# Patient Record
Sex: Male | Born: 1935 | Race: Black or African American | Hispanic: No | Marital: Married | Smoking: Former smoker
Health system: Southern US, Community
[De-identification: ages and names within clinical notes are randomized; demographics above are authoritative.]

## PROBLEM LIST (undated history)

## (undated) DIAGNOSIS — R972 Elevated prostate specific antigen [PSA]: Secondary | ICD-10-CM

## (undated) DIAGNOSIS — N400 Enlarged prostate without lower urinary tract symptoms: Secondary | ICD-10-CM

## (undated) DIAGNOSIS — N289 Disorder of kidney and ureter, unspecified: Secondary | ICD-10-CM

## (undated) DIAGNOSIS — I10 Essential (primary) hypertension: Secondary | ICD-10-CM

## (undated) DIAGNOSIS — H9190 Unspecified hearing loss, unspecified ear: Secondary | ICD-10-CM

## (undated) DIAGNOSIS — R9431 Abnormal electrocardiogram [ECG] [EKG]: Secondary | ICD-10-CM

## (undated) DIAGNOSIS — E785 Hyperlipidemia, unspecified: Secondary | ICD-10-CM

## (undated) DIAGNOSIS — I2510 Atherosclerotic heart disease of native coronary artery without angina pectoris: Secondary | ICD-10-CM

## (undated) DIAGNOSIS — Z0000 Encounter for general adult medical examination without abnormal findings: Secondary | ICD-10-CM

## (undated) HISTORY — PX: BLADDER SURGERY: SHX569

## (undated) HISTORY — PX: CATARACT EXTRACTION: SUR2

## (undated) HISTORY — DX: Unspecified hearing loss, unspecified ear: H91.90

## (undated) HISTORY — PX: TRANSURETHRAL RESECTION OF PROSTATE: SHX73

## (undated) HISTORY — PX: SHOULDER SURGERY: SHX246

## (undated) HISTORY — PX: COLONOSCOPY: SHX174

## (undated) HISTORY — DX: Elevated prostate specific antigen (PSA): R97.20

## (undated) HISTORY — DX: Abnormal electrocardiogram (ECG) (EKG): R94.31

## (undated) HISTORY — DX: Encounter for general adult medical examination without abnormal findings: Z00.00

## (undated) HISTORY — DX: Hyperlipidemia, unspecified: E78.5

## (undated) HISTORY — PX: TONSILECTOMY, ADENOIDECTOMY, BILATERAL MYRINGOTOMY AND TUBES: SHX2538

## (undated) HISTORY — DX: Benign prostatic hyperplasia without lower urinary tract symptoms: N40.0

## (undated) HISTORY — DX: Disorder of kidney and ureter, unspecified: N28.9

## (undated) HISTORY — PX: OTHER SURGICAL HISTORY: SHX169

## (undated) HISTORY — DX: Atherosclerotic heart disease of native coronary artery without angina pectoris: I25.10

## (undated) HISTORY — DX: Essential (primary) hypertension: I10

---

## 1998-11-14 ENCOUNTER — Other Ambulatory Visit: Admission: RE | Admit: 1998-11-14 | Discharge: 1998-11-14 | Payer: Self-pay | Admitting: Urology

## 1999-01-30 ENCOUNTER — Encounter: Payer: Self-pay | Admitting: Urology

## 1999-01-30 ENCOUNTER — Inpatient Hospital Stay (HOSPITAL_COMMUNITY): Admission: EM | Admit: 1999-01-30 | Discharge: 1999-02-02 | Payer: Self-pay | Admitting: Emergency Medicine

## 1999-02-06 ENCOUNTER — Ambulatory Visit (HOSPITAL_COMMUNITY): Admission: RE | Admit: 1999-02-06 | Discharge: 1999-02-06 | Payer: Self-pay | Admitting: Urology

## 2000-09-09 ENCOUNTER — Other Ambulatory Visit: Admission: RE | Admit: 2000-09-09 | Discharge: 2000-09-09 | Payer: Self-pay | Admitting: Urology

## 2000-09-09 ENCOUNTER — Encounter (INDEPENDENT_AMBULATORY_CARE_PROVIDER_SITE_OTHER): Payer: Self-pay | Admitting: Specialist

## 2000-12-28 ENCOUNTER — Encounter: Payer: Self-pay | Admitting: Cardiology

## 2000-12-28 ENCOUNTER — Ambulatory Visit (HOSPITAL_COMMUNITY): Admission: RE | Admit: 2000-12-28 | Discharge: 2000-12-28 | Payer: Self-pay | Admitting: Cardiology

## 2004-03-18 ENCOUNTER — Inpatient Hospital Stay (HOSPITAL_COMMUNITY): Admission: AD | Admit: 2004-03-18 | Discharge: 2004-03-20 | Payer: Self-pay | Admitting: Cardiology

## 2004-03-19 ENCOUNTER — Encounter: Payer: Self-pay | Admitting: Cardiology

## 2004-04-07 ENCOUNTER — Encounter: Admission: RE | Admit: 2004-04-07 | Discharge: 2004-04-07 | Payer: Self-pay | Admitting: Family Medicine

## 2004-11-06 ENCOUNTER — Ambulatory Visit: Payer: Self-pay | Admitting: Family Medicine

## 2004-11-24 ENCOUNTER — Ambulatory Visit: Payer: Self-pay | Admitting: Family Medicine

## 2004-12-08 ENCOUNTER — Ambulatory Visit: Payer: Self-pay | Admitting: Family Medicine

## 2004-12-22 ENCOUNTER — Ambulatory Visit: Payer: Self-pay | Admitting: Family Medicine

## 2005-01-05 ENCOUNTER — Ambulatory Visit: Payer: Self-pay | Admitting: Family Medicine

## 2005-01-12 ENCOUNTER — Ambulatory Visit: Payer: Self-pay | Admitting: Family Medicine

## 2005-01-25 ENCOUNTER — Ambulatory Visit: Payer: Self-pay | Admitting: Family Medicine

## 2005-02-11 ENCOUNTER — Ambulatory Visit: Payer: Self-pay | Admitting: Family Medicine

## 2005-06-15 ENCOUNTER — Ambulatory Visit: Payer: Self-pay | Admitting: Family Medicine

## 2005-06-29 ENCOUNTER — Ambulatory Visit: Payer: Self-pay | Admitting: Family Medicine

## 2005-08-24 ENCOUNTER — Ambulatory Visit: Payer: Self-pay | Admitting: Internal Medicine

## 2006-01-09 ENCOUNTER — Emergency Department (HOSPITAL_COMMUNITY): Admission: EM | Admit: 2006-01-09 | Discharge: 2006-01-10 | Payer: Self-pay | Admitting: Emergency Medicine

## 2006-01-09 ENCOUNTER — Ambulatory Visit: Payer: Self-pay | Admitting: Internal Medicine

## 2006-01-11 ENCOUNTER — Ambulatory Visit: Payer: Self-pay | Admitting: Family Medicine

## 2006-01-12 ENCOUNTER — Encounter: Payer: Self-pay | Admitting: Urology

## 2006-01-18 ENCOUNTER — Encounter: Admission: RE | Admit: 2006-01-18 | Discharge: 2006-01-18 | Payer: Self-pay | Admitting: Family Medicine

## 2006-01-25 ENCOUNTER — Ambulatory Visit: Payer: Self-pay | Admitting: Family Medicine

## 2006-02-02 ENCOUNTER — Ambulatory Visit: Payer: Self-pay | Admitting: Internal Medicine

## 2006-02-03 ENCOUNTER — Ambulatory Visit: Payer: Self-pay | Admitting: Cardiology

## 2006-02-14 ENCOUNTER — Ambulatory Visit: Payer: Self-pay

## 2006-04-27 ENCOUNTER — Ambulatory Visit: Payer: Self-pay | Admitting: Family Medicine

## 2006-05-18 ENCOUNTER — Ambulatory Visit: Payer: Self-pay | Admitting: Family Medicine

## 2006-06-01 ENCOUNTER — Ambulatory Visit: Payer: Self-pay | Admitting: Family Medicine

## 2006-08-30 ENCOUNTER — Ambulatory Visit: Payer: Self-pay | Admitting: Family Medicine

## 2006-09-20 ENCOUNTER — Ambulatory Visit: Payer: Self-pay | Admitting: Family Medicine

## 2006-10-11 ENCOUNTER — Ambulatory Visit: Payer: Self-pay | Admitting: Family Medicine

## 2006-10-11 LAB — CONVERTED CEMR LAB
ALT: 12 units/L (ref 0–40)
Albumin: 3.3 g/dL — ABNORMAL LOW (ref 3.5–5.2)
BUN: 14 mg/dL (ref 6–23)
Calcium: 8.8 mg/dL (ref 8.4–10.5)
Creatinine, Ser: 1.4 mg/dL (ref 0.4–1.5)
GFR calc non Af Amer: 53 mL/min
Sodium: 140 meq/L (ref 135–145)
Total Protein: 7.1 g/dL (ref 6.0–8.3)

## 2006-10-18 ENCOUNTER — Ambulatory Visit: Payer: Self-pay | Admitting: Family Medicine

## 2006-10-18 LAB — CONVERTED CEMR LAB
Calcium: 9.1 mg/dL (ref 8.4–10.5)
Chloride: 109 meq/L (ref 96–112)
Creatinine, Ser: 1.4 mg/dL (ref 0.4–1.5)
GFR calc Af Amer: 64 mL/min
GFR calc non Af Amer: 53 mL/min
Hgb A1c MFr Bld: 5.2 % (ref 4.6–6.0)
Sodium: 141 meq/L (ref 135–145)

## 2006-12-08 DIAGNOSIS — I10 Essential (primary) hypertension: Secondary | ICD-10-CM | POA: Insufficient documentation

## 2006-12-08 DIAGNOSIS — IMO0002 Reserved for concepts with insufficient information to code with codable children: Secondary | ICD-10-CM | POA: Insufficient documentation

## 2006-12-08 DIAGNOSIS — H9190 Unspecified hearing loss, unspecified ear: Secondary | ICD-10-CM | POA: Insufficient documentation

## 2006-12-08 DIAGNOSIS — Z9189 Other specified personal risk factors, not elsewhere classified: Secondary | ICD-10-CM

## 2006-12-08 DIAGNOSIS — E785 Hyperlipidemia, unspecified: Secondary | ICD-10-CM

## 2006-12-08 DIAGNOSIS — N400 Enlarged prostate without lower urinary tract symptoms: Secondary | ICD-10-CM

## 2006-12-08 HISTORY — DX: Benign prostatic hyperplasia without lower urinary tract symptoms: N40.0

## 2006-12-08 HISTORY — DX: Unspecified hearing loss, unspecified ear: H91.90

## 2006-12-08 HISTORY — DX: Hyperlipidemia, unspecified: E78.5

## 2006-12-08 HISTORY — DX: Essential (primary) hypertension: I10

## 2006-12-29 ENCOUNTER — Encounter: Payer: Self-pay | Admitting: Family Medicine

## 2006-12-29 ENCOUNTER — Ambulatory Visit: Payer: Self-pay | Admitting: Family Medicine

## 2006-12-29 DIAGNOSIS — H409 Unspecified glaucoma: Secondary | ICD-10-CM | POA: Insufficient documentation

## 2006-12-29 HISTORY — DX: Unspecified glaucoma: H40.9

## 2006-12-29 LAB — CONVERTED CEMR LAB
ALT: 19 units/L (ref 0–40)
Albumin: 3.6 g/dL (ref 3.5–5.2)
Alkaline Phosphatase: 71 units/L (ref 39–117)
Basophils Relative: 0.9 % (ref 0.0–1.0)
Chloride: 106 meq/L (ref 96–112)
Cholesterol: 169 mg/dL (ref 0–200)
Direct LDL: 64.2 mg/dL
Eosinophils Absolute: 0.1 10*3/uL (ref 0.0–0.6)
Eosinophils Relative: 1.6 % (ref 0.0–5.0)
GFR calc Af Amer: 77 mL/min
GFR calc non Af Amer: 63 mL/min
Glucose, Bld: 99 mg/dL (ref 70–99)
HCT: 38.4 % — ABNORMAL LOW (ref 39.0–52.0)
Lymphocytes Relative: 25.7 % (ref 12.0–46.0)
Monocytes Absolute: 0.7 10*3/uL (ref 0.2–0.7)
Neutro Abs: 5.4 10*3/uL (ref 1.4–7.7)
Platelets: 251 10*3/uL (ref 150–400)
RBC: 4.25 M/uL (ref 4.22–5.81)
RDW: 12.5 % (ref 11.5–14.6)
TSH: 1.57 microintl units/mL (ref 0.35–5.50)
Total Bilirubin: 1.1 mg/dL (ref 0.3–1.2)
Total Protein: 7.5 g/dL (ref 6.0–8.3)
VLDL: 77 mg/dL — ABNORMAL HIGH (ref 0–40)

## 2007-01-02 ENCOUNTER — Encounter: Payer: Self-pay | Admitting: Family Medicine

## 2007-01-05 ENCOUNTER — Ambulatory Visit: Payer: Self-pay | Admitting: Family Medicine

## 2007-01-13 ENCOUNTER — Ambulatory Visit: Payer: Self-pay | Admitting: Cardiology

## 2007-01-20 ENCOUNTER — Ambulatory Visit: Payer: Self-pay

## 2007-01-30 DIAGNOSIS — Z8719 Personal history of other diseases of the digestive system: Secondary | ICD-10-CM | POA: Insufficient documentation

## 2007-04-04 ENCOUNTER — Ambulatory Visit: Payer: Self-pay | Admitting: Family Medicine

## 2007-04-07 DIAGNOSIS — N259 Disorder resulting from impaired renal tubular function, unspecified: Secondary | ICD-10-CM

## 2007-04-07 HISTORY — DX: Disorder resulting from impaired renal tubular function, unspecified: N25.9

## 2007-04-07 LAB — CONVERTED CEMR LAB
ALT: 12 units/L (ref 0–53)
Albumin: 3.8 g/dL (ref 3.5–5.2)
CO2: 24 meq/L (ref 19–32)
Cholesterol: 157 mg/dL (ref 0–200)
GFR calc Af Amer: 40 mL/min
GFR calc non Af Amer: 33 mL/min
Glucose, Bld: 96 mg/dL (ref 70–99)
LDL Cholesterol: 103 mg/dL — ABNORMAL HIGH (ref 0–99)
Potassium: 4 meq/L (ref 3.5–5.1)
Total Bilirubin: 1 mg/dL (ref 0.3–1.2)
Total Protein: 7.4 g/dL (ref 6.0–8.3)
VLDL: 25 mg/dL (ref 0–40)

## 2007-04-14 ENCOUNTER — Telehealth (INDEPENDENT_AMBULATORY_CARE_PROVIDER_SITE_OTHER): Payer: Self-pay | Admitting: *Deleted

## 2007-04-21 ENCOUNTER — Ambulatory Visit: Payer: Self-pay | Admitting: Family Medicine

## 2007-04-25 LAB — CONVERTED CEMR LAB
Chloride: 107 meq/L (ref 96–112)
Creatinine, Ser: 2.33 mg/dL — ABNORMAL HIGH (ref 0.40–1.50)
Sodium: 141 meq/L (ref 135–145)

## 2007-05-05 ENCOUNTER — Ambulatory Visit: Payer: Self-pay | Admitting: Family Medicine

## 2007-05-17 ENCOUNTER — Encounter (INDEPENDENT_AMBULATORY_CARE_PROVIDER_SITE_OTHER): Payer: Self-pay | Admitting: *Deleted

## 2007-06-08 ENCOUNTER — Encounter: Payer: Self-pay | Admitting: Family Medicine

## 2007-06-30 ENCOUNTER — Ambulatory Visit: Payer: Self-pay | Admitting: Family Medicine

## 2007-07-30 ENCOUNTER — Emergency Department (HOSPITAL_COMMUNITY): Admission: EM | Admit: 2007-07-30 | Discharge: 2007-07-31 | Payer: Self-pay | Admitting: Emergency Medicine

## 2007-08-03 ENCOUNTER — Ambulatory Visit: Payer: Self-pay | Admitting: Family Medicine

## 2007-10-09 ENCOUNTER — Ambulatory Visit: Payer: Self-pay | Admitting: Family Medicine

## 2007-10-17 LAB — CONVERTED CEMR LAB
ALT: 11 units/L (ref 0–53)
Alkaline Phosphatase: 37 units/L — ABNORMAL LOW (ref 39–117)
Bilirubin, Direct: 0.1 mg/dL (ref 0.0–0.3)
Direct LDL: 170.2 mg/dL
Total Protein: 7.5 g/dL (ref 6.0–8.3)

## 2007-12-18 ENCOUNTER — Telehealth (INDEPENDENT_AMBULATORY_CARE_PROVIDER_SITE_OTHER): Payer: Self-pay | Admitting: *Deleted

## 2008-01-15 ENCOUNTER — Ambulatory Visit: Payer: Self-pay | Admitting: Family Medicine

## 2008-01-15 LAB — CONVERTED CEMR LAB
ALT: 13 units/L (ref 0–53)
AST: 19 units/L (ref 0–37)
Alkaline Phosphatase: 40 units/L (ref 39–117)
Bilirubin, Direct: 0.1 mg/dL (ref 0.0–0.3)
Cholesterol: 141 mg/dL (ref 0–200)
HDL: 32.6 mg/dL — ABNORMAL LOW (ref >39.0)
LDL Cholesterol: 93 mg/dL (ref 0–99)
Total CHOL/HDL Ratio: 4.3
Triglycerides: 79 mg/dL (ref 0–149)
VLDL: 16 mg/dL (ref 0–40)

## 2008-01-19 ENCOUNTER — Encounter: Payer: Self-pay | Admitting: Internal Medicine

## 2008-01-23 ENCOUNTER — Encounter (INDEPENDENT_AMBULATORY_CARE_PROVIDER_SITE_OTHER): Payer: Self-pay | Admitting: *Deleted

## 2008-01-31 ENCOUNTER — Ambulatory Visit: Payer: Self-pay | Admitting: Cardiology

## 2008-04-29 ENCOUNTER — Ambulatory Visit: Payer: Self-pay | Admitting: Family Medicine

## 2008-04-29 DIAGNOSIS — M25559 Pain in unspecified hip: Secondary | ICD-10-CM | POA: Insufficient documentation

## 2008-04-29 HISTORY — DX: Pain in unspecified hip: M25.559

## 2008-05-03 ENCOUNTER — Ambulatory Visit: Payer: Self-pay | Admitting: Vascular Surgery

## 2008-05-03 ENCOUNTER — Encounter (INDEPENDENT_AMBULATORY_CARE_PROVIDER_SITE_OTHER): Payer: Self-pay | Admitting: Emergency Medicine

## 2008-05-03 ENCOUNTER — Emergency Department (HOSPITAL_COMMUNITY): Admission: EM | Admit: 2008-05-03 | Discharge: 2008-05-03 | Payer: Self-pay | Admitting: Emergency Medicine

## 2008-05-11 LAB — CONVERTED CEMR LAB
Albumin: 4.2 g/dL (ref 3.5–5.2)
Alkaline Phosphatase: 38 units/L — ABNORMAL LOW (ref 39–117)
BUN: 27 mg/dL — ABNORMAL HIGH (ref 6–23)
Bilirubin, Direct: 0.1 mg/dL (ref 0.0–0.3)
CO2: 24 meq/L (ref 19–32)
Calcium: 9.3 mg/dL (ref 8.4–10.5)
Chloride: 110 meq/L (ref 96–112)
Creatinine, Ser: 1.6 mg/dL — ABNORMAL HIGH (ref 0.4–1.5)
GFR calc Af Amer: 55 mL/min
GFR calc non Af Amer: 45 mL/min
LDL Cholesterol: 87 mg/dL (ref 0–99)
Potassium: 3.8 meq/L (ref 3.5–5.1)
Total Bilirubin: 0.9 mg/dL (ref 0.3–1.2)

## 2008-05-13 ENCOUNTER — Encounter (INDEPENDENT_AMBULATORY_CARE_PROVIDER_SITE_OTHER): Payer: Self-pay | Admitting: *Deleted

## 2008-05-16 ENCOUNTER — Ambulatory Visit: Payer: Self-pay | Admitting: Family Medicine

## 2008-06-06 ENCOUNTER — Ambulatory Visit: Payer: Self-pay | Admitting: Family Medicine

## 2008-06-18 ENCOUNTER — Telehealth (INDEPENDENT_AMBULATORY_CARE_PROVIDER_SITE_OTHER): Payer: Self-pay | Admitting: *Deleted

## 2008-06-19 ENCOUNTER — Ambulatory Visit: Payer: Self-pay | Admitting: Family Medicine

## 2008-06-19 DIAGNOSIS — R634 Abnormal weight loss: Secondary | ICD-10-CM | POA: Insufficient documentation

## 2008-06-19 HISTORY — DX: Abnormal weight loss: R63.4

## 2008-06-19 LAB — CONVERTED CEMR LAB
Bilirubin Urine: NEGATIVE
Nitrite: NEGATIVE
Specific Gravity, Urine: 1.025
WBC Urine, dipstick: NEGATIVE
pH: 5

## 2008-06-27 LAB — CONVERTED CEMR LAB
Basophils Absolute: 0 10*3/uL (ref 0.0–0.1)
CO2: 26 meq/L (ref 19–32)
Chloride: 110 meq/L (ref 96–112)
Eosinophils Absolute: 0.2 10*3/uL (ref 0.0–0.7)
Eosinophils Relative: 2.2 % (ref 0.0–5.0)
Glucose, Bld: 117 mg/dL — ABNORMAL HIGH (ref 70–99)
Hemoglobin: 12.8 g/dL — ABNORMAL LOW (ref 13.0–17.0)
MCHC: 34.5 g/dL (ref 30.0–36.0)
Monocytes Absolute: 0.8 10*3/uL (ref 0.1–1.0)
Neutro Abs: 6.9 10*3/uL (ref 1.4–7.7)
Neutrophils Relative %: 68.3 % (ref 43.0–77.0)
Potassium: 4 meq/L (ref 3.5–5.1)
RBC: 3.99 M/uL — ABNORMAL LOW (ref 4.22–5.81)
RDW: 12.8 % (ref 11.5–14.6)
TSH: 1.99 microintl units/mL (ref 0.35–5.50)
Vitamin B-12: 395 pg/mL (ref 211–911)

## 2008-06-28 ENCOUNTER — Telehealth (INDEPENDENT_AMBULATORY_CARE_PROVIDER_SITE_OTHER): Payer: Self-pay | Admitting: *Deleted

## 2008-06-28 ENCOUNTER — Encounter (INDEPENDENT_AMBULATORY_CARE_PROVIDER_SITE_OTHER): Payer: Self-pay | Admitting: *Deleted

## 2008-08-02 ENCOUNTER — Ambulatory Visit: Payer: Self-pay | Admitting: Family Medicine

## 2008-09-09 ENCOUNTER — Telehealth (INDEPENDENT_AMBULATORY_CARE_PROVIDER_SITE_OTHER): Payer: Self-pay | Admitting: *Deleted

## 2008-09-10 ENCOUNTER — Telehealth (INDEPENDENT_AMBULATORY_CARE_PROVIDER_SITE_OTHER): Payer: Self-pay | Admitting: *Deleted

## 2008-09-18 ENCOUNTER — Telehealth (INDEPENDENT_AMBULATORY_CARE_PROVIDER_SITE_OTHER): Payer: Self-pay | Admitting: *Deleted

## 2008-11-01 ENCOUNTER — Ambulatory Visit: Payer: Self-pay | Admitting: Family Medicine

## 2008-11-08 ENCOUNTER — Ambulatory Visit: Payer: Self-pay | Admitting: Family Medicine

## 2008-11-14 ENCOUNTER — Ambulatory Visit: Payer: Self-pay | Admitting: Family Medicine

## 2008-12-03 LAB — CONVERTED CEMR LAB
ALT: 9 units/L (ref 0–53)
Albumin: 3.4 g/dL — ABNORMAL LOW (ref 3.5–5.2)
Calcium: 9.3 mg/dL (ref 8.4–10.5)
Chloride: 109 meq/L (ref 96–112)
Cholesterol: 140 mg/dL (ref 0–200)
Creatinine, Ser: 1.9 mg/dL — ABNORMAL HIGH (ref 0.4–1.5)
GFR calc Af Amer: 45 mL/min
GFR calc non Af Amer: 37 mL/min
HDL: 32.5 mg/dL — ABNORMAL LOW (ref >39.0)
Potassium: 3.8 meq/L (ref 3.5–5.1)
Triglycerides: 106 mg/dL (ref 0–149)
VLDL: 21 mg/dL (ref 0–40)

## 2008-12-04 ENCOUNTER — Encounter (INDEPENDENT_AMBULATORY_CARE_PROVIDER_SITE_OTHER): Payer: Self-pay | Admitting: *Deleted

## 2009-04-10 ENCOUNTER — Ambulatory Visit: Payer: Self-pay | Admitting: Cardiology

## 2009-04-10 DIAGNOSIS — R9431 Abnormal electrocardiogram [ECG] [EKG]: Secondary | ICD-10-CM

## 2009-04-10 HISTORY — DX: Abnormal electrocardiogram (ECG) (EKG): R94.31

## 2009-04-15 ENCOUNTER — Telehealth (INDEPENDENT_AMBULATORY_CARE_PROVIDER_SITE_OTHER): Payer: Self-pay | Admitting: *Deleted

## 2009-04-16 ENCOUNTER — Encounter: Payer: Self-pay | Admitting: Cardiology

## 2009-04-16 ENCOUNTER — Ambulatory Visit: Payer: Self-pay

## 2009-04-21 ENCOUNTER — Encounter (INDEPENDENT_AMBULATORY_CARE_PROVIDER_SITE_OTHER): Payer: Self-pay | Admitting: *Deleted

## 2009-05-02 ENCOUNTER — Ambulatory Visit: Payer: Self-pay | Admitting: Family Medicine

## 2009-05-07 ENCOUNTER — Ambulatory Visit: Payer: Self-pay | Admitting: Family Medicine

## 2009-05-12 LAB — CONVERTED CEMR LAB
ALT: 12 units/L (ref 0–53)
AST: 21 units/L (ref 0–37)
Alkaline Phosphatase: 36 units/L — ABNORMAL LOW (ref 39–117)
Bilirubin, Direct: 0 mg/dL (ref 0.0–0.3)
CO2: 27 meq/L (ref 19–32)
Chloride: 113 meq/L — ABNORMAL HIGH (ref 96–112)
LDL Cholesterol: 102 mg/dL — ABNORMAL HIGH (ref 0–99)
Total Bilirubin: 0.8 mg/dL (ref 0.3–1.2)
Total CHOL/HDL Ratio: 4
Total Protein: 7.7 g/dL (ref 6.0–8.3)
Triglycerides: 98 mg/dL (ref 0.0–149.0)

## 2009-08-27 ENCOUNTER — Telehealth (INDEPENDENT_AMBULATORY_CARE_PROVIDER_SITE_OTHER): Payer: Self-pay | Admitting: *Deleted

## 2009-09-01 ENCOUNTER — Telehealth (INDEPENDENT_AMBULATORY_CARE_PROVIDER_SITE_OTHER): Payer: Self-pay | Admitting: *Deleted

## 2009-09-09 ENCOUNTER — Telehealth (INDEPENDENT_AMBULATORY_CARE_PROVIDER_SITE_OTHER): Payer: Self-pay | Admitting: *Deleted

## 2009-09-22 ENCOUNTER — Telehealth (INDEPENDENT_AMBULATORY_CARE_PROVIDER_SITE_OTHER): Payer: Self-pay | Admitting: *Deleted

## 2009-10-06 ENCOUNTER — Telehealth (INDEPENDENT_AMBULATORY_CARE_PROVIDER_SITE_OTHER): Payer: Self-pay | Admitting: *Deleted

## 2009-10-24 ENCOUNTER — Telehealth (INDEPENDENT_AMBULATORY_CARE_PROVIDER_SITE_OTHER): Payer: Self-pay | Admitting: *Deleted

## 2009-11-06 ENCOUNTER — Telehealth (INDEPENDENT_AMBULATORY_CARE_PROVIDER_SITE_OTHER): Payer: Self-pay | Admitting: *Deleted

## 2009-11-24 ENCOUNTER — Ambulatory Visit: Payer: Self-pay | Admitting: Family Medicine

## 2009-11-24 DIAGNOSIS — D239 Other benign neoplasm of skin, unspecified: Secondary | ICD-10-CM | POA: Insufficient documentation

## 2009-11-26 ENCOUNTER — Ambulatory Visit: Payer: Self-pay | Admitting: Family Medicine

## 2009-12-06 DIAGNOSIS — R972 Elevated prostate specific antigen [PSA]: Secondary | ICD-10-CM | POA: Insufficient documentation

## 2009-12-08 ENCOUNTER — Telehealth (INDEPENDENT_AMBULATORY_CARE_PROVIDER_SITE_OTHER): Payer: Self-pay | Admitting: *Deleted

## 2009-12-08 LAB — CONVERTED CEMR LAB
ALT: 12 units/L (ref 0–53)
AST: 20 units/L (ref 0–37)
Basophils Relative: 0.7 % (ref 0.0–3.0)
Bilirubin, Direct: 0.1 mg/dL (ref 0.0–0.3)
CO2: 26 meq/L (ref 19–32)
Chloride: 114 meq/L — ABNORMAL HIGH (ref 96–112)
Cholesterol: 144 mg/dL (ref 0–200)
Creatinine, Ser: 1.5 mg/dL (ref 0.4–1.5)
GFR calc non Af Amer: 58.54 mL/min (ref >60)
Glucose, Bld: 101 mg/dL — ABNORMAL HIGH (ref 70–99)
HCT: 39.5 % (ref 39.0–52.0)
HDL: 45.3 mg/dL (ref >39.00)
LDL Cholesterol: 74 mg/dL (ref 0–99)
Monocytes Absolute: 0.8 10*3/uL (ref 0.1–1.0)
Monocytes Relative: 10.2 % (ref 3.0–12.0)
PSA: 10.02 ng/mL — ABNORMAL HIGH (ref 0.10–4.00)
Total Protein: 8.2 g/dL (ref 6.0–8.3)
Triglycerides: 124 mg/dL (ref 0.0–149.0)
VLDL: 24.8 mg/dL (ref 0.0–40.0)
WBC: 7.6 10*3/uL (ref 4.5–10.5)

## 2009-12-16 ENCOUNTER — Encounter: Payer: Self-pay | Admitting: Family Medicine

## 2010-01-13 ENCOUNTER — Emergency Department (HOSPITAL_COMMUNITY): Admission: EM | Admit: 2010-01-13 | Discharge: 2010-01-13 | Payer: Self-pay | Admitting: Emergency Medicine

## 2010-03-13 HISTORY — PX: CHOLECYSTECTOMY: SHX55

## 2010-04-07 DIAGNOSIS — I2510 Atherosclerotic heart disease of native coronary artery without angina pectoris: Secondary | ICD-10-CM | POA: Insufficient documentation

## 2010-04-07 HISTORY — DX: Atherosclerotic heart disease of native coronary artery without angina pectoris: I25.10

## 2010-04-09 ENCOUNTER — Ambulatory Visit: Payer: Self-pay | Admitting: Cardiology

## 2010-05-19 ENCOUNTER — Ambulatory Visit: Payer: Self-pay | Admitting: Family Medicine

## 2010-05-19 DIAGNOSIS — R109 Unspecified abdominal pain: Secondary | ICD-10-CM | POA: Insufficient documentation

## 2010-05-19 DIAGNOSIS — R143 Flatulence: Secondary | ICD-10-CM

## 2010-05-19 DIAGNOSIS — R141 Gas pain: Secondary | ICD-10-CM

## 2010-05-19 DIAGNOSIS — R142 Eructation: Secondary | ICD-10-CM

## 2010-05-19 HISTORY — DX: Unspecified abdominal pain: R10.9

## 2010-05-19 LAB — CONVERTED CEMR LAB
Glucose, Urine, Semiquant: NEGATIVE
Nitrite: NEGATIVE
Protein, U semiquant: 100
WBC Urine, dipstick: NEGATIVE
pH: 5

## 2010-05-20 ENCOUNTER — Encounter: Payer: Self-pay | Admitting: Family Medicine

## 2010-05-20 ENCOUNTER — Encounter (INDEPENDENT_AMBULATORY_CARE_PROVIDER_SITE_OTHER): Payer: Self-pay | Admitting: *Deleted

## 2010-05-20 LAB — CONVERTED CEMR LAB
AST: 17 units/L (ref 0–37)
Alkaline Phosphatase: 31 units/L — ABNORMAL LOW (ref 39–117)
Amylase: 63 units/L (ref 27–131)
Bilirubin, Direct: 0.2 mg/dL (ref 0.0–0.3)
Calcium: 8.8 mg/dL (ref 8.4–10.5)
Creatinine, Ser: 1.7 mg/dL — ABNORMAL HIGH (ref 0.4–1.5)
GFR calc non Af Amer: 49.92 mL/min (ref >60)
Glucose, Bld: 75 mg/dL (ref 70–99)
HCT: 34.4 % — ABNORMAL LOW (ref 39.0–52.0)
Hemoglobin: 11.9 g/dL — ABNORMAL LOW (ref 13.0–17.0)
MCHC: 34.5 g/dL (ref 30.0–36.0)
Monocytes Absolute: 0.7 10*3/uL (ref 0.1–1.0)
Monocytes Relative: 8.4 % (ref 3.0–12.0)
Neutrophils Relative %: 65.6 % (ref 43.0–77.0)
Platelets: 259 10*3/uL (ref 150.0–400.0)

## 2010-05-22 ENCOUNTER — Encounter: Admission: RE | Admit: 2010-05-22 | Discharge: 2010-05-22 | Payer: Self-pay | Admitting: Family Medicine

## 2010-05-22 DIAGNOSIS — K810 Acute cholecystitis: Secondary | ICD-10-CM

## 2010-05-22 HISTORY — DX: Acute cholecystitis: K81.0

## 2010-05-25 ENCOUNTER — Ambulatory Visit: Payer: Self-pay | Admitting: Family Medicine

## 2010-05-26 ENCOUNTER — Telehealth: Payer: Self-pay | Admitting: Cardiology

## 2010-05-26 ENCOUNTER — Encounter: Payer: Self-pay | Admitting: Family Medicine

## 2010-05-28 LAB — CONVERTED CEMR LAB
BUN: 21 mg/dL (ref 6–23)
Calcium: 9.5 mg/dL (ref 8.4–10.5)
Chloride: 108 meq/L (ref 96–112)
Creatinine, Ser: 1.7 mg/dL — ABNORMAL HIGH (ref 0.4–1.5)
GFR calc non Af Amer: 51.29 mL/min (ref >60)

## 2010-05-29 ENCOUNTER — Encounter: Payer: Self-pay | Admitting: Cardiology

## 2010-06-23 ENCOUNTER — Encounter: Payer: Self-pay | Admitting: Family Medicine

## 2010-06-26 ENCOUNTER — Telehealth (INDEPENDENT_AMBULATORY_CARE_PROVIDER_SITE_OTHER): Payer: Self-pay | Admitting: *Deleted

## 2010-06-30 ENCOUNTER — Encounter (INDEPENDENT_AMBULATORY_CARE_PROVIDER_SITE_OTHER): Payer: Self-pay | Admitting: General Surgery

## 2010-06-30 ENCOUNTER — Ambulatory Visit (HOSPITAL_COMMUNITY): Admission: RE | Admit: 2010-06-30 | Discharge: 2010-07-01 | Payer: Self-pay | Admitting: General Surgery

## 2010-07-21 ENCOUNTER — Encounter: Payer: Self-pay | Admitting: Family Medicine

## 2010-10-13 NOTE — Consult Note (Signed)
Summary: [facility]   Imported By: Lanelle Bal 12/23/2009 10:58:53  _____________________________________________________________________  External Attachment:    Type:   Image     Comment:   External Document

## 2010-10-13 NOTE — Progress Notes (Signed)
Summary: REFILL  Phone Note Refill Request Message from:  Fax from Pharmacy on [address] FAX [phone]  Refills Requested: Medication #1:  CARDIZEM LA 420 MG  TB24 1 by mouth once daily [BMN] Initial call taken by: Barb Merino,  September 22, 2009 12:16 PM    Prescriptions: CARDIZEM LA 420 MG  TB24 (DILTIAZEM HCL COATED BEADS) 1 by mouth once daily Brand medically necessary #30 Each x 0   Entered by:   Kandice Hams   Authorized by:   Loreen Freud DO   Signed by:   Kandice Hams on 09/22/2009   Method used:   Faxed to ...       [facility]* (retail)       [address] [place], [place]  [address]       Ph: [phone]       Fax: [phone]   RxID:   [id number]

## 2010-10-13 NOTE — Letter (Signed)
Summary: Clearance Letter  [facility], Main Office  [address]   [place], [place] [address]   Phone: [phone]  Fax: [phone]    May 29, 2010  Re:     [name] Address:   [address] [place], [place]  [address] DOB:     18-Jun-1932 MRN:     [id number]   Dear [name]  [name] is cleared from a cardiac standpoint for surgery.  He will need to stop his Coumadin 5 days prior to surgery. For any questions please feel free to contact us at [phone].         Sincerely,     Dr. Valera Castle Lisabeth Devoid RN

## 2010-10-13 NOTE — Progress Notes (Signed)
Summary: REFILL  Phone Note Refill Request Message from:  Fax from Pharmacy on November 06, 2009 12:55 PM  Refills Requested: Medication #1:  CLONIDINE HCL 0.1 MG TABS 1 by mouth two times a day [facility] FAX [phone]   Method Requested: Fax to Local Pharmacy Next Appointment Scheduled: NO APPT Initial call taken by: Barb Merino,  November 06, 2009 1:00 PM    New/Updated Medications: CLONIDINE HCL 0.1 MG TABS (CLONIDINE HCL) 1 by mouth two times a day. NEEDS OFFICE VISIT BEFORE ADDITONAL REFILLS. Prescriptions: CLONIDINE HCL 0.1 MG TABS (CLONIDINE HCL) 1 by mouth two times a day. NEEDS OFFICE VISIT BEFORE ADDITONAL REFILLS.  #60 x 0   Entered by:   Army Fossa CMA   Authorized by:   Loreen Freud DO   Signed by:   Army Fossa CMA on 11/06/2009   Method used:   Electronically to        [facility]* (retail)       [address] [place], [place]  [address]       Ph: [phone]       Fax: [phone]   RxID:   [id number]

## 2010-10-13 NOTE — Progress Notes (Signed)
Summary: REFILL  Phone Note Refill Request Message from:  Fax from Pharmacy on [address] G[address] [phone]  Refills Requested: Medication #1:  ANTARA 130 MG  CAPS 1 by mouth once daily Initial call taken by: Barb Merino,  October 06, 2009 9:16 AM    Prescriptions: Lestine Mount 130 MG  CAPS (FENOFIBRATE MICRONIZED) 1 by mouth once daily  #30 Each x 1   Entered by:   Army Fossa CMA   Authorized by:   Loreen Freud DO   Signed by:   Army Fossa CMA on 10/06/2009   Method used:   Electronically to        [facility] [address] (retail)       [phone]       [place], [place]  [address]       Ph: [phone]       Fax: [phone]   RxID:   [id number]

## 2010-10-13 NOTE — Assessment & Plan Note (Signed)
Summary: yearly/sl  Medications Added ASPIRIN 81 MG TBEC (ASPIRIN) as needed pt states he takes ASA q 2 months PREDNISOLONE ACETATE 1 % SUSP (PREDNISOLONE ACETATE) 1 gtt ou once daily        Visit Type:  1 yr f/u Primary Provider:  Laury Axon  CC:  pt states he recently had some bilateral arm pain this persisted on and off x3 weeks...no other complaints today.  History of Present Illness: [name] [name] comes in today for followup of his abnormal EKG, nonobstructive coronary disease, and chest discomfort.  We performed a stress Myoview in August of 2010. EF was 62% with no sign of scar or ischemia. He is profoundly abnormal EKG which shows ST segment changes with T wave inversion in almost every lead.  Assessment quit pain that lasts a few seconds in the center of his chest. It does not sound cardiac.  He remains active without any significant dyspnea on exertion or shortness of breath. Blood pressure is doing good. He is compliant with his medications.  Current Medications (verified): 1)  Clonidine Hcl 0.1 Mg Tabs (Clonidine Hcl) .Marland Kitchen.. 1 By Mouth Two Times A Day. Needs Office Visit Before Additonal Refills. 2)  Cardizem La 420 Mg  Tb24 (Diltiazem Hcl Coated Beads) .Marland Kitchen.. 1 By Mouth Once Daily. Needs Office Visit. 3)  Toprol Xl 50 Mg Tb24 (Metoprolol Succinate) .... Take 1 Tablet By Mouth Once A Day 4)  Antara 130 Mg  Caps (Fenofibrate Micronized) .Marland Kitchen.. 1 By Mouth Once Daily 5)  Crestor 20 Mg  Tabs (Rosuvastatin Calcium) .... Take One Tablet At Bedtime- 6)  Cialis 20 Mg Tabs (Tadalafil) .... As Directed 7)  Aspirin 81 Mg Tbec (Aspirin) .... As Needed Pt States He Takes Asa Q 2 Months 8)  Dorzolamide-Timolol 2-0.5 % Soln (Dorzolamide-Timolol) .Marland Kitchen.. 1 Drop in Each Eye Two Times A Day 9)  Flexeril 10 Mg Tabs (Cyclobenzaprine Hcl) .... Take 1 By Mouth Three Times A Day As Needed 10)  Prednisolone Acetate 1 % Susp (Prednisolone Acetate) .Marland Kitchen.. 1 Gtt Ou Once Daily  Allergies: 1)  * Propoxyphene  Group  Past History:  Past Medical History: Last updated: 04/07/2010 CAD, NATIVE VESSEL (ICD-414.01) PSA, INCREASED (ICD-790.93) MOLE (ICD-216.9) ABNORMAL EKG (ICD-794.31) RENAL INSUFFICIENCY (ICD-588.9) HYPERTENSION (ICD-401.9) HYPERLIPIDEMIA (ICD-272.4) WEIGHT LOSS (ICD-783.21) HIP PAIN, LEFT (ICD-719.45) COLONOSCOPY, HX OF (ICD-V12.79) PREVENTIVE HEALTH CARE (ICD-V70.0) FAMILY HISTORY DIABETES 1ST DEGREE RELATIVE (ICD-V18.0) FAMILY HISTORY OF CAD MALE 1ST DEGREE RELATIVE <60 (ICD-V16.49) FAMILY HISTORY BREAST CANCER 1ST DEGREE RELATIVE <50 (ICD-V16.3) HX, PERSONAL, HEALTH HAZARD NOS,  PROSTATE BIOPSY (ICD-V15.9) GLAUCOMA NOS (ICD-365.9) ABRASION, LOWER ARM W/INFECTION (ICD-913.1) HX, PERSONAL, HEALTH HAZARD NOS (ICD-V15.9) HX, PERSONAL, HEALTH HAZARD NOS (ICD-V15.9) HEARING IMPAIRMENT (ICD-389.9) BENIGN PROSTATIC HYPERTROPHY (ICD-600.00)  Past Surgical History: Last updated: 01/30/2007 PROSTATE BIOSPY  Family History: Last updated: 12/29/2006 Family History Breast cancer 1st degree relative <50 Family History of CAD Male 1st degree relative <60,  mother Family History Diabetes 1st degree relative Family History Hypertension  Social History: Last updated: 12/29/2006 Retired Married Former Smoker Alcohol use-no Drug use-no Regular exercise-yes  Risk Factors: Caffeine Use: 1 (11/24/2009) Exercise: yes (11/24/2009)  Risk Factors: Smoking Status: quit (11/24/2009) Passive Smoke Exposure: no (11/24/2009)  Review of Systems       negative other than history of present illness  Vital Signs:  Patient profile:   77 year old male Height:      69 inches Weight:      178 pounds BMI:     26.38 Pulse rate:  45 / minute Pulse rhythm:   irregular BP sitting:   118 / 70  (left arm) Cuff size:   large  Vitals Entered By: Danielle Rankin, CMA (April 09, 2010 10:15 AM)  Physical Exam  General:  elderly, no acute distress Head:  normocephalic and  atraumatic Eyes:  PERRLA/EOM intact; conjunctiva and lids normal. Mouth:  poor dentition.   Neck:  Neck supple, no JVD. No masses, thyromegaly or abnormal cervical nodes. Lungs:  Clear bilaterally to auscultation and percussion. Heart:  PMI nondisplaced, regular rate and rhythm, no gallop Msk:  decreased ROM.   Pulses:  pulses normal in all 4 extremities Extremities:  No clubbing or cyanosis. Neurologic:  Alert and oriented x 3. Skin:  Intact without lesions or rashes. Psych:  Normal affect.   EKG  Procedure date:  04/09/2010  Findings:      sinus bradycardia, ST segment changes with T-wave inversion, stable.  Impression & Recommendations:  Problem # 1:  CAD, NATIVE VESSEL (ICD-414.01) Assessment Unchanged Stable. Low risk Myoview with no typical symptoms. Symptoms reviewed with patient. He refuses to carry nitroglycerin. We'll see him back in a year. The following medications were removed from the medication list:    Nitroglycerin 0.4 Mg Subl (Nitroglycerin) ..... One tablet under tongue every 5 minutes as needed for chest pain---may repeat times three His updated medication list for this problem includes:    Cardizem La 420 Mg Tb24 (Diltiazem hcl coated beads) .Marland Kitchen... 1 by mouth once daily. needs office visit.    Toprol Xl 50 Mg Tb24 (Metoprolol succinate) .Marland Kitchen... Take 1 tablet by mouth once a day    Aspirin 81 Mg Tbec (Aspirin) .Marland Kitchen... As needed pt states he takes asa q 2 months  Orders: EKG w/ Interpretation (93000)  Problem # 2:  ABNORMAL EKG (ICD-794.31) Assessment: Unchanged  The following medications were removed from the medication list:    Nitroglycerin 0.4 Mg Subl (Nitroglycerin) ..... One tablet under tongue every 5 minutes as needed for chest pain---may repeat times three His updated medication list for this problem includes:    Cardizem La 420 Mg Tb24 (Diltiazem hcl coated beads) .Marland Kitchen... 1 by mouth once daily. needs office visit.    Toprol Xl 50 Mg Tb24 (Metoprolol  succinate) .Marland Kitchen... Take 1 tablet by mouth once a day    Aspirin 81 Mg Tbec (Aspirin) .Marland Kitchen... As needed pt states he takes asa q 2 months  Problem # 3:  HYPERTENSION (ICD-401.9)  His updated medication list for this problem includes:    Clonidine Hcl 0.1 Mg Tabs (Clonidine hcl) .Marland Kitchen... 1 by mouth two times a day. needs office visit before additonal refills.    Cardizem La 420 Mg Tb24 (Diltiazem hcl coated beads) .Marland Kitchen... 1 by mouth once daily. needs office visit.    Toprol Xl 50 Mg Tb24 (Metoprolol succinate) .Marland Kitchen... Take 1 tablet by mouth once a day    Aspirin 81 Mg Tbec (Aspirin) .Marland Kitchen... As needed pt states he takes asa q 2 months  Problem # 4:  HYPERLIPIDEMIA (ICD-272.4)  His updated medication list for this problem includes:    Antara 130 Mg Caps (Fenofibrate micronized) .Marland Kitchen... 1 by mouth once daily    Crestor 20 Mg Tabs (Rosuvastatin calcium) .Marland Kitchen... Take one tablet at bedtime-  Patient Instructions: 1)  Your physician recommends that you schedule a follow-up appointment in: YEAR WITH DR Jla Reynolds 2)  Your physician recommends that you continue on your current medications as directed. Please refer to the Current Medication list  given to you today.

## 2010-10-13 NOTE — Assessment & Plan Note (Signed)
Summary: STOMACH PAIN/KN   Vital Signs:  Patient profile:   77 year old male Height:      69 inches Weight:      178.6 pounds Temp:     98.6 degrees F oral BP sitting:   116 / 62  (left arm)  Vitals Entered By: Almeta Monas CMA Duncan Dull) (May 19, 2010 11:14 AM) CC: C/o Abdominal pain and left sided pain that moved to the right last night.   History of Present Illness: Pt here c/o abd pain again.  He states his BMs have been dark --he thought he might have been constipated but he took pepto 1x with no difference. pain is generalized.  No NVD.     Current Medications (verified): 1)  Clonidine Hcl 0.1 Mg Tabs (Clonidine Hcl) .Marland Kitchen.. 1 By Mouth Two Times A Day. Needs Office Visit Before Additonal Refills. 2)  Cardizem La 420 Mg  Tb24 (Diltiazem Hcl Coated Beads) .Marland Kitchen.. 1 By Mouth Once Daily. Needs Office Visit. 3)  Toprol Xl 50 Mg Tb24 (Metoprolol Succinate) .... Take 1 Tablet By Mouth Once A Day 4)  Antara 130 Mg  Caps (Fenofibrate Micronized) .Marland Kitchen.. 1 By Mouth Once Daily 5)  Crestor 20 Mg  Tabs (Rosuvastatin Calcium) .... Take One Tablet At Bedtime- 6)  Cialis 20 Mg Tabs (Tadalafil) .... As Directed 7)  Aspirin 81 Mg Tbec (Aspirin) .... As Needed Pt States He Takes Asa Q 2 Months 8)  Dorzolamide-Timolol 2-0.5 % Soln (Dorzolamide-Timolol) .Marland Kitchen.. 1 Drop in Each Eye Two Times A Day 9)  Flexeril 10 Mg Tabs (Cyclobenzaprine Hcl) .... Take 1 By Mouth Three Times A Day As Needed 10)  Prednisolone Acetate 1 % Susp (Prednisolone Acetate) .Marland Kitchen.. 1 Gtt Ou Once Daily 11)  Nexium 40 Mg Cpdr (Esomeprazole Magnesium) .Marland Kitchen.. 1 By Mouth Once Daily  Allergies (verified): 1)  * Propoxyphene Group  Past History:  Past medical, surgical, family and social histories (including risk factors) reviewed for relevance to current acute and chronic problems.  Past Medical History: Reviewed history from 04/07/2010 and no changes required. CAD, NATIVE VESSEL (ICD-414.01) PSA, INCREASED (ICD-790.93) MOLE  (ICD-216.9) ABNORMAL EKG (ICD-794.31) RENAL INSUFFICIENCY (ICD-588.9) HYPERTENSION (ICD-401.9) HYPERLIPIDEMIA (ICD-272.4) WEIGHT LOSS (ICD-783.21) HIP PAIN, LEFT (ICD-719.45) COLONOSCOPY, HX OF (ICD-V12.79) PREVENTIVE HEALTH CARE (ICD-V70.0) FAMILY HISTORY DIABETES 1ST DEGREE RELATIVE (ICD-V18.0) FAMILY HISTORY OF CAD MALE 1ST DEGREE RELATIVE <60 (ICD-V16.49) FAMILY HISTORY BREAST CANCER 1ST DEGREE RELATIVE <50 (ICD-V16.3) HX, PERSONAL, HEALTH HAZARD NOS,  PROSTATE BIOPSY (ICD-V15.9) GLAUCOMA NOS (ICD-365.9) ABRASION, LOWER ARM W/INFECTION (ICD-913.1) HX, PERSONAL, HEALTH HAZARD NOS (ICD-V15.9) HX, PERSONAL, HEALTH HAZARD NOS (ICD-V15.9) HEARING IMPAIRMENT (ICD-389.9) BENIGN PROSTATIC HYPERTROPHY (ICD-600.00)  Past Surgical History: Reviewed history from 01/30/2007 and no changes required. PROSTATE BIOSPY  Family History: Reviewed history from 12/29/2006 and no changes required. Family History Breast cancer 1st degree relative <50 Family History of CAD Male 1st degree relative <60,  mother Family History Diabetes 1st degree relative Family History Hypertension  Social History: Reviewed history from 12/29/2006 and no changes required. Retired Married Former Smoker Alcohol use-no Drug use-no Regular exercise-yes  Review of Systems      See HPI  Physical Exam  General:  Well-developed,well-nourished,in no acute distress; alert,appropriate and cooperative throughout examination Neck:  No deformities, masses, or tenderness noted. Lungs:  Normal respiratory effort, chest expands symmetrically. Lungs are clear to auscultation, no crackles or wheezes. Heart:  Normal rate and regular rhythm. S1 and S2 normal without gallop, murmur, click, rub or other extra sounds. Abdomen:  +  distended soft, non-tender, and normal bowel sounds.   Rectal:  No external abnormalities noted. Normal sphincter tone. No rectal masses or tenderness. Prostate:  Prostate gland firm and smooth,  no enlargement, nodularity, tenderness, mass, asymmetry or induration. Skin:  Intact without suspicious lesions or rashes Cervical Nodes:  No lymphadenopathy noted Psych:  Cognition and judgment appear intact. Alert and cooperative with normal attention span and concentration. No apparent delusions, illusions, hallucinations   Impression & Recommendations:  Problem # 1:  ABDOMINAL BLOATING (ICD-787.3)  Orders: Specimen Handling (11914) Gastroenterology Referral (GI)  Problem # 2:  ABDOMINAL PAIN OTHER SPECIFIED SITE (ICD-789.09)  His updated medication list for this problem includes:    Aspirin 81 Mg Tbec (Aspirin) .Marland Kitchen... As needed pt states he takes asa q 2 months    Flexeril 10 Mg Tabs (Cyclobenzaprine hcl) .Marland Kitchen... Take 1 by mouth three times a day as needed  Orders: T-Culture, Urine (78295-62130) Venipuncture (86578) TLB-BMP (Basic Metabolic Panel-BMET) (80048-METABOL) TLB-CBC Platelet - w/Differential (85025-CBCD) TLB-Hepatic/Liver Function Pnl (80076-HEPATIC) TLB-TSH (Thyroid Stimulating Hormone) (84443-TSH) TLB-Amylase (82150-AMYL) TLB-Lipase (83690-LIPASE) Radiology Referral (Radiology)  Discussed use of medications, application of heat or cold, and exercises.   Complete Medication List: 1)  Clonidine Hcl 0.1 Mg Tabs (Clonidine hcl) .Marland Kitchen.. 1 by mouth two times a day. needs office visit before additonal refills. 2)  Cardizem La 420 Mg Tb24 (Diltiazem hcl coated beads) .Marland Kitchen.. 1 by mouth once daily. needs office visit. 3)  Toprol Xl 50 Mg Tb24 (Metoprolol succinate) .... Take 1 tablet by mouth once a day 4)  Antara 130 Mg Caps (Fenofibrate micronized) .Marland Kitchen.. 1 by mouth once daily 5)  Crestor 20 Mg Tabs (Rosuvastatin calcium) .... Take one tablet at bedtime- 6)  Cialis 20 Mg Tabs (Tadalafil) .... As directed 7)  Aspirin 81 Mg Tbec (Aspirin) .... As needed pt states he takes asa q 2 months 8)  Dorzolamide-timolol 2-0.5 % Soln (Dorzolamide-timolol) .Marland Kitchen.. 1 drop in each eye two times a  day 9)  Flexeril 10 Mg Tabs (Cyclobenzaprine hcl) .... Take 1 by mouth three times a day as needed 10)  Prednisolone Acetate 1 % Susp (Prednisolone acetate) .Marland Kitchen.. 1 gtt ou once daily 11)  Nexium 40 Mg Cpdr (Esomeprazole magnesium) .Marland Kitchen.. 1 by mouth once daily  Laboratory Results   Urine Tests    Routine Urinalysis   Color: orange Appearance: Cloudy Glucose: negative   (Normal Range: Negative) Bilirubin: moderate   (Normal Range: Negative) Ketone: moderate (40)   (Normal Range: Negative) Spec. Gravity: 1.020   (Normal Range: 1.003-1.035) Blood: negative   (Normal Range: Negative) pH: 5.0   (Normal Range: 5.0-8.0) Protein: 100   (Normal Range: Negative) Urobilinogen: 0.2   (Normal Range: 0-1) Nitrite: negative   (Normal Range: Negative) Leukocyte Esterace: negative   (Normal Range: Negative)

## 2010-10-13 NOTE — Assessment & Plan Note (Signed)
Summary: 6 month roa//lch   Vital Signs:  Patient profile:   77 year old male Weight:      181 pounds Pulse rate:   62 / minute Pulse rhythm:   regular BP sitting:   124 / 80  (left arm) Cuff size:   large  Vitals Entered By: Army Fossa CMA (November 24, 2009 10:28 AM) CC: Pt here for 6 month roa- doing well he states.   History of Present Illness:  Hypertension follow-up      This is a 77 year old man who presents for Hypertension follow-up.  The patient denies lightheadedness, urinary frequency, headaches, edema, impotence, rash, and fatigue.  The patient denies the following associated symptoms: chest pain, chest pressure, exercise intolerance, dyspnea, palpitations, syncope, leg edema, and pedal edema.  Compliance with medications (by patient report) has been near 100%.  The patient reports that dietary compliance has been good.  The patient reports exercising 3-4X per week.  Adjunctive measures currently used by the patient include salt restriction.    Hyperlipidemia follow-up      The patient also presents for Hyperlipidemia follow-up.  The patient denies muscle aches, GI upset, abdominal pain, flushing, itching, constipation, diarrhea, and fatigue.  The patient denies the following symptoms: chest pain/pressure, exercise intolerance, dypsnea, palpitations, syncope, and pedal edema.  Compliance with medications (by patient report) has been near 100%.  Dietary compliance has been good.  The patient reports exercising 3-4X per week.  Adjunctive measures currently used by the patient include ASA.    Preventive Screening-Counseling & Management  Alcohol-Tobacco     Smoking Status: quit     Year Quit: 1998     Passive Smoke Exposure: no  Caffeine-Diet-Exercise     Caffeine use/day: 1     Does Patient Exercise: yes     Type of exercise: stepper and bike     Exercise (avg: min/session): 4:00     Times/week: 5  Current Medications (verified): 1)  Clonidine Hcl 0.1 Mg Tabs  (Clonidine Hcl) .Marland Kitchen.. 1 By Mouth Two Times A Day. Needs Office Visit Before Additonal Refills. 2)  Cardizem La 420 Mg  Tb24 (Diltiazem Hcl Coated Beads) .Marland Kitchen.. 1 By Mouth Once Daily. Needs Office Visit. 3)  Toprol Xl 50 Mg Tb24 (Metoprolol Succinate) .... Take 1 Tablet By Mouth Once A Day 4)  Antara 130 Mg  Caps (Fenofibrate Micronized) .Marland Kitchen.. 1 By Mouth Once Daily 5)  Crestor 20 Mg  Tabs (Rosuvastatin Calcium) .... Take One Tablet At Bedtime- 6)  Cialis 20 Mg Tabs (Tadalafil) .... As Directed 7)  Nitroglycerin 0.4 Mg Subl (Nitroglycerin) .... One Tablet Under Tongue Every 5 Minutes As Needed For Chest Pain---May Repeat Times Three 8)  Aspirin 81 Mg Tbec (Aspirin) .... Take One Tablet By Mouth Daily 9)  Dorzolamide-Timolol 2-0.5 % Soln (Dorzolamide-Timolol) .Marland Kitchen.. 1 Drop in Each Eye Two Times A Day 10)  Flexeril 10 Mg Tabs (Cyclobenzaprine Hcl) .... Take 1 By Mouth Three Times A Day As Needed  Allergies: 1)  * Propoxyphene Group  Past History:  Past medical, surgical, family and social histories (including risk factors) reviewed for relevance to current acute and chronic problems.  Past Medical History: Reviewed history from 03/29/2009 and no changes required. CAD, UNSPECIFIED SITE/NONOBSTRUCTIVE (ICD-414.00) RENAL INSUFFICIENCY (ICD-588.9) HYPERTENSION (ICD-401.9) HYPERLIPIDEMIA (ICD-272.4) WEIGHT LOSS (ICD-783.21) HIP PAIN, LEFT (ICD-719.45) COLONOSCOPY, HX OF (ICD-V12.79) PREVENTIVE HEALTH CARE (ICD-V70.0) FAMILY HISTORY DIABETES 1ST DEGREE RELATIVE (ICD-V18.0) FAMILY HISTORY OF CAD MALE 1ST DEGREE RELATIVE <60 (ICD-V16.49) FAMILY HISTORY  BREAST CANCER 1ST DEGREE RELATIVE <50 (ICD-V16.3) HX, PERSONAL, HEALTH HAZARD NOS,  PROSTATE BIOPSY (ICD-V15.9) GLAUCOMA NOS (ICD-365.9) ABRASION, LOWER ARM W/INFECTION (ICD-913.1) HX, PERSONAL, HEALTH HAZARD NOS (ICD-V15.9) HX, PERSONAL, HEALTH HAZARD NOS (ICD-V15.9) HEARING IMPAIRMENT (ICD-389.9) BENIGN PROSTATIC HYPERTROPHY (ICD-600.00)     Past Surgical History: Reviewed history from 01/30/2007 and no changes required. PROSTATE BIOSPY  Family History: Reviewed history from 12/29/2006 and no changes required. Family History Breast cancer 1st degree relative <50 Family History of CAD Male 1st degree relative <60,  mother Family History Diabetes 1st degree relative Family History Hypertension  Social History: Reviewed history from 12/29/2006 and no changes required. Retired Married Former Smoker Alcohol use-no Drug use-no Regular exercise-yes  Review of Systems      See HPI  Physical Exam  General:  Well-developed,well-nourished,in no acute distress; alert,appropriate and cooperative throughout examination Lungs:  Normal respiratory effort, chest expands symmetrically. Lungs are clear to auscultation, no crackles or wheezes. Heart:  normal rate and no murmur.   Skin:  irregular mole L side forehead Cervical Nodes:  No lymphadenopathy noted Psych:  Cognition and judgment appear intact. Alert and cooperative with normal attention span and concentration. No apparent delusions, illusions, hallucinations   Impression & Recommendations:  Problem # 1:  HYPERTENSION (ICD-401.9)  His updated medication list for this problem includes:    Clonidine Hcl 0.1 Mg Tabs (Clonidine hcl) .Marland Kitchen... 1 by mouth two times a day. needs office visit before additonal refills.    Cardizem La 420 Mg Tb24 (Diltiazem hcl coated beads) .Marland Kitchen... 1 by mouth once daily. needs office visit.    Toprol Xl 50 Mg Tb24 (Metoprolol succinate) .Marland Kitchen... Take 1 tablet by mouth once a day  BP today: 124/80 Prior BP: 130/78 (05/02/2009)  Prior 10 Yr Risk Heart Disease: 27 % (04/29/2008)  Labs Reviewed: K+: 3.7 (05/07/2009) Creat: : 1.7 (05/07/2009)   Chol: 159 (05/07/2009)   HDL: 37.20 (05/07/2009)   LDL: 102 (05/07/2009)   TG: 98.0 (05/07/2009)  Problem # 2:  HYPERLIPIDEMIA (ICD-272.4)  His updated medication list for this problem includes:     Antara 130 Mg Caps (Fenofibrate micronized) .Marland Kitchen... 1 by mouth once daily    Crestor 20 Mg Tabs (Rosuvastatin calcium) .Marland Kitchen... Take one tablet at bedtime-  Labs Reviewed: SGOT: 21 (05/07/2009)   SGPT: 12 (05/07/2009)  Prior 10 Yr Risk Heart Disease: 27 % (04/29/2008)   HDL:37.20 (05/07/2009), 32.5 (11/14/2008)  LDL:102 (05/07/2009), 86 (09/81/1914)  Chol:159 (05/07/2009), 140 (11/14/2008)  Trig:98.0 (05/07/2009), 106 (11/14/2008)  Problem # 3:  MOLE (ICD-216.9) pt will go to Derm  Complete Medication List: 1)  Clonidine Hcl 0.1 Mg Tabs (Clonidine hcl) .Marland Kitchen.. 1 by mouth two times a day. needs office visit before additonal refills. 2)  Cardizem La 420 Mg Tb24 (Diltiazem hcl coated beads) .Marland Kitchen.. 1 by mouth once daily. needs office visit. 3)  Toprol Xl 50 Mg Tb24 (Metoprolol succinate) .... Take 1 tablet by mouth once a day 4)  Antara 130 Mg Caps (Fenofibrate micronized) .Marland Kitchen.. 1 by mouth once daily 5)  Crestor 20 Mg Tabs (Rosuvastatin calcium) .... Take one tablet at bedtime- 6)  Cialis 20 Mg Tabs (Tadalafil) .... As directed 7)  Nitroglycerin 0.4 Mg Subl (Nitroglycerin) .... One tablet under tongue every 5 minutes as needed for chest pain---may repeat times three 8)  Aspirin 81 Mg Tbec (Aspirin) .... Take one tablet by mouth daily 9)  Dorzolamide-timolol 2-0.5 % Soln (Dorzolamide-timolol) .Marland Kitchen.. 1 drop in each eye two times a day 10)  Flexeril  10 Mg Tabs (Cyclobenzaprine hcl) .... Take 1 by mouth three times a day as needed  Patient Instructions: 1)  rto fasting labs---272.4  401.9   bmp, hep, lipid, psa, cbc 2)  cpe 6 months Prescriptions: CRESTOR 20 MG  TABS (ROSUVASTATIN CALCIUM) TAKE ONE TABLET AT BEDTIME-  #30 x 5   Entered and Authorized by:   Loreen Freud DO   Signed by:   Loreen Freud DO on 11/24/2009   Method used:   Electronically to        [facility]* (retail)       [address] [place], [place]  [address]       Ph: [phone]       Fax:  [phone]   RxID:   [id number] CLONIDINE HCL 0.1 MG TABS (CLONIDINE HCL) 1 by mouth two times a day. NEEDS OFFICE VISIT BEFORE ADDITONAL REFILLS.  #60 x 5   Entered and Authorized by:   Loreen Freud DO   Signed by:   Loreen Freud DO on 11/24/2009   Method used:   Electronically to        [facility]* (retail)       [address] [place], [place]  [address]       Ph: [phone]       Fax: [phone]   RxID:   [id number] Lestine Mount 130 MG  CAPS (FENOFIBRATE MICRONIZED) 1 by mouth once daily  #30 Each x 5   Entered and Authorized by:   Loreen Freud DO   Signed by:   Loreen Freud DO on 11/24/2009   Method used:   Electronically to        [facility]* (retail)       [address] [place], [place]  [address]       Ph: [phone]       Fax: [phone]   RxID:   [id number] TOPROL XL 50 MG TB24 (METOPROLOL SUCCINATE) Take 1 tablet by mouth once a day  #30 Each x 5   Entered and Authorized by:   Loreen Freud DO   Signed by:   Loreen Freud DO on 11/24/2009   Method used:   Electronically to        [facility]* (retail)       [address] [place], [place]  [address]       Ph: [phone]       Fax: [phone]   RxID:   [id number] CARDIZEM LA 420 MG  TB24 (DILTIAZEM HCL COATED BEADS) 1 by mouth once daily. NEEDS OFFICE VISIT. Brand medically necessary #30 Each x 5   Entered and Authorized by:   Loreen Freud DO   Signed by:   Loreen Freud DO on 11/24/2009   Method used:   Electronically to        [facility]* (retail)       [address] [place], [place]  [address]       Ph: [phone]       Fax: [phone]   RxID:   [id number]

## 2010-10-13 NOTE — Consult Note (Signed)
Summary: [facility]   Imported By: Lanelle Bal 06/15/2010 08:13:25  _____________________________________________________________________  External Attachment:    Type:   Image     Comment:   External Document

## 2010-10-13 NOTE — Progress Notes (Signed)
Summary: Lab Results   Phone Note Outgoing Call   Call placed by: Army Fossa CMA,  December 08, 2009 3:50 PM Reason for Call: Discuss lab or test results Summary of Call: Regarding lab results, LMTCB:  PSA elevated---refer to urology   Follow-up for Phone Call        Pt is aware. Army Fossa CMA  December 10, 2009 1:17 PM

## 2010-10-13 NOTE — Letter (Signed)
Summary: [facility]   Imported By: Lanelle Bal 08/03/2010 13:40:30  _____________________________________________________________________  External Attachment:    Type:   Image     Comment:   External Document

## 2010-10-13 NOTE — Letter (Signed)
Summary: New Patient letter  [facility]  [address] [place], [place] [address]   Phone: [phone]  Fax: [phone]       05/20/2010 MRN: [id number]  [name] [name] [address] [place], [place]  [address]  Dear [name] [name],  Welcome to the Gastroenterology Division at [facility].    You are scheduled to see Dr.  Melvia Heaps on June 30, 2010 at 3:00pm on the 3rd floor at [facility], [address]  We ask that you try to arrive at our office 15 minutes prior to your appointment time to allow for check-in.  We would like you to complete the enclosed self-administered evaluation form prior to your visit and bring it with you on the day of your appointment.  We will review it with you.  Also, please bring a complete list of all your medications or, if you prefer, bring the medication bottles and we will list them.  Please bring your insurance card so that we may make a copy of it.  If your insurance requires a referral to see a specialist, please bring your referral form from your primary care physician.  Co-payments are due at the time of your visit and may be paid by cash, check or credit card.     Your office visit will consist of a consult with your physician (includes a physical exam), any laboratory testing he/she may order, scheduling of any necessary diagnostic testing (e.g. x-ray, ultrasound, CT-scan), and scheduling of a procedure (e.g. Endoscopy, Colonoscopy) if required.  Please allow enough time on your schedule to allow for any/all of these possibilities.    If you cannot keep your appointment, please call [phone] to cancel or reschedule prior to your appointment date.  This allows [place] the opportunity to schedule an appointment for another patient in need of care.  If you do not cancel or reschedule by 5 p.m. the business day prior to your appointment date, you will be charged a $50.00 late cancellation/no-show fee.    Thank you  for choosing  Gastroenterology for your medical needs.  We appreciate the opportunity to care for you.  Please visit [place] at our website  to learn more about our practice.                     Sincerely,                                                             The Gastroenterology Division

## 2010-10-13 NOTE — Consult Note (Signed)
Summary: [facility]  [facility]   Imported By: Lanelle Bal 07/01/2010 16:21:54  _____________________________________________________________________  External Attachment:    Type:   Image     Comment:   External Document

## 2010-10-13 NOTE — Progress Notes (Signed)
Summary: surgical clearance   Phone Note From Other Clinic   Caller: Nurse Summary of Call: Per annie [facility] pt needs clearance for Galbladder surgery. pt needs to stop coumadin or ASA 5 days prior to surgery. ofc [id number] fax [phone] Initial call taken by: Edman Circle,  May 26, 2010 2:47 PM  Follow-up for Phone Call        OK with me. Follow-up by: Gaylord Shih, MD, [facility],  May 26, 2010 3:55 PM     Appended Document: surgical clearance Surgical clearance letter faxed 05/29/10 at 2:20 pm Mylo Red RN

## 2010-10-13 NOTE — Progress Notes (Signed)
Summary: Phone-  Phone Note From Pharmacy   Caller: [facility] [address] Summary of Call: pharamcist called and stated that she did not receive rx. Gave verbal order to the pharmacist Initial call taken by: Barb Merino,  October 24, 2009 2:59 PM

## 2010-10-13 NOTE — Progress Notes (Signed)
Summary: Records Request   Faxed Echo (received from Dena) to Debbie at [facility] ([phone]). Debby Freiberg  June 26, 2010 3:16 PM

## 2010-10-30 ENCOUNTER — Telehealth (INDEPENDENT_AMBULATORY_CARE_PROVIDER_SITE_OTHER): Payer: Self-pay | Admitting: *Deleted

## 2010-11-04 NOTE — Progress Notes (Signed)
Summary: refill  Phone Note Refill Request   Refills Requested: Medication #1:  CARDIZEM LA 420 MG  TB24 1 by mouth once daily. [BMN] [facility] - fax [phone]  Initial call taken by: Okey Regal [place],  October 30, 2010 11:21 AM    New/Updated Medications: CARDIZEM LA 420 MG  TB24 (DILTIAZEM HCL COATED BEADS) 1 by mouth once daily.**OFFICE VISIT IS DUE NOW** [BMN] Prescriptions: CARDIZEM LA 420 MG  TB24 (DILTIAZEM HCL COATED BEADS) 1 by mouth once daily.**OFFICE VISIT IS DUE NOW** Brand medically necessary #30 x 0   Entered by:   Almeta Monas CMA (AAMA)   Authorized by:   Loreen Freud DO   Signed by:   Almeta Monas CMA (AAMA) on 10/30/2010   Method used:   Faxed to ...       [facility]* (retail)       [address] [place], [place]  [address]       Ph: [phone]       Fax: [phone]   RxID:   [id number]

## 2010-11-25 LAB — SURGICAL PCR SCREEN: MRSA, PCR: NEGATIVE

## 2010-11-25 LAB — COMPREHENSIVE METABOLIC PANEL
Albumin: 3.9 g/dL (ref 3.5–5.2)
Alkaline Phosphatase: 42 U/L (ref 39–117)
BUN: 16 mg/dL (ref 6–23)
Potassium: 3.7 mEq/L (ref 3.5–5.1)
Total Protein: 7.3 g/dL (ref 6.0–8.3)

## 2010-11-25 LAB — CBC
MCV: 90.8 fL (ref 78.0–100.0)
Platelets: 284 10*3/uL (ref 150–400)
RDW: 13.7 % (ref 11.5–15.5)
WBC: 8.6 10*3/uL (ref 4.0–10.5)

## 2010-12-01 LAB — URINALYSIS, ROUTINE W REFLEX MICROSCOPIC
Bilirubin Urine: NEGATIVE
Glucose, UA: NEGATIVE mg/dL
Hgb urine dipstick: NEGATIVE
Ketones, ur: NEGATIVE mg/dL
Protein, ur: NEGATIVE mg/dL
Urobilinogen, UA: 0.2 mg/dL (ref 0.0–1.0)

## 2010-12-01 LAB — CBC
HCT: 37.4 % — ABNORMAL LOW (ref 39.0–52.0)
MCHC: 32.9 g/dL (ref 30.0–36.0)
MCV: 95.1 fL (ref 78.0–100.0)
Platelets: 250 10*3/uL (ref 150–400)
RBC: 3.93 MIL/uL — ABNORMAL LOW (ref 4.22–5.81)
WBC: 9.8 10*3/uL (ref 4.0–10.5)

## 2010-12-01 LAB — BASIC METABOLIC PANEL
BUN: 22 mg/dL (ref 6–23)
CO2: 25 mEq/L (ref 19–32)
Chloride: 109 mEq/L (ref 96–112)
Creatinine, Ser: 1.57 mg/dL — ABNORMAL HIGH (ref 0.4–1.5)
GFR calc Af Amer: 52 mL/min — ABNORMAL LOW (ref >60)
Sodium: 141 mEq/L (ref 135–145)

## 2010-12-01 LAB — DIFFERENTIAL
Basophils Relative: 0 % (ref 0–1)
Eosinophils Absolute: 0.2 10*3/uL (ref 0.0–0.7)
Eosinophils Relative: 2 % (ref 0–5)
Lymphs Abs: 1.3 10*3/uL (ref 0.7–4.0)
Monocytes Relative: 4 % (ref 3–12)
Neutrophils Relative %: 81 % — ABNORMAL HIGH (ref 43–77)

## 2010-12-14 ENCOUNTER — Other Ambulatory Visit: Payer: Self-pay | Admitting: Family Medicine

## 2011-01-01 ENCOUNTER — Other Ambulatory Visit: Payer: Self-pay | Admitting: Family Medicine

## 2011-01-22 ENCOUNTER — Other Ambulatory Visit: Payer: Self-pay

## 2011-01-22 MED ORDER — METOPROLOL SUCCINATE ER 50 MG PO TB24: 50.0000 mg | ORAL_TABLET | Freq: Every day | ORAL | Status: DC

## 2011-01-26 NOTE — Assessment & Plan Note (Signed)
[name]                            CARDIOLOGY OFFICE NOTE   [name] [name] INGVALD THEISEN                      MRN:          [id number]  DATE:01/31/2008                            DOB:          1932/05/13    [name] [name] returns today for follow-up of his nonobstructive coronary  disease.  He is having no angina.  He denies orthopnea, PND, peripheral  edema, palpitations, presyncope or syncope.   He exercises every day.   He had mild left ventricular systolic dysfunction by 2-D echo in July  2005.  He has had an abnormal EKG for years.   His last stress Myoview showed EF improvement of 63%, no sign of scar or  ischemia.   His blood pressures have been under good control.  It was high in the  office today.  He was placed on Crestor recently for an LDL 170, and  this is being followed by Dr. Laury [name].  He says it is remarkably better,  though I do not have the labs today.   CURRENT MEDICATIONS:  1. Metoprolol Extended Release 50 mg a day.  2. Clonidine 0.3 mg p.o. b.i.d.  3. Acetazolamide 500 mg p.o. b.i.d.  4. Crestor 20 mg q.h.s.  5. Antara 130 mg a day.  6. Cardizem LA 420 mg q.h.s.   PHYSICAL EXAMINATION:  VITAL SIGNS:  Blood pressure 178/92, pulse 59 and  regular.  EKG shows sinus brady with ST-T wave changes which are  significantly improved since his last EKG.  Weight is 176.  GENERAL:  He is in no acute distress.  Skin is warm and dry.  Alert and  oriented x3.  HEENT:  Sclerae are muddy; otherwise, normal.  NECK:  Supple.  Carotid upstrokes were equal bilaterally without bruits,  no JVD.  Thyroid is not enlarged.  Trachea is midline.  LUNGS:  Clear.  HEART:  Reveals a nondisplaced PMI.  He has an S4 at the apex.  ABDOMEN:  Soft, good bowel sounds.  No midline bruit, no hepatomegaly.  EXTREMITIES:  No cyanosis, clubbing or edema.  Pulses are intact.  NEUROVASCULAR:  Intact.   [name] [name] is doing well.  I am delighted that his ejection fraction  has  improved, that his lipids are being aggressively treated by Dr. Laury [name],  and his electrocardiogram looks good today.  I have advised him to  continue with his regular exercise program.  I will see him back in a  year.     [name] C. Daleen Squibb, MD, [name] [name]  Electronically Signed    TCW/MedQ  DD: 01/31/2008  DT: 01/31/2008  Job #: [id number]   cc:   [name] Perla, DO

## 2011-01-26 NOTE — Assessment & Plan Note (Signed)
[facility]                            CARDIOLOGY OFFICE NOTE   Connar, Keating ITHIEL [name]                      MRN:          [id number]  DATE:01/13/2007                            DOB:          1932-04-05    [name] returns today for followup of his nonobstructive coronary  disease.  This was delineated by cath in July of 2005 for some abnormal  chest pain and abnormal EKG.  He has profound ST segment changes and T  wave inversion, which again today is stable.   His blood pressure has been a major problem, but Dr. Laury Axon, he says, has  it under control now.   He has mild left ventricular systolic dysfunction by 2D echo in July of  2005.  He does his daily yard work and other chores.  He is  asymptomatic.   MEDICATIONS:  1. Clonidine 0.3 b.i.d.  2. Travatan eye drops.  3. Cardizem LA 360 daily.  4. Lovastatin 40 mg nightly.  5. Metoprolol Extended Release 50 mg a day.  6. Alphagan drops.  7. Micardis 80 mg a day.  8. Cosopt eye drops.   EXAM:  Blood pressure today is 142/80, pulse is 50 and regular.  EKG  shows the traumatic ST segment changes with LVH unchanged.  His weight  is 182 and stable.  HEENT:  Normocephalic, atraumatic.  He is wearing glasses.  PERRLA.  Extraocular muscles are intact.  Sclerae clear.  Facial symmetry is  normal.  NECK:  Supple.  Carotid upstrokes are equal bilaterally without bruits.  No JVD.  Thyroid is not enlarged.  LUNGS:  Clear.  HEART:  Nondisplaced PMI.  He has an S4, slow rate, no murmur.  ABDOMEN:  Soft with good bowel sounds.  No midline bruit.  No  hepatomegaly.  EXTREMITIES:  No cyanosis, clubbing, or edema.  Pulses are present.  NEURO:  Nonfocal.   Recent labs show severe mixed hyperlipidemia with a triglyceride of 387,  HDL 30.6.  His LDL was not calculated.  His VLDL was 77.  Clearly,  dietary indiscretion.   ASSESSMENT AND PLAN:  [name] [name] is stable from a cardiovascular  standpoint.  He is in need  of an objective assessment of his coronary  disease.  We have arranged for him to have an  adenosine Myoview.  If we see any ischemia, he will need a repeat cath.  Otherwise, we will see him back in a year.     Thomas C. Daleen Squibb, MD, [facility]  Electronically Signed    TCW/MedQ  DD: 01/13/2007  DT: 01/13/2007  Job #: [id number]   cc:   Lelon Perla, DO

## 2011-01-28 ENCOUNTER — Other Ambulatory Visit: Payer: Self-pay | Admitting: Family Medicine

## 2011-01-29 NOTE — H&P (Signed)
NAME:  [name] [name], CLAUSEN NO.:  [id number]   MEDICAL RECORD NO.:  [id number]                   PATIENT TYPE:  INP   LOCATION:                                       FACILITY:  [facility]   PHYSICIAN:  Jesse Sans. Wall, M.D.                DATE OF BIRTH:  Jan 19, 1932   DATE OF ADMISSION:  03/18/2004  DATE OF DISCHARGE:                                HISTORY & PHYSICAL   CHIEF COMPLAINT:  Chest pain.   HISTORY OF PRESENT ILLNESS:  This is a 77 year old male referred by Dr.  Laury Axon for evaluation of chest pain and an abnormal EKG.  The patient does  have a history of hypertension.  He states that last Friday his heart began  to flutter and he felt very weak.  He had symptoms throughout the weekend.  His heart fluttering and weakness improved when he would sit very still;  however, it would return as soon as he got up to move about.  This was  associated with a nonproductive cough.  He denied any syncope, presyncope,  or dizziness.  He states that he occasionally would wake up at night with  chest pain.   The patient saw Dr. Laury Axon in the office on Tuesday.  He states that his  symptoms had resolved by then and he has not had any symptoms since that  time; however, his EKG was abnormal and he was sent for further cardiac  evaluation.  The patient tells me that he also had a chest x-ray at the  [facility] office that was abnormal with a question of a mass.  We do not  have that report at this time.   PAST MEDICAL HISTORY:  Significant for hypertension and elevated  cholesterol.  He has had some prostate problems.  He has seasonal allergies.  He has a history of gout.  He denies history of diabetes.   ALLERGIES:  No known drug allergies.  No dye allergy.   MEDICATIONS:  1. Protonix 40 mg p.r.n.  2. Benicar 40 mg daily.  3. Allegra 180 mg daily.  4. Clonidine 0.2 mg b.i.d.  5. Xalatan eye drops, dosage not available.  6. Spironolactone 25 mg p.r.n.  7. Toprol XL  100 mg daily.  8. Uroxatral 10 mg daily.   SOCIAL HISTORY:  The patient is married.  He lives in [place].  His wife  is with him today.  He has four children.  He quit smoking in 1997 and quit  alcohol in 1998.  He states he smoked heavily in the past, especially when  he was drinking.  He did drink to excess.  He is retired from [employer].   FAMILY HISTORY:  His mother died at age 44 from congestive heart failure.  His father died at age 67 from pneumonia and an MI.  He has a sister who  died  at age 80 from congestive heart failure.   REVIEW OF SYSTEMS:  Totally negative except for as noted above and the  following:  He had chills during a recent prostate infection.  He had  shortness of breath with the above noted episode of heart fluttering.  He  has a history of gout.  He had a recent prostate infection.  The remainder  of the review of systems is negative.   PHYSICAL EXAMINATION:  GENERAL:  A pleasant 77 year old African-American  male in no acute distress.  VITAL SIGNS:  Blood pressure 152/76, pulse 53.  HEENT:  Unremarkable.  NECK:  No bruits, no jugular venous distention.  CARDIAC:  Regular rate and rhythm with occasional ectopics.  No murmur  noted.  CHEST:  Lungs are clear.  ABDOMEN:  Slightly obese, soft, nontender.  EXTREMITIES:  No significant edema.  Pulses are intact.  SKIN:  Warm and dry.   LABORATORY DATA:  An EKG shows sinus bradycardia, rate 50 beats per minute,  with a question of sinus arrhythmia.  There are T-wave inversions V3-V6 as  well as I and aVL.   IMPRESSION:  1. Chest pain with abnormal electrocardiogram.  2. Recent heart fluttering, question paroxysmal atrial fibrillation.  3. Severe weakness associated with the heart fluttering.  4. Remote alcohol and tobacco history.  5. History of hypertension.  6. History of elevated cholesterol.  7. History of glaucoma.  8. Bradycardia.  9. Abnormal chest x-ray per patient history.    PLAN:  The patient will be admitted to [facility] today for cardiac  catheterization tomorrow.  We will plan to start him on aspirin as well as  heparin.  We will order an echo to further evaluate his ejection fraction.      Delton See, P.A. [facility]                  Thomas C. Wall, M.D.    DR/MEDQ  D:  03/18/2004  T:  03/18/2004  Job:  [id number]   cc:   Titus Dubin. Alwyn Ren, M.D. [facility]

## 2011-01-29 NOTE — Discharge Summary (Signed)
NAME:  [name] [name], [name] [name] NO.:  [id number]   MEDICAL RECORD NO.:  [id number]                   PATIENT TYPE:  INP   LOCATION:  2007                                 FACILITY:  [facility]   PHYSICIAN:  Jesse Sans. Wall, M.D.                DATE OF BIRTH:  1931/10/14   DATE OF ADMISSION:  03/18/2004  DATE OF DISCHARGE:                                 DISCHARGE SUMMARY   BRIEF HISTORY:  This is a 77 year old male referred to the [facility] office  and Dr. Daleen Squibb by Dr. Laury Axon for evaluation of chest pain and an abnormal EKG.  The patient had a previous history of hypertension.  He noticed a fluttering  sensation in his heart on the Friday prior to this admission.  He felt very  weak with this fluttering.  He had symptoms throughout the weekend.  He  stated whenever he would stand up or move about, his heart would flutter,  however, when he sat down, he felt better.  This was also associated with a  nonproductive cough  He denies any syncope, presyncope, or dizziness.  He  states that he would occasionally wake up at night with chest pain.  The  patient saw Dr. Laury Axon in the office on Tuesday.  The patient states his  symptoms had improved by then, however, his EKG was abnormal and he was  referred to Dr. Daleen Squibb for further evaluation.  The patient had a chest x-ray  at [facility] office that the patient reports showed some sort of mass, we  did not have the report at the time of admission.   PAST MEDICAL HISTORY:  Significant for hypertension and elevated cholesterol  levels.  He has a history of some prostate problems.  He has no allergies.  He has a history of gout.  He denies history of diabetes.   ALLERGIES:  No known drug allergies.  No dye allergies.   SOCIAL HISTORY:  The patient is married, he lives in [place], his wife  was with him on the day of admission, he has four children, he quit smoking  in 1997, he quit alcohol in 1998, he smoked heavily in the past,  especially  when he was drinking.  He occasionally would drink to excess.  He is retired  from the [employer] office.   FAMILY HISTORY:  His mother died at age 73 from congestive heart failure.  His father died at age 79 from pneumonia.  He also had an MI.  He had a  sister who died at age 37 from congestive heart failure.   HOSPITAL COURSE:  As noted, this patient was admitted to [facility]  for further evaluation of chest pain and fluttering sensation associated  with weakness.  The plan was for cardiac catheterization.  He was found to  have borderline renal insufficiency.  He was placed on the bicarb protocol.  His diuretic  was held and he was hydrated.  A 2D echo was performed on March 19, 2004, and this showed an ejection fraction of 45-50% with mild mitral  annular calcification.  The right ventricle was mildly dilated.  The right  atrium was mildly dilated.  The patient underwent cardiac catheterization on  March 20, 2004.  He was found to have mild to moderate coronary artery disease  which was not obstructive.  He was found to have normal left ventricular  function.  Arrangements were made to discharge the patient later that day.  He was to have an outpatient event monitor set up.  He was to have further  labs in the office.   LABORATORY DATA:  A chemistry profile on July 7 was within normal limits  except for a glucose of 105.  A lipid profile revealed cholesterol 179,  triglycerides 126, HDL 34, LDL 120.  CBC revealed hemoglobin 11.1,  hematocrit 31.3, WBC 7.5, platelets 195,000.  A chest x-ray showed no acute  disease, no CHF.  There were mild chronic peribronchial changes in the lower  lung zones with diffuse tortuosity and ectasia of the thoracic aorta.  There  was mild cardiomegaly.  This was similar to a previous study.  Hemoglobin  A1C was 5.  Cardiac enzymes were negative.  Liver function tests were within  normal limits.  A BMP was 162.   DISCHARGE MEDICATIONS:   Protonix 40 mg daily, Benacar 40 mg daily, Allegra  180 mg as previously taken, Clonidine 0.2 mg b.i.d., eyedrops as before,  Spironolactone was to be held for now, Toprol XL was decreased from 100 mg  daily to 50 mg daily due to bradycardia, UroXatral 10 mg daily, coated  aspirin 81 mg daily, nitroglycerin p.r.n. for chest pain, he was given  instructions on how to use nitroglycerin, Lipitor 20 mg at bedtime, Tylenol  as needed for pain.   DISCHARGE INSTRUCTIONS:  The patient was told to call the office if he had  any significant muscle pain or weakness.  He was to avoid any extraneous  activity or driving for two days, he was not to lift more than 10 pounds for  one week.  He was to be on a low salt low fat diet.  He was told to call the  office if he had any increased pain, swelling, or bleeding from his groin.  He was to have an outpatient event monitor, the office would call him to  schedule this.  He was to have the base metabolic panel at his next office  visit.  He was to have a lipid profile and liver tests in 6-8 weeks.   The patient is to follow up with Dr. Daleen Squibb on July 26 at 12 noon, Dr. Alwyn Ren  as needed or as scheduled.   PROBLEM LIST:  1. Chest pain, myocardial infarction ruled out, cardiac catheterization     performed March 20, 2004, revealing mild to moderate coronary artery     disease which was not obstructive with normal LV function to be treated     medically.  2. 2D echo performed this admission, please see results above, mildly     decreased ejection fraction by echo, 45-50%.  3. Recent heart fluttering, outpatient event monitor to be scheduled.  4. Remote alcohol and tobacco use.  5. History of hypertension.  6. Bradycardia, Toprol decreased.  7. History of dyslipidemia, Lipitor started.  8. History of glaucoma.  9. Abnormal chest x-ray per patient history,  however, chest x-ray here in    the hospital was unremarkable except for as noted above.      Delton See, P.A. [facility]                  Thomas C. Wall, M.D.    DR/MEDQ  D:  03/20/2004  T:  03/20/2004  Job:  [id number]   cc:   Titus Dubin. Alwyn Ren, M.D. [facility]

## 2011-01-29 NOTE — Consult Note (Signed)
NAME:  JERSEY, RAVENSCROFT NO.:  [id number]   MEDICAL RECORD NO.:  [id number]          PATIENT TYPE:  EMS   LOCATION:  MAJO                         FACILITY:  [facility]   PHYSICIAN:  Arvilla Meres, M.D. LHCDATE OF BIRTH:  1932-04-05   DATE OF CONSULTATION:  01/09/2006  DATE OF DISCHARGE:  01/10/2006                                   CONSULTATION   PRIMARY CARE PHYSICIAN:  Dr. Loreen Freud   CARDIOLOGIST:  Dr. Juanito Doom   REQUESTING PHYSICIAN:  Dr. Devoria Albe   REASON FOR CONSULTATION:  Abnormal ECG.   HISTORY OF PRESENT ILLNESS:  Mr. Sunga is a delightful 77 year old male with  a history of hypertension, hyperlipidemia and nonobstructive coronary artery  disease, as well as mild LV dysfunction with an EF of 45-50%.  According to  notes, he underwent cardiac catheterization in July2005 for atypical chest  pain and abnormal EKG which showed anterolateral T-wave inversion.  Catheterization by Dr. Gerri Spore at that time showed moderate nonobstructive  disease with a 50% LAD lesion and a 50% OM-1 lesion.  The RCA was normal.  Since that time he has been doing quite well without any chest pain or  shortness of breath.  He is very active around the house and able to mow the  grass and do all kinds of activities without any limitation.  Today, after  cutting his hair, he developed severe pain in the back of his head and  became acutely presyncopal.  There were no other focal neurologic symptoms.  His wife came home and took his  blood pressure a little bit later and his blood pressure was 217/119.  She  rechecked it a little bit later and it was then 222/130 so she brought him  to the emergency room.  EKG in the emergency room showed sinus bradycardia  with diffuse T-wave inversions.  Head CT was also performed which showed no  acute process.  We were consulted due to the abnormal EKG.  He had two point-  of-care troponins that were negative.   REVIEW OF SYSTEMS:   Unremarkable and negative except for HPI and problem  list.   PROBLEM LIST:  1.  Hypertension, severe.  2.  Hyperlipidemia.  3.  Mild to moderate nonobstructive coronary artery disease.  Cardiac      catheterization July 2005 for atypical chest pain and abnormal EKG      showed an ejection fraction of 60% with a 50% left anterior descending      coronary artery, 50% obtuse marginal one, and a normal right coronary      artery.  4.  Question of mild left ventricular dysfunction potentially due to      hypertension.  Echocardiogram in July 2005 showed an ejection fraction      of 45-50%.  Ejection fraction by catheterization in July 2005 was 60%.  5.  Glaucoma.  6.  History of alcohol and tobacco use, now quit.   CURRENT MEDICATIONS:  1.  Lovastatin 20 a day.  2.  Clonidine 0.2 b.i.d.  3.  Micardis 80 mg a day.  4.  Toprol-XL 25 a day.  5.  Eyedrops.   He does not take aspirin therapy.   ALLERGIES:  No known drug allergies.   SOCIAL HISTORY:  The patient lives in [place].  He is married.  He has  four children.  He quit smoking in 1997 and quit alcohol in 1998.  He is  retired from the [employer].   FAMILY HISTORY:  His mother died at age 20 from congestive heart failure.  Father died age 89 from pneumonia.  Father also a history of coronary artery  disease status post MI.  Sister died at age 75 from congestive heart  failure.   PHYSICAL EXAMINATION:  GENERAL:  He is well-appearing in no acute distress.  Respirations unlabored.  VITAL SIGNS:  Blood pressure initially 192/102, now it is 150/95, heart rate  is 58, he is saturating 99% on room air.  HEENT:  Sclerae anicteric.  EOMI.  There is no xanthelasmas.  Mucous  membranes are moist.  NECK:  Supple.  JVP is about 6 cmH2O  Carotids are 2+ bilaterally without  bruits.  There is no lymphadenopathy or thyromegaly.  CARDIAC:  He is bradycardic with a regular rhythm.  No obvious murmurs, rubs  or gallops.  LUNGS:   Clear.  ABDOMEN:  Soft, nontender, nondistended.  No hepatosplenomegaly, no bruits,  no masses, good bowel sounds.  There are no obvious renal bruits.  EXTREMITIES:  Warm and dry.  There is no cyanosis, clubbing or edema.  The  distal pulses are 2+ bilaterally.  NEUROLOGIC:  He is alert and oriented x3.  Cranial nerves II-XII are intact.  Moves all four extremities without difficulty.  There are no obvious focal  weakness.  Affect is delightful.   White count 6.7, hemoglobin 12.4 platelets are 195.  Sodium 141, potassium  4.2, chloride 109, bicarb of 16, creatinine 1.5.  Point-of-care troponin is  less than 0.05 x2.  EKG shows sinus bradycardia with diffuse T-wave  inversions inferiorly as well as anterolaterally.  Compared to the EKG in  June 2005 this shows sinus bradycardia with anterolateral T-wave inversions  but no changes inferiorly.   ASSESSMENT:  1.  Abnormal EKG with anterolateral T-wave inversion which is old but also      inferior T-wave inversion which is new since June2005.  2.  Nonobstructive coronary artery disease by catheterization in July2005.  3.  Hypertension, severe.  4.  Question mild left ventricular dysfunction, ejection fraction of 45-50%      by echocardiogram but 60% by catheterization.  5.  Severe headache with presyncope.  Head CT tonight is negative for acute      process.  6.  Hyperlipidemia.   PLAN/DISCUSSION:  In reviewing his EKGs it appears that the anterolateral T-  wave inversions are old; however, the inferior T-wave changes do appear new  since June2005.  However, I doubt these represent ischemia as his history is  not consistent with acute coronary syndrome and his point-of-care markers  are negative.  I question whether these are due to repolarization  abnormalities or related to a possible acute neurologic process.  At this  point, I would not recommend any further cardiac workup.  I would consider having  neurology evaluate him.  I have  also suggested him starting a baby aspirin a  day and he will need aggressive blood pressure control.  We will be happy to  follow him as an outpatient.      Arvilla Meres, M.D.  [facility]  Electronically Signed     DB/MEDQ  D:  01/09/2006  T:  01/10/2006  Job:  [id number]

## 2011-01-29 NOTE — Cardiovascular Report (Signed)
NAME:  [name] [name], [name] [name] NO.:  [id number]   MEDICAL RECORD NO.:  [id number]                   PATIENT TYPE:  INP   LOCATION:  2007                                 FACILITY:  [facility]   PHYSICIAN:  Carole Binning, M.D. [facility]         DATE OF BIRTH:  08-25-1932   DATE OF PROCEDURE:  03/20/2004  DATE OF DISCHARGE:                              CARDIAC CATHETERIZATION   PROCEDURES PERFORMED:  1. Left heart catheterization with,  2. Coronary angiography; and,  3. Left ventriculography.   CARDIOLOGIST:  Carole Binning, M.D.   INDICATIONS:  [name] is a 77 year old male who presented to the office  with symptoms of palpitations and somewhat atypical chest pain.  An EKG  showed anterolateral T-wave inversions.  He was admitted to the hospital for  cardiac catheterization.  Because of mild renal insufficiency he was treated  with bicarbonate protocol.  We also utilized Visipaque contrast.   PROCEDURAL NOTE:  A 6 French sheath was placed in the right femoral artery.  Coronary angiography was performed using 6 [place] JL-5 and JR-4 catheters.  Left ventriculography was performed with an angled pigtail catheter.   CONTRAST MATERIAL:  Visipaque.   COMPLICATIONS:  There were no complications.   RESULTS:   HEMODYNAMIC DATA:  Left ventricular pressure 176/12.  Aortic pressure  176/82.  There is no aortic valve gradient.   VENTRICULOGRAPHIC DATA:  Left Ventriculogram:  Wall motion is normal.  Ejection fraction is estimated at greater than or equal to 60%.  There is 1+  mild mitral regurgitation.   ARTERIOGRAPHIC DATA:  Coronary Arteriography (Codominant)  Left Main:  The left main is normal.   Left Anterior Descending Artery:  The left anterior descending artery has a  50% stenosis in the midvessel.  The LAD is otherwise relatively normal with  the exception of minor luminal irregularities.  The LAD gives rise to a  small diagonal branch.   Left  Circumflex:  The left circumflex gives rise to a very large first  obtuse marginal branch, which bifurcates and supplies the majority of the  inferolateral wall.  The distal circumflex gives rise to a small second  obtuse marginal branch.  The first obtuse marginal branch itself has a 50%  stenosis proximal to its bifurcation.   Right Coronary Artery:  The right coronary artery is a relatively small  vessel giving rise to a small posterior descending artery and a small  posterolateral branch.  There are minor luminal irregularities in the right  coronary artery.   IMPRESSION:  1. Normal left ventricular systolic function.  2. Moderate, but nonobstructive coronary artery disease.   RECOMMENDATIONS:  1. Medical therapy for the patient's coronary artery disease.  2. Would recommend consideration of an event monitor to assess the etiology     of the patient's palpitations.  Carole Binning, M.D. [facility]    MWP/MEDQ  D:  03/20/2004  T:  03/21/2004  Job:  [id number]   cc:   Titus Dubin. Alwyn Ren, M.D. [facility]   Maisie Fus C. Wall, M.D.   Cardiac Catheterization Laboratory

## 2011-01-29 NOTE — Consult Note (Signed)
NAME:  [name] [name], [name] [name] NO.:  [id number]   MEDICAL RECORD NO.:  [id number]          PATIENT TYPE:  EMS   LOCATION:  MAJO                         FACILITY:  [facility]   PHYSICIAN:  Rosalyn Gess. Norins, M.D. [facility] OF BIRTH:  April 25, 1932   DATE OF CONSULTATION:  01/10/2006  DATE OF DISCHARGE:                                   CONSULTATION   EMERGENCY DEPARTMENT CONSULTATION   CHIEF COMPLAINT:  Severe headache.   HISTORY OF THE PRESENT ILLNESS:  [name] is a 77 year old African-American  gentleman who reports that at about midday, he had a sudden hard pain in the  occipital region of his skull.  He felt he had hit his head on something.  The pain was enough so that he felt woozy and slumped against the wall, but  did not fall and did not lose consciousness.  He denies having any focal  weakness.  He had no slurred speech.  He had no double vision.  He reports  that he felt woozy for a while, but at 40 minutes later, by the time his  wife, arrived home he was back to his baseline with clear speech and normal  function with no weakness, no limp and no slurred speech or other problems.  Because of his symptoms, he was brought to the [facility] Emergency  Department for evaluation.   The patient reports that at home, when his wife arrived, he did have a  significantly elevated blood pressure at possibly 230/120.  She rechecked  it, it remained high and by the time he got to the hospital, it was down to  192/102.  He then tracked down to 136/84 and then at 2056 hours, was 150/95.   ER evaluation included a CBC with a white count of 6700, hemoglobin 12.4 g  and platelet count 195,000.  Differential was 60% segs, 28% lymphs, 9% monos  and 2% eosinophils.  Chemistries with a sodium of 141, potassium 4.2,  chloride 109, BUN is 16, creatinine of 1.5, glucose was 87, INR was 1, CK-MB  was less than 1 and troponin-I was less than 0.05.  Second set of cardiac  enzymes were likewise  negative.  The patient had a PA and lateral chest x-  ray that was normal.  The patient had a CT scan of the brain that was  unremarkable with no acute changes.  Because of a recurrent frontal headache  that the patient rated at 4/10, Dr. Lynelle Doctor sent him to x-ray for a  fluoroscopically guided lumbar puncture.  Opening pressure was 19 and by  report the spinal fluid was clear.  Initial cell counts were pending.   PAST MEDICAL HISTORY:  SURGICAL:  1.  The patient had a cyst removed from his right arm.  2.  The patient underwent TURBT/TURP years ago.  MEDICAL:  The patient had the usual childhood diseases.  He did have a  tonsillectomy as a child.  The patient has a history of hypertension since  age 88.  He has a history of hyperlipidemia.  He has a history of gout.  The  patient otherwise reports he has been healthy.  THE PATIENT HAS NO KNOWN  DRUG ALLERGIES.   CURRENT MEDICATIONS:  1.  Lovastatin 20 mg daily.  2.  Clonidine 0.2 mg b.i.d.  3.  Micardis 80 mg daily.  4.  Toprol XL 25 mg daily.  5.  Cosopt and Xalatan eye drops for glaucoma.   FAMILY HISTORY:  Mother died at age of 76 from congestive heart failure.  Father died at age 71 of pneumonia and also had had any MI.  The patient's  sister died at age 37 from congestive heart failure.   REVIEW OF HOSPITAL AND OFFICE RECORDS:  The patient was seen and evaluated  for atypical chest pain including cardiac catheterization March 20, 2004.  This revealed a 50% stenosis in mid LAD, otherwise unremarkable.  Circumflex  was unremarkable.  RCA of the small vessel with minor luminal  irregularities.  Final impression was moderate nonobstructive coronary  artery disease with normal left ventricular systolic function and the  patient was a candidate for medical management.   The patient have a 12-lead electrocardiogram performed in the emergency  department, which showed sinus bradycardia.  He had ST-T wave inversions in  V2 and 3, V4, 5  and 6.  This was different from previous EKGs.  The patient  had a limited consultation by Dr. Arvilla Meres because of the abnormal  EKG.  He did not think this represented acute coronary syndrome nor did he  think he needed a further cardiac evaluation at this time.  He did recommend  starting aspirin and better blood pressure control, and recommended follow  up with Dr. Daleen Squibb as an outpatient.   REVIEW OF SYSTEMS:  Negative for any constitutional, cardiovascular,  respiratory and GI problems or complaints.   EXAMINATION:  VITAL SIGNS:  The patient is afebrile at 97.3, last blood  pressure reading was 150/95, heart rate was 50, respirations were 21 and O2  sat was 99%.  GENERAL APPEARANCE:  This is a well-nourished, well-developed gentleman  looking younger than stated chronologic age.  He is in no acute distress.  HEENT EXAM:  Unremarkable with no signs of head trauma or abnormality.  NECK:  Supple.  CHEST:  Clear with no rales, wheezes or rhonchi.  CARDIOVASCULAR:  2+ radial pulses.  No JVD or carotid bruits.  He had a  quiet precordium with a regular rate and rhythm without murmurs, rubs or  gallops.  ABDOMEN:  Nontender.  GENITALIA AND RECTAL EXAM:  Deferred.  EXTREMITIES:  Without clubbing, cyanosis, edema or deformity.  NEUROLOGIC EXAM:  The patient is awake, alert and oriented to person, place,  time and context.  His speech is clear.  His memory is excellent.  His  cognitive function is normal.  Cranial nerves II-XII were intact with normal  facial symmetry and muscle movement, and he had normal extraocular muscles  with no deviations.  He had no deviation of the tongue or uvula.  He had  normal shoulder shrug.  Motor strength was 5/5 and symmetrical throughout.  Cerebellar function:  The patient had a normal gait.  He could tandem gait  without difficulty.  He had no pronator drift.  IMPRESSION AND PLAN:  Headache.  The patient with a significant and severe  sudden  transient headache.  His evaluation in the emergency department,  which lasted 14 hours, was unremarkable.  He was under constant observation  during that time and had no recurrent symptoms or problems.  He had a  minimal minor headache in the frontal region and had an LP for that, which  by preliminary report, sounds normal with final cell counts pending.  At  this point, I feel the patient is very stable and able to return home.  He  will continue on his present medications.  He will add aspirin to his  regimen.   DISPOSITION:  I recommend discharging the patient home if the EDP agrees.  I  have advised the patient to call back if he has any acute changes in his  health status of any kind and to call either the office or answering  service.  I have instructed the patient to call Dr. Ernst Spell office to  schedule an appointment for Wednesday or Thursday for follow up particularly  in regards to his blood pressure.  I also would have him get an appointment  to follow up with Dr. Juanito Doom for a cardiology follow up.   At the time of this discharge dictation, the patient is very stable.  He and  his wife agreed to this plan.  They understand that there is no guarantee  that he could not have further events.           ______________________________  Rosalyn Gess. Norins, M.D. [facility]     MEN/MEDQ  D:  01/10/2006  T:  01/10/2006  Job:  [id number]   cc:   Loreen Freud, M.D.  [address] [place]  [place] [address]   Jesse Sans. Wall, M.D.  [address]  [address]  [place]  [place] [address]

## 2011-02-01 ENCOUNTER — Other Ambulatory Visit: Payer: Self-pay | Admitting: Family Medicine

## 2011-02-01 ENCOUNTER — Encounter: Payer: Self-pay | Admitting: *Deleted

## 2011-02-01 NOTE — Telephone Encounter (Signed)
Letter Mail to Pt advising OV due now

## 2011-02-20 ENCOUNTER — Encounter: Payer: Self-pay | Admitting: Family Medicine

## 2011-02-22 ENCOUNTER — Other Ambulatory Visit: Payer: Self-pay | Admitting: Family Medicine

## 2011-02-23 ENCOUNTER — Encounter: Payer: Self-pay | Admitting: Family Medicine

## 2011-02-23 ENCOUNTER — Ambulatory Visit (INDEPENDENT_AMBULATORY_CARE_PROVIDER_SITE_OTHER): Payer: Federal, State, Local not specified - PPO | Admitting: Family Medicine

## 2011-02-23 VITALS — BP 114/68 | HR 41 | Temp 97.7°F | Ht 69.0 in | Wt 178.2 lb

## 2011-02-23 DIAGNOSIS — M549 Dorsalgia, unspecified: Secondary | ICD-10-CM

## 2011-02-23 DIAGNOSIS — E785 Hyperlipidemia, unspecified: Secondary | ICD-10-CM

## 2011-02-23 DIAGNOSIS — I10 Essential (primary) hypertension: Secondary | ICD-10-CM

## 2011-02-23 DIAGNOSIS — N529 Male erectile dysfunction, unspecified: Secondary | ICD-10-CM

## 2011-02-23 DIAGNOSIS — Z0000 Encounter for general adult medical examination without abnormal findings: Secondary | ICD-10-CM

## 2011-02-23 LAB — CBC WITH DIFFERENTIAL/PLATELET
Basophils Absolute: 0 10*3/uL (ref 0.0–0.1)
Basophils Relative: 0.3 % (ref 0.0–3.0)
Eosinophils Absolute: 0.2 10*3/uL (ref 0.0–0.7)
Lymphocytes Relative: 20.8 % (ref 12.0–46.0)
MCHC: 34 g/dL (ref 30.0–36.0)
MCV: 93.7 fl (ref 78.0–100.0)
Monocytes Absolute: 0.6 10*3/uL (ref 0.1–1.0)
Neutrophils Relative %: 69.1 % (ref 43.0–77.0)
Platelets: 211 10*3/uL (ref 150.0–400.0)
RBC: 4.11 Mil/uL — ABNORMAL LOW (ref 4.22–5.81)
RDW: 13.2 % (ref 11.5–14.6)

## 2011-02-23 LAB — LIPID PANEL
HDL: 40.3 mg/dL (ref >39.00)
Total CHOL/HDL Ratio: 3
Triglycerides: 77 mg/dL (ref 0.0–149.0)
VLDL: 15.4 mg/dL (ref 0.0–40.0)

## 2011-02-23 LAB — BASIC METABOLIC PANEL
BUN: 19 mg/dL (ref 6–23)
CO2: 26 mEq/L (ref 19–32)
Calcium: 9.3 mg/dL (ref 8.4–10.5)
Chloride: 110 mEq/L (ref 96–112)
Creatinine, Ser: 1.5 mg/dL (ref 0.4–1.5)
Glucose, Bld: 88 mg/dL (ref 70–99)

## 2011-02-23 LAB — HEPATIC FUNCTION PANEL
Bilirubin, Direct: 0.2 mg/dL (ref 0.0–0.3)
Total Protein: 7.7 g/dL (ref 6.0–8.3)

## 2011-02-23 LAB — POCT URINALYSIS DIPSTICK
Bilirubin, UA: NEGATIVE
Blood, UA: NEGATIVE
Glucose, UA: NEGATIVE
Spec Grav, UA: 1.025
Urobilinogen, UA: 0.2
pH, UA: 5

## 2011-02-23 MED ORDER — CYCLOBENZAPRINE HCL 10 MG PO TABS: 10.0000 mg | ORAL_TABLET | Freq: Three times a day (TID) | ORAL | Status: DC | PRN

## 2011-02-23 MED ORDER — TADALAFIL 20 MG PO TABS: 20.0000 mg | ORAL_TABLET | Freq: Every day | ORAL | Status: DC | PRN

## 2011-02-23 NOTE — Progress Notes (Signed)
  Subjective:    Patient ID: [name] [name], male    DOB: 28-Jul-1932, 77 y.o.   MRN: [id number]  HPI Pt here for cpe and labs.  No complaints.     Review of Systems   Review of Systems  Constitutional: Negative for activity change, appetite change and fatigue.  HENT: Negative for hearing loss, congestion, tinnitus and ear discharge.  Dentist--no Eyes: Negative for visual disturbance (see optho q1y -- vision corrected to 20/20 with glasses). ---Dr Lottie Dawson Respiratory: Negative for cough, chest tightness and shortness of breath.   Cardiovascular: Negative for chest pain, palpitations and leg swelling.  Gastrointestinal: Negative for abdominal pain, diarrhea, constipation and abdominal distention.  Genitourinary: Negative for urgency, frequency, decreased urine volume and difficulty urinating.  Musculoskeletal: Negative for back pain, arthralgias and gait problem.  Skin: Negative for color change, pallor and rash.  Neurological: Negative for dizziness, light-headedness, numbness and headaches.  Hematological: Negative for adenopathy. Does not bruise/bleed easily.  Psychiatric/Behavioral: Negative for suicidal ideas, confusion, sleep disturbance, self-injury, dysphoric mood, decreased concentration and agitation.  Pt is able to read and write and can do all ADLs No risk for falling No abuse/ violence in home    cardio--Dr Wall optho--Dr Dione Booze,  Dr Lottie Dawson Urology--Dr Lindley Magnus  Objective:   Physical Exam    BP 114/68  Pulse 41  Temp(Src) 97.7 F (36.5 C) (Oral)  Ht 5\' 9"  (1.753 m)  Wt 178 lb 3.2 oz (80.831 kg)  BMI 26.32 kg/m2  SpO2 99%  General Appearance:    Alert, cooperative, no distress, appears stated age  Head:    Normocephalic, without obvious abnormality, atraumatic  Eyes:    PERRL, conjunctiva/corneas clear, EOM's intact, fundi    benign, both eyes       Ears:    Normal TM's and external ear canals, both ears  Nose:   Nares normal, septum midline, mucosa normal, no  drainage   or sinus tenderness  Throat:   Lips, mucosa, and tongue normal; teeth and gums normal  Neck:   Supple, symmetrical, trachea midline, no adenopathy;       thyroid:  No enlargement/tenderness/nodules; no carotid   bruit or JVD  Back:     Symmetric, no curvature, ROM normal, no CVA tenderness  Lungs:     Clear to auscultation bilaterally, respirations unlabored  Chest wall:    No tenderness or deformity  Heart:    Regular rate and rhythm, S1 and S2 normal, no murmur, rub   or gallop  Abdomen:     Soft, non-tender, bowel sounds active all four quadrants,    no masses, no organomegaly  Genitalia:    Normal male without lesion, discharge or tenderness  Rectal:    Normal tone, prostate slightly enlarged, no masses or tenderness;   guaiac negative stool  Extremities:   Extremities normal, atraumatic, no cyanosis or edema  Pulses:   2+ and symmetric all extremities  Skin:   Skin color, texture, turgor normal, no rashes or lesions  Lymph nodes:   Cervical, supraclavicular, and axillary nodes normal  Neurologic:   CNII-XII intact. Normal strength, sensation and reflexes      throughout      Assessment & Plan:  Preventative--- colon,  Check on zostavax,  ghm utd Check fasting labs  HTN--con't meds Hyperlipidemia---check labs , con't meds

## 2011-02-23 NOTE — Patient Instructions (Signed)
Preventative Care for Adults, Male   MAINTAIN REGULAR HEALTH EXAMS   A routine yearly physical is a good way to check in with your primary care provider about your health and preventive screening. It is also an opportunity to share updates about your health and any concerns you have and receive a thorough all-over exam.   If you smoke or chew tobacco, find out from your caregiver how to quit. It can literally save your life, no matter how long you have been a tobacco user. If you do not use tobacco, never begin.   Maintain a healthy diet and normal weight. Increased weight leads to problems with blood pressure and diabetes. Decrease saturated fat in the diet and increase regular exercise. Get information about proper diet from your caregiver if necessary. Eat a variety of foods, including fruit, vegetables, animal or vegetable protein, such as meat, fish, chicken, and eggs, or beans, lentils, tofu, and grains, such as rice.   High blood pressure causes heart and blood vessel problems. Fat leaves deposits in your arteries that can block them. This causes heart disease and vessel disease elsewhere in your body. Check your blood pressure regularly and keep it within normal limits. Men over age 50 or those who have a family history of high blood pressure should have it checked at least every year.   Aerobic exercise helps maintain good heart health. 30 minutes of moderate-intensity exercise is recommended. For example, a brisk walk that increases your heart rate and breathing. This walk should be done on most days of the week. Persistent high blood pressure should be treated with medicine if weight loss and exercise do not work.   For many men aged 20 and older, having a cholesterol test of the blood every 5 years is recommended. If your cholesterol is found to be borderline high, or if you have heart disease or certain other medical conditions, then you may need to have it monitored more frequently.   Avoid smoking,  drinking alcohol in excess (more than two drinks per day) or use of street drugs. Do not share needles with anyone. Ask for professional help if you need assistance or instructions on stopping the use of alcohol, cigarettes, and/or drugs.   Maintain normal blood lipids and cholesterol by minimizing your intake of saturated fat. Eat a well rounded diet otherwise, with plenty of fruit and vegetables. The National Institutes of Health encourage men to eat 5-9 servings of fruit and vegetables each day. Your caregiver can help you keep your risk of heart disease or stroke at a lower level.   Ask your caregiver if you are in need of earlier testing because of: a strong family history of heart disease, or you have signs of elevated testosterone (male sex hormone) levels. This can predispose you to earlier heart disease. Ask if you should have a stress test if your history suggests this. A stress test is a test done on a treadmill that looks for heart disease. This test can find disease prior to there being a problem.   Diabetes screening assesses your blood sugar level after a fasting once every 3 years after age 45 if previous tests were normal.   Most routine colon cancer screening begins at the age of 50. On a yearly basis, doctors may provide special easy to use take-home tests to check for hidden blood in the stool. Sigmoidoscopy or colonoscopy can detect the earliest forms of colon cancer and is life saving. These test use a   small camera at the end of a tube to directly examine the colon. Speak to your caregiver about this at age 50, when routine screening begins (and is repeated every 5 years unless early forms of pre-cancerous polyps or small growths are found).   At the age of 50 men usually start screening for prostate cancer every year. Screening may begin at a younger age for those with higher risk. Those at higher risk include African-Americans or having a family history of prostate cancer. There are two types  of tests for prostate cancer:   Prostate-specific antigen (PSA) testing. Recent studies raise questions about prostate cancer using PSA and you should discuss this with your caregiver.   Digital rectal exam (in which your doctor’s lubricated and gloved finger feels for enlargement of the prostate through the anus).   Practice safe sex. Use condoms. Condoms are used for birth control and to help reduce the spread of sexually transmitted infections (or STIs). Unsafe sex is having an unprotected physical relationship with someone who is bisexual, homosexual, uses intravenous street drugs, or going with someone who has sexual relations with high-risk groups. Practicing safe sex helps you avoid getting an STI. Some of the STIs are gonorrhea (the clap), chlamydia, syphilis, trichimonas, herpes, HPV (human papiloma virus) and HIV (human immunodeficiency virus) which causes AIDS. The herpes, HIV and HPV are viral illnesses that have no cure. These can result in disability, cancer and death.   It is not safe for someone who has AIDS or is HIV positive to have unprotected sex with someone else who is positive. The reason for this is the fact that there are many different strains of HIV. If you have a strain that is readily treated with medications and then suddenly introduce a strain from a partner that has no further treatment options, you may suddenly have a strain of HIV that is untreatable. Even if you are both positive for HIV, it is still necessary to practice safe sex.   Use sunscreen with a SPF (or skin protection factor) of 15 or greater. Apply sunscreen liberally and repeatedly throughout the day. Being outside in the sun when your shadow caused by the sun is shorter than you are, means you are being exposed to sun at greater intensity. Lighter skinned people are at a greater risk of skin cancer. Don’t forget to also wear sunglasses in order to protect your eyes from too much damaging sunlight. Damaging sunlight can  accelerate cataract formation.   Once a month do a whole body skin exam or review, using a mirror to look at your back. Notify your caregivers of changes in moles, especially if there are changes in shapes, colors, a size larger than a pencil eraser, an irregular border, or development of new moles.   Keep carbon monoxide and smoke detectors in your home functioning at all times. Change the batteries every 6 months or use a model that plugs into the wall.   Do a monthly exam of your testicles. Gently roll each testicle between your thumb and fingers, feeling for any abnormal lumps. The best time to do this is after a hot shower or bath when the tissues are looser. Notify your caregivers of any lumps, tenderness or changes in size or shape immediately.   Stay up to date with your tetanus shots and other required immunizations. You should have a booster for tetanus every 10 years. Be sure to get your flu shot every year, since 5%-20% of the [place] population   comes down with the flu. The flu vaccine changes each year, so being vaccinated once is not enough. Get your shot in the fall, before the flu season peaks. The table below lists important vaccines to get. Other vaccines to consider include:   Hepatitis A virus (to prevent a form of infection of the liver by a virus acquired from food), Varicella Zoster (a virus that causes shingles).   Meningococcal (against bacteria which cause a form of meningitis).   Brush your teeth twice a day with fluoride toothpaste, and floss once a day. Good oral hygiene prevents tooth decay and gum disease. The problems can be painful, unattractive, and can cause other health problems. Visit your dentist for a routine oral and dental check up and preventive care every 6-12 months.   The Body Mass Index or BMI is a way of measuring how much of your body is fat. Having a BMI above 27 increases the risk of heart disease, diabetes, hypertension, stroke and other problems related to obesity.  Your caregiver can help determine your BMI and based on it develop an exercise and dietary program to help you achieve or maintain this important measurement at a healthful level.   Wear seat belts whenever in a vehicle, whether a passenger or driver, and even for very short drives of a few minutes.   If you bicycle, wear a helmet at all times.   Below is a summary of the most important preventative healthcare services that adult males should seek on a regular basis throughout their lives:   Preventative Care for Adult Males    Preventative Service  Ages 19-39  Ages 40-64  Ages 65 and over    Schedule of medical visits  Every 5 years  Every 5 years     Schedule of dental visits  Every 6-12 months  Every 6-12 months     Health risk assessment and lifestyle counseling  Every 3-5 years  Every 3-5 years  Every 3-5 years    Blood pressure check**  Every 2 years  Every 2 years  Every 2 years    Total cholesterol check including HDL**  Every 5 years beginning at age 35  Every 5 years  Every 5 years through age 75, then optional.    Flexible sigmoidoscopy or colonoscopy**   Every 5 years beginning at age 50  Every 5 years through age 80, then optional.    Prostate screening   Every year beginning at age 50  Every Year    Testicular exam  Monthly  Monthly  Monthly    FOBT (fecal occult blood test)   Every year beginning at age 50  Every year until 80, then optional.    Skin self-exam  Monthly  Monthly  Monthly    Tetanus-diphtheria (Td) immunization  Every 10 years  Every 10 years  Every 10 years    Influenza immunization**  Every year  Every year  Every year    Pneumococcal immunization**  Optional  Optional  Every 5 years    Hepatitis B immunization**  Series of 3 immunizations   (if not done previously, usually given at 0, 1 to 2, and 4 to 6 months)  Check with your caregiver if vaccination not previously given.  Check with your caregiver if vaccination not previously given.    **Family history and personal history of  risk and conditions may change your physician's recommendations.    Document Released: 10/26/2001 Document Re-Released: 11/24/2009   [employer]® Patient Information ©  2011 [employer].

## 2011-03-01 ENCOUNTER — Other Ambulatory Visit: Payer: Self-pay | Admitting: Family Medicine

## 2011-03-23 ENCOUNTER — Ambulatory Visit (AMBULATORY_SURGERY_CENTER): Payer: Federal, State, Local not specified - PPO

## 2011-03-23 VITALS — Ht 69.0 in | Wt 178.5 lb

## 2011-03-23 DIAGNOSIS — Z1211 Encounter for screening for malignant neoplasm of colon: Secondary | ICD-10-CM

## 2011-03-23 MED ORDER — PEG-KCL-NACL-NASULF-NA ASC-C 100 G PO SOLR: 1.0000 | Freq: Once | ORAL | Status: AC

## 2011-04-06 ENCOUNTER — Ambulatory Visit (AMBULATORY_SURGERY_CENTER): Payer: Federal, State, Local not specified - PPO | Admitting: Gastroenterology

## 2011-04-06 ENCOUNTER — Encounter: Payer: Self-pay | Admitting: Gastroenterology

## 2011-04-06 VITALS — BP 153/96 | HR 54 | Temp 97.8°F | Resp 18 | Ht 69.0 in | Wt 178.0 lb

## 2011-04-06 DIAGNOSIS — D126 Benign neoplasm of colon, unspecified: Secondary | ICD-10-CM

## 2011-04-06 DIAGNOSIS — Z1211 Encounter for screening for malignant neoplasm of colon: Secondary | ICD-10-CM

## 2011-04-06 DIAGNOSIS — K5730 Diverticulosis of large intestine without perforation or abscess without bleeding: Secondary | ICD-10-CM

## 2011-04-06 MED ORDER — SODIUM CHLORIDE 0.9 % IV SOLN: 500.0000 mL | INTRAVENOUS | Status: DC

## 2011-04-06 NOTE — Patient Instructions (Signed)
Polyps, Colon  A polyp is extra tissue that grows inside your body. Colon polyps grow in the large intestine. The large intestine, also called the colon, is part of your digestive system. It is a long, hollow tube at the end of your digestive tract where your body makes and stores stool. Most polyps are not dangerous. They are benign. This means they are not cancerous. But over time, some types of polyps can turn into cancer. Polyps that are smaller than a pea are usually not harmful. But larger polyps could someday become or may already be cancerous. To be safe, doctors remove all polyps and test them.  WHO GETS POLYPS? Anyone can get polyps, but certain people are more likely than others. You may have a greater chance of getting polyps if:  You are over 50.   You have had polyps before.   Someone in your family has had polyps.   Someone in your family has had cancer of the large intestine.   Find out if someone in your family has had polyps. You may also be more likely to get polyps if you:   Eat a lot of fatty foods   Smoke   Drink alcohol   Do not exercise  Eat too much  SYMPTOMS Most small polyps do not cause symptoms. People often do not know they have one until their caregiver finds it during a regular checkup or while testing them for something else. Some people do have symptoms like these:  Bleeding from the anus. You might notice blood on your underwear or on toilet paper after you have had a bowel movement.   Constipation or diarrhea that lasts more than a week.   Blood in the stool. Blood can make stool look black or it can show up as red streaks in the stool.  If you have any of these symptoms, see your caregiver. HOW DOES THE DOCTOR TEST FOR POLYPS? The doctor can use four tests to check for polyps:  Digital rectal exam. The caregiver wears gloves and checks your rectum (the last part of the large intestine) to see if it feels normal. This test would find polyps only  in the rectum. Your caregiver may need to do one of the other tests listed below to find polyps higher up in the intestine.   Barium enema. The caregiver puts a liquid called barium into your rectum before taking x-rays of your large intestine. Barium makes your intestine look white in the pictures. Polyps are dark, so they are easy to see.   Sigmoidoscopy. With this test, the caregiver can see inside your large intestine. A thin flexible tube is placed into your rectum. The device is called a sigmoidoscope, which has a light and a tiny video camera in it. The caregiver uses the sigmoidoscope to look at the last third of your large intestine.   Colonoscopy. This test is like sigmoidoscopy, but the caregiver looks at all of the large intestine. It usually requires sedation. This is the most common method for finding and removing polyps.  TREATMENT  The caregiver will remove the polyp during sigmoidoscopy or colonoscopy. The polyp is then tested for cancer.   If you have had polyps, your caregiver may want you to get tested regularly in the future.  PREVENTION There is not one sure way to prevent polyps. You might be able to lower your risk of getting them if you:  Eat more fruits and vegetables and less fatty food.     Do not smoke.   Avoid alcohol.   Exercise every day.   Lose weight if you are overweight.   Eating more calcium and folate can also lower your risk of getting polyps. Some foods that are rich in calcium are milk, cheese, and broccoli. Some foods that are rich in folate are chickpeas, kidney beans, and spinach.   Aspirin might help prevent polyps. Studies are under way.  Document Released: 05/26/2004 Document Re-Released: 02/17/2010 [employer] Patient Information 2011 [employer]  .Diverticulosis Diverticulosis is a common condition that develops when small pouches (diverticula) form in the wall of the colon. The risk of diverticulosis increases with age. It happens more  often in people who eat a low-fiber diet. Most individuals with diverticulosis have no symptoms. Those individuals with symptoms usually experience belly (abdominal) pain, constipation, or loose stools (diarrhea). HOME CARE INSTRUCTIONS  Increase the amount of fiber in your diet as directed by your caregiver or dietician. This may reduce symptoms of diverticulosis.   Your caregiver may recommend taking a dietary fiber supplement.   Drink at least 6 to 8 glasses of water each day to prevent constipation.   Try not to strain when you have a bowel movement.   Your caregiver may recommend avoiding nuts and seeds to prevent complications, although this is still an uncertain benefit.   Only take over-the-counter or prescription medicines for pain, discomfort, or fever as directed by your caregiver.  FOODS HAVING HIGH FIBER CONTENT INCLUDE:  Fruits. Apple, peach, pear, tangerine, raisins, prunes.   Vegetables. Brussels sprouts, asparagus, broccoli, cabbage, carrot, cauliflower, romaine lettuce, spinach, summer squash, tomato, winter squash, zucchini.   Starchy Vegetables. Baked beans, kidney beans, lima beans, split peas, lentils, potatoes (with skin).   Grains. Whole wheat bread, brown rice, bran flake cereal, plain oatmeal, white rice, shredded wheat, bran muffins.  SEEK IMMEDIATE MEDICAL CARE IF:  You develop increasing pain or severe bloating.   You have an oral temperature above 100, not controlled by medicine.   You develop vomiting or bowel movements that are bloody or black.  Document Released: 05/27/2004 Document Re-Released: 02/17/2010 [employer] Patient Information 2011 [employer]. 

## 2011-04-07 ENCOUNTER — Telehealth: Payer: Self-pay | Admitting: *Deleted

## 2011-04-07 NOTE — Telephone Encounter (Signed)

## 2011-04-12 ENCOUNTER — Encounter: Payer: Self-pay | Admitting: Cardiology

## 2011-04-16 ENCOUNTER — Ambulatory Visit (INDEPENDENT_AMBULATORY_CARE_PROVIDER_SITE_OTHER): Payer: Federal, State, Local not specified - PPO | Admitting: Cardiology

## 2011-04-16 ENCOUNTER — Encounter: Payer: Self-pay | Admitting: Cardiology

## 2011-04-16 VITALS — BP 124/70 | HR 51 | Ht 69.0 in | Wt 180.0 lb

## 2011-04-16 DIAGNOSIS — I2510 Atherosclerotic heart disease of native coronary artery without angina pectoris: Secondary | ICD-10-CM

## 2011-04-16 MED ORDER — NITROGLYCERIN 0.4 MG SL SUBL: 0.4000 mg | SUBLINGUAL_TABLET | SUBLINGUAL | Status: DC | PRN

## 2011-04-16 NOTE — Progress Notes (Signed)
HPI [name] [name] comes in today for evaluation and management of his coronary disease. He has a history of hypertension as well as hyperlipidemia being managed by Dr.Lowne. He also has an abnormal EKG which today is unchanged from June.  He reports no angina or ischemic symptoms. He no longer carry sublingual nitroglycerin. We have advised him to do so.  EKG today shows sinus bradycardia with ST-T wave changes in almost every single lead with significant T wave inversion in the V. Leads. He also has increased R-wave in V2. Unchanged since previous EKG.   Past Medical History  Diagnosis Date  . CAD in native artery   . Hypertension   . Hyperlipidemia   . Prostatic hypertrophy, benign   . Glaucoma   . PSA elevation   . Mole (skin)   . Abnormal EKG   . Renal insufficiency   . Weight loss   . Hip pain, left   . Routine general medical examination at a health care facility   . Unspecified personal history presenting hazards to health     prostate bx  . Abrasion     lower arm; with infection   . Hearing impairment     Past Surgical History  Procedure Date  . Prostate biopsy   . Cholecystectomy 03/2010    central Herreid surgery  . Colonoscopy   . Tonsilectomy, adenoidectomy, bilateral myringotomy and tubes   . Removal of cyst     right arm  . Shoulder surgery     cyst removed left shoulder  . Bladder surgery     Family History  Problem Relation Age of Onset  . Coronary artery disease Mother   . Hypertension Mother   . Heart disease Mother   . Diabetes Sister   . Heart disease Sister 12    died during cabg  . Hypertension Daughter   . Hypertension Son     History   Social History  . Marital Status: Married    Spouse Name: N/A    Number of Children: N/A  . Years of Education: N/A   Occupational History  . retired Research officer, political party    Social History Main Topics  . Smoking status: Former Smoker -- 0.3 packs/day for 20 years    Types: Cigarettes    Quit date:  02/22/1993  . Smokeless tobacco: Never Used  . Alcohol Use: No  . Drug Use: No  . Sexually Active: Yes -- Male partner(s)   Other Topics Concern  . Not on file   Social History Narrative  . No narrative on file    No Known Allergies  Current Outpatient Prescriptions  Medication Sig Dispense Refill  . aspirin 81 MG tablet Take 81 mg by mouth. Take one tab twice a week       . cloNIDine (CATAPRES) 0.1 MG tablet Take 0.1 mg by mouth 2 (two) times daily.        . CRESTOR 20 MG tablet TAKE ONE TABLET BY MOUTH AT BEDTIME  30 each  5  . dorzolamide-timolol (COSOPT) 22.3-6.8 MG/ML ophthalmic solution 1 drop 2 (two) times daily.        Marland Kitchen MATZIM LA 420 MG 24 hr tablet TAKE ONE TABLET BY MOUTH EVERY DAY  30 tablet  5  . metoprolol (TOPROL-XL) 50 MG 24 hr tablet TAKE ONE TABLET BY MOUTH EVERY DAY  30 tablet  1  . prednisoLONE acetate (PRED FORTE) 1 % ophthalmic suspension Place 1 drop into both eyes daily.        Marland Kitchen  tadalafil (CIALIS) 20 MG tablet Take 1 tablet (20 mg total) by mouth daily as needed.  10 tablet  2  . nitroGLYCERIN (NITROSTAT) 0.4 MG SL tablet Place 1 tablet (0.4 mg total) under the tongue every 5 (five) minutes as needed for chest pain.  90 tablet  12   Current Facility-Administered Medications  Medication Dose Route Frequency Provider Last Rate Last Dose  . 0.9 %  sodium chloride infusion  500 mL Intravenous Continuous Louis Meckel, MD        ROS Negative other than HPI.   PE General Appearance: well developed, well nourished in no acute distress HEENT: symmetrical face, PERRLA, poor dentition Neck: no JVD, thyromegaly, or adenopathy, trachea midline Chest: symmetric without deformity Cardiac: PMI non-displaced, RRR, normal S1, S2, no gallop or murmur Lung: clear to ausculation and percussion Vascular: all pulses full without bruits  Abdominal: nondistended, nontender, good bowel sounds, no HSM, no bruits Extremities: no cyanosis, clubbing or edema, no sign of  DVT, no varicosities  Skin: normal color, no rashes Neuro: alert and oriented x 3, non-focal Pysch: normal affect Filed Vitals:   04/16/11 1356  BP: 124/70  Pulse: 51  Height: 5\' 9"  (1.753 m)  Weight: 180 lb (81.647 kg)    EKG  Labs and Studies Reviewed.   Lab Results  Component Value Date   WBC 7.8 02/23/2011   HGB 13.1 02/23/2011   HCT 38.5* 02/23/2011   MCV 93.7 02/23/2011   PLT 211.0 02/23/2011      Chemistry      Component Value Date/Time   NA 141 02/23/2011 1132   K 4.6 02/23/2011 1132   CL 110 02/23/2011 1132   CO2 26 02/23/2011 1132   BUN 19 02/23/2011 1132   CREATININE 1.5 02/23/2011 1132      Component Value Date/Time   CALCIUM 9.3 02/23/2011 1132   ALKPHOS 54 02/23/2011 1132   AST 22 02/23/2011 1132   ALT 21 02/23/2011 1132   BILITOT 1.0 02/23/2011 1132       Lab Results  Component Value Date   CHOL 123 02/23/2011   CHOL 144 11/26/2009   CHOL 159 05/07/2009   Lab Results  Component Value Date   HDL 40.30 02/23/2011   HDL 16.10 11/26/2009   HDL 96.04* 05/07/2009   Lab Results  Component Value Date   LDLCALC 67 02/23/2011   LDLCALC 74 11/26/2009   LDLCALC 102* 05/07/2009   Lab Results  Component Value Date   TRIG 77.0 02/23/2011   TRIG 124.0 11/26/2009   TRIG 98.0 05/07/2009   Lab Results  Component Value Date   CHOLHDL 3 02/23/2011   CHOLHDL 3 11/26/2009   CHOLHDL 4 05/07/2009   Lab Results  Component Value Date   HGBA1C 5.2 10/18/2006   Lab Results  Component Value Date   ALT 21 02/23/2011   AST 22 02/23/2011   ALKPHOS 54 02/23/2011   BILITOT 1.0 02/23/2011   Lab Results  Component Value Date   TSH 1.38 05/19/2010

## 2011-04-16 NOTE — Assessment & Plan Note (Signed)
Stable. No change in treatment. We have given him sublingual nitroglycerin and instructions on excuse.

## 2011-04-16 NOTE — Patient Instructions (Signed)
  Your physician recommends that you schedule a follow-up appointment in: 1 year with Dr. Daleen Squibb  Your physician has recommended you make the following change in your medication:  Take sublingual nitroglycerin for angina ( chest pain) as direceted. Please see handout

## 2011-04-22 ENCOUNTER — Other Ambulatory Visit: Payer: Self-pay | Admitting: Family Medicine

## 2011-05-06 ENCOUNTER — Other Ambulatory Visit: Payer: Self-pay | Admitting: Family Medicine

## 2011-06-22 LAB — DIFFERENTIAL
Basophils Absolute: 0.1
Eosinophils Relative: 3
Lymphocytes Relative: 28
Lymphs Abs: 2.4
Monocytes Relative: 11
Neutrophils Relative %: 57

## 2011-06-22 LAB — CBC
MCV: 90.8
RBC: 4.19 — ABNORMAL LOW
WBC: 8.4

## 2011-06-22 LAB — BASIC METABOLIC PANEL
Chloride: 104
Creatinine, Ser: 1.68 — ABNORMAL HIGH
GFR calc Af Amer: 49 — ABNORMAL LOW
GFR calc non Af Amer: 40 — ABNORMAL LOW
Potassium: 3.6

## 2011-06-22 LAB — URINALYSIS, ROUTINE W REFLEX MICROSCOPIC
Glucose, UA: NEGATIVE
Nitrite: NEGATIVE
Specific Gravity, Urine: 1.029
pH: 5

## 2011-08-03 ENCOUNTER — Other Ambulatory Visit: Payer: Self-pay | Admitting: Family Medicine

## 2011-08-24 ENCOUNTER — Ambulatory Visit (INDEPENDENT_AMBULATORY_CARE_PROVIDER_SITE_OTHER): Payer: Federal, State, Local not specified - PPO | Admitting: Family Medicine

## 2011-08-24 ENCOUNTER — Encounter: Payer: Self-pay | Admitting: Family Medicine

## 2011-08-24 DIAGNOSIS — I10 Essential (primary) hypertension: Secondary | ICD-10-CM

## 2011-08-24 DIAGNOSIS — E785 Hyperlipidemia, unspecified: Secondary | ICD-10-CM

## 2011-08-24 LAB — BASIC METABOLIC PANEL
BUN: 18 mg/dL (ref 6–23)
CO2: 26 mEq/L (ref 19–32)
Calcium: 8.8 mg/dL (ref 8.4–10.5)
Glucose, Bld: 106 mg/dL — ABNORMAL HIGH (ref 70–99)
Potassium: 3.3 mEq/L — ABNORMAL LOW (ref 3.5–5.1)
Sodium: 140 mEq/L (ref 135–145)

## 2011-08-24 LAB — POCT URINALYSIS DIPSTICK
Bilirubin, UA: NEGATIVE
Ketones, UA: NEGATIVE
Leukocytes, UA: NEGATIVE

## 2011-08-24 LAB — HEPATIC FUNCTION PANEL
AST: 30 U/L (ref 0–37)
Albumin: 3.9 g/dL (ref 3.5–5.2)
Alkaline Phosphatase: 59 U/L (ref 39–117)
Total Protein: 7.5 g/dL (ref 6.0–8.3)

## 2011-08-24 NOTE — Patient Instructions (Signed)
Cholesterol Cholesterol is a white, waxy, fat-like protein needed by your body in small amounts. The liver makes all the cholesterol you need. It is carried from the liver by the blood through the blood vessels. Deposits (plaque) may build up on blood vessel walls. This makes the arteries narrower and stiffer. Plaque increases the risk for heart attack and stroke. You cannot feel your cholesterol level even if it is very high. The only way to know is by a blood test to check your lipid (fats) levels. Once you know your cholesterol levels, you should keep a record of the test results. Work with your caregiver to to keep your levels in the desired range. WHAT THE RESULTS MEAN:  Total cholesterol is a rough measure of all the cholesterol in your blood.   LDL is the so-called bad cholesterol. This is the type that deposits cholesterol in the walls of the arteries. You want this level to be low.   HDL is the good cholesterol because it cleans the arteries and carries the LDL away. You want this level to be high.   Triglycerides are fat that the body can either burn for energy or store. High levels are closely linked to heart disease.  DESIRED LEVELS:  Total cholesterol below 200.   LDL below 100 for people at risk, below 70 for very high risk.   HDL above 50 is good, above 60 is best.   Triglycerides below 150.  HOW TO LOWER YOUR CHOLESTEROL:  Diet.   Choose fish or white meat chicken and [place], roasted or baked. Limit fatty cuts of red meat, fried foods, and processed meats, such as sausage and lunch meat.   Eat lots of fresh fruits and vegetables. Choose whole grains, beans, pasta, potatoes and cereals.   Use only small amounts of olive, corn or canola oils. Avoid butter, mayonnaise, shortening or palm kernel oils. Avoid foods with trans-fats.   Use skim/nonfat milk and low-fat/nonfat yogurt and cheeses. Avoid whole milk, cream, ice cream, egg yolks and cheeses. Healthy desserts include  angel food cake, ginger snaps, animal crackers, hard candy, popsicles, and low-fat/nonfat frozen yogurt. Avoid pastries, cakes, pies and cookies.   Exercise.   A regular program helps decrease LDL and raises HDL.   Helps with weight control.   Do things that increase your activity level like gardening, walking, or taking the stairs.   Medication.   May be prescribed by your caregiver to help lowering cholesterol and the risk for heart disease.   You may need medicine even if your levels are normal if you have several risk factors.  HOME CARE INSTRUCTIONS   Follow your diet and exercise programs as suggested by your caregiver.   Take medications as directed.   Have blood work done when your caregiver feels it is necessary.  MAKE SURE YOU:   Understand these instructions.   Will watch your condition.   Will get help right away if you are not doing well or get worse.  Document Released: 05/25/2001 Document Revised: 05/12/2011 Document Reviewed: 11/15/2007 [facility] Patient Information 2012 [place], [place].  Hypertension As your heart beats, it forces blood through your arteries. This force is your blood pressure. If the pressure is too high, it is called hypertension (HTN) or high blood pressure. HTN is dangerous because you may have it and not know it. High blood pressure may mean that your heart has to work harder to pump blood. Your arteries may be narrow or stiff. The extra work  puts you at risk for heart disease, stroke, and other problems.  Blood pressure consists of two numbers, a higher number over a lower, 110/72, for example. It is stated as "110 over 72." The ideal is below 120 for the top number (systolic) and under 80 for the bottom (diastolic). Write down your blood pressure today. You should pay close attention to your blood pressure if you have certain conditions such as:  Heart failure.   Prior heart attack.   Diabetes   Chronic kidney disease.   Prior  stroke.   Multiple risk factors for heart disease.  To see if you have HTN, your blood pressure should be measured while you are seated with your arm held at the level of the heart. It should be measured at least twice. A one-time elevated blood pressure reading (especially in the Emergency Department) does not mean that you need treatment. There may be conditions in which the blood pressure is different between your right and left arms. It is important to see your caregiver soon for a recheck. Most people have essential hypertension which means that there is not a specific cause. This type of high blood pressure may be lowered by changing lifestyle factors such as:  Stress.   Smoking.   Lack of exercise.   Excessive weight.   Drug/tobacco/alcohol use.   Eating less salt.  Most people do not have symptoms from high blood pressure until it has caused damage to the body. Effective treatment can often prevent, delay or reduce that damage. TREATMENT  When a cause has been identified, treatment for high blood pressure is directed at the cause. There are a large number of medications to treat HTN. These fall into several categories, and your caregiver will help you select the medicines that are best for you. Medications may have side effects. You should review side effects with your caregiver. If your blood pressure stays high after you have made lifestyle changes or started on medicines,   Your medication(s) may need to be changed.   Other problems may need to be addressed.   Be certain you understand your prescriptions, and know how and when to take your medicine.   Be sure to follow up with your caregiver within the time frame advised (usually within two weeks) to have your blood pressure rechecked and to review your medications.   If you are taking more than one medicine to lower your blood pressure, make sure you know how and at what times they should be taken. Taking two medicines at  the same time can result in blood pressure that is too low.  SEEK IMMEDIATE MEDICAL CARE IF:  You develop a severe headache, blurred or changing vision, or confusion.   You have unusual weakness or numbness, or a faint feeling.   You have severe chest or abdominal pain, vomiting, or breathing problems.  MAKE SURE YOU:   Understand these instructions.   Will watch your condition.   Will get help right away if you are not doing well or get worse.  Document Released: 08/30/2005 Document Revised: 05/12/2011 Document Reviewed: 04/19/2008 [facility] Patient Information 2012 [place], [place].

## 2011-08-24 NOTE — Progress Notes (Signed)
  Subjective:    Patient here for follow-up of elevated blood pressure.  He is exercising and is adherent to a low-salt diet.  Blood pressure is well controlled at home. Cardiac symptoms: none. Patient denies: chest pain, chest pressure/discomfort, claudication, dyspnea, exertional chest pressure/discomfort, fatigue, irregular heart beat, lower extremity edema, near-syncope, orthopnea, palpitations, paroxysmal nocturnal dyspnea, syncope and tachypnea. Cardiovascular risk factors: advanced age (older than 38 for men, 10 for women), dyslipidemia, hypertension and male gender. Use of agents associated with hypertension: none. History of target organ damage: none.  The following portions of the patient's history were reviewed and updated as appropriate: allergies, current medications, past family history, past medical history, past social history, past surgical history and problem list.  Review of Systems Pertinent items are noted in HPI.     Objective:    Heart: S1, S2 normal and +2/6 SEM    Assessment:    Hypertension, normal blood pressure . Evidence of target organ damage: none.    Plan:    Medication: no change. Dietary sodium restriction. Regular aerobic exercise. Check blood pressures 2-3 times weekly and record. Follow up: 6 months and as needed.

## 2011-09-02 ENCOUNTER — Other Ambulatory Visit: Payer: Self-pay | Admitting: Family Medicine

## 2011-09-20 ENCOUNTER — Other Ambulatory Visit: Payer: Self-pay | Admitting: Family Medicine

## 2011-09-28 ENCOUNTER — Other Ambulatory Visit: Payer: Self-pay | Admitting: Family Medicine

## 2011-09-28 NOTE — Telephone Encounter (Signed)
rx sent to pharmacy by e-script  

## 2011-11-02 ENCOUNTER — Other Ambulatory Visit: Payer: Self-pay | Admitting: Family Medicine

## 2011-12-03 ENCOUNTER — Other Ambulatory Visit: Payer: Self-pay | Admitting: Family Medicine

## 2012-02-24 ENCOUNTER — Other Ambulatory Visit: Payer: Self-pay | Admitting: Family Medicine

## 2012-02-25 ENCOUNTER — Ambulatory Visit: Payer: Federal, State, Local not specified - PPO | Admitting: Family Medicine

## 2012-03-01 ENCOUNTER — Other Ambulatory Visit: Payer: Self-pay | Admitting: Family Medicine

## 2012-03-01 ENCOUNTER — Ambulatory Visit (INDEPENDENT_AMBULATORY_CARE_PROVIDER_SITE_OTHER): Payer: Federal, State, Local not specified - PPO | Admitting: Family Medicine

## 2012-03-01 ENCOUNTER — Encounter: Payer: Self-pay | Admitting: Family Medicine

## 2012-03-01 VITALS — BP 140/82 | HR 76 | Temp 97.9°F | Wt 180.0 lb

## 2012-03-01 DIAGNOSIS — I10 Essential (primary) hypertension: Secondary | ICD-10-CM

## 2012-03-01 DIAGNOSIS — R1013 Epigastric pain: Secondary | ICD-10-CM

## 2012-03-01 DIAGNOSIS — E785 Hyperlipidemia, unspecified: Secondary | ICD-10-CM

## 2012-03-01 LAB — BASIC METABOLIC PANEL
BUN: 15 mg/dL (ref 6–23)
CO2: 26 mEq/L (ref 19–32)
Chloride: 110 mEq/L (ref 96–112)
Creatinine, Ser: 1.4 mg/dL (ref 0.4–1.5)
Glucose, Bld: 102 mg/dL — ABNORMAL HIGH (ref 70–99)

## 2012-03-01 LAB — LIPID PANEL
Cholesterol: 98 mg/dL (ref 0–200)
Triglycerides: 103 mg/dL (ref 0.0–149.0)

## 2012-03-01 LAB — HEPATIC FUNCTION PANEL
ALT: 18 U/L (ref 0–53)
AST: 22 U/L (ref 0–37)
Albumin: 3.7 g/dL (ref 3.5–5.2)
Total Bilirubin: 1.2 mg/dL (ref 0.3–1.2)
Total Protein: 7.2 g/dL (ref 6.0–8.3)

## 2012-03-01 MED ORDER — OMEPRAZOLE 40 MG PO CPDR: 40.0000 mg | DELAYED_RELEASE_CAPSULE | Freq: Every day | ORAL | Status: DC

## 2012-03-01 NOTE — Progress Notes (Signed)
  Subjective:    Patient here for follow-up of elevated blood pressure.  He is exercising and is adherent to a low-salt diet.  Blood pressure is well controlled at home. Cardiac symptoms: none. Patient denies: chest pain, chest pressure/discomfort, claudication, dyspnea, exertional chest pressure/discomfort, fatigue, irregular heart beat, lower extremity edema, near-syncope, orthopnea, palpitations, paroxysmal nocturnal dyspnea, syncope and tachypnea. Cardiovascular risk factors: advanced age (older than 28 for men, 76 for women), dyslipidemia, hypertension and male gender. Use of agents associated with hypertension: none. History of target organ damage: none.  The following portions of the patient's history were reviewed and updated as appropriate: allergies, current medications, past family history, past medical history, past social history, past surgical history and problem list.  Review of Systems Pertinent items are noted in HPI.     Objective:    BP 140/82  Pulse 76  Temp 97.9 F (36.6 C) (Oral)  Wt 180 lb (81.647 kg)  SpO2 97% General appearance: alert, cooperative, appears stated age and no distress Lungs: clear to auscultation bilaterally Heart: S1, S2 normal and + murmur Extremities: extremities normal, atraumatic, no cyanosis or edema    Assessment:    Hypertension, normal blood pressure . Evidence of target organ damage: none.   hyperlipidemia--check labs, con't meds Plan:    Medication: no change. Dietary sodium restriction. Regular aerobic exercise. Check blood pressures 2-3 times weekly and record. Follow up: 6 months and as needed.

## 2012-03-01 NOTE — Patient Instructions (Signed)

## 2012-03-17 ENCOUNTER — Other Ambulatory Visit: Payer: Self-pay | Admitting: Family Medicine

## 2012-03-17 NOTE — Telephone Encounter (Signed)
Refill done.  

## 2012-03-27 ENCOUNTER — Other Ambulatory Visit: Payer: Self-pay | Admitting: Family Medicine

## 2012-04-17 ENCOUNTER — Other Ambulatory Visit: Payer: Self-pay | Admitting: Family Medicine

## 2012-04-21 ENCOUNTER — Encounter: Payer: Self-pay | Admitting: Cardiology

## 2012-04-21 ENCOUNTER — Ambulatory Visit (INDEPENDENT_AMBULATORY_CARE_PROVIDER_SITE_OTHER): Payer: Federal, State, Local not specified - PPO | Admitting: Cardiology

## 2012-04-21 VITALS — BP 126/66 | HR 44 | Ht 68.5 in | Wt 194.0 lb

## 2012-04-21 DIAGNOSIS — I2510 Atherosclerotic heart disease of native coronary artery without angina pectoris: Secondary | ICD-10-CM

## 2012-04-21 DIAGNOSIS — R9431 Abnormal electrocardiogram [ECG] [EKG]: Secondary | ICD-10-CM

## 2012-04-21 DIAGNOSIS — I498 Other specified cardiac arrhythmias: Secondary | ICD-10-CM

## 2012-04-21 DIAGNOSIS — E785 Hyperlipidemia, unspecified: Secondary | ICD-10-CM

## 2012-04-21 DIAGNOSIS — I10 Essential (primary) hypertension: Secondary | ICD-10-CM

## 2012-04-21 DIAGNOSIS — R001 Bradycardia, unspecified: Secondary | ICD-10-CM

## 2012-04-21 HISTORY — DX: Bradycardia, unspecified: R00.1

## 2012-04-21 MED ORDER — METOPROLOL SUCCINATE ER 25 MG PO TB24: 25.0000 mg | ORAL_TABLET | Freq: Every day | ORAL | Status: DC

## 2012-04-21 MED ORDER — NITROGLYCERIN 0.4 MG SL SUBL: 0.4000 mg | SUBLINGUAL_TABLET | SUBLINGUAL | Status: DC | PRN

## 2012-04-21 NOTE — Assessment & Plan Note (Signed)
He is remarkably stable. I am concerned about his heart rate being as low. I cut his metoprolol in half.  We also renewed his sublingual nitroglycerin. I've talked to him about symptoms of angina or ischemia and how to appropriately use nitroglycerin.

## 2012-04-21 NOTE — Progress Notes (Signed)
HPI [name] returns today for evaluation and management his coronary artery disease and dramatically abnormal EKG. He also has significant bradycardia.  He denies any chest pain or angina. He occasionally take a nitroglycerin for well localized upper, mild discomfort which does not sound anginal.  He denies any syncope or presyncope. He's had no palpitations.  He is very compliant with his medications. Blood pressures been under good control.  Past Medical History  Diagnosis Date  . CAD in native artery   . Hypertension   . Hyperlipidemia   . Prostatic hypertrophy, benign   . Glaucoma   . PSA elevation   . Mole (skin)   . Abnormal EKG   . Renal insufficiency   . Weight loss   . Hip pain, left   . Routine general medical examination at a health care facility   . Unspecified personal history presenting hazards to health     prostate bx  . Abrasion     lower arm; with infection   . Hearing impairment     Current Outpatient Prescriptions  Medication Sig Dispense Refill  . aspirin 81 MG tablet Take 81 mg by mouth. Take one tab twice a week       . cloNIDine (CATAPRES) 0.1 MG tablet TAKE ONE TABLET BY MOUTH TWICE DAILY  60 tablet  5  . CRESTOR 20 MG tablet TAKE ONE TABLET BY MOUTH AT BEDTIME  30 each  5  . dorzolamide-timolol (COSOPT) 22.3-6.8 MG/ML ophthalmic solution 1 drop 2 (two) times daily.        Marland Kitchen MATZIM LA 420 MG 24 hr tablet TAKE ONE TABLET BY MOUTH EVERY DAY  30 tablet  5  . metoprolol succinate (TOPROL-XL) 50 MG 24 hr tablet TAKE ONE TABLET BY MOUTH EVERY DAY  30 tablet  5  . nitroGLYCERIN (NITROSTAT) 0.4 MG SL tablet Place 0.4 mg under the tongue every 5 (five) minutes as needed.      Marland Kitchen omeprazole (PRILOSEC) 40 MG capsule Take 1 capsule (40 mg total) by mouth daily.  30 capsule  11  . prednisoLONE acetate (PRED FORTE) 1 % ophthalmic suspension Place 1 drop into both eyes daily.        . tadalafil (CIALIS) 20 MG tablet Take 1 tablet (20 mg total) by mouth daily as  needed.  10 tablet  2  . DISCONTD: nitroGLYCERIN (NITROSTAT) 0.4 MG SL tablet Place 1 tablet (0.4 mg total) under the tongue every 5 (five) minutes as needed for chest pain.  90 tablet  12   Current Facility-Administered Medications  Medication Dose Route Frequency Provider Last Rate Last Dose  . 0.9 %  sodium chloride infusion  500 mL Intravenous Continuous Louis Meckel, MD        No Known Allergies  Family History  Problem Relation Age of Onset  . Coronary artery disease Mother   . Hypertension Mother   . Heart disease Mother   . Diabetes Sister   . Heart disease Sister 30    died during cabg  . Hypertension Daughter   . Hypertension Son     History   Social History  . Marital Status: Married    Spouse Name: N/A    Number of Children: N/A  . Years of Education: N/A   Occupational History  . retired Research officer, political party    Social History Main Topics  . Smoking status: Former Smoker -- 0.3 packs/day for 20 years    Types: Cigarettes    Quit  date: 02/22/1993  . Smokeless tobacco: Never Used  . Alcohol Use: No  . Drug Use: No  . Sexually Active: Yes -- Male partner(s)   Other Topics Concern  . Not on file   Social History Narrative  . No narrative on file    ROS ALL NEGATIVE EXCEPT THOSE NOTED IN HPI  PE  General Appearance: well developed, well nourished in no acute distress HEENT: symmetrical face, PERRLA, good dentition  Neck: no JVD, thyromegaly, or adenopathy, trachea midline Chest: symmetric without deformity Cardiac: PMI non-displaced, slow rate and rhyth normal S1, S2, no gallop or murmur Lung: clear to ausculation and percussion Vascular: all pulses full without bruits  Abdominal: nondistended, nontender, good bowel sounds, no HSM, no bruits Extremities: no cyanosis, clubbing or edema, no sign of DVT, no varicosities  Skin: normal color, no rashes Neuro: alert and oriented x 3, non-focal Pysch: normal affect  EKG marked sinus bradycardia, LVH,  ST segment changes as previously noted without any significant change. BMET    Component Value Date/Time   NA 141 03/01/2012 0944   K 3.5 03/01/2012 0944   CL 110 03/01/2012 0944   CO2 26 03/01/2012 0944   GLUCOSE 102* 03/01/2012 0944   BUN 15 03/01/2012 0944   CREATININE 1.4 03/01/2012 0944   CALCIUM 8.9 03/01/2012 0944   GFRNONAA 43* 06/26/2010 1504   GFRAA  Value: 53        The eGFR has been calculated using the MDRD equation. This calculation has not been validated in all clinical situations. eGFR's persistently <60 mL/min signify possible Chronic Kidney Disease.* 06/26/2010 1504    Lipid Panel     Component Value Date/Time   CHOL 98 03/01/2012 0944   TRIG 103.0 03/01/2012 0944   HDL 40.10 03/01/2012 0944   CHOLHDL 2 03/01/2012 0944   VLDL 20.6 03/01/2012 0944   LDLCALC 37 03/01/2012 0944    CBC    Component Value Date/Time   WBC 7.8 02/23/2011 1132   RBC 4.11* 02/23/2011 1132   HGB 13.1 02/23/2011 1132   HCT 38.5* 02/23/2011 1132   PLT 211.0 02/23/2011 1132   MCV 93.7 02/23/2011 1132   MCH 30.9 06/26/2010 1504   MCHC 34.0 02/23/2011 1132   RDW 13.2 02/23/2011 1132   LYMPHSABS 1.6 02/23/2011 1132   MONOABS 0.6 02/23/2011 1132   EOSABS 0.2 02/23/2011 1132   BASOSABS 0.0 02/23/2011 1132

## 2012-04-21 NOTE — Assessment & Plan Note (Signed)
As noted above, I have cut his metoprolol in half.

## 2012-04-21 NOTE — Patient Instructions (Addendum)
Your physician has recommended you make the following change in your medication:  decrease Toprol to 25mg  daily  Your physician wants you to follow-up in: 1 year with Dr. Daleen Squibb.  Nitroglycerin sublingual tablets  NITROGLYCERIN (nye troe GLI ser in) is a type of vasodilator. It relaxes blood vessels, increasing the blood and oxygen supply to your heart. This medicine is used to relieve chest pain caused by angina. It is also used to prevent chest pain before activities like climbing stairs, going outdoors in cold weather, or sexual activity.  How should I use this medicine?   At the first sign of an angina attack (chest pain or tightness) place one tablet under your tongue.  You can also take this medicine 5 to 10 minutes before an event likely to produce chest pain.  Follow the directions on the prescription label.  Let the tablet dissolve under the tongue.  Do not swallow whole. Replace the dose if you accidentally swallow it. It will help if your mouth is not dry. Saliva around the tablet will help it to dissolve more quickly. Do not eat or drink, smoke or chew tobacco while a tablet is dissolving. If you are not better within 5 minutes after taking ONE dose of nitroglycerin, call 9-1-1 immediately to seek emergency medical care. Do not take more than 3 nitroglycerin tablets over 15 minutes. If you take this medicine often to relieve symptoms of angina, your doctor or health care professional may provide you with different instructions to manage your symptoms. If symptoms do not go away after following these instructions, it is important to call 9-1-1 immediately. Do not take more than 3 nitroglycerin tablets over 15 minutes.  You may get drowsy or dizzy. Do not drive, use machinery, or do anything that needs mental alertness until you know how this drug affects you. Do not stand or sit up quickly, especially if you are an older patient. This reduces the risk of dizzy or fainting spells. Alcohol  can make you more drowsy and dizzy. Avoid alcoholic drinks.

## 2012-05-01 ENCOUNTER — Other Ambulatory Visit: Payer: Self-pay | Admitting: Family Medicine

## 2012-08-28 ENCOUNTER — Other Ambulatory Visit: Payer: Self-pay | Admitting: Family Medicine

## 2012-09-27 ENCOUNTER — Ambulatory Visit: Payer: Federal, State, Local not specified - PPO | Admitting: Family Medicine

## 2012-09-29 ENCOUNTER — Ambulatory Visit (INDEPENDENT_AMBULATORY_CARE_PROVIDER_SITE_OTHER): Payer: Federal, State, Local not specified - PPO | Admitting: Family Medicine

## 2012-09-29 ENCOUNTER — Encounter: Payer: Self-pay | Admitting: Family Medicine

## 2012-09-29 VITALS — BP 136/78 | HR 83 | Temp 98.3°F | Wt 180.4 lb

## 2012-09-29 DIAGNOSIS — E785 Hyperlipidemia, unspecified: Secondary | ICD-10-CM

## 2012-09-29 DIAGNOSIS — I10 Essential (primary) hypertension: Secondary | ICD-10-CM

## 2012-09-29 NOTE — Progress Notes (Signed)
  Subjective:    Patient here for follow-up of elevated blood pressure.  He is exercising and is adherent to a low-salt diet.  Blood pressure is well controlled at home. Cardiac symptoms: none. Patient denies: chest pain, chest pressure/discomfort, claudication, dyspnea, exertional chest pressure/discomfort, fatigue, irregular heart beat, lower extremity edema, near-syncope, orthopnea, palpitations, paroxysmal nocturnal dyspnea, syncope and tachypnea. Cardiovascular risk factors: advanced age (older than 67 for men, 42 for women), dyslipidemia, hypertension and male gender. Use of agents associated with hypertension: none. History of target organ damage: none.  The following portions of the patient's history were reviewed and updated as appropriate: allergies, current medications, past family history, past medical history, past social history, past surgical history and problem list.  Review of Systems Pertinent items are noted in HPI.     Objective:    BP 136/78  Pulse 83  Temp 98.3 F (36.8 C) (Oral)  Wt 180 lb 6.4 oz (81.829 kg)  SpO2 98% General appearance: alert, cooperative, appears stated age and no distress Lungs: clear to auscultation bilaterally Heart: S1, S2 normal Extremities: extremities normal, atraumatic, no cyanosis or edema    Assessment:    Hypertension, normal blood pressure  . Evidence of target organ damage: none.    Plan:    Medication: no change. Dietary sodium restriction. Regular aerobic exercise. Check blood pressures 2-3 times weekly and record. Follow up: 6 months and as needed.

## 2012-09-29 NOTE — Patient Instructions (Signed)

## 2012-10-03 ENCOUNTER — Other Ambulatory Visit (INDEPENDENT_AMBULATORY_CARE_PROVIDER_SITE_OTHER): Payer: Federal, State, Local not specified - PPO

## 2012-10-03 DIAGNOSIS — E785 Hyperlipidemia, unspecified: Secondary | ICD-10-CM

## 2012-10-03 DIAGNOSIS — I10 Essential (primary) hypertension: Secondary | ICD-10-CM

## 2012-10-03 LAB — LIPID PANEL
HDL: 34.1 mg/dL — ABNORMAL LOW (ref >39.00)
LDL Cholesterol: 56 mg/dL (ref 0–99)
Total CHOL/HDL Ratio: 4
Triglycerides: 149 mg/dL (ref 0.0–149.0)
VLDL: 29.8 mg/dL (ref 0.0–40.0)

## 2012-10-03 LAB — HEPATIC FUNCTION PANEL
Albumin: 3.8 g/dL (ref 3.5–5.2)
Total Protein: 7.5 g/dL (ref 6.0–8.3)

## 2012-10-03 LAB — BASIC METABOLIC PANEL
CO2: 25 mEq/L (ref 19–32)
Chloride: 106 mEq/L (ref 96–112)
Sodium: 138 mEq/L (ref 135–145)

## 2012-10-24 ENCOUNTER — Other Ambulatory Visit: Payer: Self-pay | Admitting: Family Medicine

## 2012-10-27 ENCOUNTER — Other Ambulatory Visit: Payer: Self-pay | Admitting: Family Medicine

## 2013-01-25 ENCOUNTER — Other Ambulatory Visit: Payer: Self-pay | Admitting: Family Medicine

## 2013-04-02 ENCOUNTER — Encounter: Payer: Self-pay | Admitting: Family Medicine

## 2013-04-02 ENCOUNTER — Ambulatory Visit (INDEPENDENT_AMBULATORY_CARE_PROVIDER_SITE_OTHER): Payer: Federal, State, Local not specified - PPO | Admitting: Family Medicine

## 2013-04-02 VITALS — BP 134/76 | HR 50 | Temp 98.4°F | Ht 68.5 in | Wt 187.2 lb

## 2013-04-02 DIAGNOSIS — E785 Hyperlipidemia, unspecified: Secondary | ICD-10-CM

## 2013-04-02 DIAGNOSIS — R809 Proteinuria, unspecified: Secondary | ICD-10-CM

## 2013-04-02 DIAGNOSIS — R739 Hyperglycemia, unspecified: Secondary | ICD-10-CM

## 2013-04-02 DIAGNOSIS — N529 Male erectile dysfunction, unspecified: Secondary | ICD-10-CM

## 2013-04-02 DIAGNOSIS — N400 Enlarged prostate without lower urinary tract symptoms: Secondary | ICD-10-CM

## 2013-04-02 DIAGNOSIS — Z0000 Encounter for general adult medical examination without abnormal findings: Secondary | ICD-10-CM

## 2013-04-02 DIAGNOSIS — I10 Essential (primary) hypertension: Secondary | ICD-10-CM

## 2013-04-02 DIAGNOSIS — I2510 Atherosclerotic heart disease of native coronary artery without angina pectoris: Secondary | ICD-10-CM

## 2013-04-02 DIAGNOSIS — R7309 Other abnormal glucose: Secondary | ICD-10-CM

## 2013-04-02 LAB — HEPATIC FUNCTION PANEL
ALT: 21 U/L (ref 0–53)
Albumin: 3.6 g/dL (ref 3.5–5.2)
Total Protein: 6.8 g/dL (ref 6.0–8.3)

## 2013-04-02 LAB — MICROALBUMIN / CREATININE URINE RATIO: Microalb Creat Ratio: 1.1 mg/g (ref 0.0–30.0)

## 2013-04-02 LAB — CBC WITH DIFFERENTIAL/PLATELET
Basophils Absolute: 0.1 10*3/uL (ref 0.0–0.1)
Eosinophils Absolute: 0.2 10*3/uL (ref 0.0–0.7)
HCT: 39.1 % (ref 39.0–52.0)
Hemoglobin: 13 g/dL (ref 13.0–17.0)
Lymphs Abs: 1.5 10*3/uL (ref 0.7–4.0)
MCHC: 33.4 g/dL (ref 30.0–36.0)
MCV: 94.5 fl (ref 78.0–100.0)
Monocytes Absolute: 0.6 10*3/uL (ref 0.1–1.0)
Neutro Abs: 5.2 10*3/uL (ref 1.4–7.7)
RDW: 14 % (ref 11.5–14.6)

## 2013-04-02 LAB — LIPID PANEL
HDL: 35.4 mg/dL — ABNORMAL LOW (ref >39.00)
Triglycerides: 134 mg/dL (ref 0.0–149.0)

## 2013-04-02 LAB — BASIC METABOLIC PANEL
CO2: 26 mEq/L (ref 19–32)
Calcium: 9.2 mg/dL (ref 8.4–10.5)
Chloride: 108 mEq/L (ref 96–112)
Glucose, Bld: 99 mg/dL (ref 70–99)
Sodium: 140 mEq/L (ref 135–145)

## 2013-04-02 LAB — PSA: PSA: 16.51 ng/mL — ABNORMAL HIGH (ref 0.10–4.00)

## 2013-04-02 MED ORDER — SILDENAFIL CITRATE 100 MG PO TABS: 50.0000 mg | ORAL_TABLET | Freq: Every day | ORAL | Status: DC | PRN

## 2013-04-02 NOTE — Assessment & Plan Note (Signed)
Stable con't meds 

## 2013-04-02 NOTE — Assessment & Plan Note (Signed)
Check labs 

## 2013-04-02 NOTE — Assessment & Plan Note (Signed)
Still elevated

## 2013-04-02 NOTE — Assessment & Plan Note (Signed)
Check labs con't meds 

## 2013-04-02 NOTE — Progress Notes (Signed)
Subjective:    [name] [name] is a 79 y.o. male who presents for Medicare Annual/Subsequent preventive examination.   Preventive Screening-Counseling & Management  Tobacco History  Smoking status  . Former Smoker -- 0.30 packs/day for 20 years  . Types: Cigarettes  . Quit date: 02/22/1993  Smokeless tobacco  . Never Used    Problems Prior to Visit 1.   Current Problems (verified) Patient Active Problem List   Diagnosis Date Noted  . Bradycardia 04/21/2012  . ACUTE CHOLECYSTITIS 05/22/2010  . Flatulence, eructation, and gas pain 05/19/2010  . ABDOMINAL PAIN OTHER SPECIFIED SITE 05/19/2010  . CAD, NATIVE VESSEL 04/07/2010  . PSA, INCREASED 12/06/2009  . MOLE 11/24/2009  . ABNORMAL EKG 04/10/2009  . WEIGHT LOSS 06/19/2008  . HIP PAIN, LEFT 04/29/2008  . RENAL INSUFFICIENCY 04/07/2007  . COLONOSCOPY, HX OF 01/30/2007  . GLAUCOMA NOS 12/29/2006  . HYPERLIPIDEMIA 12/08/2006  . HEARING IMPAIRMENT 12/08/2006  . HYPERTENSION 12/08/2006  . BENIGN PROSTATIC HYPERTROPHY 12/08/2006  . ABRASION, LOWER ARM W/INFECTION 12/08/2006  . HX, PERSONAL, HEALTH HAZARD NOS 12/08/2006    Medications Prior to Visit Current Outpatient Prescriptions on File Prior to Visit  Medication Sig Dispense Refill  . aspirin 81 MG tablet Take 81 mg by mouth. Take one tab twice a week       . cloNIDine (CATAPRES) 0.1 MG tablet TAKE ONE TABLET BY MOUTH TWICE DAILY  60 tablet  5  . CRESTOR 20 MG tablet TAKE ONE TABLET BY MOUTH AT BEDTIME  30 tablet  5  . dorzolamide-timolol (COSOPT) 22.3-6.8 MG/ML ophthalmic solution 1 drop 2 (two) times daily.        Marland Kitchen MATZIM LA 420 MG 24 hr tablet TAKE ONE TABLET BY MOUTH EVERY DAY  30 tablet  3  . metoprolol succinate (TOPROL-XL) 25 MG 24 hr tablet Take 1 tablet (25 mg total) by mouth daily. Take with or immediately following a meal.  30 tablet  12  . nitroGLYCERIN (NITROSTAT) 0.4 MG SL tablet Place 1 tablet (0.4 mg total) under the tongue every 5 (five) minutes as  needed.  25 tablet  12  . omeprazole (PRILOSEC) 40 MG capsule Take 1 capsule (40 mg total) by mouth daily.  30 capsule  11  . prednisoLONE acetate (PRED FORTE) 1 % ophthalmic suspension Place 1 drop into both eyes daily.        . tadalafil (CIALIS) 20 MG tablet Take 1 tablet (20 mg total) by mouth daily as needed.  10 tablet  2   No current facility-administered medications on file prior to visit.    Current Medications (verified) Current Outpatient Prescriptions  Medication Sig Dispense Refill  . aspirin 81 MG tablet Take 81 mg by mouth. Take one tab twice a week       . cloNIDine (CATAPRES) 0.1 MG tablet TAKE ONE TABLET BY MOUTH TWICE DAILY  60 tablet  5  . CRESTOR 20 MG tablet TAKE ONE TABLET BY MOUTH AT BEDTIME  30 tablet  5  . dorzolamide-timolol (COSOPT) 22.3-6.8 MG/ML ophthalmic solution 1 drop 2 (two) times daily.        Marland Kitchen MATZIM LA 420 MG 24 hr tablet TAKE ONE TABLET BY MOUTH EVERY DAY  30 tablet  3  . metoprolol succinate (TOPROL-XL) 25 MG 24 hr tablet Take 1 tablet (25 mg total) by mouth daily. Take with or immediately following a meal.  30 tablet  12  . nitroGLYCERIN (NITROSTAT) 0.4 MG SL tablet Place 1 tablet (0.4  mg total) under the tongue every 5 (five) minutes as needed.  25 tablet  12  . omeprazole (PRILOSEC) 40 MG capsule Take 1 capsule (40 mg total) by mouth daily.  30 capsule  11  . prednisoLONE acetate (PRED FORTE) 1 % ophthalmic suspension Place 1 drop into both eyes daily.        . tadalafil (CIALIS) 20 MG tablet Take 1 tablet (20 mg total) by mouth daily as needed.  10 tablet  2   No current facility-administered medications for this visit.     Allergies (verified) Review of patient's allergies indicates no known allergies.   PAST HISTORY  Family History Family History  Problem Relation Age of Onset  . Coronary artery disease Mother   . Hypertension Mother   . Heart disease Mother   . Diabetes Sister   . Heart disease Sister 72    died during cabg  .  Hypertension Daughter   . Hypertension Son     Social History History  Substance Use Topics  . Smoking status: Former Smoker -- 0.30 packs/day for 20 years    Types: Cigarettes    Quit date: 02/22/1993  . Smokeless tobacco: Never Used  . Alcohol Use: No    Are there smokers in your home (other than you)?  No  Risk Factors Current exercise habits: Home exercise routine includes bike, stair step, situps, pull ups .  Dietary issues discussed: na   Cardiac risk factors: advanced age (older than 48 for men, 35 for women), dyslipidemia, hypertension and male gender.  Depression Screen (Note: if answer to either of the following is "Yes", a more complete depression screening is indicated)   Q1: Over the past two weeks, have you felt down, depressed or hopeless? No  Q2: Over the past two weeks, have you felt little interest or pleasure in doing things? No  Have you lost interest or pleasure in daily life? No  Do you often feel hopeless? No  Do you cry easily over simple problems? No  Activities of Daily Living In your present state of health, do you have any difficulty performing the following activities?:  Driving? No Managing money?  No Feeding yourself? No Getting from bed to chair? No Climbing a flight of stairs? No Preparing food and eating?: No Bathing or showering? No Getting dressed: No Getting to the toilet? No Using the toilet:No Moving around from place to place: No In the past year have you fallen or had a near fall?:No   Are you sexually active?  Yes  Do you have more than one partner?  No  Hearing Difficulties: No Do you often ask people to speak up or repeat themselves? No Do you experience ringing or noises in your ears? No Do you have difficulty understanding soft or whispered voices? No   Do you feel that you have a problem with memory? No  Do you often misplace items? No  Do you feel safe at home?  No  Cognitive Testing  Alert? Yes  Normal  Appearance?Yes  Oriented to person? Yes  Place? Yes   Time? Yes  Recall of three objects?  Yes  Can perform simple calculations? Yes  Displays appropriate judgment?Yes  Can read the correct time from a watch face?Yes   Advanced Directives have been discussed with the patient? Yes   List the Names of Other Physician/Practitioners you currently use: 1.  opth--[facility], [facility] 2  Dentist-- Roselyn Reef any recent Medical Services you may  have received from other than [facility] providers in the past year (date may be approximate).  Immunization History  Administered Date(s) Administered  . Influenza Whole 08/03/2007    Screening Tests Health Maintenance  Topic Date Due  . Influenza Vaccine  05/14/2013  . Tetanus/tdap  02/23/2015  . Colonoscopy  04/05/2016  . Pneumococcal Polysaccharide Vaccine Age 39 And Over  Completed  . Zostavax  Addressed    All answers were reviewed with the patient and necessary referrals were made:  Loreen Freud, DO   04/02/2013   History reviewed:  He  has a past medical history of CAD in native artery; Hypertension; Hyperlipidemia; Prostatic hypertrophy, benign; Glaucoma; PSA elevation; Mole (skin); Abnormal EKG; Renal insufficiency; Weight loss; Hip pain, left; Routine general medical examination at a health care facility; Unspecified personal history presenting hazards to health; Abrasion; and Hearing impairment. He  does not have any pertinent problems on file. He  has past surgical history that includes Prostate biopsy; Cholecystectomy (03/2010); Colonoscopy; Tonsilectomy, adenoidectomy, bilateral myringotomy and tubes; removal of cyst; Shoulder surgery; and Bladder surgery. His family history includes Coronary artery disease in his mother; Diabetes in his sister; Heart disease in his mother; Heart disease (age of onset: 55) in his sister; and Hypertension in his daughter, mother, and son. He  reports that he quit smoking about 20 years ago. His smoking use  included Cigarettes. He has a 6 pack-year smoking history. He has never used smokeless tobacco. He reports that he does not drink alcohol or use illicit drugs. He has a current medication list which includes the following prescription(s): aspirin, clonidine, crestor, dorzolamide-timolol, matzim la, metoprolol succinate, nitroglycerin, omeprazole, prednisolone acetate, and tadalafil. Current Outpatient Prescriptions on File Prior to Visit  Medication Sig Dispense Refill  . aspirin 81 MG tablet Take 81 mg by mouth. Take one tab twice a week       . cloNIDine (CATAPRES) 0.1 MG tablet TAKE ONE TABLET BY MOUTH TWICE DAILY  60 tablet  5  . CRESTOR 20 MG tablet TAKE ONE TABLET BY MOUTH AT BEDTIME  30 tablet  5  . dorzolamide-timolol (COSOPT) 22.3-6.8 MG/ML ophthalmic solution 1 drop 2 (two) times daily.        Marland Kitchen MATZIM LA 420 MG 24 hr tablet TAKE ONE TABLET BY MOUTH EVERY DAY  30 tablet  3  . metoprolol succinate (TOPROL-XL) 25 MG 24 hr tablet Take 1 tablet (25 mg total) by mouth daily. Take with or immediately following a meal.  30 tablet  12  . nitroGLYCERIN (NITROSTAT) 0.4 MG SL tablet Place 1 tablet (0.4 mg total) under the tongue every 5 (five) minutes as needed.  25 tablet  12  . omeprazole (PRILOSEC) 40 MG capsule Take 1 capsule (40 mg total) by mouth daily.  30 capsule  11  . prednisoLONE acetate (PRED FORTE) 1 % ophthalmic suspension Place 1 drop into both eyes daily.        . tadalafil (CIALIS) 20 MG tablet Take 1 tablet (20 mg total) by mouth daily as needed.  10 tablet  2   No current facility-administered medications on file prior to visit.   He has No Known Allergies.  Review of Systems  Review of Systems  Constitutional: Negative for activity change, appetite change and fatigue.  HENT: Negative for hearing loss, congestion, tinnitus and ear discharge.   Eyes: Negative for visual disturbance (see optho q1y -- vision corrected to 20/20 with glasses).  Respiratory: Negative for cough,  chest tightness and  shortness of breath.   Cardiovascular: Negative for chest pain, palpitations and leg swelling.  Gastrointestinal: Negative for abdominal pain, diarrhea, constipation and abdominal distention.  Genitourinary: Negative for urgency, frequency, decreased urine volume and difficulty urinating.  Musculoskeletal: Negative for back pain, arthralgias and gait problem.  Skin: Negative for color change, pallor and rash.  Neurological: Negative for dizziness, light-headedness, numbness and headaches.  Hematological: Negative for adenopathy. Does not bruise/bleed easily.  Psychiatric/Behavioral: Negative for suicidal ideas, confusion, sleep disturbance, self-injury, dysphoric mood, decreased concentration and agitation.  Pt is able to read and write and can do all ADLs No risk for falling No abuse/ violence in home    Objective:     Vision by Snellen chart: opht Blood pressure 134/76, pulse 50, temperature 98.4 F (36.9 C), temperature source Oral, height 5' 8.5" (1.74 m), weight 187 lb 3.2 oz (84.913 kg), SpO2 98.00%. Body mass index is 28.05 kg/(m^2).  BP 134/76  Pulse 50  Temp(Src) 98.4 F (36.9 C) (Oral)  Ht 5' 8.5" (1.74 m)  Wt 187 lb 3.2 oz (84.913 kg)  BMI 28.05 kg/m2  SpO2 98% General appearance: alert, cooperative, appears stated age and no distress Head: Normocephalic, without obvious abnormality, atraumatic Eyes: conjunctivae/corneas clear. PERRL, EOM's intact. Fundi benign. Ears: normal TM's and external ear canals both ears Nose: Nares normal. Septum midline. Mucosa normal. No drainage or sinus tenderness. Throat: lips, mucosa, and tongue normal; teeth and gums normal Neck: no adenopathy, no carotid bruit, no JVD, supple, symmetrical, trachea midline and thyroid not enlarged, symmetric, no tenderness/mass/nodules Back: symmetric, no curvature. ROM normal. No CVA tenderness. Lungs: clear to auscultation bilaterally Chest wall: no tenderness Heart: regular  rate and rhythm, S1, S2 normal, no murmur, click, rub or gallop Abdomen: soft, non-tender; bowel sounds normal; no masses,  no organomegaly Male genitalia: normal, penis: no lesions or discharge. testes: no masses or tenderness. no hernias Rectal: soft brown guaiac negative stool noted and prostate enlarged Extremities: extremities normal, atraumatic, no cyanosis or edema Pulses: 2+ and symmetric Skin: Skin color, texture, turgor normal. No rashes or lesions Lymph nodes: Cervical, supraclavicular, and axillary nodes normal. Neurologic: Alert and oriented X 3, normal strength and tone. Normal symmetric reflexes. Normal coordination and gait Psych-- no depression,, no anxiety      Assessment:     cpe      Plan:     During the course of the visit the patient was educated and counseled about appropriate screening and preventive services including:    Pneumococcal vaccine   Influenza vaccine  Td vaccine  Prostate cancer screening  Colorectal cancer screening  Diabetes screening  Glaucoma screening  Advanced directives: has an advanced directive - a copy HAS NOT been provided.  Diet review for nutrition referral? Yes ____  Not Indicated _x___   Patient Instructions (the written plan) was given to the patient.  Medicare Attestation I have personally reviewed: The patient's medical and social history Their use of alcohol, tobacco or illicit drugs Their current medications and supplements The patient's functional ability including ADLs,fall risks, home safety risks, cognitive, and hearing and visual impairment Diet and physical activities Evidence for depression or mood disorders  The patient's weight, height, BMI, and visual acuity have been recorded in the chart.  I have made referrals, counseling, and provided education to the patient based on review of the above and I have provided the patient with a written personalized care plan for preventive services.      Loreen Freud, DO  04/02/2013       

## 2013-04-02 NOTE — Patient Instructions (Signed)
Preventive Care for Adults, Male  A healthy lifestyle and preventive care can promote health and wellness. Preventive health guidelines for men include the following key practices:  · A routine yearly physical is a good way to check with your caregiver about your health and preventative screening. It is a chance to share any concerns and updates on your health, and to receive a thorough exam.  · Visit your dentist for a routine exam and preventative care every 6 months. Brush your teeth twice a day and floss once a day. Good oral hygiene prevents tooth decay and gum disease.  · The frequency of eye exams is based on your age, health, family medical history, use of contact lenses, and other factors. Follow your caregiver's recommendations for frequency of eye exams.  · Eat a healthy diet. Foods like vegetables, fruits, whole grains, low-fat dairy products, and lean protein foods contain the nutrients you need without too many calories. Decrease your intake of foods high in solid fats, added sugars, and salt. Eat the right amount of calories for you. Get information about a proper diet from your caregiver, if necessary.  · Regular physical exercise is one of the most important things you can do for your health. Most adults should get at least 150 minutes of moderate-intensity exercise (any activity that increases your heart rate and causes you to sweat) each week. In addition, most adults need muscle-strengthening exercises on 2 or more days a week.  · Maintain a healthy weight. The body mass index (BMI) is a screening tool to identify possible weight problems. It provides an estimate of body fat based on height and weight. Your caregiver can help determine your BMI, and can help you achieve or maintain a healthy weight. For adults 20 years and older:  · A BMI below 18.5 is considered underweight.  · A BMI of 18.5 to 24.9 is normal.  · A BMI of 25 to 29.9 is considered overweight.  · A BMI of 30 and above is  considered obese.  · Maintain normal blood lipids and cholesterol levels by exercising and minimizing your intake of saturated fat. Eat a balanced diet with plenty of fruit and vegetables. Blood tests for lipids and cholesterol should begin at age 20 and be repeated every 5 years. If your lipid or cholesterol levels are high, you are over 50, or you are a high risk for heart disease, you may need your cholesterol levels checked more frequently. Ongoing high lipid and cholesterol levels should be treated with medicines if diet and exercise are not effective.  · If you smoke, find out from your caregiver how to quit. If you do not use tobacco, do not start.  · If you choose to drink alcohol, do not exceed 2 drinks per day. One drink is considered to be 12 ounces (355 mL) of beer, 5 ounces (148 mL) of wine, or 1.5 ounces (44 mL) of liquor.  · Avoid use of street drugs. Do not share needles with anyone. Ask for help if you need support or instructions about stopping the use of drugs.  · High blood pressure causes heart disease and increases the risk of stroke. Your blood pressure should be checked at least every 1 to 2 years. Ongoing high blood pressure should be treated with medicines, if weight loss and exercise are not effective.  · If you are 45 to 77 years old, ask your caregiver if you should take aspirin to prevent heart disease.  · Diabetes screening involves taking   a blood sample to check your fasting blood sugar level. This should be done once every 3 years, after age 45, if you are within normal weight and without risk factors for diabetes. Testing should be considered at a younger age or be carried out more frequently if you are overweight and have at least 1 risk factor for diabetes.  · Colorectal cancer can be detected and often prevented. Most routine colorectal cancer screening begins at the age of 50 and continues through age 75. However, your caregiver may recommend screening at an earlier age if you  have risk factors for colon cancer. On a yearly basis, your caregiver may provide home test kits to check for hidden blood in the stool. Use of a small camera at the end of a tube, to directly examine the colon (sigmoidoscopy or colonoscopy), can detect the earliest forms of colorectal cancer. Talk to your caregiver about this at age 50, when routine screening begins.  Direct examination of the colon should be repeated every 5 to 10 years through age 75, unless early forms of pre-cancerous polyps or small growths are found.  · Hepatitis C blood testing is recommended for all people born from 1945 through 1965 and any individual with known risks for hepatitis C.  · Practice safe sex. Use condoms and avoid high-risk sexual practices to reduce the spread of sexually transmitted infections (STIs). STIs include gonorrhea, chlamydia, syphilis, trichomonas, herpes, HPV, and human immunodeficiency virus (HIV). Herpes, HIV, and HPV are viral illnesses that have no cure. They can result in disability, cancer, and death.  · A one-time screening for abdominal aortic aneurysm (AAA) and surgical repair of large AAAs by sound wave imaging (ultrasonography) is recommended for ages 65 to 75 years who are current or former smokers.  · Healthy men should no longer receive prostate-specific antigen (PSA) blood tests as part of routine cancer screening. Consult with your caregiver about prostate cancer screening.  · Testicular cancer screening is not recommended for adult males who have no symptoms. Screening includes self-exam, caregiver exam, and other screening tests. Consult with your caregiver about any symptoms you have or any concerns you have about testicular cancer.  · Use sunscreen with skin protection factor (SPF) of 30 or more. Apply sunscreen liberally and repeatedly throughout the day. You should seek shade when your shadow is shorter than you. Protect yourself by wearing long sleeves, pants, a wide-brimmed hat, and  sunglasses year round, whenever you are outdoors.  · Once a month, do a whole body skin exam, using a mirror to look at the skin on your back. Notify your caregiver of new moles, moles that have irregular borders, moles that are larger than a pencil eraser, or moles that have changed in shape or color.  · Stay current with required immunizations.  · Influenza. You need a dose every fall (or winter). The composition of the flu vaccine changes each year, so being vaccinated once is not enough.  · Pneumococcal polysaccharide. You need 1 to 2 doses if you smoke cigarettes or if you have certain chronic medical conditions. You need 1 dose at age 65 (or older) if you have never been vaccinated.  · Tetanus, diphtheria, pertussis (Tdap, Td). Get 1 dose of Tdap vaccine if you are younger than age 65 years, are over 65 and have contact with an infant, are a healthcare worker, or simply want to be protected from whooping cough. After that, you need a Td booster dose every 10 years. Consult your   caregiver if you have not had at least 3 tetanus and diphtheria-containing shots sometime in your life or have a deep or dirty wound.  · HPV. This vaccine is recommended for males 13 through 77 years of age. This vaccine may be given to men 22 through 77 years of age who have not completed the 3 dose series. It is recommended for men through age 26 who have sex with men or whose immune system is weakened because of HIV infection, other illness, or medications. The vaccine is given in 3 doses over 6 months.  · Measles, mumps, rubella (MMR). You need at least 1 dose of MMR if you were born in 1957 or later. You may also need a 2nd dose.  · Meningococcal. If you are age 19 to 21 years and a first-year college student living in a residence hall, or have one of several medical conditions, you need to get vaccinated against meningococcal disease. You may also need additional booster doses.  · Zoster (shingles). If you are age 60 years or  older, you should get this vaccine.  · Varicella (chickenpox). If you have never had chickenpox or you were vaccinated but received only 1 dose, talk to your caregiver to find out if you need this vaccine.  · Hepatitis A. You need this vaccine if you have a specific risk factor for hepatitis A virus infection, or you simply wish to be protected from this disease. The vaccine is usually given as 2 doses, 6 to 18 months apart.  · Hepatitis B. You need this vaccine if you have a specific risk factor for hepatitis B virus infection or you simply wish to be protected from this disease. The vaccine is given in 3 doses, usually over 6 months.  Preventative Service / Frequency  Ages 19 to 39  · Blood pressure check.** / Every 1 to 2 years.  · Lipid and cholesterol check.** / Every 5 years beginning at age 20.  · Hepatitis C blood test.** / For any individual with known risks for hepatitis C.  · Skin self-exam. / Monthly.  · Influenza immunization.** / Every year.  · Pneumococcal polysaccharide immunization.** / 1 to 2 doses if you smoke cigarettes or if you have certain chronic medical conditions.  · Tetanus, diphtheria, pertussis (Tdap,Td) immunization. / A one-time dose of Tdap vaccine. After that, you need a Td booster dose every 10 years.  · HPV immunization. / 3 doses over 6 months, if 26 and younger.  · Measles, mumps, rubella (MMR) immunization. / You need at least 1 dose of MMR if you were born in 1957 or later. You may also need a 2nd dose.  · Meningococcal immunization. / 1 dose if you are age 19 to 21 years and a first-year college student living in a residence hall, or have one of several medical conditions, you need to get vaccinated against meningococcal disease. You may also need additional booster doses.  · Varicella immunization.** / Consult your caregiver.  · Hepatitis A immunization.** / Consult your caregiver. 2 doses, 6 to 18 months apart.  · Hepatitis B immunization.** / Consult your caregiver. 3 doses  usually over 6 months.  Ages 40 to 64  · Blood pressure check.** / Every 1 to 2 years.  · Lipid and cholesterol check.** / Every 5 years beginning at age 20.  · Fecal occult blood test (FOBT) of stool. / Every year beginning at age 50 and continuing until age 75. You may not have   to do this test if you get colonoscopy every 10 years.  · Flexible sigmoidoscopy** or colonoscopy.** / Every 5 years for a flexible sigmoidoscopy or every 10 years for a colonoscopy beginning at age 50 and continuing until age 75.  · Hepatitis C blood test.** / For all people born from 1945 through 1965 and any individual with known risks for hepatitis C.  · Skin self-exam. / Monthly.  · Influenza immunization.** / Every year.  · Pneumococcal polysaccharide immunization.** / 1 to 2 doses if you smoke cigarettes or if you have certain chronic medical conditions.  · Tetanus, diphtheria, pertussis (Tdap/Td) immunization.** / A one-time dose of Tdap vaccine. After that, you need a Td booster dose every 10 years.  · Measles, mumps, rubella (MMR) immunization.  / You need at least 1 dose of MMR if you were born in 1957 or later. You may also need a 2nd dose.  · Varicella immunization.**/ Consult your caregiver.  · Meningococcal immunization.** / Consult your caregiver.  · Hepatitis A immunization.** / Consult your caregiver. 2 doses, 6 to 18 months apart.  · Hepatitis B immunization.** / Consult your caregiver. 3 doses, usually over 6 months.  Ages 65 and over  · Blood pressure check.** / Every 1 to 2 years.  · Lipid and cholesterol check.**/ Every 5 years beginning at age 20.  · Fecal occult blood test (FOBT) of stool. / Every year beginning at age 50 and continuing until age 75. You may not have to do this test if you get colonoscopy every 10 years.  · Flexible sigmoidoscopy** or colonoscopy.** / Every 5 years for a flexible sigmoidoscopy or every 10 years for a colonoscopy beginning at age 50 and continuing until age 75.  · Hepatitis C blood  test.** / For all people born from 1945 through 1965 and any individual with known risks for hepatitis C.  · Abdominal aortic aneurysm (AAA) screening.** / A one-time screening for ages 65 to 75 years who are current or former smokers.  · Skin self-exam. / Monthly.  · Influenza immunization.** / Every year.  · Pneumococcal polysaccharide immunization.** / 1 dose at age 65 (or older) if you have never been vaccinated.  · Tetanus, diphtheria, pertussis (Tdap, Td) immunization. / A one-time dose of Tdap vaccine if you are over 65 and have contact with an infant, are a healthcare worker, or simply want to be protected from whooping cough. After that, you need a Td booster dose every 10 years.  · Varicella immunization. ** / Consult your caregiver.  · Meningococcal immunization.** / Consult your caregiver.  · Hepatitis A immunization. ** / Consult your caregiver. 2 doses, 6 to 18 months apart.  · Hepatitis B immunization.** / Check with your caregiver. 3 doses, usually over 6 months.  **Family history and personal history of risk and conditions may change your caregiver's recommendations.  Document Released: 10/26/2001 Document Revised: 11/22/2011 Document Reviewed: 01/25/2011  [employer]® Patient Information ©2014 [employer].

## 2013-04-03 LAB — POCT URINALYSIS DIPSTICK
Bilirubin, UA: NEGATIVE
Ketones, UA: NEGATIVE
Leukocytes, UA: NEGATIVE
Nitrite, UA: NEGATIVE
Protein, UA: NEGATIVE

## 2013-04-06 ENCOUNTER — Telehealth: Payer: Self-pay

## 2013-04-06 DIAGNOSIS — R809 Proteinuria, unspecified: Secondary | ICD-10-CM

## 2013-04-06 NOTE — Telephone Encounter (Signed)
Message copied by Arnette Norris on Fri Apr 06, 2013  4:33 PM ------      Message from: Lelon Perla      Created: Tue Apr 03, 2013  1:07 PM       PSA--- elevated--even more than last year---- [facility] and pt should f/u urology      Cholesterol--- LDL goal < 100,  HDL >40,  TG < 150.  Diet and exercise will increase HDL and decrease LDL and TG.  Fish,  Fish Oil, Flaxseed oil will also help increase the HDL and decrease Triglycerides.   Recheck labs in 6 months      microalbumin elevated.---- need 24 h urine for protein             ------

## 2013-04-06 NOTE — Telephone Encounter (Signed)
Discussed with patient and Order is in , Labs faxed to Dr. Lindley Magnus    KP

## 2013-04-10 NOTE — Addendum Note (Signed)
Addended by: Silvio Pate D on: 04/10/2013 04:49 PM   Modules accepted: Orders

## 2013-04-23 ENCOUNTER — Other Ambulatory Visit: Payer: Self-pay | Admitting: Family Medicine

## 2013-04-26 ENCOUNTER — Other Ambulatory Visit: Payer: Self-pay | Admitting: Family Medicine

## 2013-04-27 ENCOUNTER — Encounter: Payer: Self-pay | Admitting: Cardiology

## 2013-04-27 ENCOUNTER — Ambulatory Visit (INDEPENDENT_AMBULATORY_CARE_PROVIDER_SITE_OTHER): Payer: Federal, State, Local not specified - PPO | Admitting: Cardiology

## 2013-04-27 VITALS — BP 156/84 | HR 53 | Ht 68.0 in | Wt 183.1 lb

## 2013-04-27 DIAGNOSIS — I2510 Atherosclerotic heart disease of native coronary artery without angina pectoris: Secondary | ICD-10-CM

## 2013-04-27 DIAGNOSIS — I10 Essential (primary) hypertension: Secondary | ICD-10-CM

## 2013-04-27 NOTE — Progress Notes (Signed)
HPI The patient presents for evaluation. He previously saw Dr. Daleen Squibb. He has a history of a mildly reduced ejection fraction but I suspect is related to hypertension. He did have a cath 2005 with mild nonobstructive coronary disease. Since he was last seen he has done well. He exercises routinely. With this he denies any cardiovascular symptoms. The patient denies any new symptoms such as chest discomfort, neck or arm discomfort. There has been no new shortness of breath, PND or orthopnea. There have been no reported palpitations, presyncope or syncope.  No Known Allergies  Current Outpatient Prescriptions  Medication Sig Dispense Refill  . aspirin 81 MG tablet Take 81 mg by mouth. Take one tab twice a week       . cloNIDine (CATAPRES) 0.1 MG tablet TAKE ONE TABLET BY MOUTH TWICE DAILY  60 tablet  5  . CRESTOR 20 MG tablet TAKE ONE TABLET BY MOUTH AT BEDTIME  30 tablet  5  . dorzolamide-timolol (COSOPT) 22.3-6.8 MG/ML ophthalmic solution 1 drop 2 (two) times daily.        Marland Kitchen MATZIM LA 420 MG 24 hr tablet TAKE ONE TABLET BY MOUTH EVERY DAY  30 tablet  3  . metoprolol succinate (TOPROL-XL) 25 MG 24 hr tablet Take 1 tablet (25 mg total) by mouth daily. Take with or immediately following a meal.  30 tablet  12  . nitroGLYCERIN (NITROSTAT) 0.4 MG SL tablet Place 1 tablet (0.4 mg total) under the tongue every 5 (five) minutes as needed.  25 tablet  12  . prednisoLONE acetate (PRED FORTE) 1 % ophthalmic suspension Place 1 drop into both eyes daily.        . sildenafil (VIAGRA) 100 MG tablet Take 0.5-1 tablets (50-100 mg total) by mouth daily as needed for erectile dysfunction.  3 tablet  0  . omeprazole (PRILOSEC) 40 MG capsule Take 1 capsule (40 mg total) by mouth daily.  30 capsule  11   No current facility-administered medications for this visit.    Past Medical History  Diagnosis Date  . CAD in native artery   . Hypertension   . Hyperlipidemia   . Prostatic hypertrophy, benign   . Glaucoma    . PSA elevation   . Mole (skin)   . Abnormal EKG   . Renal insufficiency   . Weight loss   . Hip pain, left   . Routine general medical examination at a health care facility   . Unspecified personal history presenting hazards to health     prostate bx  . Abrasion     lower arm; with infection   . Hearing impairment     Past Surgical History  Procedure Laterality Date  . Prostate biopsy    . Cholecystectomy  03/2010    central  surgery  . Colonoscopy    . Tonsilectomy, adenoidectomy, bilateral myringotomy and tubes    . Removal of cyst      right arm  . Shoulder surgery      cyst removed left shoulder  . Bladder surgery      ROS:  As stated in the HPI and negative for all other systems.  PHYSICAL EXAM BP 156/84  Pulse 53  Ht 5\' 8"  (1.727 m)  Wt 183 lb 1.9 oz (83.063 kg)  BMI 27.85 kg/m2 GENERAL:  Well appearing HEENT:  Pupils equal round and reactive, fundi not visualized, oral mucosa unremarkable NECK:  No jugular venous distention, waveform within normal limits, carotid upstroke brisk and  symmetric, no bruits, no thyromegaly LYMPHATICS:  No cervical, inguinal adenopathy LUNGS:  Clear to auscultation bilaterally BACK:  No CVA tenderness CHEST:  Unremarkable HEART:  PMI not displaced or sustained,S1 and S2 within normal limits, no S3, no S4, no clicks, no rubs, no murmurs ABD:  Flat, positive bowel sounds normal in frequency in pitch, no bruits, no rebound, no guarding, no midline pulsatile mass, no hepatomegaly, no splenomegaly EXT:  2 plus pulses throughout, no edema, no cyanosis no clubbing SKIN:  No rashes no nodules NEURO:  Cranial nerves II through XII grossly intact, motor grossly intact throughout PSYCH:  Cognitively intact, oriented to person place and time   EKG:  Sinus bradycardia, rate 53, axis within normal limits, intervals within normal limits, left trigger hypertrophy. T wave inversions consistent with repolarization changes and unchanged  from previous. 04/27/2013  ASSESSMENT AND PLAN  CAD:  This was mild and nonobstructive. No further cardiovascular testing is suggested.  HTN:  This is mildly elevated. However, recent readings have not been high. I will not change his medications.  CARDIOMYOPATHY:  He had an EF of 45%. He has not had an echocardiogram in several years and I will followup for repeat echocardiography. Further medication adjustments will be based on this.  DYSLIPIDEMIA:  His lipid profile recently was excellent. He will remain on the meds as listed.

## 2013-04-27 NOTE — Patient Instructions (Addendum)

## 2013-05-01 ENCOUNTER — Ambulatory Visit (HOSPITAL_COMMUNITY): Payer: Federal, State, Local not specified - PPO | Attending: Cardiology

## 2013-05-01 DIAGNOSIS — I428 Other cardiomyopathies: Secondary | ICD-10-CM | POA: Insufficient documentation

## 2013-05-01 DIAGNOSIS — E785 Hyperlipidemia, unspecified: Secondary | ICD-10-CM | POA: Insufficient documentation

## 2013-05-01 DIAGNOSIS — I10 Essential (primary) hypertension: Secondary | ICD-10-CM | POA: Insufficient documentation

## 2013-05-01 DIAGNOSIS — I2510 Atherosclerotic heart disease of native coronary artery without angina pectoris: Secondary | ICD-10-CM | POA: Insufficient documentation

## 2013-05-01 NOTE — Progress Notes (Signed)
Echocardiogram performed.  

## 2013-05-10 ENCOUNTER — Other Ambulatory Visit: Payer: Self-pay | Admitting: Cardiology

## 2013-05-29 ENCOUNTER — Other Ambulatory Visit: Payer: Self-pay | Admitting: *Deleted

## 2013-05-29 MED ORDER — DILTIAZEM HCL ER COATED BEADS 420 MG PO TB24: ORAL_TABLET | ORAL | Status: DC

## 2013-05-29 NOTE — Telephone Encounter (Signed)
Rx was refilled for matzim 420 mg.  Ag cma

## 2013-09-24 ENCOUNTER — Other Ambulatory Visit: Payer: Self-pay | Admitting: Family Medicine

## 2013-09-25 ENCOUNTER — Other Ambulatory Visit: Payer: Self-pay | Admitting: Family Medicine

## 2013-09-28 ENCOUNTER — Ambulatory Visit (INDEPENDENT_AMBULATORY_CARE_PROVIDER_SITE_OTHER): Payer: Federal, State, Local not specified - PPO | Admitting: Family Medicine

## 2013-09-28 ENCOUNTER — Encounter: Payer: Self-pay | Admitting: Family Medicine

## 2013-09-28 VITALS — BP 150/82 | HR 57 | Temp 98.0°F | Wt 186.4 lb

## 2013-09-28 DIAGNOSIS — I2510 Atherosclerotic heart disease of native coronary artery without angina pectoris: Secondary | ICD-10-CM

## 2013-09-28 DIAGNOSIS — K3189 Other diseases of stomach and duodenum: Secondary | ICD-10-CM

## 2013-09-28 DIAGNOSIS — R1013 Epigastric pain: Secondary | ICD-10-CM

## 2013-09-28 DIAGNOSIS — E785 Hyperlipidemia, unspecified: Secondary | ICD-10-CM

## 2013-09-28 DIAGNOSIS — I10 Essential (primary) hypertension: Secondary | ICD-10-CM

## 2013-09-28 LAB — BASIC METABOLIC PANEL
BUN: 12 mg/dL (ref 6–23)
CALCIUM: 9.1 mg/dL (ref 8.4–10.5)
CO2: 24 mEq/L (ref 19–32)
CREATININE: 1.3 mg/dL (ref 0.4–1.5)
Chloride: 107 mEq/L (ref 96–112)
GFR: 69.59 mL/min (ref >60.00)
Glucose, Bld: 107 mg/dL — ABNORMAL HIGH (ref 70–99)
Potassium: 3.6 mEq/L (ref 3.5–5.1)
Sodium: 140 mEq/L (ref 135–145)

## 2013-09-28 LAB — HEPATIC FUNCTION PANEL
ALT: 19 U/L (ref 0–53)
AST: 20 U/L (ref 0–37)
Albumin: 3.7 g/dL (ref 3.5–5.2)
Alkaline Phosphatase: 64 U/L (ref 39–117)
Bilirubin, Direct: 0.1 mg/dL (ref 0.0–0.3)
Total Bilirubin: 0.9 mg/dL (ref 0.3–1.2)
Total Protein: 7.4 g/dL (ref 6.0–8.3)

## 2013-09-28 LAB — LIPID PANEL
Cholesterol: 116 mg/dL (ref 0–200)
HDL: 34.3 mg/dL — AB (ref >39.00)
LDL Cholesterol: 54 mg/dL (ref 0–99)
TRIGLYCERIDES: 139 mg/dL (ref 0.0–149.0)
Total CHOL/HDL Ratio: 3
VLDL: 27.8 mg/dL (ref 0.0–40.0)

## 2013-09-28 MED ORDER — METOPROLOL SUCCINATE ER 50 MG PO TB24: 50.0000 mg | ORAL_TABLET | Freq: Every day | ORAL | Status: DC

## 2013-09-28 MED ORDER — OMEPRAZOLE 40 MG PO CPDR: 40.0000 mg | DELAYED_RELEASE_CAPSULE | Freq: Every day | ORAL | Status: DC

## 2013-09-28 NOTE — Progress Notes (Signed)
  Subjective:    Patient here for follow-up of elevated blood pressure.  He is exercising and is adherent to a low-salt diet.  Blood pressure is well controlled at home. Cardiac symptoms: none. Patient denies: chest pain, chest pressure/discomfort, claudication, dyspnea, exertional chest pressure/discomfort, fatigue, irregular heart beat, lower extremity edema, near-syncope, orthopnea, palpitations, paroxysmal nocturnal dyspnea, syncope and tachypnea. Cardiovascular risk factors: advanced age (older than 52 for men, 53 for women), dyslipidemia, hypertension and male gender. Use of agents associated with hypertension: none. History of target organ damage: none.  The following portions of the patient's history were reviewed and updated as appropriate: allergies, current medications, past family history, past medical history, past social history, past surgical history and problem list.  Review of Systems Pertinent items are noted in HPI.     Objective:    BP 168/80  Pulse 57  Temp(Src) 98 F (36.7 C) (Oral)  Wt 186 lb 6.4 oz (84.55 kg)  SpO2 97% General appearance: alert, cooperative, appears stated age and no distress Throat: lips, mucosa, and tongue normal; teeth and gums normal Neck: no adenopathy, supple, symmetrical, trachea midline and thyroid not enlarged, symmetric, no tenderness/mass/nodules Lungs: clear to auscultation bilaterally Heart: S1, S2 normal and + murmur Extremities: extremities normal, atraumatic, no cyanosis or edema    Assessment:    Hypertension, stage 1.    Plan:    Medication: increase to toprol 50 mg . Dietary sodium restriction. Regular aerobic exercise. Check blood pressures 2-3 times weekly and record. Follow up: 3 months and as needed.

## 2013-09-28 NOTE — Progress Notes (Signed)
Pre visit review using our clinic review tool, if applicable. No additional management support is needed unless otherwise documented below in the visit note. 

## 2013-09-28 NOTE — Patient Instructions (Signed)

## 2013-09-30 NOTE — Assessment & Plan Note (Signed)
Check labs con't meds 

## 2013-09-30 NOTE — Assessment & Plan Note (Signed)
Elevated Inc toprol 50 mg

## 2013-09-30 NOTE — Assessment & Plan Note (Signed)
Per cardio 

## 2013-10-03 ENCOUNTER — Ambulatory Visit: Payer: Federal, State, Local not specified - PPO | Admitting: Family Medicine

## 2013-10-17 ENCOUNTER — Telehealth: Payer: Self-pay | Admitting: Family Medicine

## 2013-10-17 ENCOUNTER — Other Ambulatory Visit: Payer: Self-pay | Admitting: Family Medicine

## 2013-10-17 NOTE — Telephone Encounter (Signed)
Relevant patient education mailed to patient.  

## 2013-10-19 ENCOUNTER — Other Ambulatory Visit: Payer: Self-pay | Admitting: Family Medicine

## 2013-11-29 ENCOUNTER — Other Ambulatory Visit: Payer: Self-pay | Admitting: Family Medicine

## 2014-03-26 ENCOUNTER — Other Ambulatory Visit: Payer: Self-pay | Admitting: Family Medicine

## 2014-03-28 ENCOUNTER — Encounter: Payer: Self-pay | Admitting: Family Medicine

## 2014-03-28 ENCOUNTER — Ambulatory Visit (INDEPENDENT_AMBULATORY_CARE_PROVIDER_SITE_OTHER): Payer: Federal, State, Local not specified - PPO | Admitting: Family Medicine

## 2014-03-28 VITALS — BP 136/80 | HR 48 | Temp 98.0°F | Wt 182.0 lb

## 2014-03-28 DIAGNOSIS — I10 Essential (primary) hypertension: Secondary | ICD-10-CM

## 2014-03-28 DIAGNOSIS — E785 Hyperlipidemia, unspecified: Secondary | ICD-10-CM

## 2014-03-28 LAB — HEPATIC FUNCTION PANEL
ALBUMIN: 3.6 g/dL (ref 3.5–5.2)
ALT: 20 U/L (ref 0–53)
AST: 22 U/L (ref 0–37)
Alkaline Phosphatase: 55 U/L (ref 39–117)
Bilirubin, Direct: 0.1 mg/dL (ref 0.0–0.3)
TOTAL PROTEIN: 7.2 g/dL (ref 6.0–8.3)
Total Bilirubin: 0.8 mg/dL (ref 0.2–1.2)

## 2014-03-28 LAB — BASIC METABOLIC PANEL
BUN: 22 mg/dL (ref 6–23)
CALCIUM: 8.7 mg/dL (ref 8.4–10.5)
CO2: 23 meq/L (ref 19–32)
CREATININE: 1.3 mg/dL (ref 0.4–1.5)
Chloride: 110 mEq/L (ref 96–112)
GFR: 68.27 mL/min (ref >60.00)
Glucose, Bld: 118 mg/dL — ABNORMAL HIGH (ref 70–99)
Potassium: 3.6 mEq/L (ref 3.5–5.1)
Sodium: 139 mEq/L (ref 135–145)

## 2014-03-28 LAB — LIPID PANEL
CHOL/HDL RATIO: 3
CHOLESTEROL: 113 mg/dL (ref 0–200)
HDL: 34.1 mg/dL — ABNORMAL LOW (ref >39.00)
LDL Cholesterol: 60 mg/dL (ref 0–99)
NONHDL: 78.9
Triglycerides: 96 mg/dL (ref 0.0–149.0)
VLDL: 19.2 mg/dL (ref 0.0–40.0)

## 2014-03-28 NOTE — Progress Notes (Signed)
Pre visit review using our clinic review tool, if applicable. No additional management support is needed unless otherwise documented below in the visit note. 

## 2014-03-28 NOTE — Progress Notes (Signed)
  Subjective:    Patient here for follow-up of elevated blood pressure.  He is exercising and is adherent to a low-salt diet.  Blood pressure is well controlled at home. Cardiac symptoms: none. Patient denies: chest pain, chest pressure/discomfort, claudication, dyspnea, exertional chest pressure/discomfort, fatigue, irregular heart beat, lower extremity edema, near-syncope, orthopnea, palpitations, paroxysmal nocturnal dyspnea, syncope and tachypnea. Cardiovascular risk factors: advanced age (older than 13 for men, 49 for women), dyslipidemia, hypertension and male gender. Use of agents associated with hypertension: none. History of target organ damage: none.  The following portions of the patient's history were reviewed and updated as appropriate: allergies, current medications, past family history, past medical history, past social history, past surgical history and problem list.  Review of Systems Pertinent items are noted in HPI.     Objective:    BP 136/80  Pulse 48  Temp(Src) 98 F (36.7 C) (Oral)  Wt 182 lb (82.555 kg)  SpO2 98% General appearance: alert, cooperative, appears stated age and no distress Lungs: clear to auscultation bilaterally Heart: S1, S2 normal Extremities: extremities normal, atraumatic, no cyanosis or edema    Assessment:    Hypertension, normal blood pressure . Evidence of target organ damage: none.    Plan:    Medication: no change. Regular aerobic exercise. Check blood pressures 2-3 times weekly and record. Follow up: 6 months and as needed.   1. Other and unspecified hyperlipidemia Check labs - Basic metabolic panel - Hepatic function panel - Lipid panel  2. Essential hypertension stable - Basic metabolic panel - Hepatic function panel - Lipid panel

## 2014-03-28 NOTE — Patient Instructions (Signed)
Hypertension Hypertension, commonly called high blood pressure, is when the force of blood pumping through your arteries is too strong. Your arteries are the blood vessels that carry blood from your heart throughout your body. A blood pressure reading consists of a higher number over a lower number, such as 110/72. The higher number (systolic) is the pressure inside your arteries when your heart pumps. The lower number (diastolic) is the pressure inside your arteries when your heart relaxes. Ideally you want your blood pressure below 120/80. Hypertension forces your heart to work harder to pump blood. Your arteries may become narrow or stiff. Having hypertension puts you at risk for heart disease, stroke, and other problems.  RISK FACTORS Some risk factors for high blood pressure are controllable. Others are not.  Risk factors you cannot control include:   Race. You may be at higher risk if you are African American.  Age. Risk increases with age.  Gender. Men are at higher risk than women before age 45 years. After age 65, women are at higher risk than men. Risk factors you can control include:  Not getting enough exercise or physical activity.  Being overweight.  Getting too much fat, sugar, calories, or salt in your diet.  Drinking too much alcohol. SIGNS AND SYMPTOMS Hypertension does not usually cause signs or symptoms. Extremely high blood pressure (hypertensive crisis) may cause headache, anxiety, shortness of breath, and nosebleed. DIAGNOSIS  To check if you have hypertension, your health care provider will measure your blood pressure while you are seated, with your arm held at the level of your heart. It should be measured at least twice using the same arm. Certain conditions can cause a difference in blood pressure between your right and left arms. A blood pressure reading that is higher than normal on one occasion does not mean that you need treatment. If one blood pressure reading  is high, ask your health care provider about having it checked again. TREATMENT  Treating high blood pressure includes making lifestyle changes and possibly taking medication. Living a healthy lifestyle can help lower high blood pressure. You may need to change some of your habits. Lifestyle changes may include:  Following the DASH diet. This diet is high in fruits, vegetables, and whole grains. It is low in salt, red meat, and added sugars.  Getting at least 2 1/2 hours of brisk physical activity every week.  Losing weight if necessary.  Not smoking.  Limiting alcoholic beverages.  Learning ways to reduce stress. If lifestyle changes are not enough to get your blood pressure under control, your health care provider may prescribe medicine. You may need to take more than one. Work closely with your health care provider to understand the risks and benefits. HOME CARE INSTRUCTIONS  Have your blood pressure rechecked as directed by your health care provider.   Only take medicine as directed by your health care provider. Follow the directions carefully. Blood pressure medicines must be taken as prescribed. The medicine does not work as well when you skip doses. Skipping doses also puts you at risk for problems.   Do not smoke.   Monitor your blood pressure at home as directed by your health care provider. SEEK MEDICAL CARE IF:   You think you are having a reaction to medicines taken.  You have recurrent headaches or feel dizzy.  You have swelling in your ankles.  You have trouble with your vision. SEEK IMMEDIATE MEDICAL CARE IF:  You develop a severe headache or   confusion.  You have unusual weakness, numbness, or feel faint.  You have severe chest or abdominal pain.  You vomit repeatedly.  You have trouble breathing. MAKE SURE YOU:   Understand these instructions.  Will watch your condition.  Will get help right away if you are not doing well or get  worse. Document Released: 08/30/2005 Document Revised: 09/04/2013 Document Reviewed: 06/22/2013 ExitCare Patient Information 2015 [employer]. This information is not intended to replace advice given to you by your health care provider. Make sure you discuss any questions you have with your health care provider.  

## 2014-04-01 ENCOUNTER — Other Ambulatory Visit: Payer: Self-pay | Admitting: Family Medicine

## 2014-04-01 ENCOUNTER — Encounter: Payer: Self-pay | Admitting: General Practice

## 2014-04-01 DIAGNOSIS — R7309 Other abnormal glucose: Secondary | ICD-10-CM

## 2014-04-16 ENCOUNTER — Other Ambulatory Visit: Payer: Self-pay | Admitting: Family Medicine

## 2014-04-29 ENCOUNTER — Ambulatory Visit (INDEPENDENT_AMBULATORY_CARE_PROVIDER_SITE_OTHER): Payer: Federal, State, Local not specified - PPO | Admitting: Cardiology

## 2014-04-29 ENCOUNTER — Encounter: Payer: Self-pay | Admitting: Cardiology

## 2014-04-29 VITALS — BP 190/100 | HR 48 | Ht 68.0 in | Wt 179.0 lb

## 2014-04-29 DIAGNOSIS — I10 Essential (primary) hypertension: Secondary | ICD-10-CM

## 2014-04-29 NOTE — Patient Instructions (Signed)
Your physician recommends that you schedule a follow-up appointment in: 1 month to get your Blood pressure checked  Take your Blood every day and record it for two weeks and bring it in to [place] at your leisure

## 2014-04-29 NOTE — Progress Notes (Signed)
HPI The patient presents for follow up of a mildly reduced ejection fraction related to hypertension. He did have a cath 2005 with mild nonobstructive coronary disease.   I did an echo last year and the EF was well preserved.    Since he was last seen he has done well.  He denies any cardiovascular symptoms. The patient denies any new symptoms such as chest discomfort, neck or arm discomfort. There has been no new shortness of breath, PND or orthopnea. There have been no reported palpitations, presyncope or syncope.  Today the BP was very elevated.  However, he says that it is usually 130/90 at home.    No Known Allergies  Current Outpatient Prescriptions  Medication Sig Dispense Refill  . aspirin 81 MG tablet Take 81 mg by mouth. Take one tab twice a week       . cloNIDine (CATAPRES) 0.1 MG tablet TAKE ONE TABLET BY MOUTH TWICE DAILY  60 tablet  5  . CRESTOR 20 MG tablet TAKE ONE TABLET BY MOUTH AT BEDTIME  30 tablet  5  . diltiazem (MATZIM LA) 420 MG 24 hr tablet Take 1 tablet (420 mg total) by mouth daily.  30 tablet  5  . dorzolamide-timolol (COSOPT) 22.3-6.8 MG/ML ophthalmic solution 1 drop 2 (two) times daily.        . metoprolol succinate (TOPROL-XL) 50 MG 24 hr tablet TAKE 1 TAB DAILY , TAKE WITH OR IMMEDIATELY FOLLOWING A MEAL  30 tablet  1  . omeprazole (PRILOSEC) 40 MG capsule Take 1 capsule (40 mg total) by mouth daily.  30 capsule  11  . sildenafil (VIAGRA) 100 MG tablet Take 0.5-1 tablets (50-100 mg total) by mouth daily as needed for erectile dysfunction.  3 tablet  0  . Travoprost, BAK Free, (TRAVATAN) 0.004 % SOLN ophthalmic solution Place 2 drops into both eyes at bedtime.       No current facility-administered medications for this visit.    Past Medical History  Diagnosis Date  . CAD in native artery     Nonobstructive 2005  . Hypertension   . Hyperlipidemia   . Prostatic hypertrophy, benign   . Glaucoma   . PSA elevation   . Abnormal EKG   . Renal  insufficiency   . Routine general medical examination at a health care facility   . Hearing impairment   . Elevated PSA     Past Surgical History  Procedure Laterality Date  . Transurethral resection of prostate    . Cholecystectomy  03/2010    [facility] [place] [facility]  . Colonoscopy    . Tonsilectomy, adenoidectomy, bilateral myringotomy and tubes    . Removal of cyst      right arm  . Shoulder surgery      cyst removed left shoulder  . Bladder surgery    . Cataract extraction      ROS:  As stated in the HPI and negative for all other systems.  PHYSICAL EXAM BP 190/100  Pulse 48  Ht 5\' 8"  (1.727 m)  Wt 179 lb (81.194 kg)  BMI 27.22 kg/m2 GENERAL:  Well appearing NECK:  No jugular venous distention, waveform within normal limits, carotid upstroke brisk and symmetric, no bruits, no thyromegaly LYMPHATICS:  No cervical, inguinal adenopathy LUNGS:  Clear to auscultation bilaterally CHEST:  Unremarkable HEART:  PMI not displaced or sustained,S1 and S2 within normal limits, no S3, no S4, no clicks, no rubs, no murmurs ABD:  Flat, positive bowel  sounds normal in frequency in pitch, no bruits, no rebound, no guarding, no midline pulsatile mass, no hepatomegaly, no splenomegaly EXT:  2 plus pulses throughout, no edema, no cyanosis no clubbing SKIN:  No rashes no nodules   EKG:  Sinus bradycardia, rate 48, axis within normal limits, intervals within normal limits, left ventricular hypertrophy. T wave inversions consistent with repolarization changes and unchanged from previous. 04/29/2014  ASSESSMENT AND PLAN  HTN:  His BP is quite high.  However, I repeated it and it was lower in the office before he left.  He is given instructions about keeping a BP diary for the next two weeks with readings twice daily.  I will then review this and adjust meds as listed. .  CAD:  This was mild and nonobstructive. No further cardiovascular testing is suggested.  CARDIOMYOPATHY:  His EF was  normal last year.  No change in therapy is indicated.   DYSLIPIDEMIA:  His lipid profile last month was excellent. He will remain on the meds as listed.  ABNORMAL EKG:  He has a markedly abnormal EKG.  This however, is repolarization and unchanged.  I gave him a copy of the EKG.

## 2014-05-21 ENCOUNTER — Other Ambulatory Visit: Payer: Self-pay | Admitting: Family Medicine

## 2014-05-27 ENCOUNTER — Other Ambulatory Visit: Payer: Self-pay | Admitting: Family Medicine

## 2014-05-31 ENCOUNTER — Ambulatory Visit (INDEPENDENT_AMBULATORY_CARE_PROVIDER_SITE_OTHER): Payer: Federal, State, Local not specified - PPO | Admitting: Pharmacist Clinician (PhC)/ Clinical Pharmacy Specialist

## 2014-05-31 VITALS — BP 158/78 | HR 52 | Ht 68.0 in | Wt 179.1 lb

## 2014-05-31 DIAGNOSIS — I10 Essential (primary) hypertension: Secondary | ICD-10-CM

## 2014-05-31 NOTE — Patient Instructions (Signed)
Return for a a follow up appointment in 2 months  Your blood pressure today is 158/78  Check your blood pressure at home daily and keep record of the readings.  Take your BP meds as follows: continue with clonidine twice daily, metoprolol each morning and diltiazem each night  Bring all of your meds, your BP cuff and your record of home blood pressures to your next appointment.  Exercise as you're able, try to walk approximately 30 minutes per day.  Keep salt intake to a minimum, especially watch canned and prepared boxed foods.  Eat more fresh fruits and vegetables and fewer canned items.  Avoid eating in fast food restaurants.    HOW TO TAKE YOUR BLOOD PRESSURE:   Rest 5 minutes before taking your blood pressure.    Don't smoke or drink caffeinated beverages for at least 30 minutes before.   Take your blood pressure before (not after) you eat.   Sit comfortably with your back supported and both feet on the floor (don't cross your legs).   Elevate your arm to heart level on a table or a desk.   Use the proper sized cuff. It should fit smoothly and snugly around your bare upper arm. There should be enough room to slip a fingertip under the cuff. The bottom edge of the cuff should be 1 inch above the crease of the elbow.   Ideally, take 3 measurements at one sitting and record the average.

## 2014-06-03 ENCOUNTER — Encounter: Payer: Self-pay | Admitting: Pharmacist Clinician (PhC)/ Clinical Pharmacy Specialist

## 2014-06-03 NOTE — Assessment & Plan Note (Signed)
Today his blood pressure is elevated at 158/78.  Standing dropped to 142/70.  He reports no dizziness or pre-syncope with positional changes.  We discussed the possibility of switching the clonidine to an ACEI or ARB to reduce side effects, but I would first like to see how well his home meter really is working.  I have asked that he continue to check daily home readings and will have him bring those to me.  He also needs to bring in his home BP meter so we can compare its accuracy to that of our office readings.  I will see him in 2 months for follow up.  I praised his exercise regimen, encouraging him to keep with it, as well as continue to work on a low sodium diet.

## 2014-06-03 NOTE — Progress Notes (Signed)
06/03/2014 [name] [name] 12/01/1931 [id number]   HPI:  [name] [name] is a 80 y.o. male patient of Dr Percival Spanish, with a PMH below who presents today for a blood pressure check.  His cardiovascular history includes a cath in 2005, showing only mild non-obstructive coronary disease and an ECHO in 2014 showing well preserved EF.  He was in for an annual check with Dr. Percival Spanish and found to have a BP of 190/100.  He states home readings are usually in the 710-626R systolic, and he recently brought in a sheet of paper with 2 weeks readings.  However I was unable to find this to verify his information.  He did state that there were a few higher readings, usually after working outside, but nothing like the 190/100 from his last OV.   His current medications for blood pressure include clonidine 0.1mg  twice daily,  Diltiazem 420mg  once daily in the evening and metoprolol succinate 50mg  once daily in the mornings.  He does note that the clonidine makes him somewhat drowsy.   He apparently has not been tried on an ACEI or ARB.  His SCr is calculated at 52.  Mr Glomski's family history includes heart disease and hypertension in his mother as well as heart disease in 1 sister.  He has 3 sons and 1 daughter, of which one son and the daughter both have hypertension.   He states his father, who died of pneumonia, actually had more problems with hypotension.    He quit smoking in 1994 and stopped alcohol the following year.  He does not drink caffeine regularly and has been cutting [name] on his salt intake in the past year.  He is quite active, using both a stepper and stationary bike daily, as well as doing sit-ups, spending 45-60 minutes daily, sometimes twice daily.    Current Outpatient Prescriptions  Medication Sig Dispense Refill  . aspirin 81 MG tablet Take 81 mg by mouth. Take one tab twice a week       . cloNIDine (CATAPRES) 0.1 MG tablet TAKE ONE TABLET BY MOUTH TWICE DAILY  60 tablet  5  . CRESTOR 20 MG  tablet TAKE ONE TABLET BY MOUTH AT BEDTIME  30 tablet  5  . dorzolamide-timolol (COSOPT) 22.3-6.8 MG/ML ophthalmic solution 1 drop 2 (two) times daily.        Marland Kitchen MATZIM LA 420 MG 24 hr tablet TAKE ONE TABLET BY MOUTH ONCE DAILY  30 tablet  5  . metoprolol succinate (TOPROL-XL) 50 MG 24 hr tablet TAKE ONE TABLET BY MOUTH ONCE DAILY. TAKE WITH OR IMMEDIATELY FOLLOWING A MEAL  30 tablet  5  . omeprazole (PRILOSEC) 40 MG capsule Take 1 capsule (40 mg total) by mouth daily.  30 capsule  11  . sildenafil (VIAGRA) 100 MG tablet Take 0.5-1 tablets (50-100 mg total) by mouth daily as needed for erectile dysfunction.  3 tablet  0  . Travoprost, BAK Free, (TRAVATAN) 0.004 % SOLN ophthalmic solution Place 2 drops into both eyes at bedtime.       No current facility-administered medications for this visit.    No Known Allergies  Past Medical History  Diagnosis Date  . CAD in native artery     Nonobstructive 2005  . Hypertension   . Hyperlipidemia   . Prostatic hypertrophy, benign   . Glaucoma   . PSA elevation   . Abnormal EKG   . Renal insufficiency   . Routine general medical examination at  a health care facility   . Hearing impairment   . Elevated PSA     Blood pressure 158/78, pulse 52, height 5\' 8"  (1.727 m), weight 179 lb 1.6 oz (81.239 kg).   ASSESSMENT AND PLAN:  Tommy Medal PharmD CPP [facility]

## 2014-08-02 ENCOUNTER — Ambulatory Visit: Payer: Federal, State, Local not specified - PPO | Admitting: Pharmacist Clinician (PhC)/ Clinical Pharmacy Specialist

## 2014-08-19 ENCOUNTER — Ambulatory Visit (INDEPENDENT_AMBULATORY_CARE_PROVIDER_SITE_OTHER): Payer: Federal, State, Local not specified - PPO | Admitting: Pharmacist Clinician (PhC)/ Clinical Pharmacy Specialist

## 2014-08-19 ENCOUNTER — Encounter: Payer: Self-pay | Admitting: Pharmacist Clinician (PhC)/ Clinical Pharmacy Specialist

## 2014-08-19 VITALS — BP 140/70 | HR 44 | Ht 68.0 in | Wt 175.7 lb

## 2014-08-19 DIAGNOSIS — I10 Essential (primary) hypertension: Secondary | ICD-10-CM

## 2014-08-19 MED ORDER — METOPROLOL SUCCINATE ER 25 MG PO TB24: 25.0000 mg | ORAL_TABLET | Freq: Every day | ORAL | Status: DC

## 2014-08-19 NOTE — Patient Instructions (Signed)
Your blood pressure today is 140/70  (goal is <150/90).    Check your blood pressure at home several times each week and keep record of the readings.  If you notice the readings trend to >150/90 on a regular basis, call the office  Take your BP meds as follows: decrease metoprolol 50 mg to 1/2 tablet (25 mg) daily.  Keep all other meds the same  Bring all of your meds, your BP cuff and your record of home blood pressures to your next appointment.  Exercise as you're able, try to walk approximately 30 minutes per day.  Keep salt intake to a minimum, especially watch canned and prepared boxed foods.  Eat more fresh fruits and vegetables and fewer canned items.  Avoid eating in fast food restaurants.    HOW TO TAKE YOUR BLOOD PRESSURE: . Rest 5 minutes before taking your blood pressure. .  Don't smoke or drink caffeinated beverages for at least 30 minutes before. . Take your blood pressure before (not after) you eat. . Sit comfortably with your back supported and both feet on the floor (don't cross your legs). . Elevate your arm to heart level on a table or a desk. . Use the proper sized cuff. It should fit smoothly and snugly around your bare upper arm. There should be enough room to slip a fingertip under the cuff. The bottom edge of the cuff should be 1 inch above the crease of the elbow. . Ideally, take 3 measurements at one sitting and record the average.

## 2014-08-19 NOTE — Progress Notes (Signed)
08/19/2014 [name] [birth year] [id number]   HPI:  [name] [name] is a 79 y.o. male patient of Dr Percival Spanish, with a PMH below who presents today for a blood pressure check.  His cardiovascular history includes a cath in 2005, showing only mild non-obstructive coronary disease and an ECHO in 2014 showing well preserved EF.  He was in for an annual check with Dr. Percival Spanish and found to have a BP of 190/100.  I saw him shortly after that and found his pressure to be 158/78.  He brought in some home readings about 6 weeks ago, they were almost all <150/90, ad today he brings in his home cuff, with the memory showing only 1 reading >150/90, the other 17 were lower, averaging close to 140/80.  His current medications for blood pressure include clonidine 0.1mg  twice daily,  Diltiazem 420mg  once daily in the evening and metoprolol succinate 50mg  once daily in the mornings.  He notes that occasionally when his pressure is close to 654 systolic, he will take 2 clonidine tablets at the same time, but then he feels groggy/drowsy for much of the remainder of the day.   He apparently has not been tried on an ACEI or ARB.  His SCr is calculated at 52.  [name] [name]'s family history includes heart disease and hypertension in his mother as well as heart disease in 1 sister.  He has 3 sons and 1 daughter, of which one son and the daughter both have hypertension.   He states his father, who died of pneumonia, actually had more problems with hypotension.    He quit smoking in 1994 and stopped alcohol the following year.  He does not drink caffeine regularly and has been cutting back on his salt intake in the past year.  He is quite active, using both a stepper and stationary bike daily, as well as doing sit-ups, spending 45-60 minutes daily, sometimes twice daily.    Current Outpatient Prescriptions  Medication Sig Dispense Refill  . aspirin 81 MG tablet Take 81 mg by mouth. Take one tab twice a week     . cloNIDine  (CATAPRES) 0.1 MG tablet TAKE ONE TABLET BY MOUTH TWICE DAILY 60 tablet 5  . CRESTOR 20 MG tablet TAKE ONE TABLET BY MOUTH AT BEDTIME 30 tablet 5  . dorzolamide-timolol (COSOPT) 22.3-6.8 MG/ML ophthalmic solution 1 drop 2 (two) times daily.      Marland Kitchen MATZIM LA 420 MG 24 hr tablet TAKE ONE TABLET BY MOUTH ONCE DAILY 30 tablet 5  . metoprolol succinate (TOPROL-XL) 50 MG 24 hr tablet TAKE ONE TABLET BY MOUTH ONCE DAILY. TAKE WITH OR IMMEDIATELY FOLLOWING A MEAL 30 tablet 5  . omeprazole (PRILOSEC) 40 MG capsule Take 1 capsule (40 mg total) by mouth daily. 30 capsule 11  . sildenafil (VIAGRA) 100 MG tablet Take 0.5-1 tablets (50-100 mg total) by mouth daily as needed for erectile dysfunction. 3 tablet 0  . Travoprost, BAK Free, (TRAVATAN) 0.004 % SOLN ophthalmic solution Place 2 drops into both eyes at bedtime.     No current facility-administered medications for this visit.    No Known Allergies  Past Medical History  Diagnosis Date  . CAD in native artery     Nonobstructive 2005  . Hypertension   . Hyperlipidemia   . Prostatic hypertrophy, benign   . Glaucoma   . PSA elevation   . Abnormal EKG   . Renal insufficiency   . Routine general medical examination  at a health care facility   . Hearing impairment   . Elevated PSA     Blood pressure 140/70, pulse 44, height 5\' 8"  (1.727 m), weight 175 lb 11.2 oz (79.697 kg).   ASSESSMENT AND PLAN:  Today his blood pressure appears well controlled.  His home readings are mostly fine and in checking his home cuff, a [employer] (made by [employer]), his cuff showed to be accurate within 10 points (his cuff read higher).  I am concerned about his bradycardia, and will reduce his metoprolol to 25mg  daily.  I don't believe this will have an appreciable effect on his blood pressure, but I did ask that he continue with home checks 4-5 times per week.  If he notices that most of the readings are >150/90, he is to call the office for follow up.  Otherwise he will  continue to follow up yearly with Dr. Percival Spanish.    [name] [name] PharmD CPP [facility]

## 2014-10-01 ENCOUNTER — Ambulatory Visit (INDEPENDENT_AMBULATORY_CARE_PROVIDER_SITE_OTHER): Payer: Federal, State, Local not specified - PPO | Admitting: Family Medicine

## 2014-10-01 ENCOUNTER — Encounter: Payer: Self-pay | Admitting: Family Medicine

## 2014-10-01 VITALS — BP 132/74 | HR 47 | Temp 97.8°F | Wt 173.2 lb

## 2014-10-01 DIAGNOSIS — E785 Hyperlipidemia, unspecified: Secondary | ICD-10-CM

## 2014-10-01 DIAGNOSIS — I10 Essential (primary) hypertension: Secondary | ICD-10-CM

## 2014-10-01 LAB — BASIC METABOLIC PANEL WITH GFR
BUN: 20 mg/dL (ref 6–23)
CO2: 26 meq/L (ref 19–32)
Calcium: 9.3 mg/dL (ref 8.4–10.5)
Chloride: 108 meq/L (ref 96–112)
Creatinine, Ser: 1.46 mg/dL (ref 0.40–1.50)
GFR: 59.64 mL/min — ABNORMAL LOW
Glucose, Bld: 105 mg/dL — ABNORMAL HIGH (ref 70–99)
Potassium: 3.9 meq/L (ref 3.5–5.1)
Sodium: 139 meq/L (ref 135–145)

## 2014-10-01 LAB — HEPATIC FUNCTION PANEL
ALK PHOS: 56 U/L (ref 39–117)
ALT: 15 U/L (ref 0–53)
AST: 20 U/L (ref 0–37)
Albumin: 3.9 g/dL (ref 3.5–5.2)
Bilirubin, Direct: 0.2 mg/dL (ref 0.0–0.3)
TOTAL PROTEIN: 7.4 g/dL (ref 6.0–8.3)
Total Bilirubin: 1.1 mg/dL (ref 0.2–1.2)

## 2014-10-01 LAB — LIPID PANEL
CHOL/HDL RATIO: 3
Cholesterol: 111 mg/dL (ref 0–200)
HDL: 34 mg/dL — AB (ref >39.00)
LDL Cholesterol: 52 mg/dL (ref 0–99)
NONHDL: 77
Triglycerides: 127 mg/dL (ref 0.0–149.0)
VLDL: 25.4 mg/dL (ref 0.0–40.0)

## 2014-10-01 LAB — MICROALBUMIN / CREATININE URINE RATIO
CREATININE, U: 427.7 mg/dL
MICROALB/CREAT RATIO: 2.2 mg/g (ref 0.0–30.0)
Microalb, Ur: 9.4 mg/dL — ABNORMAL HIGH (ref 0.0–1.9)

## 2014-10-01 NOTE — Progress Notes (Signed)
  Subjective:    Patient here for follow-up of elevated blood pressure.  He is exercising and is adherent to a low-salt diet.  Blood pressure is well controlled at home. Cardiac symptoms: none. Patient denies: chest pain, chest pressure/discomfort, claudication, dyspnea, exertional chest pressure/discomfort, fatigue, irregular heart beat, lower extremity edema, near-syncope, orthopnea, palpitations, paroxysmal nocturnal dyspnea, syncope and tachypnea. Cardiovascular risk factors: advanced age (older than 41 for men, 31 for women), dyslipidemia, hypertension and male gender. Use of agents associated with hypertension: none. History of target organ damage: none.  The following portions of the patient's history were reviewed and updated as appropriate: allergies, current medications, past family history, past medical history, past social history, past surgical history and problem list.  Review of Systems Pertinent items are noted in HPI.     Objective:    BP 132/74 mmHg  Pulse 47  Temp(Src) 97.8 F (36.6 C) (Oral)  Wt 173 lb 3.2 oz (78.563 kg)  SpO2 100% General appearance: alert, cooperative, appears stated age and no distress Neck: no adenopathy, no carotid bruit, no JVD, supple, symmetrical, trachea midline and thyroid not enlarged, symmetric, no tenderness/mass/nodules Lungs: clear to auscultation bilaterally Heart: S1, S2 normal Extremities: extremities normal, atraumatic, no cyanosis or edema    Assessment:    Hypertension, normal blood pressure . Evidence of target organ damage: none.    Plan:    Medication: no change. Dietary sodium restriction. Regular aerobic exercise. Follow up: 6 months and as needed.    1. Essential hypertension Stable, check labs,  con't metoprolol, and clonidine - Basic metabolic panel - Hepatic function panel  2. Hyperlipidemia Check labs,  con't crestor - Lipid panel - Microalbumin / creatinine urine ratio

## 2014-10-01 NOTE — Progress Notes (Signed)
Pre visit review using our clinic review tool, if applicable. No additional management support is needed unless otherwise documented below in the visit note. 

## 2014-10-01 NOTE — Patient Instructions (Signed)

## 2014-10-11 DIAGNOSIS — N289 Disorder of kidney and ureter, unspecified: Secondary | ICD-10-CM

## 2014-10-16 ENCOUNTER — Other Ambulatory Visit: Payer: Self-pay | Admitting: Family Medicine

## 2014-10-16 MED ORDER — CLONIDINE HCL 0.1 MG PO TABS: 0.1000 mg | ORAL_TABLET | Freq: Two times a day (BID) | ORAL | Status: DC

## 2014-10-16 NOTE — Telephone Encounter (Signed)
Rx sent to [facility].  eal

## 2014-10-16 NOTE — Telephone Encounter (Signed)
Caller name:Fillinger Andris Relation to NG:EXBM Call back number:[phone] Pharmacy:[facility]   Reason for call: pt is needing rx cloNIDine (CATAPRES) 0.1 MG tablet

## 2014-10-22 ENCOUNTER — Telehealth: Payer: Self-pay | Admitting: Family Medicine

## 2014-10-22 MED ORDER — ROSUVASTATIN CALCIUM 20 MG PO TABS: 20.0000 mg | ORAL_TABLET | Freq: Every day | ORAL | Status: DC

## 2014-10-22 NOTE — Telephone Encounter (Signed)
Caller name:Shlome Relation to pt: self Call back number: [phone] Pharmacy: [facility] on [address]  Reason for call:   Requesting crestor refill

## 2014-10-22 NOTE — Telephone Encounter (Signed)
Rx has been faxed.    KP 

## 2014-12-03 ENCOUNTER — Other Ambulatory Visit: Payer: Self-pay

## 2014-12-03 MED ORDER — DILTIAZEM HCL ER COATED BEADS 420 MG PO TB24: 420.0000 mg | ORAL_TABLET | Freq: Every day | ORAL | Status: DC

## 2015-03-18 ENCOUNTER — Telehealth: Payer: Self-pay | Admitting: Family Medicine

## 2015-03-18 NOTE — Telephone Encounter (Signed)
pre visit letter mailed 03/11/15

## 2015-03-31 ENCOUNTER — Telehealth: Payer: Self-pay | Admitting: Behavioral Health

## 2015-03-31 NOTE — Telephone Encounter (Signed)
Error

## 2015-03-31 NOTE — Telephone Encounter (Signed)
Last note was an error:  Pre-visit info was not completed. Unable to reach patient at time of Pre-Visit Call.  Left message for patient to return call when available.

## 2015-03-31 NOTE — Telephone Encounter (Signed)
Pre-Visit Call completed with patient and chart updated.   Pre-Visit Info documented in Specialty Comments under [employer].    

## 2015-04-01 ENCOUNTER — Encounter: Payer: Self-pay | Admitting: Family Medicine

## 2015-04-01 ENCOUNTER — Ambulatory Visit (INDEPENDENT_AMBULATORY_CARE_PROVIDER_SITE_OTHER): Payer: Federal, State, Local not specified - PPO | Admitting: Family Medicine

## 2015-04-01 ENCOUNTER — Other Ambulatory Visit: Payer: Self-pay | Admitting: Family Medicine

## 2015-04-01 VITALS — BP 118/74 | HR 48 | Temp 97.7°F | Ht 68.0 in | Wt 177.6 lb

## 2015-04-01 DIAGNOSIS — I10 Essential (primary) hypertension: Secondary | ICD-10-CM | POA: Diagnosis not present

## 2015-04-01 DIAGNOSIS — N401 Enlarged prostate with lower urinary tract symptoms: Secondary | ICD-10-CM | POA: Diagnosis not present

## 2015-04-01 DIAGNOSIS — R972 Elevated prostate specific antigen [PSA]: Secondary | ICD-10-CM

## 2015-04-01 DIAGNOSIS — R8290 Unspecified abnormal findings in urine: Secondary | ICD-10-CM

## 2015-04-01 DIAGNOSIS — R809 Proteinuria, unspecified: Secondary | ICD-10-CM

## 2015-04-01 DIAGNOSIS — E785 Hyperlipidemia, unspecified: Secondary | ICD-10-CM | POA: Diagnosis not present

## 2015-04-01 DIAGNOSIS — Z0000 Encounter for general adult medical examination without abnormal findings: Secondary | ICD-10-CM | POA: Diagnosis not present

## 2015-04-01 DIAGNOSIS — N138 Other obstructive and reflux uropathy: Secondary | ICD-10-CM

## 2015-04-01 LAB — CBC WITH DIFFERENTIAL/PLATELET
Basophils Absolute: 0 10*3/uL (ref 0.0–0.1)
Basophils Relative: 0.4 % (ref 0.0–3.0)
EOS ABS: 0.2 10*3/uL (ref 0.0–0.7)
Eosinophils Relative: 2 % (ref 0.0–5.0)
HCT: 39.7 % (ref 39.0–52.0)
HEMOGLOBIN: 13.4 g/dL (ref 13.0–17.0)
Lymphocytes Relative: 20.5 % (ref 12.0–46.0)
Lymphs Abs: 1.6 10*3/uL (ref 0.7–4.0)
MCHC: 33.7 g/dL (ref 30.0–36.0)
MCV: 92.2 fl (ref 78.0–100.0)
Monocytes Absolute: 0.7 10*3/uL (ref 0.1–1.0)
Monocytes Relative: 8.3 % (ref 3.0–12.0)
Neutro Abs: 5.5 10*3/uL (ref 1.4–7.7)
Neutrophils Relative %: 68.8 % (ref 43.0–77.0)
PLATELETS: 220 10*3/uL (ref 150.0–400.0)
RBC: 4.3 Mil/uL (ref 4.22–5.81)
RDW: 13.6 % (ref 11.5–15.5)
WBC: 8 10*3/uL (ref 4.0–10.5)

## 2015-04-01 LAB — POCT URINALYSIS DIPSTICK
Blood, UA: NEGATIVE
Glucose, UA: NEGATIVE
Leukocytes, UA: NEGATIVE
Nitrite, UA: NEGATIVE
Spec Grav, UA: >=1.03
Urobilinogen, UA: 0.2
pH, UA: 5.5

## 2015-04-01 LAB — LIPID PANEL
CHOL/HDL RATIO: 5
Cholesterol: 187 mg/dL (ref 0–200)
HDL: 39.5 mg/dL (ref >39.00)
LDL CALC: 119 mg/dL — AB (ref 0–99)
NonHDL: 147.5
TRIGLYCERIDES: 145 mg/dL (ref 0.0–149.0)
VLDL: 29 mg/dL (ref 0.0–40.0)

## 2015-04-01 LAB — HEPATIC FUNCTION PANEL
ALT: 10 U/L (ref 0–53)
AST: 16 U/L (ref 0–37)
Albumin: 3.8 g/dL (ref 3.5–5.2)
Alkaline Phosphatase: 58 U/L (ref 39–117)
BILIRUBIN DIRECT: 0.1 mg/dL (ref 0.0–0.3)
BILIRUBIN TOTAL: 0.8 mg/dL (ref 0.2–1.2)
TOTAL PROTEIN: 7 g/dL (ref 6.0–8.3)

## 2015-04-01 LAB — BASIC METABOLIC PANEL
BUN: 19 mg/dL (ref 6–23)
CALCIUM: 8.8 mg/dL (ref 8.4–10.5)
CO2: 24 meq/L (ref 19–32)
Chloride: 107 mEq/L (ref 96–112)
Creatinine, Ser: 1.42 mg/dL (ref 0.40–1.50)
GFR: 61.5 mL/min (ref >60.00)
GLUCOSE: 106 mg/dL — AB (ref 70–99)
Potassium: 4.1 mEq/L (ref 3.5–5.1)
SODIUM: 139 meq/L (ref 135–145)

## 2015-04-01 LAB — MICROALBUMIN / CREATININE URINE RATIO
Creatinine,U: 639.3 mg/dL
MICROALB UR: 6.8 mg/dL — AB (ref 0.0–1.9)
Microalb Creat Ratio: 1.1 mg/g (ref 0.0–30.0)

## 2015-04-01 LAB — PSA: PSA: 25.86 ng/mL — ABNORMAL HIGH (ref 0.10–4.00)

## 2015-04-01 NOTE — Assessment & Plan Note (Signed)
con't diltiazem, clonidine and metoprolol stable

## 2015-04-01 NOTE — Progress Notes (Signed)
Patient ID: [name] [name], male    DOB: [birth year]  Age: 81 y.o. MRN: [id number]    Subjective:  Subjective HPI [name] [name] presents for cpe and f/u bp and cholesterol   No complaints.  Pt needs no refills at this time.  Review of Systems  Constitutional: Negative.   HENT: Negative for congestion, ear pain, hearing loss, nosebleeds, postnasal drip, rhinorrhea, sinus pressure, sneezing and tinnitus.   Eyes: Negative for photophobia, discharge, itching and visual disturbance.  Respiratory: Negative.   Cardiovascular: Negative.   Gastrointestinal: Negative for abdominal pain, constipation, blood in stool, abdominal distention and anal bleeding.  Endocrine: Negative.   Genitourinary: Negative.   Musculoskeletal: Negative.   Skin: Negative.   Allergic/Immunologic: Negative.   Neurological: Negative for dizziness, weakness, light-headedness, numbness and headaches.  Psychiatric/Behavioral: Negative for suicidal ideas, confusion, sleep disturbance, dysphoric mood, decreased concentration and agitation. The patient is not nervous/anxious.     History Past Medical History  Diagnosis Date  . CAD in native artery     Nonobstructive 2005  . Hypertension   . Hyperlipidemia   . Prostatic hypertrophy, benign   . Glaucoma   . PSA elevation   . Abnormal EKG   . Renal insufficiency   . Routine general medical examination at a health care facility   . Hearing impairment   . Elevated PSA     He has past surgical history that includes Transurethral resection of prostate; Cholecystectomy (03/2010); Colonoscopy; Tonsilectomy, adenoidectomy, bilateral myringotomy and tubes; removal of cyst; Shoulder surgery; Bladder surgery; and Cataract extraction.   His family history includes Coronary artery disease in his mother; Diabetes in his sister; Heart disease in his mother; Heart disease (age of onset: 21) in his sister; Hypertension in his daughter, mother, and son.He reports that he quit smoking  about 22 years ago. His smoking use included Cigarettes. He has a 6 pack-year smoking history. He has never used smokeless tobacco. He reports that he does not drink alcohol or use illicit drugs.  Current Outpatient Prescriptions on File Prior to Visit  Medication Sig Dispense Refill  . aspirin 81 MG tablet Take 81 mg by mouth. Take one tab twice a week     . brimonidine (ALPHAGAN) 0.2 % ophthalmic solution Place 1 drop into both eyes 2 (two) times daily.    . cloNIDine (CATAPRES) 0.1 MG tablet Take 1 tablet (0.1 mg total) by mouth 2 (two) times daily. 60 tablet 5  . diltiazem (MATZIM LA) 420 MG 24 hr tablet Take 1 tablet (420 mg total) by mouth daily. 30 tablet 5  . dorzolamide-timolol (COSOPT) 22.3-6.8 MG/ML ophthalmic solution 1 drop 2 (two) times daily.      . metoprolol succinate (TOPROL-XL) 25 MG 24 hr tablet Take 1 tablet (25 mg total) by mouth daily. 30 tablet 6  . omeprazole (PRILOSEC) 40 MG capsule Take 1 capsule (40 mg total) by mouth daily. 30 capsule 11  . rosuvastatin (CRESTOR) 20 MG tablet Take 1 tablet (20 mg total) by mouth at bedtime. 30 tablet 5  . sildenafil (VIAGRA) 100 MG tablet Take 0.5-1 tablets (50-100 mg total) by mouth daily as needed for erectile dysfunction. 3 tablet 0   No current facility-administered medications on file prior to visit.     Objective:  Objective Physical Exam  Constitutional: He is oriented to person, place, and time. He appears well-developed and well-nourished. No distress.  HENT:  Head: Normocephalic and atraumatic.  Right Ear: External ear normal.  Left Ear: External  ear normal.  Nose: Nose normal.  Mouth/Throat: Oropharynx is clear and moist. No oropharyngeal exudate.  Eyes: Conjunctivae and EOM are normal. Pupils are equal, round, and reactive to light. Right eye exhibits no discharge. Left eye exhibits no discharge.  Neck: Normal range of motion. Neck supple. No JVD present. No thyromegaly present.  Cardiovascular: Normal rate,  regular rhythm and intact distal pulses.  Exam reveals no gallop and no friction rub.   No murmur heard. Pulmonary/Chest: Effort normal and breath sounds normal. No respiratory distress. He has no wheezes. He has no rales. He exhibits no tenderness.  Abdominal: Soft. Bowel sounds are normal. He exhibits no distension and no mass. There is no tenderness. There is no rebound and no guarding.  Genitourinary: Rectum normal and penis normal. Guaiac negative stool. Prostate is enlarged. Prostate is not tender.  Prostate smooth, no nodules  Musculoskeletal: Normal range of motion. He exhibits no edema or tenderness.  Lymphadenopathy:    He has no cervical adenopathy.  Neurological: He is alert and oriented to person, place, and time. He displays normal reflexes. He exhibits normal muscle tone.  Skin: Skin is warm and dry. No rash noted. He is not diaphoretic. No erythema. No pallor.  Psychiatric: He has a normal mood and affect. His behavior is normal. Judgment and thought content normal.   BP 118/74 mmHg  Pulse 48  Temp(Src) 97.7 F (36.5 C) (Oral)  Ht 5\' 8"  (1.727 m)  Wt 177 lb 9.6 oz (80.559 kg)  BMI 27.01 kg/m2  SpO2 99% Wt Readings from Last 3 Encounters:  04/01/15 177 lb 9.6 oz (80.559 kg)  10/01/14 173 lb 3.2 oz (78.563 kg)  08/19/14 175 lb 11.2 oz (79.697 kg)     Lab Results  Component Value Date   WBC 7.6 04/02/2013   HGB 13.0 04/02/2013   HCT 39.1 04/02/2013   PLT 203.0 04/02/2013   GLUCOSE 105* 10/01/2014   CHOL 111 10/01/2014   TRIG 127.0 10/01/2014   HDL 34.00* 10/01/2014   LDLDIRECT 170.2 10/09/2007   LDLCALC 52 10/01/2014   ALT 15 10/01/2014   AST 20 10/01/2014   NA 139 10/01/2014   K 3.9 10/01/2014   CL 108 10/01/2014   CREATININE 1.46 10/01/2014   BUN 20 10/01/2014   CO2 26 10/01/2014   TSH 1.38 05/19/2010   PSA 16.51* 04/02/2013   HGBA1C 5.1 04/02/2013   MICROALBUR 9.4* 10/01/2014    Dg Cholangiogram Operative  06/30/2010   Clinical Data:    Gallstones.   INTRAOPERATIVE CHOLANGIOGRAM   Comparison: Ultrasound 05/22/2010   Findings: Intraoperative spot images show normal caliber biliary system.  No evidence of retained stone or obstruction.  Free passage of contrast into the small bowel noted.   IMPRESSION: No evidence of retained stone or obstruction.   These images were submitted for radiologic interpretation only. Please see the procedural report for the amount of contrast and the fluoroscopy time utilized.  Provider: Charlton Haws    Assessment & Plan:  Plan I have discontinued Mr. Sahni Travoprost (BAK Free). I am also having him maintain his dorzolamide-timolol, aspirin, sildenafil, omeprazole, metoprolol succinate, brimonidine, cloNIDine, rosuvastatin, diltiazem, and latanoprost.  Meds ordered this encounter  Medications  . latanoprost (XALATAN) 0.005 % ophthalmic solution    Sig: Place 1 drop into both eyes at bedtime.    Problem List Items Addressed This Visit    Preventative health care    Check labs ghm utd See AVS      Hyperlipidemia  con't crestor Check labs today      Relevant Orders   Hepatic function panel   Lipid panel   Essential hypertension    con't diltiazem, clonidine and metoprolol stable      Relevant Orders   Basic metabolic panel   CBC with Differential/Platelet   Microalbumin / creatinine urine ratio   POCT urinalysis dipstick    Other Visit Diagnoses    BPH (benign prostatic hypertrophy) with urinary obstruction    -  Primary    Relevant Orders    PSA       Follow-up: No Follow-up on file.  Garnet Koyanagi, DO

## 2015-04-01 NOTE — Addendum Note (Signed)
Addended by: Peggyann Shoals on: 04/01/2015 11:30 AM   Modules accepted: Orders

## 2015-04-01 NOTE — Assessment & Plan Note (Signed)
Check labs ghm utd See AVS 

## 2015-04-01 NOTE — Patient Instructions (Signed)
Preventive Care for Adults A healthy lifestyle and preventive care can promote health and wellness. Preventive health guidelines for men include the following key practices:  A routine yearly physical is a good way to check with your health care provider about your health and preventative screening. It is a chance to share any concerns and updates on your health and to receive a thorough exam.  Visit your dentist for a routine exam and preventative care every 6 months. Brush your teeth twice a day and floss once a day. Good oral hygiene prevents tooth decay and gum disease.  The frequency of eye exams is based on your age, health, family medical history, use of contact lenses, and other factors. Follow your health care provider's recommendations for frequency of eye exams.  Eat a healthy diet. Foods such as vegetables, fruits, whole grains, low-fat dairy products, and lean protein foods contain the nutrients you need without too many calories. Decrease your intake of foods high in solid fats, added sugars, and salt. Eat the right amount of calories for you.Get information about a proper diet from your health care provider, if necessary.  Regular physical exercise is one of the most important things you can do for your health. Most adults should get at least 150 minutes of moderate-intensity exercise (any activity that increases your heart rate and causes you to sweat) each week. In addition, most adults need muscle-strengthening exercises on 2 or more days a week.  Maintain a healthy weight. The body mass index (BMI) is a screening tool to identify possible weight problems. It provides an estimate of body fat based on height and weight. Your health care provider can find your BMI and can help you achieve or maintain a healthy weight.For adults 20 years and older:  A BMI below 18.5 is considered underweight.  A BMI of 18.5 to 24.9 is normal.  A BMI of 25 to 29.9 is considered overweight.  A BMI  of 30 and above is considered obese.  Maintain normal blood lipids and cholesterol levels by exercising and minimizing your intake of saturated fat. Eat a balanced diet with plenty of fruit and vegetables. Blood tests for lipids and cholesterol should begin at age 50 and be repeated every 5 years. If your lipid or cholesterol levels are high, you are over 50, or you are at high risk for heart disease, you may need your cholesterol levels checked more frequently.Ongoing high lipid and cholesterol levels should be treated with medicines if diet and exercise are not working.  If you smoke, find out from your health care provider how to quit. If you do not use tobacco, do not start.  Lung cancer screening is recommended for adults aged 73-80 years who are at high risk for developing lung cancer because of a history of smoking. A yearly low-dose CT scan of the lungs is recommended for people who have at least a 30-pack-year history of smoking and are a current smoker or have quit within the past 15 years. A pack year of smoking is smoking an average of 1 pack of cigarettes a day for 1 year (for example: 1 pack a day for 30 years or 2 packs a day for 15 years). Yearly screening should continue until the smoker has stopped smoking for at least 15 years. Yearly screening should be stopped for people who develop a health problem that would prevent them from having lung cancer treatment.  If you choose to drink alcohol, do not have more than  2 drinks per day. One drink is considered to be 12 ounces (355 mL) of beer, 5 ounces (148 mL) of wine, or 1.5 ounces (44 mL) of liquor.  Avoid use of street drugs. Do not share needles with anyone. Ask for help if you need support or instructions about stopping the use of drugs.  High blood pressure causes heart disease and increases the risk of stroke. Your blood pressure should be checked at least every 1-2 years. Ongoing high blood pressure should be treated with  medicines, if weight loss and exercise are not effective.  If you are 45-79 years old, ask your health care provider if you should take aspirin to prevent heart disease.  Diabetes screening involves taking a blood sample to check your fasting blood sugar level. This should be done once every 3 years, after age 45, if you are within normal weight and without risk factors for diabetes. Testing should be considered at a younger age or be carried out more frequently if you are overweight and have at least 1 risk factor for diabetes.  Colorectal cancer can be detected and often prevented. Most routine colorectal cancer screening begins at the age of 50 and continues through age 75. However, your health care provider may recommend screening at an earlier age if you have risk factors for colon cancer. On a yearly basis, your health care provider may provide home test kits to check for hidden blood in the stool. Use of a small camera at the end of a tube to directly examine the colon (sigmoidoscopy or colonoscopy) can detect the earliest forms of colorectal cancer. Talk to your health care provider about this at age 50, when routine screening begins. Direct exam of the colon should be repeated every 5-10 years through age 75, unless early forms of precancerous polyps or small growths are found.  People who are at an increased risk for hepatitis B should be screened for this virus. You are considered at high risk for hepatitis B if:  You were born in a country where hepatitis B occurs often. Talk with your health care provider about which countries are considered high risk.  Your parents were born in a high-risk country and you have not received a shot to protect against hepatitis B (hepatitis B vaccine).  You have HIV or AIDS.  You use needles to inject street drugs.  You live with, or have sex with, someone who has hepatitis B.  You are a man who has sex with other men (MSM).  You get hemodialysis  treatment.  You take certain medicines for conditions such as cancer, organ transplantation, and autoimmune conditions.  Hepatitis C blood testing is recommended for all people born from 1945 through 1965 and any individual with known risks for hepatitis C.  Practice safe sex. Use condoms and avoid high-risk sexual practices to reduce the spread of sexually transmitted infections (STIs). STIs include gonorrhea, chlamydia, syphilis, trichomonas, herpes, HPV, and human immunodeficiency virus (HIV). Herpes, HIV, and HPV are viral illnesses that have no cure. They can result in disability, cancer, and death.  If you are at risk of being infected with HIV, it is recommended that you take a prescription medicine daily to prevent HIV infection. This is called preexposure prophylaxis (PrEP). You are considered at risk if:  You are a man who has sex with other men (MSM) and have other risk factors.  You are a heterosexual man, are sexually active, and are at increased risk for HIV infection.    You take drugs by injection.  You are sexually active with a partner who has HIV.  Talk with your health care provider about whether you are at high risk of being infected with HIV. If you choose to begin PrEP, you should first be tested for HIV. You should then be tested every 3 months for as long as you are taking PrEP.  A one-time screening for abdominal aortic aneurysm (AAA) and surgical repair of large AAAs by ultrasound are recommended for men ages 32 to 67 years who are current or former smokers.  Healthy men should no longer receive prostate-specific antigen (PSA) blood tests as part of routine cancer screening. Talk with your health care provider about prostate cancer screening.  Testicular cancer screening is not recommended for adult males who have no symptoms. Screening includes self-exam, a health care provider exam, and other screening tests. Consult with your health care provider about any symptoms  you have or any concerns you have about testicular cancer.  Use sunscreen. Apply sunscreen liberally and repeatedly throughout the day. You should seek shade when your shadow is shorter than you. Protect yourself by wearing long sleeves, pants, a wide-brimmed hat, and sunglasses year round, whenever you are outdoors.  Once a month, do a whole-body skin exam, using a mirror to look at the skin on your back. Tell your health care provider about new moles, moles that have irregular borders, moles that are larger than a pencil eraser, or moles that have changed in shape or color.  Stay current with required vaccines (immunizations).  Influenza vaccine. All adults should be immunized every year.  Tetanus, diphtheria, and acellular pertussis (Td, Tdap) vaccine. An adult who has not previously received Tdap or who does not know his vaccine status should receive 1 dose of Tdap. This initial dose should be followed by tetanus and diphtheria toxoids (Td) booster doses every 10 years. Adults with an unknown or incomplete history of completing a 3-dose immunization series with Td-containing vaccines should begin or complete a primary immunization series including a Tdap dose. Adults should receive a Td booster every 10 years.  Varicella vaccine. An adult without evidence of immunity to varicella should receive 2 doses or a second dose if he has previously received 1 dose.  Human papillomavirus (HPV) vaccine. Males aged 68-21 years who have not received the vaccine previously should receive the 3-dose series. Males aged 22-26 years may be immunized. Immunization is recommended through the age of 6 years for any male who has sex with males and did not get any or all doses earlier. Immunization is recommended for any person with an immunocompromised condition through the age of 49 years if he did not get any or all doses earlier. During the 3-dose series, the second dose should be obtained 4-8 weeks after the first  dose. The third dose should be obtained 24 weeks after the first dose and 16 weeks after the second dose.  Zoster vaccine. One dose is recommended for adults aged 50 years or older unless certain conditions are present.  Measles, mumps, and rubella (MMR) vaccine. Adults born before [birth year] generally are considered immune to measles and mumps. Adults born in [birth year] or later should have 1 or more doses of MMR vaccine unless there is a contraindication to the vaccine or there is laboratory evidence of immunity to each of the three diseases. A routine second dose of MMR vaccine should be obtained at least 28 days after the first dose for students attending postsecondary  schools, health care workers, or international travelers. People who received inactivated measles vaccine or an unknown type of measles vaccine during 1963-1967 should receive 2 doses of MMR vaccine. People who received inactivated mumps vaccine or an unknown type of mumps vaccine before 1979 and are at high risk for mumps infection should consider immunization with 2 doses of MMR vaccine. Unvaccinated health care workers born before [birth year] who lack laboratory evidence of measles, mumps, or rubella immunity or laboratory confirmation of disease should consider measles and mumps immunization with 2 doses of MMR vaccine or rubella immunization with 1 dose of MMR vaccine.  Pneumococcal 13-valent conjugate (PCV13) vaccine. When indicated, a person who is uncertain of his immunization history and has no record of immunization should receive the PCV13 vaccine. An adult aged 71 years or older who has certain medical conditions and has not been previously immunized should receive 1 dose of PCV13 vaccine. This PCV13 should be followed with a dose of pneumococcal polysaccharide (PPSV23) vaccine. The PPSV23 vaccine dose should be obtained at least 8 weeks after the dose of PCV13 vaccine. An adult aged 74 years or older who has certain medical conditions and  previously received 1 or more doses of PPSV23 vaccine should receive 1 dose of PCV13. The PCV13 vaccine dose should be obtained 1 or more years after the last PPSV23 vaccine dose.  Pneumococcal polysaccharide (PPSV23) vaccine. When PCV13 is also indicated, PCV13 should be obtained first. All adults aged 16 years and older should be immunized. An adult younger than age 56 years who has certain medical conditions should be immunized. Any person who resides in a nursing home or long-term care facility should be immunized. An adult smoker should be immunized. People with an immunocompromised condition and certain other conditions should receive both PCV13 and PPSV23 vaccines. People with human immunodeficiency virus (HIV) infection should be immunized as soon as possible after diagnosis. Immunization during chemotherapy or radiation therapy should be avoided. Routine use of PPSV23 vaccine is not recommended for American Indians, [place] Natives, or people younger than 65 years unless there are medical conditions that require PPSV23 vaccine. When indicated, people who have unknown immunization and have no record of immunization should receive PPSV23 vaccine. One-time revaccination 5 years after the first dose of PPSV23 is recommended for people aged 19-64 years who have chronic kidney failure, nephrotic syndrome, asplenia, or immunocompromised conditions. People who received 1-2 doses of PPSV23 before age 21 years should receive another dose of PPSV23 vaccine at age 20 years or later if at least 5 years have passed since the previous dose. Doses of PPSV23 are not needed for people immunized with PPSV23 at or after age 52 years.  Meningococcal vaccine. Adults with asplenia or persistent complement component deficiencies should receive 2 doses of quadrivalent meningococcal conjugate (MenACWY-D) vaccine. The doses should be obtained at least 2 months apart. Microbiologists working with certain meningococcal bacteria,  [place] recruits, people at risk during an outbreak, and people who travel to or live in countries with a high rate of meningitis should be immunized. A first-year college student up through age 45 years who is living in a residence hall should receive a dose if he did not receive a dose on or after his 16th birthday. Adults who have certain high-risk conditions should receive one or more doses of vaccine.  Hepatitis A vaccine. Adults who wish to be protected from this disease, have certain high-risk conditions, work with hepatitis A-infected animals, work in hepatitis A research labs, or  travel to or work in countries with a high rate of hepatitis A should be immunized. Adults who were previously unvaccinated and who anticipate close contact with an international adoptee during the first 60 days after arrival in the [place] from a country with a high rate of hepatitis A should be immunized.  Hepatitis B vaccine. Adults should be immunized if they wish to be protected from this disease, have certain high-risk conditions, may be exposed to blood or other infectious body fluids, are household contacts or sex partners of hepatitis B positive people, are clients or workers in certain care facilities, or travel to or work in countries with a high rate of hepatitis B.  Haemophilus influenzae type b (Hib) vaccine. A previously unvaccinated person with asplenia or sickle cell disease or having a scheduled splenectomy should receive 1 dose of Hib vaccine. Regardless of previous immunization, a recipient of a hematopoietic stem cell transplant should receive a 3-dose series 6-12 months after his successful transplant. Hib vaccine is not recommended for adults with HIV infection. Preventive Service / Frequency Ages 52 to 17  Blood pressure check.** / Every 1 to 2 years.  Lipid and cholesterol check.** / Every 5 years beginning at age 69.  Hepatitis C blood test.** / For any individual with known risks for  hepatitis C.  Skin self-exam. / Monthly.  Influenza vaccine. / Every year.  Tetanus, diphtheria, and acellular pertussis (Tdap, Td) vaccine.** / Consult your health care provider. 1 dose of Td every 10 years.  Varicella vaccine.** / Consult your health care provider.  HPV vaccine. / 3 doses over 6 months, if 72 or younger.  Measles, mumps, rubella (MMR) vaccine.** / You need at least 1 dose of MMR if you were born in 1957 or later. You may also need a second dose.  Pneumococcal 13-valent conjugate (PCV13) vaccine.** / Consult your health care provider.  Pneumococcal polysaccharide (PPSV23) vaccine.** / 1 to 2 doses if you smoke cigarettes or if you have certain conditions.  Meningococcal vaccine.** / 1 dose if you are age 35 to 60 years and a Market researcher living in a residence hall, or have one of several medical conditions. You may also need additional booster doses.  Hepatitis A vaccine.** / Consult your health care provider.  Hepatitis B vaccine.** / Consult your health care provider.  Haemophilus influenzae type b (Hib) vaccine.** / Consult your health care provider. Ages 35 to 8  Blood pressure check.** / Every 1 to 2 years.  Lipid and cholesterol check.** / Every 5 years beginning at age 57.  Lung cancer screening. / Every year if you are aged 44-80 years and have a 30-pack-year history of smoking and currently smoke or have quit within the past 15 years. Yearly screening is stopped once you have quit smoking for at least 15 years or develop a health problem that would prevent you from having lung cancer treatment.  Fecal occult blood test (FOBT) of stool. / Every year beginning at age 55 and continuing until age 73. You may not have to do this test if you get a colonoscopy every 10 years.  Flexible sigmoidoscopy** or colonoscopy.** / Every 5 years for a flexible sigmoidoscopy or every 10 years for a colonoscopy beginning at age 28 and continuing until age  1.  Hepatitis C blood test.** / For all people born from [birth year] through 1965 and any individual with known risks for hepatitis C.  Skin self-exam. / Monthly.  Influenza vaccine. / Every  year.  Tetanus, diphtheria, and acellular pertussis (Tdap/Td) vaccine.** / Consult your health care provider. 1 dose of Td every 10 years.  Varicella vaccine.** / Consult your health care provider.  Zoster vaccine.** / 1 dose for adults aged 53 years or older.  Measles, mumps, rubella (MMR) vaccine.** / You need at least 1 dose of MMR if you were born in 1957 or later. You may also need a second dose.  Pneumococcal 13-valent conjugate (PCV13) vaccine.** / Consult your health care provider.  Pneumococcal polysaccharide (PPSV23) vaccine.** / 1 to 2 doses if you smoke cigarettes or if you have certain conditions.  Meningococcal vaccine.** / Consult your health care provider.  Hepatitis A vaccine.** / Consult your health care provider.  Hepatitis B vaccine.** / Consult your health care provider.  Haemophilus influenzae type b (Hib) vaccine.** / Consult your health care provider. Ages 77 and over  Blood pressure check.** / Every 1 to 2 years.  Lipid and cholesterol check.**/ Every 5 years beginning at age 85.  Lung cancer screening. / Every year if you are aged 55-80 years and have a 30-pack-year history of smoking and currently smoke or have quit within the past 15 years. Yearly screening is stopped once you have quit smoking for at least 15 years or develop a health problem that would prevent you from having lung cancer treatment.  Fecal occult blood test (FOBT) of stool. / Every year beginning at age 33 and continuing until age 11. You may not have to do this test if you get a colonoscopy every 10 years.  Flexible sigmoidoscopy** or colonoscopy.** / Every 5 years for a flexible sigmoidoscopy or every 10 years for a colonoscopy beginning at age 28 and continuing until age 73.  Hepatitis C blood  test.** / For all people born from [birth year] through 1963 and any individual with known risks for hepatitis C.  Abdominal aortic aneurysm (AAA) screening.** / A one-time screening for ages 50 to 27 years who are current or former smokers.  Skin self-exam. / Monthly.  Influenza vaccine. / Every year.  Tetanus, diphtheria, and acellular pertussis (Tdap/Td) vaccine.** / 1 dose of Td every 10 years.  Varicella vaccine.** / Consult your health care provider.  Zoster vaccine.** / 1 dose for adults aged 34 years or older.  Pneumococcal 13-valent conjugate (PCV13) vaccine.** / Consult your health care provider.  Pneumococcal polysaccharide (PPSV23) vaccine.** / 1 dose for all adults aged 63 years and older.  Meningococcal vaccine.** / Consult your health care provider.  Hepatitis A vaccine.** / Consult your health care provider.  Hepatitis B vaccine.** / Consult your health care provider.  Haemophilus influenzae type b (Hib) vaccine.** / Consult your health care provider. **Family history and personal history of risk and conditions may change your health care provider's recommendations. Document Released: 10/26/2001 Document Revised: 09/04/2013 Document Reviewed: 01/25/2011 [facility] Patient Information 2015 [place], [place]. This information is not intended to replace advice given to you by your health care provider. Make sure you discuss any questions you have with your health care provider.

## 2015-04-01 NOTE — Assessment & Plan Note (Signed)
con't crestor Check labs today

## 2015-04-01 NOTE — Progress Notes (Signed)
Pre visit review using our clinic review tool, if applicable. No additional management support is needed unless otherwise documented below in the visit note. 

## 2015-04-02 ENCOUNTER — Other Ambulatory Visit: Payer: Self-pay

## 2015-04-02 DIAGNOSIS — R809 Proteinuria, unspecified: Secondary | ICD-10-CM

## 2015-04-03 ENCOUNTER — Encounter: Payer: Self-pay | Admitting: Family Medicine

## 2015-04-03 LAB — URINE CULTURE

## 2015-04-16 ENCOUNTER — Other Ambulatory Visit: Payer: Self-pay | Admitting: Family Medicine

## 2015-04-18 ENCOUNTER — Telehealth: Payer: Self-pay | Admitting: Family Medicine

## 2015-04-18 MED ORDER — CLONIDINE HCL 0.1 MG PO TABS: 0.1000 mg | ORAL_TABLET | Freq: Two times a day (BID) | ORAL | Status: DC

## 2015-04-18 NOTE — Telephone Encounter (Signed)
Relation to pt: self  Call back [facility]: [place], [place] - [place] [phone] (Phone) [phone] (Fax)         Reason for call:  Patient spoke with pharmacy 04/17/15 and pharmacy states they have not received cloNIDine (CATAPRES) 0.1 MG tablet

## 2015-04-18 NOTE — Telephone Encounter (Signed)
Rx faxed.    KP 

## 2015-05-13 ENCOUNTER — Other Ambulatory Visit: Payer: Self-pay | Admitting: Cardiology

## 2015-05-13 NOTE — Telephone Encounter (Signed)
REFILL 

## 2015-06-12 ENCOUNTER — Other Ambulatory Visit: Payer: Self-pay | Admitting: Family Medicine

## 2015-06-16 ENCOUNTER — Encounter: Payer: Self-pay | Admitting: Cardiology

## 2015-06-16 ENCOUNTER — Ambulatory Visit (INDEPENDENT_AMBULATORY_CARE_PROVIDER_SITE_OTHER): Payer: Federal, State, Local not specified - PPO | Admitting: Cardiology

## 2015-06-16 VITALS — BP 154/92 | HR 58 | Ht 69.0 in | Wt 173.2 lb

## 2015-06-16 DIAGNOSIS — R001 Bradycardia, unspecified: Secondary | ICD-10-CM | POA: Diagnosis not present

## 2015-06-16 NOTE — Progress Notes (Signed)
HPI The patient presents for follow up of a mildly reduced ejection fraction related to hypertension. He did have a cath 2005 with nonobstructive coronary disease.   I did an echo in 2014 and the EF was well preserved.  I have been following him for difficult to control hypertension. He's on multiple medications and his blood pressure slightly elevated today but usually well preserved otherwise. At home it is within reasonable limits. He denies any cardiovascular symptoms. He's quite active exercising routinely.  The patient denies any new symptoms such as chest discomfort, neck or arm discomfort. There has been no new shortness of breath, PND or orthopnea. There have been no reported palpitations, presyncope or syncope.  No Known Allergies  Current Outpatient Prescriptions  Medication Sig Dispense Refill  . aspirin 81 MG tablet Take 81 mg by mouth. Take one tab twice a week     . cloNIDine (CATAPRES) 0.1 MG tablet Take 1 tablet (0.1 mg total) by mouth 2 (two) times daily. 60 tablet 5  . dorzolamide-timolol (COSOPT) 22.3-6.8 MG/ML ophthalmic solution 1 drop 2 (two) times daily.      Marland Kitchen latanoprost (XALATAN) 0.005 % ophthalmic solution Place 1 drop into both eyes at bedtime.    Marland Kitchen MATZIM LA 420 MG 24 hr tablet TAKE ONE TABLET BY MOUTH ONCE DAILY 30 tablet 5  . metoprolol succinate (TOPROL-XL) 25 MG 24 hr tablet TAKE ONE TABLET BY MOUTH ONCE DAILY 30 tablet 1  . pilocarpine (PILOCAR) 1 % ophthalmic solution Place 1 drop into the left eye 3 (three) times daily.    . rosuvastatin (CRESTOR) 20 MG tablet Take 1 tablet (20 mg total) by mouth at bedtime. 30 tablet 5  . sildenafil (VIAGRA) 100 MG tablet Take 0.5-1 tablets (50-100 mg total) by mouth daily as needed for erectile dysfunction. 3 tablet 0  . omeprazole (PRILOSEC) 40 MG capsule Take 1 capsule (40 mg total) by mouth daily. 30 capsule 11   No current facility-administered medications for this visit.    Past Medical History  Diagnosis Date    . CAD in native artery     Nonobstructive 2005  . Hypertension   . Hyperlipidemia   . Prostatic hypertrophy, benign   . Glaucoma   . PSA elevation   . Abnormal EKG   . Renal insufficiency   . Routine general medical examination at a health care facility   . Hearing impairment   . Elevated PSA     Past Surgical History  Procedure Laterality Date  . Transurethral resection of prostate    . Cholecystectomy  03/2010    [facility] [place] [facility]  . Colonoscopy    . Tonsilectomy, adenoidectomy, bilateral myringotomy and tubes    . Removal of cyst      right arm  . Shoulder surgery      cyst removed left shoulder  . Bladder surgery    . Cataract extraction      ROS:  As stated in the HPI and negative for all other systems.  PHYSICAL EXAM BP 154/92 mmHg  Pulse 58  Ht 5\' 9"  (1.753 m)  Wt 173 lb 3.2 oz (78.563 kg)  BMI 25.57 kg/m2 GENERAL:  Well appearing NECK:  No jugular venous distention, waveform within normal limits, carotid upstroke brisk and symmetric, no bruits, no thyromegaly LUNGS:  Clear to auscultation bilaterally CHEST:  Unremarkable HEART:  PMI not displaced or sustained,S1 and S2 within normal limits, no S3, no S4, no clicks, no rubs, no murmurs  ABD:  Flat, positive bowel sounds normal in frequency in pitch, no bruits, no rebound, no guarding, no midline pulsatile mass, no hepatomegaly, no splenomegaly EXT:  2 plus pulses throughout, no edema, no cyanosis no clubbing   EKG:  Sinus bradycardia, rate 50, axis within normal limits, intervals within normal limits, left ventricular hypertrophy. T wave inversions consistent with repolarization changes and unchanged from previous. 06/16/2015  ASSESSMENT AND PLAN  HTN:  His blood pressure is slightly elevated today but this is unusual and is well controlled at home. He will continue the meds as listed.  CAD:  This nonobstructive disease and no further cardiovascular symptoms. No further cardiovascular testing is  suggested.  CARDIOMYOPATHY:  His EF was normal in 2014.  No change in therapy is indicated.   DYSLIPIDEMIA:  His most recent LDL was 119. He will continue on the meds as listed.  ABNORMAL EKG:  He has a markedly abnormal EKG.  This however, is repolarization and unchanged.  I previously gave him a copy of the EKG.

## 2015-06-16 NOTE — Patient Instructions (Signed)
Your physician wants you to follow-up in: 1 Year. You will receive a reminder letter in the mail two months in advance. If you don't receive a letter, please call our office to schedule the follow-up appointment.  

## 2015-07-14 ENCOUNTER — Other Ambulatory Visit: Payer: Self-pay | Admitting: Cardiology

## 2015-10-06 ENCOUNTER — Ambulatory Visit (INDEPENDENT_AMBULATORY_CARE_PROVIDER_SITE_OTHER): Payer: Federal, State, Local not specified - PPO | Admitting: Family Medicine

## 2015-10-06 ENCOUNTER — Encounter: Payer: Self-pay | Admitting: Family Medicine

## 2015-10-06 VITALS — BP 140/82 | HR 56 | Temp 98.5°F | Ht 69.0 in | Wt 172.8 lb

## 2015-10-06 DIAGNOSIS — I10 Essential (primary) hypertension: Secondary | ICD-10-CM | POA: Diagnosis not present

## 2015-10-06 DIAGNOSIS — E785 Hyperlipidemia, unspecified: Secondary | ICD-10-CM | POA: Diagnosis not present

## 2015-10-06 DIAGNOSIS — R8290 Unspecified abnormal findings in urine: Secondary | ICD-10-CM

## 2015-10-06 LAB — COMPREHENSIVE METABOLIC PANEL
ALBUMIN: 3.6 g/dL (ref 3.5–5.2)
ALK PHOS: 55 U/L (ref 39–117)
ALT: 7 U/L (ref 0–53)
AST: 14 U/L (ref 0–37)
BILIRUBIN TOTAL: 1 mg/dL (ref 0.2–1.2)
BUN: 17 mg/dL (ref 6–23)
CALCIUM: 9 mg/dL (ref 8.4–10.5)
CO2: 28 mEq/L (ref 19–32)
Chloride: 105 mEq/L (ref 96–112)
Creatinine, Ser: 1.52 mg/dL — ABNORMAL HIGH (ref 0.40–1.50)
GFR: 56.79 mL/min — AB (ref >60.00)
Glucose, Bld: 103 mg/dL — ABNORMAL HIGH (ref 70–99)
POTASSIUM: 4 meq/L (ref 3.5–5.1)
Sodium: 140 mEq/L (ref 135–145)
TOTAL PROTEIN: 7.1 g/dL (ref 6.0–8.3)

## 2015-10-06 LAB — MICROALBUMIN / CREATININE URINE RATIO
Creatinine,U: 511.7 mg/dL
MICROALB/CREAT RATIO: 1.8 mg/g (ref 0.0–30.0)
Microalb, Ur: 9.4 mg/dL — ABNORMAL HIGH (ref 0.0–1.9)

## 2015-10-06 LAB — POCT URINALYSIS DIPSTICK
Blood, UA: NEGATIVE
Glucose, UA: NEGATIVE
NITRITE UA: NEGATIVE
PH UA: 6
Spec Grav, UA: 1.02
UROBILINOGEN UA: NEGATIVE

## 2015-10-06 LAB — LIPID PANEL
CHOLESTEROL: 201 mg/dL — AB (ref 0–200)
HDL: 36.4 mg/dL — AB (ref >39.00)
LDL Cholesterol: 126 mg/dL — ABNORMAL HIGH (ref 0–99)
NONHDL: 164.69
TRIGLYCERIDES: 194 mg/dL — AB (ref 0.0–149.0)
Total CHOL/HDL Ratio: 6
VLDL: 38.8 mg/dL (ref 0.0–40.0)

## 2015-10-06 NOTE — Progress Notes (Signed)
Pre visit review using our clinic review tool, if applicable. No additional management support is needed unless otherwise documented below in the visit note. 

## 2015-10-06 NOTE — Patient Instructions (Signed)

## 2015-10-06 NOTE — Progress Notes (Signed)
Patient ID: [name] [name], male    DOB: 03/26/1932  Age: 82 y.o. MRN: [id number]    Subjective:  Subjective HPI [name] [name] presents for f/u bp and cholesterol.  He saw nephrology on 1/17 and they are watching his bp and kidney function.   Review of Systems  Constitutional: Negative for diaphoresis, appetite change, fatigue and unexpected weight change.  Eyes: Negative for pain, redness and visual disturbance.  Respiratory: Negative for cough, chest tightness, shortness of breath and wheezing.   Cardiovascular: Negative for chest pain, palpitations and leg swelling.  Endocrine: Negative for cold intolerance, heat intolerance, polydipsia, polyphagia and polyuria.  Genitourinary: Negative for dysuria, frequency and difficulty urinating.  Neurological: Negative for dizziness, light-headedness, numbness and headaches.    History Past Medical History  Diagnosis Date  . CAD in native artery     Nonobstructive 2005  . Hypertension   . Hyperlipidemia   . Prostatic hypertrophy, benign   . Glaucoma   . PSA elevation   . Abnormal EKG   . Renal insufficiency   . Routine general medical examination at a health care facility   . Hearing impairment   . Elevated PSA     He has past surgical history that includes Transurethral resection of prostate; Cholecystectomy (03/2010); Colonoscopy; Tonsilectomy, adenoidectomy, bilateral myringotomy and tubes; removal of cyst; Shoulder surgery; Bladder surgery; and Cataract extraction.   His family history includes Coronary artery disease in his mother; Diabetes in his sister; Heart disease in his mother; Heart disease (age of onset: 68) in his sister; Hypertension in his daughter, mother, and son.He reports that he quit smoking about 22 years ago. His smoking use included Cigarettes. He has a 6 pack-year smoking history. He has never used smokeless tobacco. He reports that he does not drink alcohol or use illicit drugs.  Current Outpatient  Prescriptions on File Prior to Visit  Medication Sig Dispense Refill  . aspirin 81 MG tablet Take 81 mg by mouth. Take one tab twice a week     . cloNIDine (CATAPRES) 0.1 MG tablet Take 1 tablet (0.1 mg total) by mouth 2 (two) times daily. 60 tablet 5  . dorzolamide-timolol (COSOPT) 22.3-6.8 MG/ML ophthalmic solution 1 drop 2 (two) times daily.      . latanoprost (XALATAN) 0.005 % ophthalmic solution Place 1 drop into both eyes at bedtime.    . MATZIM LA 420 MG 24 hr tablet TAKE ONE TABLET BY MOUTH ONCE DAILY 30 tablet 5  . metoprolol succinate (TOPROL-XL) 25 MG 24 hr tablet TAKE ONE TABLET BY MOUTH ONCE DAILY 30 tablet 11  . omeprazole (PRILOSEC) 40 MG capsule Take 1 capsule (40 mg total) by mouth daily. 30 capsule 11  . pilocarpine (PILOCAR) 1 % ophthalmic solution Place 1 drop into the left eye 3 (three) times daily.    . rosuvastatin (CRESTOR) 20 MG tablet Take 1 tablet (20 mg total) by mouth at bedtime. 30 tablet 5  . sildenafil (VIAGRA) 100 MG tablet Take 0.5-1 tablets (50-100 mg total) by mouth daily as needed for erectile dysfunction. 3 tablet 0   No current facility-administered medications on file prior to visit.     Objective:  Objective Physical Exam  Constitutional: He is oriented to person, place, and time. Vital signs are normal. He appears well-developed and well-nourished. He is sleeping.  HENT:  Head: Normocephalic and atraumatic.  Mouth/Throat: Oropharynx is clear and moist.  Eyes: EOM are normal. Pupils are equal, round, and reactive to light.  Neck:   Normal range of motion. Neck supple. No thyromegaly present.  Cardiovascular: Normal rate and regular rhythm.   No murmur heard. Pulmonary/Chest: Effort normal and breath sounds normal. No respiratory distress. He has no wheezes. He has no rales. He exhibits no tenderness.  Musculoskeletal: He exhibits no edema or tenderness.  Neurological: He is alert and oriented to person, place, and time.  Skin: Skin is warm and dry.   Psychiatric: He has a normal mood and affect. His behavior is normal. Judgment and thought content normal.  Nursing note and vitals reviewed.  BP 140/82 mmHg  Pulse 56  Temp(Src) 98.5 F (36.9 C) (Oral)  Ht 5' 9" (1.753 m)  Wt 172 lb 12.8 oz (78.382 kg)  BMI 25.51 kg/m2  SpO2 96% Wt Readings from Last 3 Encounters:  10/06/15 172 lb 12.8 oz (78.382 kg)  06/16/15 173 lb 3.2 oz (78.563 kg)  04/01/15 177 lb 9.6 oz (80.559 kg)     Lab Results  Component Value Date   WBC 8.0 04/01/2015   HGB 13.4 04/01/2015   HCT 39.7 04/01/2015   PLT 220.0 04/01/2015   GLUCOSE 103* 10/06/2015   CHOL 201* 10/06/2015   TRIG 194.0* 10/06/2015   HDL 36.40* 10/06/2015   LDLDIRECT 170.2 10/09/2007   LDLCALC 126* 10/06/2015   ALT 7 10/06/2015   AST 14 10/06/2015   NA 140 10/06/2015   K 4.0 10/06/2015   CL 105 10/06/2015   CREATININE 1.52* 10/06/2015   BUN 17 10/06/2015   CO2 28 10/06/2015   TSH 1.38 05/19/2010   PSA 25.86* 04/01/2015   HGBA1C 5.1 04/02/2013   MICROALBUR 9.4* 10/06/2015    Dg Cholangiogram Operative  06/30/2010  Clinical Data:   Gallstones.  INTRAOPERATIVE CHOLANGIOGRAM  Comparison: Ultrasound 05/22/2010  Findings: Intraoperative spot images show normal caliber biliary system.  No evidence of retained stone or obstruction.  Free passage of contrast into the small bowel noted.  IMPRESSION: No evidence of retained stone or obstruction.  These images were submitted for radiologic interpretation only. Please see the procedural report for the amount of contrast and the fluoroscopy time utilized. Provider: Charlton Haws    Assessment & Plan:  Plan I am having [name] maintain his dorzolamide-timolol, aspirin, sildenafil, omeprazole, rosuvastatin, latanoprost, cloNIDine, MATZIM LA, pilocarpine, and metoprolol succinate.  No orders of the defined types were placed in this encounter.    Problem List Items Addressed This Visit      Unprioritized   Hyperlipidemia - Primary     con't crestor Check labs Pt requesting to come 1x a year.  If we can space cardio and our appoint 6 m apart and cardiology does his labs also --- I agreed to see him once a year      Relevant Orders   Comp Met (CMET) (Completed)   Lipid panel (Completed)   Microalbumin / creatinine urine ratio (Completed)   POCT urinalysis dipstick (Completed)   Essential hypertension   Relevant Orders   Comp Met (CMET) (Completed)   Lipid panel (Completed)   Microalbumin / creatinine urine ratio (Completed)   POCT urinalysis dipstick (Completed)    Other Visit Diagnoses    Abnormal urine        Relevant Orders    Urine Culture       Follow-up: Return in about 6 months (around 04/04/2016), or if symptoms worsen or fail to improve, for annual exam, fasting.  Garnet Koyanagi, DO

## 2015-10-06 NOTE — Assessment & Plan Note (Signed)
con't crestor Check labs Pt requesting to come 1x a year.  If we can space cardio and our appoint 6 m apart and cardiology does his labs also --- I agreed to see him once a year

## 2015-10-07 LAB — URINE CULTURE
COLONY COUNT: NO GROWTH
Organism ID, Bacteria: NO GROWTH

## 2015-10-08 MED ORDER — ROSUVASTATIN CALCIUM 20 MG PO TABS: 30.0000 mg | ORAL_TABLET | Freq: Every day | ORAL | Status: DC

## 2015-10-08 NOTE — Addendum Note (Signed)
Addended by: Ricky Ala on: 10/08/2015 03:42 PM   Modules accepted: Orders

## 2015-10-09 MED ORDER — ATORVASTATIN CALCIUM 20 MG PO TABS: 20.0000 mg | ORAL_TABLET | Freq: Every day | ORAL | Status: DC

## 2015-10-09 NOTE — Addendum Note (Signed)
Addended by: Ricky Ala on: 10/09/2015 02:55 PM   Modules accepted: Orders, Medications

## 2015-11-17 ENCOUNTER — Encounter: Payer: Self-pay | Admitting: Gastroenterology

## 2015-12-29 ENCOUNTER — Other Ambulatory Visit: Payer: Self-pay | Admitting: Family Medicine

## 2016-02-24 ENCOUNTER — Other Ambulatory Visit: Payer: Self-pay | Admitting: Family Medicine

## 2016-03-02 ENCOUNTER — Encounter: Payer: Self-pay | Admitting: Gastroenterology

## 2016-04-06 ENCOUNTER — Ambulatory Visit (INDEPENDENT_AMBULATORY_CARE_PROVIDER_SITE_OTHER): Payer: Federal, State, Local not specified - PPO | Admitting: Family Medicine

## 2016-04-06 ENCOUNTER — Encounter: Payer: Self-pay | Admitting: Family Medicine

## 2016-04-06 VITALS — BP 164/86 | HR 58 | Temp 98.3°F | Ht 68.0 in | Wt 170.4 lb

## 2016-04-06 DIAGNOSIS — E785 Hyperlipidemia, unspecified: Secondary | ICD-10-CM | POA: Diagnosis not present

## 2016-04-06 DIAGNOSIS — R1013 Epigastric pain: Secondary | ICD-10-CM

## 2016-04-06 DIAGNOSIS — N528 Other male erectile dysfunction: Secondary | ICD-10-CM | POA: Diagnosis not present

## 2016-04-06 DIAGNOSIS — I10 Essential (primary) hypertension: Secondary | ICD-10-CM | POA: Diagnosis not present

## 2016-04-06 DIAGNOSIS — Z0001 Encounter for general adult medical examination with abnormal findings: Secondary | ICD-10-CM

## 2016-04-06 DIAGNOSIS — Z0000 Encounter for general adult medical examination without abnormal findings: Secondary | ICD-10-CM

## 2016-04-06 LAB — COMPREHENSIVE METABOLIC PANEL
ALT: 12 U/L (ref 0–53)
AST: 19 U/L (ref 0–37)
Albumin: 4.1 g/dL (ref 3.5–5.2)
Alkaline Phosphatase: 52 U/L (ref 39–117)
BILIRUBIN TOTAL: 1.4 mg/dL — AB (ref 0.2–1.2)
BUN: 15 mg/dL (ref 6–23)
CALCIUM: 9.4 mg/dL (ref 8.4–10.5)
CHLORIDE: 107 meq/L (ref 96–112)
CO2: 27 meq/L (ref 19–32)
CREATININE: 1.32 mg/dL (ref 0.40–1.50)
GFR: 66.74 mL/min (ref >60.00)
GLUCOSE: 103 mg/dL — AB (ref 70–99)
Potassium: 3.8 mEq/L (ref 3.5–5.1)
SODIUM: 141 meq/L (ref 135–145)
Total Protein: 7.4 g/dL (ref 6.0–8.3)

## 2016-04-06 LAB — CBC
HEMATOCRIT: 40.2 % (ref 39.0–52.0)
Hemoglobin: 13.4 g/dL (ref 13.0–17.0)
MCHC: 33.2 g/dL (ref 30.0–36.0)
MCV: 90.9 fl (ref 78.0–100.0)
Platelets: 208 10*3/uL (ref 150.0–400.0)
RBC: 4.43 Mil/uL (ref 4.22–5.81)
RDW: 13.5 % (ref 11.5–15.5)
WBC: 8.7 10*3/uL (ref 4.0–10.5)

## 2016-04-06 LAB — LIPID PANEL
CHOL/HDL RATIO: 3
CHOLESTEROL: 125 mg/dL (ref 0–200)
HDL: 37.8 mg/dL — ABNORMAL LOW (ref >39.00)
LDL CALC: 63 mg/dL (ref 0–99)
NonHDL: 87.26
TRIGLYCERIDES: 123 mg/dL (ref 0.0–149.0)
VLDL: 24.6 mg/dL (ref 0.0–40.0)

## 2016-04-06 LAB — PSA: PSA: 31.37 ng/mL — ABNORMAL HIGH (ref 0.10–4.00)

## 2016-04-06 MED ORDER — OMEPRAZOLE 40 MG PO CPDR: 40.0000 mg | DELAYED_RELEASE_CAPSULE | Freq: Every day | ORAL | 11 refills | Status: DC

## 2016-04-06 MED ORDER — DILTIAZEM HCL ER COATED BEADS 420 MG PO TB24: 420.0000 mg | ORAL_TABLET | Freq: Every day | ORAL | 5 refills | Status: DC

## 2016-04-06 MED ORDER — ATORVASTATIN CALCIUM 20 MG PO TABS: 20.0000 mg | ORAL_TABLET | Freq: Every day | ORAL | 2 refills | Status: DC

## 2016-04-06 MED ORDER — METOPROLOL SUCCINATE ER 25 MG PO TB24: 25.0000 mg | ORAL_TABLET | Freq: Every day | ORAL | 11 refills | Status: DC

## 2016-04-06 MED ORDER — CLONIDINE HCL 0.1 MG PO TABS: 0.1000 mg | ORAL_TABLET | Freq: Two times a day (BID) | ORAL | 5 refills | Status: DC

## 2016-04-06 NOTE — Progress Notes (Signed)
Subjective:   [name] [name] is a 82 y.o. male who presents for Medicare Annual/Subsequent preventive examination.  Review of Systems:   Review of Systems  Constitutional: Negative for activity change, appetite change and fatigue.  HENT: Negative for hearing loss, congestion, tinnitus and ear discharge.   Eyes: Negative for visual disturbance (see optho q1y -- vision corrected to 20/20 with glasses).  Respiratory: Negative for cough, chest tightness and shortness of breath.   Cardiovascular: Negative for chest pain, palpitations and leg swelling.  Gastrointestinal: Negative for abdominal pain, diarrhea, constipation and abdominal distention.  Genitourinary: Negative for urgency, frequency, decreased urine volume and difficulty urinating.  Musculoskeletal: Negative for back pain, arthralgias and gait problem.  Skin: Negative for color change, pallor and rash.  Neurological: Negative for dizziness, light-headedness, numbness and headaches.  Hematological: Negative for adenopathy. Does not bruise/bleed easily.  Psychiatric/Behavioral: Negative for suicidal ideas, confusion, sleep disturbance, self-injury, dysphoric mood, decreased concentration and agitation.  Pt is able to read and write and can do all ADLs No risk for falling No abuse/ violence in home          Objective:    Vitals: BP (!) 198/92 (BP Location: Left Arm, Patient Position: Sitting, Cuff Size: Normal)   Pulse (!) 58   Temp 98.3 F (36.8 C) (Oral)   Ht 5\' 8"  (1.727 m)   Wt 170 lb 6.4 oz (77.3 kg)   SpO2 99%   BMI 25.91 kg/m   Body mass index is 25.91 kg/m. BP (!) 198/92 (BP Location: Left Arm, Patient Position: Sitting, Cuff Size: Normal)   Pulse (!) 58   Temp 98.3 F (36.8 C) (Oral)   Ht 5\' 8"  (1.727 m)   Wt 170 lb 6.4 oz (77.3 kg)   SpO2 99%   BMI 25.91 kg/m  General appearance: alert, cooperative, appears stated age and no distress Head: Normocephalic, without obvious abnormality, atraumatic Eyes:  conjunctivae/corneas clear. PERRL, EOM's intact. Fundi benign. Ears: normal TM's and external ear canals both ears Nose: Nares normal. Septum midline. Mucosa normal. No drainage or sinus tenderness. Throat: lips, mucosa, and tongue normal; teeth and gums normal Neck: no adenopathy, no carotid bruit, no JVD, supple, symmetrical, trachea midline and thyroid not enlarged, symmetric, no tenderness/mass/nodules Back: symmetric, no curvature. ROM normal. No CVA tenderness. Lungs: clear to auscultation bilaterally Chest wall: no tenderness Heart: regular rate and rhythm, S1, S2 normal, no murmur, click, rub or gallop Abdomen: soft, non-tender; bowel sounds normal; no masses,  no organomegaly Male genitalia: deferred--- per urology Rectal: normal tone, normal prostate, no masses or tenderness Extremities: extremities normal, atraumatic, no cyanosis or edema Pulses: 2+ and symmetric Skin: Skin color, texture, turgor normal. No rashes or lesions Lymph nodes: Cervical, supraclavicular, and axillary nodes normal. Neurologic: Alert and oriented X 3, normal strength and tone. Normal symmetric reflexes. Normal coordination and gait Tobacco History  Smoking Status  . Former Smoker  . Packs/day: 0.30  . Years: 20.00  . Types: Cigarettes  . Quit date: 02/22/1993  Smokeless Tobacco  . Never Used     Counseling given: Not Answered   Past Medical History:  Diagnosis Date  . Abnormal EKG   . CAD in native artery    Nonobstructive 2005  . Elevated PSA   . Glaucoma   . Hearing impairment   . Hyperlipidemia   . Hypertension   . Prostatic hypertrophy, benign   . PSA elevation   . Renal insufficiency   . Routine general medical examination at  a health care facility    Past Surgical History:  Procedure Laterality Date  . BLADDER SURGERY    . CATARACT EXTRACTION    . CHOLECYSTECTOMY  03/2010   [facility] [place] [facility]  . COLONOSCOPY    . removal of cyst     right arm  . SHOULDER SURGERY      cyst removed left shoulder  . TONSILECTOMY, ADENOIDECTOMY, BILATERAL MYRINGOTOMY AND TUBES    . TRANSURETHRAL RESECTION OF PROSTATE     Family History  Problem Relation Age of Onset  . Coronary artery disease Mother   . Hypertension Mother   . Heart disease Mother   . Diabetes Sister   . Heart disease Sister 10    died during cabg  . Hypertension Daughter   . Hypertension Son    History  Sexual Activity  . Sexual activity: Yes  . Partners: Female    Outpatient Encounter Prescriptions as of 04/06/2016  Medication Sig  . apraclonidine (IOPIDINE) 0.5 % ophthalmic solution Place 1 drop into the right eye every 12 (twelve) hours.  Marland Kitchen aspirin 81 MG tablet Take 81 mg by mouth. Take one tab twice a week   . atorvastatin (LIPITOR) 20 MG tablet Take 1 tablet (20 mg total) by mouth daily.  . cloNIDine (CATAPRES) 0.1 MG tablet Take 1 tablet (0.1 mg total) by mouth 2 (two) times daily.  Marland Kitchen diltiazem (MATZIM LA) 420 MG 24 hr tablet Take 1 tablet (420 mg total) by mouth daily.  . dorzolamide (TRUSOPT) 2 % ophthalmic solution Place 1 drop into the right eye daily.  . dorzolamide-timolol (COSOPT) 22.3-6.8 MG/ML ophthalmic solution 1 drop 2 (two) times daily.    Marland Kitchen latanoprost (XALATAN) 0.005 % ophthalmic solution Place 1 drop into both eyes at bedtime.  . metoprolol succinate (TOPROL-XL) 25 MG 24 hr tablet Take 1 tablet (25 mg total) by mouth daily.  . pilocarpine (PILOCAR) 1 % ophthalmic solution Place 1 drop into the left eye 3 (three) times daily.  . sildenafil (VIAGRA) 100 MG tablet Take 0.5-1 tablets (50-100 mg total) by mouth daily as needed for erectile dysfunction.  . [DISCONTINUED] atorvastatin (LIPITOR) 20 MG tablet Take 1 tablet (20 mg total) by mouth daily.  . [DISCONTINUED] cloNIDine (CATAPRES) 0.1 MG tablet Take 1 tablet (0.1 mg total) by mouth 2 (two) times daily.  . [DISCONTINUED] MATZIM LA 420 MG 24 hr tablet TAKE ONE TABLET BY MOUTH ONCE DAILY  . [DISCONTINUED] metoprolol succinate  (TOPROL-XL) 25 MG 24 hr tablet TAKE ONE TABLET BY MOUTH ONCE DAILY  . omeprazole (PRILOSEC) 40 MG capsule Take 1 capsule (40 mg total) by mouth daily.  . [DISCONTINUED] omeprazole (PRILOSEC) 40 MG capsule Take 1 capsule (40 mg total) by mouth daily.   No facility-administered encounter medications on file as of 04/06/2016.     Activities of Daily Living In your present state of health, do you have any difficulty performing the following activities: 04/06/2016 10/06/2015  Hearing? Tempie Donning  Vision? Y Y  Difficulty concentrating or making decisions? N N  Walking or climbing stairs? N N  Dressing or bathing? N N  Doing errands, shopping? N N  Some recent data might be hidden    Patient Care Team: [name], DO as PCP - General Warden Fillers, MD as Consulting Physician (Ophthalmology) Minus Breeding, MD as Consulting Physician (Cardiology) Ladene Artist, MD as Consulting Physician (Gastroenterology) Rondel Oh, MD as Referring Physician (Ophthalmology) Fleet Contras, MD as Consulting Physician (Nephrology)  Assessment:    CPE Exercise Activities and Dietary recommendations Current Exercise Habits: Home exercise routine, Type of exercise: walking;strength training/weights (sit up machine. elliptical), Time (Minutes): 60, Frequency (Times/Week): 4, Weekly Exercise (Minutes/Week): 240, Intensity: Mild, Exercise limited by: None identified  Goals    None     Fall Risk Fall Risk  04/06/2016 10/06/2015 04/01/2015 10/01/2014 04/02/2013  Falls in the past year? No No No No No   Depression Screen PHQ 2/9 Scores 04/06/2016 10/06/2015 04/01/2015 10/01/2014  PHQ - 2 Score 0 0 0 0    Cognitive Testing MMSE - Mini Mental State Exam 04/06/2016  Orientation to time 5  Orientation to Place 5  Registration 3  Attention/ Calculation 5  Recall 3  Language- name 2 objects 2  Language- repeat 1  Language- follow 3 step command 3  Language- read & follow direction 1  Write a  sentence 1  Copy design 1  Total score 30    Immunization History  Administered Date(s) Administered  . Influenza Whole 08/03/2007   Screening Tests Health Maintenance  Topic Date Due  . PNA vac Low Risk Adult (1 of 2 - PCV13) 01/12/1999  . COLONOSCOPY  04/05/2016  . INFLUENZA VACCINE  10/05/2016 (Originally 04/13/2016)  . TETANUS/TDAP  10/05/2016 (Originally 02/23/2015)  . ZOSTAVAX  Addressed      Plan:    During the course of the visit the patient was educated and counseled about the following appropriate screening and preventive services:   Vaccines to include Pneumoccal, Influenza, Hepatitis B, Td, Zostavax, HCV  Electrocardiogram  Cardiovascular Disease  Colorectal cancer screening  Diabetes screening  Prostate Cancer Screening  Glaucoma screening  Nutrition counseling   Smoking cessation counseling  Patient Instructions (the written plan) was given to thepatient.   Pt refuses pneumonia vaccines or any other immunization 1. Dyspepsia   - omeprazole (PRILOSEC) 40 MG capsule; Take 1 capsule (40 mg total) by mouth daily.  Dispense: 30 capsule; Refill: 11  2. Essential hypertension Slightly elevated-- pt had not taken all his meds Recheck 2-3 weeks , bring meds with him - cloNIDine (CATAPRES) 0.1 MG tablet; Take 1 tablet (0.1 mg total) by mouth 2 (two) times daily.  Dispense: 60 tablet; Refill: 5 - metoprolol succinate (TOPROL-XL) 25 MG 24 hr tablet; Take 1 tablet (25 mg total) by mouth daily.  Dispense: 30 tablet; Refill: 11 - diltiazem (MATZIM LA) 420 MG 24 hr tablet; Take 1 tablet (420 mg total) by mouth daily.  Dispense: 30 tablet; Refill: 5 - Comprehensive metabolic panel - CBC - Lipid panel - POCT urinalysis dipstick - PSA  3. Hyperlipidemia - atorvastatin (LIPITOR) 20 MG tablet; Take 1 tablet (20 mg total) by mouth daily.  Dispense: 30 tablet; Refill: 2 - Comprehensive metabolic panel - CBC - Lipid panel - POCT urinalysis dipstick - PSA  4.  Other male erectile dysfunction  - PSA  5. Preventative health care   [name], DO  04/06/2016

## 2016-04-06 NOTE — Progress Notes (Signed)
Pre visit review using our clinic review tool, if applicable. No additional management support is needed unless otherwise documented below in the visit note. 

## 2016-04-06 NOTE — Patient Instructions (Signed)

## 2016-04-06 NOTE — Progress Notes (Signed)
Subjective:    Patient ID: [name] [name], male    DOB: Oct 07, 1931, 82 y.o.   MRN: [id number]  Chief Complaint  Patient presents with  . Annual Exam    Fasting    HPI Patient is in today for cpe and med refill--- no complaints.   Past Medical History:  Diagnosis Date  . Abnormal EKG   . CAD in native artery    Nonobstructive 2005  . Elevated PSA   . Glaucoma   . Hearing impairment   . Hyperlipidemia   . Hypertension   . Prostatic hypertrophy, benign   . PSA elevation   . Renal insufficiency   . Routine general medical examination at a health care facility     Past Surgical History:  Procedure Laterality Date  . BLADDER SURGERY    . CATARACT EXTRACTION    . CHOLECYSTECTOMY  03/2010   [facility] [place] [facility]  . COLONOSCOPY    . removal of cyst     right arm  . SHOULDER SURGERY     cyst removed left shoulder  . TONSILECTOMY, ADENOIDECTOMY, BILATERAL MYRINGOTOMY AND TUBES    . TRANSURETHRAL RESECTION OF PROSTATE      Family History  Problem Relation Age of Onset  . Coronary artery disease Mother   . Hypertension Mother   . Heart disease Mother   . Diabetes Sister   . Heart disease Sister 19    died during cabg  . Hypertension Daughter   . Hypertension Son     Social History   Social History  . Marital status: Married    Spouse name: N/A  . Number of children: N/A  . Years of education: N/A   Occupational History  . retired Actor    Social History Main Topics  . Smoking status: Former Smoker    Packs/day: 0.30    Years: 20.00    Types: Cigarettes    Quit date: 02/22/1993  . Smokeless tobacco: Never Used  . Alcohol use No  . Drug use: No  . Sexual activity: Yes    Partners: Female   Other Topics Concern  . Not on file   Social History Narrative   Exercise--- stair step,  Sit ups, pull ups and bike    Outpatient Medications Prior to Visit  Medication Sig Dispense Refill  . aspirin 81 MG tablet Take 81 mg by mouth. Take one tab  twice a week     . atorvastatin (LIPITOR) 20 MG tablet Take 1 tablet (20 mg total) by mouth daily. 30 tablet 2  . cloNIDine (CATAPRES) 0.1 MG tablet Take 1 tablet (0.1 mg total) by mouth 2 (two) times daily. 60 tablet 5  . dorzolamide-timolol (COSOPT) 22.3-6.8 MG/ML ophthalmic solution 1 drop 2 (two) times daily.      Marland Kitchen latanoprost (XALATAN) 0.005 % ophthalmic solution Place 1 drop into both eyes at bedtime.    Marland Kitchen MATZIM LA 420 MG 24 hr tablet TAKE ONE TABLET BY MOUTH ONCE DAILY 30 tablet 5  . metoprolol succinate (TOPROL-XL) 25 MG 24 hr tablet TAKE ONE TABLET BY MOUTH ONCE DAILY 30 tablet 11  . pilocarpine (PILOCAR) 1 % ophthalmic solution Place 1 drop into the left eye 3 (three) times daily.    . sildenafil (VIAGRA) 100 MG tablet Take 0.5-1 tablets (50-100 mg total) by mouth daily as needed for erectile dysfunction. 3 tablet 0  . omeprazole (PRILOSEC) 40 MG capsule Take 1 capsule (40 mg total) by mouth daily. [place]  capsule 11   No facility-administered medications prior to visit.     No Known Allergies  ROS  Review of Systems  Constitutional: Negative for activity change, appetite change and fatigue.  HENT: Negative for hearing loss, congestion, tinnitus and ear discharge.   Eyes: Negative for visual disturbance (see optho q1y -- vision corrected to 20/20 with glasses).  Respiratory: Negative for cough, chest tightness and shortness of breath.   Cardiovascular: Negative for chest pain, palpitations and leg swelling.  Gastrointestinal: Negative for abdominal pain, diarrhea, constipation and abdominal distention.  Genitourinary: Negative for urgency, frequency, decreased urine volume and difficulty urinating.  Musculoskeletal: Negative for back pain, arthralgias and gait problem.  Skin: Negative for color change, pallor and rash.  Neurological: Negative for dizziness, light-headedness, numbness and headaches.  Hematological: Negative for adenopathy. Does not bruise/bleed easily.    Psychiatric/Behavioral: Negative for suicidal ideas, confusion, sleep disturbance, self-injury, dysphoric mood, decreased concentration and agitation.  Pt is able to read and write and can do all ADLs No risk for falling No abuse/ violence in home       Objective:    Physical Exam  BP (!) 198/92 (BP Location: Left Arm, Patient Position: Sitting, Cuff Size: Normal)   Pulse (!) 58   Temp 98.3 F (36.8 C) (Oral)   Ht 5\' 8"  (1.727 m)   Wt 170 lb 6.4 oz (77.3 kg)   SpO2 99%   BMI 25.91 kg/m  Wt Readings from Last 3 Encounters:  04/06/16 170 lb 6.4 oz (77.3 kg)  10/06/15 172 lb 12.8 oz (78.4 kg)  06/16/15 173 lb 3.2 oz (78.6 kg)   BP (!) 164/86   Pulse (!) 58   Temp 98.3 F (36.8 C) (Oral)   Ht 5\' 8"  (1.727 m)   Wt 170 lb 6.4 oz (77.3 kg)   SpO2 99%   BMI 25.91 kg/m  General appearance: alert, cooperative, appears stated age and no distress Head: Normocephalic, without obvious abnormality, atraumatic Eyes: conjunctivae/corneas clear. PERRL, EOM's intact. Fundi benign. Ears: normal TM's and external ear canals both ears Nose: Nares normal. Septum midline. Mucosa normal. No drainage or sinus tenderness. Throat: lips, mucosa, and tongue normal; teeth and gums normal Neck: no adenopathy, no carotid bruit, no JVD, supple, symmetrical, trachea midline and thyroid not enlarged, symmetric, no tenderness/mass/nodules Back: symmetric, no curvature. ROM normal. No CVA tenderness. Lungs: clear to auscultation bilaterally Chest wall: no tenderness Heart: regular rate and rhythm, S1, S2 normal, no murmur, click, rub or gallop Abdomen: soft, non-tender; bowel sounds normal; no masses,  no organomegaly Male genitalia: deferred--urology Rectal: deferred--urology Extremities: extremities normal, atraumatic, no cyanosis or edema Pulses: 2+ and symmetric Skin: Skin color, texture, turgor normal. No rashes or lesions Lymph nodes: Cervical, supraclavicular, and axillary nodes  normal. Neurologic: Alert and oriented X 3, normal strength and tone. Normal symmetric reflexes. Normal coordination and gait  Lab Results  Component Value Date   WBC 8.0 04/01/2015   HGB 13.4 04/01/2015   HCT 39.7 04/01/2015   PLT 220.0 04/01/2015   GLUCOSE 103 (H) 10/06/2015   CHOL 201 (H) 10/06/2015   TRIG 194.0 (H) 10/06/2015   HDL 36.40 (L) 10/06/2015   LDLDIRECT 170.2 10/09/2007   LDLCALC 126 (H) 10/06/2015   ALT 7 10/06/2015   AST 14 10/06/2015   NA 140 10/06/2015   K 4.0 10/06/2015   CL 105 10/06/2015   CREATININE 1.52 (H) 10/06/2015   BUN 17 10/06/2015   CO2 28 10/06/2015   TSH 1.38 05/19/2010  PSA 25.86 (H) 04/01/2015   HGBA1C 5.1 04/02/2013   MICROALBUR 9.4 (H) 10/06/2015    Lab Results  Component Value Date   TSH 1.38 05/19/2010   Lab Results  Component Value Date   WBC 8.0 04/01/2015   HGB 13.4 04/01/2015   HCT 39.7 04/01/2015   MCV 92.2 04/01/2015   PLT 220.0 04/01/2015   Lab Results  Component Value Date   NA 140 10/06/2015   K 4.0 10/06/2015   CO2 28 10/06/2015   GLUCOSE 103 (H) 10/06/2015   BUN 17 10/06/2015   CREATININE 1.52 (H) 10/06/2015   BILITOT 1.0 10/06/2015   ALKPHOS 55 10/06/2015   AST 14 10/06/2015   ALT 7 10/06/2015   PROT 7.1 10/06/2015   ALBUMIN 3.6 10/06/2015   CALCIUM 9.0 10/06/2015   GFR 56.79 (L) 10/06/2015   Lab Results  Component Value Date   CHOL 201 (H) 10/06/2015   Lab Results  Component Value Date   HDL 36.40 (L) 10/06/2015   Lab Results  Component Value Date   LDLCALC 126 (H) 10/06/2015   Lab Results  Component Value Date   TRIG 194.0 (H) 10/06/2015   Lab Results  Component Value Date   CHOLHDL 6 10/06/2015   Lab Results  Component Value Date   HGBA1C 5.1 04/02/2013       Assessment & Plan:   Problem List Items Addressed This Visit    None    Visit Diagnoses   None.     I am having Mr. Allegretto maintain his dorzolamide-timolol, aspirin, sildenafil, omeprazole, latanoprost, cloNIDine,  pilocarpine, metoprolol succinate, atorvastatin, MATZIM LA, dorzolamide, and apraclonidine.  Meds ordered this encounter  Medications  . dorzolamide (TRUSOPT) 2 % ophthalmic solution    Sig: Place 1 drop into the right eye daily.  Marland Kitchen apraclonidine (IOPIDINE) 0.5 % ophthalmic solution    Sig: Place 1 drop into the right eye every 12 (twelve) hours.   1. Dyspepsia - omeprazole (PRILOSEC) 40 MG capsule; Take 1 capsule (40 mg total) by mouth daily.  Dispense: 30 capsule; Refill: 11  2. Essential hypertension stable - cloNIDine (CATAPRES) 0.1 MG tablet; Take 1 tablet (0.1 mg total) by mouth 2 (two) times daily.  Dispense: 60 tablet; Refill: 5 - metoprolol succinate (TOPROL-XL) 25 MG 24 hr tablet; Take 1 tablet (25 mg total) by mouth daily.  Dispense: 30 tablet; Refill: 11 - diltiazem (MATZIM LA) 420 MG 24 hr tablet; Take 1 tablet (420 mg total) by mouth daily.  Dispense: 30 tablet; Refill: 5 - Comprehensive metabolic panel - CBC - Lipid panel - POCT urinalysis dipstick - PSA  3. Hyperlipidemia   - atorvastatin (LIPITOR) 20 MG tablet; Take 1 tablet (20 mg total) by mouth daily.  Dispense: 30 tablet; Refill: 2 - Comprehensive metabolic panel - CBC - Lipid panel - POCT urinalysis dipstick - PSA  4. Other male erectile dysfunction   - PSA  5. Preventative health care    6. Routine history and physical examination of adult    [name], DO

## 2016-04-06 NOTE — Assessment & Plan Note (Signed)
See AVS  ghm-- pt refuses all vaccines

## 2016-04-12 ENCOUNTER — Other Ambulatory Visit: Payer: Self-pay | Admitting: Family Medicine

## 2016-04-20 ENCOUNTER — Ambulatory Visit (INDEPENDENT_AMBULATORY_CARE_PROVIDER_SITE_OTHER): Payer: Federal, State, Local not specified - PPO | Admitting: Family Medicine

## 2016-04-20 VITALS — BP 145/72 | HR 47

## 2016-04-20 DIAGNOSIS — I10 Essential (primary) hypertension: Secondary | ICD-10-CM | POA: Diagnosis not present

## 2016-04-20 NOTE — Progress Notes (Signed)
agree

## 2016-04-20 NOTE — Patient Instructions (Signed)
Per Dr. Carollee Herter: Continue current medication regimen and follow-up with PCP in 3 months.

## 2016-04-20 NOTE — Progress Notes (Signed)
Pre visit review using our clinic review tool, if applicable. No additional management support is needed unless otherwise documented below in the visit note.  Patient presents in office for blood pressure check per OV note 04/06/16. Reviewed medications with the patient. Today's readings were as follow: BP 143/70 P 48 & BP 145/72 P 47.  Per Dr. Carollee Herter: Continue current medication regimen and follow-up with PCP in 3 months.  Informed patient of the provider's instructions. He verbalized understanding and did not have any concerns prior to leaving the nurse visit.  Next appointment scheduled for 07/22/16.

## 2016-05-31 ENCOUNTER — Other Ambulatory Visit: Payer: Self-pay | Admitting: Family Medicine

## 2016-05-31 DIAGNOSIS — E785 Hyperlipidemia, unspecified: Secondary | ICD-10-CM

## 2016-05-31 MED ORDER — ATORVASTATIN CALCIUM 20 MG PO TABS: 20.0000 mg | ORAL_TABLET | Freq: Every day | ORAL | 5 refills | Status: DC

## 2016-06-27 NOTE — Progress Notes (Signed)
HPI The patient presents for follow up of a mildly reduced ejection fraction related to hypertension. He did have a cath 2005 with nonobstructive coronary disease.   I did an echo in 2014 and the EF was well preserved.  I have been following him for difficult to control hypertension. He's on multiple medications and his blood pressure is controlled.  Since I last saw him he has done well.  The patient denies any new symptoms such as chest discomfort, neck or arm discomfort. There has been no new shortness of breath, PND or orthopnea. There have been no reported palpitations, presyncope or syncope.  He uses an exercise bike and stair stepper without problems.   No Known Allergies  Current Outpatient Prescriptions  Medication Sig Dispense Refill  . apraclonidine (IOPIDINE) 0.5 % ophthalmic solution Place 1 drop into the right eye every 12 (twelve) hours.    Marland Kitchen aspirin 81 MG tablet Take 81 mg by mouth as needed.     Marland Kitchen atorvastatin (LIPITOR) 20 MG tablet Take 1 tablet (20 mg total) by mouth daily. 30 tablet 5  . cloNIDine (CATAPRES) 0.1 MG tablet TAKE ONE TABLET BY MOUTH TWICE DAILY 60 tablet 5  . diltiazem (MATZIM LA) 420 MG 24 hr tablet Take 1 tablet (420 mg total) by mouth daily. 30 tablet 5  . dorzolamide (TRUSOPT) 2 % ophthalmic solution Place 1 drop into the right eye daily.    . dorzolamide-timolol (COSOPT) 22.3-6.8 MG/ML ophthalmic solution 1 drop 2 (two) times daily.      Marland Kitchen latanoprost (XALATAN) 0.005 % ophthalmic solution Place 1 drop into both eyes at bedtime.    . metoprolol succinate (TOPROL-XL) 25 MG 24 hr tablet Take 1 tablet (25 mg total) by mouth daily. 30 tablet 11  . omeprazole (PRILOSEC) 40 MG capsule Take 1 capsule (40 mg total) by mouth daily. 30 capsule 11  . pilocarpine (PILOCAR) 1 % ophthalmic solution Place 1 drop into the left eye 3 (three) times daily.    . sildenafil (VIAGRA) 100 MG tablet Take 0.5-1 tablets (50-100 mg total) by mouth daily as needed for erectile  dysfunction. 3 tablet 0   No current facility-administered medications for this visit.     Past Medical History:  Diagnosis Date  . Abnormal EKG   . CAD in native artery    Nonobstructive 2005  . Elevated PSA   . Glaucoma   . Hearing impairment   . Hyperlipidemia   . Hypertension   . Prostatic hypertrophy, benign   . PSA elevation   . Renal insufficiency   . Routine general medical examination at a health care facility     Past Surgical History:  Procedure Laterality Date  . BLADDER SURGERY    . CATARACT EXTRACTION    . CHOLECYSTECTOMY  03/2010   [facility] [place] [facility]  . COLONOSCOPY    . removal of cyst     right arm  . SHOULDER SURGERY     cyst removed left shoulder  . TONSILECTOMY, ADENOIDECTOMY, BILATERAL MYRINGOTOMY AND TUBES    . TRANSURETHRAL RESECTION OF PROSTATE      ROS:  Glaucoma.  Otherwise as stated in the HPI and negative for all other systems.  PHYSICAL EXAM BP (!) 142/78   Pulse (!) 48   Ht 5\' 9"  (1.753 m)   Wt 172 lb 12.8 oz (78.4 kg)   BMI 25.52 kg/m  GENERAL:  Well appearing NECK:  No jugular venous distention, waveform within normal limits, carotid upstroke  brisk and symmetric, no bruits, no thyromegaly LUNGS:  Clear to auscultation bilaterally CHEST:  Unremarkable HEART:  PMI not displaced or sustained,S1 and S2 within normal limits, no S3, no S4, no clicks, no rubs, no murmurs ABD:  Flat, positive bowel sounds normal in frequency in pitch, no bruits, no rebound, no guarding, no midline pulsatile mass, no hepatomegaly, no splenomegaly EXT:  2 plus pulses throughout, no edema, no cyanosis no clubbing   EKG:  Sinus bradycardia, rate 48, axis within normal limits, intervals with prolonged QT, deep T-wave inversions unchanged from previous. 06/28/2016  ASSESSMENT AND PLAN  HTN:  His blood pressure is slightly elevated today but acceptable and is well controlled at home. He will continue the meds as listed.  CAD:  This nonobstructive  disease and no further cardiovascular symptoms. No further cardiovascular testing is suggested.  CARDIOMYOPATHY:  His EF was normal in 2014.  No change in therapy is indicated.   DYSLIPIDEMIA:  His most recent LDL was 63. He will continue on the meds as listed.  ABNORMAL EKG:  He has a markedly abnormal EKG.  This however, is repolarization and unchanged.  I previously gave him a copy of the EKG.   He has a long QT and should avoid QT prolonging drugs but he has had no QT syndrome.

## 2016-06-28 ENCOUNTER — Ambulatory Visit (INDEPENDENT_AMBULATORY_CARE_PROVIDER_SITE_OTHER): Payer: Federal, State, Local not specified - PPO | Admitting: Cardiology

## 2016-06-28 ENCOUNTER — Encounter: Payer: Self-pay | Admitting: Cardiology

## 2016-06-28 VITALS — BP 142/78 | HR 48 | Ht 69.0 in | Wt 172.8 lb

## 2016-06-28 DIAGNOSIS — I10 Essential (primary) hypertension: Secondary | ICD-10-CM | POA: Diagnosis not present

## 2016-06-28 DIAGNOSIS — R001 Bradycardia, unspecified: Secondary | ICD-10-CM | POA: Diagnosis not present

## 2016-06-28 NOTE — Patient Instructions (Signed)

## 2016-07-22 ENCOUNTER — Encounter: Payer: Self-pay | Admitting: Family Medicine

## 2016-07-22 ENCOUNTER — Ambulatory Visit (INDEPENDENT_AMBULATORY_CARE_PROVIDER_SITE_OTHER): Payer: Federal, State, Local not specified - PPO | Admitting: Family Medicine

## 2016-07-22 VITALS — BP 170/82 | HR 46 | Temp 98.0°F | Resp 16 | Ht 69.0 in | Wt 173.0 lb

## 2016-07-22 DIAGNOSIS — I10 Essential (primary) hypertension: Secondary | ICD-10-CM | POA: Diagnosis not present

## 2016-07-22 DIAGNOSIS — E785 Hyperlipidemia, unspecified: Secondary | ICD-10-CM

## 2016-07-22 LAB — COMPREHENSIVE METABOLIC PANEL
ALBUMIN: 3.9 g/dL (ref 3.5–5.2)
ALT: 10 U/L (ref 0–53)
AST: 15 U/L (ref 0–37)
Alkaline Phosphatase: 65 U/L (ref 39–117)
BILIRUBIN TOTAL: 1.3 mg/dL — AB (ref 0.2–1.2)
BUN: 13 mg/dL (ref 6–23)
CALCIUM: 9.2 mg/dL (ref 8.4–10.5)
CHLORIDE: 108 meq/L (ref 96–112)
CO2: 27 meq/L (ref 19–32)
CREATININE: 1.19 mg/dL (ref 0.40–1.50)
GFR: 75.17 mL/min (ref >60.00)
Glucose, Bld: 94 mg/dL (ref 70–99)
Potassium: 3.7 mEq/L (ref 3.5–5.1)
SODIUM: 142 meq/L (ref 135–145)
Total Protein: 7.3 g/dL (ref 6.0–8.3)

## 2016-07-22 LAB — LIPID PANEL
CHOL/HDL RATIO: 3
Cholesterol: 141 mg/dL (ref 0–200)
HDL: 43.8 mg/dL (ref >39.00)
LDL CALC: 77 mg/dL (ref 0–99)
NonHDL: 96.83
TRIGLYCERIDES: 98 mg/dL (ref 0.0–149.0)
VLDL: 19.6 mg/dL (ref 0.0–40.0)

## 2016-07-22 MED ORDER — CLONIDINE HCL 0.1 MG PO TABS: 0.1000 mg | ORAL_TABLET | Freq: Two times a day (BID) | ORAL | 5 refills | Status: DC

## 2016-07-22 MED ORDER — ATORVASTATIN CALCIUM 20 MG PO TABS: 20.0000 mg | ORAL_TABLET | Freq: Every day | ORAL | 5 refills | Status: DC

## 2016-07-22 MED ORDER — DILTIAZEM HCL ER COATED BEADS 420 MG PO TB24: 420.0000 mg | ORAL_TABLET | Freq: Every day | ORAL | 5 refills | Status: DC

## 2016-07-22 MED ORDER — METOPROLOL SUCCINATE ER 25 MG PO TB24: 25.0000 mg | ORAL_TABLET | Freq: Every day | ORAL | 11 refills | Status: DC

## 2016-07-22 NOTE — Progress Notes (Signed)
Patient ID: [name] [name], male    DOB: 11/18/1931  Age: 82 y.o. MRN: [id number]    Subjective:  Subjective  HPI [name] [name] presents for bp and cholesterol f/u.  bp running 120/80s at home.  No complaints. He has not taken his meds today.    Review of Systems  Constitutional: Negative for appetite change, diaphoresis, fatigue and unexpected weight change.  Eyes: Negative for pain, redness and visual disturbance.  Respiratory: Negative for cough, chest tightness, shortness of breath and wheezing.   Cardiovascular: Negative for chest pain, palpitations and leg swelling.  Endocrine: Negative for cold intolerance, heat intolerance, polydipsia, polyphagia and polyuria.  Genitourinary: Negative for difficulty urinating, dysuria and frequency.  Neurological: Negative for dizziness, light-headedness, numbness and headaches.    History Past Medical History:  Diagnosis Date  . Abnormal EKG   . CAD in native artery    Nonobstructive 2005  . Elevated PSA   . Glaucoma   . Hearing impairment   . Hyperlipidemia   . Hypertension   . Prostatic hypertrophy, benign   . PSA elevation   . Renal insufficiency   . Routine general medical examination at a health care facility     He has a past surgical history that includes Transurethral resection of prostate; Cholecystectomy (03/2010); Colonoscopy; Tonsilectomy, adenoidectomy, bilateral myringotomy and tubes; removal of cyst; Shoulder surgery; Bladder surgery; and Cataract extraction.   His family history includes Coronary artery disease in his mother; Diabetes in his sister; Heart disease in his mother; Heart disease (age of onset: 54) in his sister; Hypertension in his daughter, mother, and son.He reports that he quit smoking about 23 years ago. His smoking use included Cigarettes. He has a 6.00 pack-year smoking history. He has never used smokeless tobacco. He reports that he does not drink alcohol or use drugs.  Current Outpatient  Prescriptions on File Prior to Visit  Medication Sig Dispense Refill  . apraclonidine (IOPIDINE) 0.5 % ophthalmic solution Place 1 drop into the right eye every 12 (twelve) hours.    Marland Kitchen aspirin 81 MG tablet Take 81 mg by mouth as needed.     . dorzolamide (TRUSOPT) 2 % ophthalmic solution Place 1 drop into the right eye daily.    . dorzolamide-timolol (COSOPT) 22.3-6.8 MG/ML ophthalmic solution 1 drop 2 (two) times daily.      Marland Kitchen latanoprost (XALATAN) 0.005 % ophthalmic solution Place 1 drop into both eyes at bedtime.    Marland Kitchen omeprazole (PRILOSEC) 40 MG capsule Take 1 capsule (40 mg total) by mouth daily. 30 capsule 11  . pilocarpine (PILOCAR) 1 % ophthalmic solution Place 1 drop into the left eye 3 (three) times daily.    . sildenafil (VIAGRA) 100 MG tablet Take 0.5-1 tablets (50-100 mg total) by mouth daily as needed for erectile dysfunction. (Patient not taking: Reported on 07/22/2016) 3 tablet 0   No current facility-administered medications on file prior to visit.      Objective:  Objective  Physical Exam  Constitutional: He is oriented to person, place, and time. Vital signs are normal. He appears well-developed and well-nourished. He is sleeping.  HENT:  Head: Normocephalic and atraumatic.  Mouth/Throat: Oropharynx is clear and moist.  Eyes: EOM are normal. Pupils are equal, round, and reactive to light.  Neck: Normal range of motion. Neck supple. No thyromegaly present.  Cardiovascular: Normal rate and regular rhythm.   No murmur heard. Pulmonary/Chest: Effort normal and breath sounds normal. No respiratory distress. He has no wheezes. He  has no rales. He exhibits no tenderness.  Musculoskeletal: He exhibits no edema or tenderness.  Neurological: He is alert and oriented to person, place, and time.  Skin: Skin is warm and dry.  Psychiatric: He has a normal mood and affect. His behavior is normal. Judgment and thought content normal.  Nursing note and vitals reviewed.  BP (!) 170/82  (BP Location: Left Arm, Patient Position: Sitting, Cuff Size: Normal)   Pulse (!) 46   Temp 98 F (36.7 C) (Oral)   Resp 16   Ht 5\' 9"  (1.753 m)   Wt 173 lb (78.5 kg)   SpO2 99%   BMI 25.55 kg/m  Wt Readings from Last 3 Encounters:  07/22/16 173 lb (78.5 kg)  06/28/16 172 lb 12.8 oz (78.4 kg)  04/06/16 170 lb 6.4 oz (77.3 kg)     Lab Results  Component Value Date   WBC 8.7 04/06/2016   HGB 13.4 04/06/2016   HCT 40.2 04/06/2016   PLT 208.0 04/06/2016   GLUCOSE 94 07/22/2016   CHOL 141 07/22/2016   TRIG 98.0 07/22/2016   HDL 43.80 07/22/2016   LDLDIRECT 170.2 10/09/2007   LDLCALC 77 07/22/2016   ALT 10 07/22/2016   AST 15 07/22/2016   NA 142 07/22/2016   K 3.7 07/22/2016   CL 108 07/22/2016   CREATININE 1.19 07/22/2016   BUN 13 07/22/2016   CO2 27 07/22/2016   TSH 1.38 05/19/2010   PSA 31.37 (H) 04/06/2016   HGBA1C 5.1 04/02/2013   MICROALBUR 9.4 (H) 10/06/2015    Dg Cholangiogram Operative  Result Date: 06/30/2010 Clinical Data:   Gallstones.  INTRAOPERATIVE CHOLANGIOGRAM  Comparison: Ultrasound 05/22/2010  Findings: Intraoperative spot images show normal caliber biliary system.  No evidence of retained stone or obstruction.  Free passage of contrast into the small bowel noted.  IMPRESSION: No evidence of retained stone or obstruction.  These images were submitted for radiologic interpretation only. Please see the procedural report for the amount of contrast and the fluoroscopy time utilized. Provider: Charlton Haws    Assessment & Plan:  Plan  I have changed [name] [name]'s cloNIDine. I am also having him maintain his dorzolamide-timolol, aspirin, sildenafil, latanoprost, pilocarpine, dorzolamide, apraclonidine, omeprazole, metoprolol succinate, diltiazem, and atorvastatin.  Meds ordered this encounter  Medications  . metoprolol succinate (TOPROL-XL) 25 MG 24 hr tablet    Sig: Take 1 tablet (25 mg total) by mouth daily.    Dispense:  30 tablet    Refill:  11    . diltiazem (MATZIM LA) 420 MG 24 hr tablet    Sig: Take 1 tablet (420 mg total) by mouth daily.    Dispense:  30 tablet    Refill:  5  . cloNIDine (CATAPRES) 0.1 MG tablet    Sig: Take 1 tablet (0.1 mg total) by mouth 2 (two) times daily.    Dispense:  60 tablet    Refill:  5  . atorvastatin (LIPITOR) 20 MG tablet    Sig: Take 1 tablet (20 mg total) by mouth daily.    Dispense:  30 tablet    Refill:  5    Problem List Items Addressed This Visit      Unprioritized   Essential hypertension   Relevant Medications   metoprolol succinate (TOPROL-XL) 25 MG 24 hr tablet   diltiazem (MATZIM LA) 420 MG 24 hr tablet   cloNIDine (CATAPRES) 0.1 MG tablet   atorvastatin (LIPITOR) 20 MG tablet   Other Relevant Orders   Comprehensive  metabolic panel (Completed)   Lipid panel (Completed)   Hyperlipidemia   Relevant Medications   metoprolol succinate (TOPROL-XL) 25 MG 24 hr tablet   diltiazem (MATZIM LA) 420 MG 24 hr tablet   cloNIDine (CATAPRES) 0.1 MG tablet   atorvastatin (LIPITOR) 20 MG tablet    Other Visit Diagnoses    Hyperlipidemia LDL goal <100    -  Primary   Relevant Medications   metoprolol succinate (TOPROL-XL) 25 MG 24 hr tablet   diltiazem (MATZIM LA) 420 MG 24 hr tablet   cloNIDine (CATAPRES) 0.1 MG tablet   atorvastatin (LIPITOR) 20 MG tablet   Other Relevant Orders   Comprehensive metabolic panel (Completed)   Lipid panel (Completed)      Follow-up: Return in about 6 months (around 01/19/2017) for hypertension, hyperlipidemia.  [name], DO

## 2016-07-22 NOTE — Progress Notes (Signed)
Pre visit review using our clinic review tool, if applicable. No additional management support is needed unless otherwise documented below in the visit note. 

## 2016-07-22 NOTE — Patient Instructions (Signed)

## 2016-07-30 ENCOUNTER — Other Ambulatory Visit: Payer: Self-pay | Admitting: Cardiology

## 2016-07-30 NOTE — Telephone Encounter (Signed)
Rx(s) sent to pharmacy electronically.  

## 2016-08-18 ENCOUNTER — Other Ambulatory Visit: Payer: Self-pay | Admitting: Family Medicine

## 2016-08-18 NOTE — Telephone Encounter (Signed)
Rx has mulriple Cardiac & Myopathy Medication Warnings connected with taking this medication with Atorvastatin, Clonidine, and Diltiazem/SLS 12/06 Forwarded to PCP; Please Advise/SLS 12/06

## 2016-08-18 NOTE — Telephone Encounter (Signed)
Medication Detail   Disp Refills Start End   diltiazem (MATZIM LA) 420 MG 24 hr tablet 30 tablet 5 07/22/2016    Sig - Route: Take 1 tablet (420 mg total) by mouth daily. - Oral   Class: No Print   Associated Diagnoses   Essential hypertension     Pharmacy  [id number] - [place], [place] - [address]

## 2016-10-14 ENCOUNTER — Other Ambulatory Visit: Payer: Self-pay | Admitting: Family Medicine

## 2016-10-15 ENCOUNTER — Ambulatory Visit: Payer: Federal, State, Local not specified - PPO

## 2016-11-25 ENCOUNTER — Other Ambulatory Visit: Payer: Self-pay | Admitting: Family Medicine

## 2017-02-15 ENCOUNTER — Encounter: Payer: Self-pay | Admitting: Family Medicine

## 2017-02-15 ENCOUNTER — Ambulatory Visit (INDEPENDENT_AMBULATORY_CARE_PROVIDER_SITE_OTHER): Payer: Federal, State, Local not specified - PPO | Admitting: Family Medicine

## 2017-02-15 VITALS — BP 146/100 | HR 51 | Temp 98.1°F | Resp 16 | Ht 69.0 in | Wt 167.2 lb

## 2017-02-15 DIAGNOSIS — E785 Hyperlipidemia, unspecified: Secondary | ICD-10-CM | POA: Diagnosis not present

## 2017-02-15 DIAGNOSIS — I10 Essential (primary) hypertension: Secondary | ICD-10-CM | POA: Diagnosis not present

## 2017-02-15 LAB — COMPREHENSIVE METABOLIC PANEL
ALK PHOS: 63 U/L (ref 39–117)
ALT: 12 U/L (ref 0–53)
AST: 18 U/L (ref 0–37)
Albumin: 4.1 g/dL (ref 3.5–5.2)
BILIRUBIN TOTAL: 1.7 mg/dL — AB (ref 0.2–1.2)
BUN: 18 mg/dL (ref 6–23)
CO2: 25 mEq/L (ref 19–32)
Calcium: 9.5 mg/dL (ref 8.4–10.5)
Chloride: 106 mEq/L (ref 96–112)
Creatinine, Ser: 1.38 mg/dL (ref 0.40–1.50)
GFR: 63.27 mL/min (ref >60.00)
GLUCOSE: 100 mg/dL — AB (ref 70–99)
Potassium: 3.7 mEq/L (ref 3.5–5.1)
SODIUM: 139 meq/L (ref 135–145)
TOTAL PROTEIN: 7.8 g/dL (ref 6.0–8.3)

## 2017-02-15 LAB — LIPID PANEL
Cholesterol: 142 mg/dL (ref 0–200)
HDL: 43.8 mg/dL (ref >39.00)
LDL Cholesterol: 72 mg/dL (ref 0–99)
NonHDL: 97.97
Total CHOL/HDL Ratio: 3
Triglycerides: 130 mg/dL (ref 0.0–149.0)
VLDL: 26 mg/dL (ref 0.0–40.0)

## 2017-02-15 MED ORDER — DILTIAZEM HCL ER COATED BEADS 420 MG PO TB24: 420.0000 mg | ORAL_TABLET | Freq: Every day | ORAL | 3 refills | Status: DC

## 2017-02-15 MED ORDER — METOPROLOL SUCCINATE ER 25 MG PO TB24: 25.0000 mg | ORAL_TABLET | Freq: Every day | ORAL | 3 refills | Status: DC

## 2017-02-15 MED ORDER — ATORVASTATIN CALCIUM 20 MG PO TABS: 20.0000 mg | ORAL_TABLET | Freq: Every day | ORAL | 3 refills | Status: DC

## 2017-02-15 MED ORDER — CLONIDINE HCL 0.1 MG PO TABS: 0.1000 mg | ORAL_TABLET | Freq: Two times a day (BID) | ORAL | 3 refills | Status: DC

## 2017-02-15 NOTE — Patient Instructions (Signed)

## 2017-02-15 NOTE — Assessment & Plan Note (Signed)
,   no changes to meds. Encouraged heart healthy diet such as the DASH diet and exercise as tolerated.  Pt did not take meds today He forgot He will take meds and check bp later --- he will let us know if it con't to run high

## 2017-02-15 NOTE — Assessment & Plan Note (Signed)
Tolerating statin, encouraged heart healthy diet, avoid trans fats, minimize simple carbs and saturated fats. Increase exercise as tolerated 

## 2017-02-15 NOTE — Progress Notes (Signed)
Patient ID: [name] [name], male   DOB: 1931/11/20, 84 y.o.   MRN: [id number]     Subjective:  I acted as a Education administrator for Dr. Carollee Herter.  Guerry Bruin, [place]   Patient ID: [name] [name], male    DOB: 1931/09/20, 83 y.o.   MRN: [id number]  Chief Complaint  Patient presents with  . Hypertension  . Hyperlipidemia    HPI  Patient is in today for follow up blood pressure and cholesterol.  He has been running 130s/80s.  He states this morning he did not take medication.  Patient Care Team: Carollee Herter, Alferd Apa, DO as PCP - General Warden Fillers, MD as Consulting Physician (Ophthalmology) Minus Breeding, MD as Consulting Physician (Cardiology) Ladene Artist, MD as Consulting Physician (Gastroenterology) Bond, Tracie Harrier, MD as Referring Physician (Ophthalmology) Fleet Contras, MD as Consulting Physician (Nephrology) Christy Sartorius, MD as Referring Physician (Urology)   Past Medical History:  Diagnosis Date  . Abnormal EKG   . CAD in native artery    Nonobstructive 2005  . Elevated PSA   . Glaucoma   . Hearing impairment   . Hyperlipidemia   . Hypertension   . Prostatic hypertrophy, benign   . PSA elevation   . Renal insufficiency   . Routine general medical examination at a health care facility     Past Surgical History:  Procedure Laterality Date  . BLADDER SURGERY    . CATARACT EXTRACTION    . CHOLECYSTECTOMY  03/2010   [facility] [place] [facility]  . COLONOSCOPY    . removal of cyst     right arm  . SHOULDER SURGERY     cyst removed left shoulder  . TONSILECTOMY, ADENOIDECTOMY, BILATERAL MYRINGOTOMY AND TUBES    . TRANSURETHRAL RESECTION OF PROSTATE      Family History  Problem Relation Age of Onset  . Coronary artery disease Mother   . Hypertension Mother   . Heart disease Mother   . Diabetes Sister   . Heart disease Sister 59       died during cabg  . Hypertension Daughter   . Hypertension Son     Social History   Social History  . Marital  status: Married    Spouse name: N/A  . Number of children: N/A  . Years of education: N/A   Occupational History  . retired Actor    Social History Main Topics  . Smoking status: Former Smoker    Packs/day: 0.30    Years: 20.00    Types: Cigarettes    Quit date: 02/22/1993  . Smokeless tobacco: Never Used  . Alcohol use No  . Drug use: No  . Sexual activity: Yes    Partners: Female   Other Topics Concern  . Not on file   Social History Narrative   Exercise--- stair step,  Sit ups, pull ups and bike    Outpatient Medications Prior to Visit  Medication Sig Dispense Refill  . apraclonidine (IOPIDINE) 0.5 % ophthalmic solution Place 1 drop into the right eye every 12 (twelve) hours.    Marland Kitchen aspirin 81 MG tablet Take 81 mg by mouth as needed.     . cloNIDine (CATAPRES) 0.1 MG tablet Take 1 tablet (0.1 mg total) by mouth 2 (two) times daily. 60 tablet 5  . diltiazem (MATZIM LA) 420 MG 24 hr tablet Take 1 tablet (420 mg total) by mouth daily. 30 tablet 5  . dorzolamide (TRUSOPT) 2 % ophthalmic solution Place 1 drop  into the right eye daily.    . dorzolamide-timolol (COSOPT) 22.3-6.8 MG/ML ophthalmic solution 1 drop 2 (two) times daily.      Marland Kitchen latanoprost (XALATAN) 0.005 % ophthalmic solution Place 1 drop into both eyes at bedtime.    Marland Kitchen omeprazole (PRILOSEC) 40 MG capsule Take 1 capsule (40 mg total) by mouth daily. 30 capsule 11  . pilocarpine (PILOCAR) 1 % ophthalmic solution Place 1 drop into the left eye 3 (three) times daily.    . sildenafil (VIAGRA) 100 MG tablet Take 0.5-1 tablets (50-100 mg total) by mouth daily as needed for erectile dysfunction. 3 tablet 0  . atorvastatin (LIPITOR) 20 MG tablet Take 1 tablet (20 mg total) by mouth daily. 30 tablet 5  . cloNIDine (CATAPRES) 0.1 MG tablet TAKE ONE TABLET BY MOUTH TWICE DAILY 60 tablet 5  . MATZIM LA 420 MG 24 hr tablet TAKE ONE TABLET BY MOUTH ONCE DAILY 90 tablet 0  . metoprolol succinate (TOPROL-XL) 25 MG 24 hr tablet  Take 1 tablet (25 mg total) by mouth daily. 90 tablet 3   No facility-administered medications prior to visit.     No Known Allergies  Review of Systems  Constitutional: Negative for fever and malaise/fatigue.  HENT: Negative for congestion.   Eyes: Negative for blurred vision.  Respiratory: Negative for cough and shortness of breath.   Cardiovascular: Negative for chest pain, palpitations and leg swelling.  Gastrointestinal: Negative for vomiting.  Musculoskeletal: Negative for back pain.  Skin: Negative for rash.  Neurological: Negative for loss of consciousness and headaches.       Objective:    Physical Exam  Constitutional: He is oriented to person, place, and time. Vital signs are normal. He appears well-developed and well-nourished. He is sleeping. No distress.  HENT:  Head: Normocephalic and atraumatic.  Mouth/Throat: Oropharynx is clear and moist.  Eyes: Conjunctivae and EOM are normal. Pupils are equal, round, and reactive to light.  Neck: Normal range of motion. Neck supple. No thyromegaly present.  Cardiovascular: Normal rate and regular rhythm.   No murmur heard. Pulmonary/Chest: Effort normal and breath sounds normal. No respiratory distress. He has no wheezes. He has no rales. He exhibits no tenderness.  Abdominal: Soft. Bowel sounds are normal. There is no tenderness.  Musculoskeletal: Normal range of motion. He exhibits no edema, tenderness or deformity.  Neurological: He is alert and oriented to person, place, and time.  Skin: Skin is warm and dry. He is not diaphoretic.  Psychiatric: He has a normal mood and affect. His behavior is normal. Judgment and thought content normal.  Nursing note and vitals reviewed.   BP (!) 146/100   Pulse (!) 51   Temp 98.1 F (36.7 C) (Oral)   Resp 16   Ht 5\' 9"  (1.753 m)   Wt 167 lb 3.2 oz (75.8 kg)   SpO2 98%   BMI 24.69 kg/m  Wt Readings from Last 3 Encounters:  02/15/17 167 lb 3.2 oz (75.8 kg)  07/22/16 173 lb  (78.5 kg)  06/28/16 172 lb 12.8 oz (78.4 kg)   BP Readings from Last 3 Encounters:  02/15/17 (!) 146/100  07/22/16 (!) 170/82  06/28/16 (!) 142/78     Immunization History  Administered Date(s) Administered  . Influenza Whole 08/03/2007    Health Maintenance  Topic Date Due  . TETANUS/TDAP  02/23/2015  . COLONOSCOPY  07/22/2017 (Originally 04/05/2016)  . PNA vac Low Risk Adult (1 of 2 - PCV13) 07/22/2017 (Originally 01/12/1999)  . INFLUENZA  VACCINE  04/13/2017    Lab Results  Component Value Date   WBC 8.7 04/06/2016   HGB 13.4 04/06/2016   HCT 40.2 04/06/2016   PLT 208.0 04/06/2016   GLUCOSE 100 (H) 02/15/2017   CHOL 142 02/15/2017   TRIG 130.0 02/15/2017   HDL 43.80 02/15/2017   LDLDIRECT 170.2 10/09/2007   LDLCALC 72 02/15/2017   ALT 12 02/15/2017   AST 18 02/15/2017   NA 139 02/15/2017   K 3.7 02/15/2017   CL 106 02/15/2017   CREATININE 1.38 02/15/2017   BUN 18 02/15/2017   CO2 25 02/15/2017   TSH 1.38 05/19/2010   PSA 31.37 (H) 04/06/2016   HGBA1C 5.1 04/02/2013   MICROALBUR 9.4 (H) 10/06/2015    Lab Results  Component Value Date   TSH 1.38 05/19/2010   Lab Results  Component Value Date   WBC 8.7 04/06/2016   HGB 13.4 04/06/2016   HCT 40.2 04/06/2016   MCV 90.9 04/06/2016   PLT 208.0 04/06/2016   Lab Results  Component Value Date   NA 139 02/15/2017   K 3.7 02/15/2017   CO2 25 02/15/2017   GLUCOSE 100 (H) 02/15/2017   BUN 18 02/15/2017   CREATININE 1.38 02/15/2017   BILITOT 1.7 (H) 02/15/2017   ALKPHOS 63 02/15/2017   AST 18 02/15/2017   ALT 12 02/15/2017   PROT 7.8 02/15/2017   ALBUMIN 4.1 02/15/2017   CALCIUM 9.5 02/15/2017   GFR 63.27 02/15/2017   Lab Results  Component Value Date   CHOL 142 02/15/2017   Lab Results  Component Value Date   HDL 43.80 02/15/2017   Lab Results  Component Value Date   LDLCALC 72 02/15/2017   Lab Results  Component Value Date   TRIG 130.0 02/15/2017   Lab Results  Component Value Date    CHOLHDL 3 02/15/2017   Lab Results  Component Value Date   HGBA1C 5.1 04/02/2013         Assessment & Plan:   Problem List Items Addressed This Visit      Unprioritized   Essential hypertension    , no changes to meds. Encouraged heart healthy diet such as the DASH diet and exercise as tolerated.  Pt did not take meds today He forgot He will take meds and check bp later --- he will let us know if it con't to run high      Relevant Medications   cloNIDine (CATAPRES) 0.1 MG tablet   diltiazem (MATZIM LA) 420 MG 24 hr tablet   metoprolol succinate (TOPROL-XL) 25 MG 24 hr tablet   atorvastatin (LIPITOR) 20 MG tablet   Other Relevant Orders   Comprehensive metabolic panel (Completed)   Lipid panel (Completed)   Hyperlipidemia - Primary    Tolerating statin, encouraged heart healthy diet, avoid trans fats, minimize simple carbs and saturated fats. Increase exercise as tolerated      Relevant Medications   cloNIDine (CATAPRES) 0.1 MG tablet   diltiazem (MATZIM LA) 420 MG 24 hr tablet   metoprolol succinate (TOPROL-XL) 25 MG 24 hr tablet   atorvastatin (LIPITOR) 20 MG tablet      I have changed Mr. Lightner MATZIM LA to diltiazem. I have also changed his cloNIDine. I am also having him maintain his dorzolamide-timolol, aspirin, sildenafil, latanoprost, pilocarpine, dorzolamide, apraclonidine, omeprazole, diltiazem, cloNIDine, metoprolol succinate, and atorvastatin.  Meds ordered this encounter  Medications  . cloNIDine (CATAPRES) 0.1 MG tablet    Sig: Take 1 tablet (0.1 mg total)  by mouth 2 (two) times daily.    Dispense:  180 tablet    Refill:  3    Please consider 90 day supplies to promote better adherence  . diltiazem (MATZIM LA) 420 MG 24 hr tablet    Sig: Take 1 tablet (420 mg total) by mouth daily.    Dispense:  90 tablet    Refill:  3  . metoprolol succinate (TOPROL-XL) 25 MG 24 hr tablet    Sig: Take 1 tablet (25 mg total) by mouth daily.    Dispense:  90  tablet    Refill:  3  . atorvastatin (LIPITOR) 20 MG tablet    Sig: Take 1 tablet (20 mg total) by mouth daily.    Dispense:  90 tablet    Refill:  3    CMA served as scribe during this visit. History, Physical and Plan performed by medical provider. Documentation and orders reviewed and attested to.  [name], DO

## 2017-06-15 NOTE — Progress Notes (Signed)
HPI The patient presents for follow up of a mildly reduced ejection fraction related to hypertension. He did have a cath 2005 with nonobstructive coronary disease.   I did an echo in 2014 and the EF was well preserved.  I have been following him for difficult to control hypertension.  Since I last saw him he has done well.  His BP has been well controlled at home.  The patient denies any new symptoms such as chest discomfort, neck or arm discomfort. There has been no new shortness of breath, PND or orthopnea. There have been no reported palpitations, presyncope or syncope.   No Known Allergies  Current Outpatient Prescriptions  Medication Sig Dispense Refill  . aspirin 81 MG tablet Take 81 mg by mouth as needed.     . cloNIDine (CATAPRES) 0.1 MG tablet Take 1 tablet (0.1 mg total) by mouth 2 (two) times daily. 60 tablet 5  . diltiazem (MATZIM LA) 420 MG 24 hr tablet Take 1 tablet (420 mg total) by mouth daily. 30 tablet 5  . dorzolamide-timolol (COSOPT) 22.3-6.8 MG/ML ophthalmic solution 1 drop 2 (two) times daily.      Marland Kitchen latanoprost (XALATAN) 0.005 % ophthalmic solution Place 1 drop into both eyes at bedtime.    . metoprolol succinate (TOPROL-XL) 25 MG 24 hr tablet Take 1 tablet (25 mg total) by mouth daily. 90 tablet 3  . omeprazole (PRILOSEC) 40 MG capsule Take 1 capsule (40 mg total) by mouth daily. 30 capsule 11  . pilocarpine (PILOCAR) 1 % ophthalmic solution Place 1 drop into the left eye 3 (three) times daily.    . rosuvastatin (CRESTOR) 20 MG tablet Take 20 mg by mouth daily.    . sildenafil (VIAGRA) 100 MG tablet Take 0.5-1 tablets (50-100 mg total) by mouth daily as needed for erectile dysfunction. 3 tablet 0   No current facility-administered medications for this visit.     Past Medical History:  Diagnosis Date  . Abnormal EKG   . CAD in native artery    Nonobstructive 2005  . Elevated PSA   . Glaucoma   . Hearing impairment   . Hyperlipidemia   . Hypertension   .  Prostatic hypertrophy, benign   . PSA elevation   . Renal insufficiency   . Routine general medical examination at a health care facility     Past Surgical History:  Procedure Laterality Date  . BLADDER SURGERY    . CATARACT EXTRACTION    . CHOLECYSTECTOMY  03/2010   [facility] [place] [facility]  . COLONOSCOPY    . removal of cyst     right arm  . SHOULDER SURGERY     cyst removed left shoulder  . TONSILECTOMY, ADENOIDECTOMY, BILATERAL MYRINGOTOMY AND TUBES    . TRANSURETHRAL RESECTION OF PROSTATE      ROS:  As stated in the HPI and negative for all other systems.  PHYSICAL EXAM BP 122/78   Pulse (!) 49   Ht 5\' 8"  (1.727 m)   Wt 161 lb 9.6 oz (73.3 kg)   BMI 24.57 kg/m   GENERAL:  Well appearing NECK:  No jugular venous distention, waveform within normal limits, carotid upstroke brisk and symmetric, no bruits, no thyromegaly LUNGS:  Clear to auscultation bilaterally CHEST:  Unremarkable HEART:  PMI not displaced or sustained,S1 and S2 within normal limits, no S3, no S4, no clicks, no rubs, no murmurs ABD:  Flat, positive bowel sounds normal in frequency in pitch, no bruits, no rebound,  no guarding, no midline pulsatile mass, no hepatomegaly, no splenomegaly EXT:  2 plus pulses throughout, no edema, no cyanosis no clubbing   EKG:  Sinus bradycardia, rate 49, axis within normal limits, intervals with prolonged QT, deep T-wave inversions unchanged from previous. 06/17/2017  ASSESSMENT AND PLAN  HTN:  His blood pressure is well controlled.  No change in meds is planned.   CAD:  This nonobstructive disease .  No further testing is suggested.  He has no active symptoms.   CARDIOMYOPATHY:  His EF was normal in 2014.  I would not suspect that this was reduced.  No change in therapy.   DYSLIPIDEMIA:  His most recent LDL was 63. He will remain on meds as listed.   ABNORMAL EKG:  This is unchanged.   He has a markedly abnormal EKG.  This however, is repolarization and unchanged.  I  previously gave him a copy of the EKG.   He has a long QT and should avoid QT prolonging drugs but he has had no QT syndrome.

## 2017-06-16 ENCOUNTER — Encounter: Payer: Self-pay | Admitting: Cardiology

## 2017-06-16 ENCOUNTER — Ambulatory Visit (INDEPENDENT_AMBULATORY_CARE_PROVIDER_SITE_OTHER): Payer: Federal, State, Local not specified - PPO | Admitting: Cardiology

## 2017-06-16 VITALS — BP 122/78 | HR 49 | Ht 68.0 in | Wt 161.6 lb

## 2017-06-16 DIAGNOSIS — R9431 Abnormal electrocardiogram [ECG] [EKG]: Secondary | ICD-10-CM | POA: Diagnosis not present

## 2017-06-16 DIAGNOSIS — I2510 Atherosclerotic heart disease of native coronary artery without angina pectoris: Secondary | ICD-10-CM | POA: Diagnosis not present

## 2017-06-16 DIAGNOSIS — E785 Hyperlipidemia, unspecified: Secondary | ICD-10-CM

## 2017-06-16 DIAGNOSIS — I10 Essential (primary) hypertension: Secondary | ICD-10-CM

## 2017-06-16 NOTE — Patient Instructions (Signed)
Medication Instructions:  Continue current medications  If you need a refill on your cardiac medications before your next appointment, please call your pharmacy.  Labwork: None Ordered   Testing/Procedures: None Ordered   Follow-Up: Your physician wants you to follow-up in: 18 Months. You should receive a reminder letter in the mail two months in advance. If you do not receive a letter, please call our office [phone].     Thank you for choosing [facility]!!      

## 2017-06-17 ENCOUNTER — Encounter: Payer: Self-pay | Admitting: Cardiology

## 2017-07-06 ENCOUNTER — Ambulatory Visit: Payer: Federal, State, Local not specified - PPO | Admitting: Cardiology

## 2017-08-16 NOTE — Progress Notes (Signed)
Subjective:   [name] [name] is a 83 y.o. male who presents for Medicare Annual/Subsequent preventive examination.  Review of Systems:  Physical assessment deferred to PCP.  Cardiac Risk Factors include: advanced age (>15men, >28 women);dyslipidemia;hypertension;male gender Sleep patterns: Sleeps about 7-8 hrs per night. Home Safety/Smoke Alarms: Feels safe in home. Smoke alarms in place. No steps in the home. Lives with wife.   Male:   CCS-  No longer doing routine screening due to age.    PSA-  Lab Results  Component Value Date   PSA 31.37 (H) 04/06/2016   PSA 25.86 (H) 04/01/2015   PSA 16.51 (H) 04/02/2013       Objective:    Vitals: BP 127/66 (BP Location: Left Arm, Cuff Size: Normal)   Pulse (!) 58   Temp 97.6 F (36.4 C) (Oral)   Resp 16   Ht 5\' 9"  (1.753 m)   Wt 165 lb 12.8 oz (75.2 kg)   SpO2 98%   BMI 24.48 kg/m   Body mass index is 24.48 kg/m.  Advanced Directives 08/18/2017 04/06/2016  Does Patient Have a Medical Advance Directive? Yes No  Type of Paramedic of [place];Living will -  Does patient want to make changes to medical advance directive? No - Patient declined -  Copy of [place] in Chart? No - copy requested -  Would patient like information on creating a medical advance directive? - Yes - Scientist, clinical (histocompatibility and immunogenetics) given    Tobacco Social History   Tobacco Use  Smoking Status Former Smoker  . Packs/day: 0.30  . Years: 20.00  . Pack years: 6.00  . Types: Cigarettes  . Last attempt to quit: 02/22/1993  . Years since quitting: 24.5  Smokeless Tobacco Never Used     Counseling given: Not Answered   Clinical Intake:  Pain : No/denies pain    Past Medical History:  Diagnosis Date  . Abnormal EKG   . CAD in native artery    Nonobstructive 2005  . Elevated PSA   . Glaucoma   . Hearing impairment   . Hyperlipidemia   . Hypertension   . Prostatic hypertrophy, benign   . PSA elevation   .  Renal insufficiency   . Routine general medical examination at a health care facility    Past Surgical History:  Procedure Laterality Date  . BLADDER SURGERY    . CATARACT EXTRACTION    . CHOLECYSTECTOMY  03/2010   [facility] [place] [facility]  . COLONOSCOPY    . removal of cyst     right arm  . SHOULDER SURGERY     cyst removed left shoulder  . TONSILECTOMY, ADENOIDECTOMY, BILATERAL MYRINGOTOMY AND TUBES    . TRANSURETHRAL RESECTION OF PROSTATE     Family History  Problem Relation Age of Onset  . Coronary artery disease Mother   . Hypertension Mother   . Heart disease Mother   . Diabetes Sister   . Heart disease Sister 31       died during cabg  . Hypertension Daughter   . Hypertension Son    Social History   Socioeconomic History  . Marital status: Married    Spouse name: None  . Number of children: None  . Years of education: None  . Highest education level: None  Social Needs  . Financial resource strain: None  . Food insecurity - worry: None  . Food insecurity - inability: None  . Transportation needs - medical: None  .  Transportation needs - non-medical: None  Occupational History  . Occupation: retired Actor  Tobacco Use  . Smoking status: Former Smoker    Packs/day: 0.30    Years: 20.00    Pack years: 6.00    Types: Cigarettes    Last attempt to quit: 02/22/1993    Years since quitting: 24.5  . Smokeless tobacco: Never Used  Substance and Sexual Activity  . Alcohol use: No  . Drug use: No  . Sexual activity: Yes    Partners: Female  Other Topics Concern  . None  Social History Narrative   Exercise--- stair step,  Sit ups, pull ups and bike    Outpatient Encounter Medications as of 08/18/2017  Medication Sig  . atorvastatin (LIPITOR) 20 MG tablet Take 20 mg by mouth at bedtime.  . cloNIDine (CATAPRES) 0.1 MG tablet Take 1 tablet (0.1 mg total) by mouth 2 (two) times daily.  Marland Kitchen diltiazem (MATZIM LA) 420 MG 24 hr tablet Take 1 tablet (420 mg  total) by mouth daily.  . dorzolamide-timolol (COSOPT) 22.3-6.8 MG/ML ophthalmic solution 1 drop 2 (two) times daily.    Marland Kitchen latanoprost (XALATAN) 0.005 % ophthalmic solution Place 1 drop into both eyes at bedtime.  . metoprolol succinate (TOPROL-XL) 25 MG 24 hr tablet Take 1 tablet (25 mg total) by mouth daily.  . pilocarpine (PILOCAR) 1 % ophthalmic solution Place 1 drop into the left eye 3 (three) times daily.  . [DISCONTINUED] aspirin 81 MG tablet Take 81 mg by mouth as needed.   . [DISCONTINUED] omeprazole (PRILOSEC) 40 MG capsule Take 1 capsule (40 mg total) by mouth daily.  . [DISCONTINUED] rosuvastatin (CRESTOR) 20 MG tablet Take 20 mg by mouth daily.  . [DISCONTINUED] sildenafil (VIAGRA) 100 MG tablet Take 0.5-1 tablets (50-100 mg total) by mouth daily as needed for erectile dysfunction.   No facility-administered encounter medications on file as of 08/18/2017.     Activities of Daily Living In your present state of health, do you have any difficulty performing the following activities: 08/18/2017  Hearing? Y  Comment right ear. MD aware  Vision? Y  Comment Wearing glasses. Follows every 3 months for glaucoma.  Difficulty concentrating or making decisions? N  Walking or climbing stairs? N  Dressing or bathing? N  Doing errands, shopping? N  Preparing Food and eating ? N  Using the Toilet? N  In the past six months, have you accidently leaked urine? N  Do you have problems with loss of bowel control? N  Managing your Medications? N  Managing your Finances? N  Housekeeping or managing your Housekeeping? N  Some recent data might be hidden     Patient Care Team: Carollee Herter, Alferd Apa, DO as PCP - General Warden Fillers, MD as Consulting Physician (Ophthalmology) Minus Breeding, MD as Consulting Physician (Cardiology) Ladene Artist, MD as Consulting Physician (Gastroenterology) Bond, Tracie Harrier, MD as Referring Physician (Ophthalmology) Fleet Contras, MD as  Consulting Physician (Nephrology) Christy Sartorius, MD as Referring Physician (Urology)   Assessment:    Physical assessment deferred to PCP. Exercise Activities and Dietary recommendations Current Exercise Habits: Home exercise routine, Time (Minutes): 30, Frequency (Times/Week): 3, Weekly Exercise (Minutes/Week): 90, Intensity: Mild   Diet (meal preparation, eat out, water intake, caffeinated beverages, dairy products, fruits and vegetables): 24 Hour Recall  Breakfast: cereal  Lunch: shrimp and fried rice Dinner: shrimp and fried rice   Goals    . Maintain current health and independece  Fall Risk Fall Risk  04/06/2016 10/06/2015 04/01/2015 10/01/2014 04/02/2013  Falls in the past year? No No No No No    Depression Screen PHQ 2/9 Scores 08/18/2017 07/22/2016 04/06/2016 10/06/2015  PHQ - 2 Score 0 0 0 0    Cognitive Function MMSE - Mini Mental State Exam 08/18/2017 04/06/2016  Orientation to time 5 5  Orientation to Place 5 5  Registration 3 3  Attention/ Calculation 5 5  Recall 3 3  Language- name 2 objects 2 2  Language- repeat 1 1  Language- follow 3 step command 3 3  Language- read & follow direction 1 1  Write a sentence 1 1  Copy design 1 1  Total score 30 30        Immunization History  Administered Date(s) Administered  . Influenza Whole 08/03/2007   Screening Tests Health Maintenance  Topic Date Due  . TETANUS/TDAP  02/23/2015  . INFLUENZA VACCINE  04/20/2018 (Originally 04/13/2017)  . PNA vac Low Risk Adult (1 of 2 - PCV13) 08/18/2018 (Originally 01/12/1999)       Plan:   Follow up with PCP as directed  Continue to eat heart healthy diet (full of fruits, vegetables, whole grains, lean protein, water--limit salt, fat, and sugar intake) and increase physical activity as tolerated.  Continue doing brain stimulating activities (puzzles, reading, adult coloring books, staying active) to keep memory sharp.   I have personally reviewed and noted the  following in the patient's chart:   . Medical and social history . Use of alcohol, tobacco or illicit drugs  . Current medications and supplements . Functional ability and status . Nutritional status . Physical activity . Advanced directives . List of other physicians . Hospitalizations, surgeries, and ER visits in previous 12 months . Vitals . Screenings to include cognitive, depression, and falls . Referrals and appointments  In addition, I have reviewed and discussed with patient certain preventive protocols, quality metrics, and best practice recommendations. A written personalized care plan for preventive services as well as general preventive health recommendations were provided to patient.     Shela Nevin, [place]  08/18/2017

## 2017-08-18 ENCOUNTER — Encounter: Payer: Self-pay | Admitting: Family Medicine

## 2017-08-18 ENCOUNTER — Ambulatory Visit: Payer: Federal, State, Local not specified - PPO | Admitting: *Deleted

## 2017-08-18 ENCOUNTER — Ambulatory Visit: Payer: Federal, State, Local not specified - PPO | Admitting: Family Medicine

## 2017-08-18 ENCOUNTER — Other Ambulatory Visit: Payer: Self-pay

## 2017-08-18 VITALS — BP 127/66 | HR 58 | Temp 97.6°F | Resp 16 | Ht 69.0 in | Wt 165.8 lb

## 2017-08-18 DIAGNOSIS — I10 Essential (primary) hypertension: Secondary | ICD-10-CM | POA: Diagnosis not present

## 2017-08-18 DIAGNOSIS — E785 Hyperlipidemia, unspecified: Secondary | ICD-10-CM | POA: Diagnosis not present

## 2017-08-18 DIAGNOSIS — Z0000 Encounter for general adult medical examination without abnormal findings: Secondary | ICD-10-CM

## 2017-08-18 LAB — COMPREHENSIVE METABOLIC PANEL
ALBUMIN: 3.7 g/dL (ref 3.5–5.2)
ALK PHOS: 65 U/L (ref 39–117)
ALT: 6 U/L (ref 0–53)
AST: 15 U/L (ref 0–37)
BUN: 10 mg/dL (ref 6–23)
CO2: 27 mEq/L (ref 19–32)
CREATININE: 1.12 mg/dL (ref 0.40–1.50)
Calcium: 8.8 mg/dL (ref 8.4–10.5)
Chloride: 107 mEq/L (ref 96–112)
GFR: 80.41 mL/min (ref >60.00)
Glucose, Bld: 95 mg/dL (ref 70–99)
POTASSIUM: 3.4 meq/L — AB (ref 3.5–5.1)
Sodium: 141 mEq/L (ref 135–145)
Total Bilirubin: 1.4 mg/dL — ABNORMAL HIGH (ref 0.2–1.2)
Total Protein: 6.7 g/dL (ref 6.0–8.3)

## 2017-08-18 LAB — LIPID PANEL
CHOLESTEROL: 118 mg/dL (ref 0–200)
HDL: 39.7 mg/dL (ref >39.00)
LDL Cholesterol: 62 mg/dL (ref 0–99)
NonHDL: 78.46
Total CHOL/HDL Ratio: 3
Triglycerides: 82 mg/dL (ref 0.0–149.0)
VLDL: 16.4 mg/dL (ref 0.0–40.0)

## 2017-08-18 NOTE — Patient Instructions (Addendum)

## 2018-03-21 ENCOUNTER — Ambulatory Visit (INDEPENDENT_AMBULATORY_CARE_PROVIDER_SITE_OTHER): Payer: Federal, State, Local not specified - PPO | Admitting: Family Medicine

## 2018-03-21 ENCOUNTER — Encounter: Payer: Self-pay | Admitting: Family Medicine

## 2018-03-21 VITALS — BP 110/60 | HR 103 | Temp 97.6°F | Resp 16 | Ht 69.0 in | Wt 154.4 lb

## 2018-03-21 DIAGNOSIS — N400 Enlarged prostate without lower urinary tract symptoms: Secondary | ICD-10-CM

## 2018-03-21 DIAGNOSIS — E785 Hyperlipidemia, unspecified: Secondary | ICD-10-CM | POA: Diagnosis not present

## 2018-03-21 DIAGNOSIS — I10 Essential (primary) hypertension: Secondary | ICD-10-CM | POA: Diagnosis not present

## 2018-03-21 DIAGNOSIS — Z0000 Encounter for general adult medical examination without abnormal findings: Secondary | ICD-10-CM | POA: Diagnosis not present

## 2018-03-21 LAB — CBC WITH DIFFERENTIAL/PLATELET
BASOS PCT: 0.9 % (ref 0.0–3.0)
Basophils Absolute: 0.1 10*3/uL (ref 0.0–0.1)
EOS PCT: 1.9 % (ref 0.0–5.0)
Eosinophils Absolute: 0.1 10*3/uL (ref 0.0–0.7)
HEMATOCRIT: 36.7 % — AB (ref 39.0–52.0)
HEMOGLOBIN: 12.5 g/dL — AB (ref 13.0–17.0)
LYMPHS PCT: 20.8 % (ref 12.0–46.0)
Lymphs Abs: 1.6 10*3/uL (ref 0.7–4.0)
MCHC: 34.2 g/dL (ref 30.0–36.0)
MCV: 91.3 fl (ref 78.0–100.0)
Monocytes Absolute: 0.6 10*3/uL (ref 0.1–1.0)
Monocytes Relative: 7.2 % (ref 3.0–12.0)
Neutro Abs: 5.3 10*3/uL (ref 1.4–7.7)
Neutrophils Relative %: 69.2 % (ref 43.0–77.0)
Platelets: 209 10*3/uL (ref 150.0–400.0)
RBC: 4.02 Mil/uL — ABNORMAL LOW (ref 4.22–5.81)
RDW: 13.3 % (ref 11.5–15.5)
WBC: 7.7 10*3/uL (ref 4.0–10.5)

## 2018-03-21 LAB — COMPREHENSIVE METABOLIC PANEL
ALBUMIN: 3.5 g/dL (ref 3.5–5.2)
ALK PHOS: 48 U/L (ref 39–117)
ALT: 11 U/L (ref 0–53)
AST: 17 U/L (ref 0–37)
BILIRUBIN TOTAL: 1.3 mg/dL — AB (ref 0.2–1.2)
BUN: 14 mg/dL (ref 6–23)
CALCIUM: 9 mg/dL (ref 8.4–10.5)
CO2: 31 mEq/L (ref 19–32)
CREATININE: 1.45 mg/dL (ref 0.40–1.50)
Chloride: 105 mEq/L (ref 96–112)
GFR: 59.6 mL/min — ABNORMAL LOW (ref >60.00)
Glucose, Bld: 99 mg/dL (ref 70–99)
Potassium: 4.3 mEq/L (ref 3.5–5.1)
Sodium: 140 mEq/L (ref 135–145)
Total Protein: 6.6 g/dL (ref 6.0–8.3)

## 2018-03-21 LAB — LIPID PANEL
CHOLESTEROL: 146 mg/dL (ref 0–200)
HDL: 39.2 mg/dL (ref >39.00)
LDL Cholesterol: 75 mg/dL (ref 0–99)
NonHDL: 106.75
TRIGLYCERIDES: 160 mg/dL — AB (ref 0.0–149.0)
Total CHOL/HDL Ratio: 4
VLDL: 32 mg/dL (ref 0.0–40.0)

## 2018-03-21 NOTE — Patient Instructions (Signed)
Preventive Care 82 Years and Older, Male Preventive care refers to lifestyle choices and visits with your health care provider that can promote health and wellness. What does preventive care include?  A yearly physical exam. This is also called an annual well check.  Dental exams once or twice a year.  Routine eye exams. Ask your health care provider how often you should have your eyes checked.  Personal lifestyle choices, including: ? Daily care of your teeth and gums. ? Regular physical activity. ? Eating a healthy diet. ? Avoiding tobacco and drug use. ? Limiting alcohol use. ? Practicing safe sex. ? Taking low doses of aspirin every day. ? Taking vitamin and mineral supplements as recommended by your health care provider. What happens during an annual well check? The services and screenings done by your health care provider during your annual well check will depend on your age, overall health, lifestyle risk factors, and family history of disease. Counseling Your health care provider may ask you questions about your:  Alcohol use.  Tobacco use.  Drug use.  Emotional well-being.  Home and relationship well-being.  Sexual activity.  Eating habits.  History of falls.  Memory and ability to understand (cognition).  Work and work environment.  Screening You may have the following tests or measurements:  Height, weight, and BMI.  Blood pressure.  Lipid and cholesterol levels. These may be checked every 5 years, or more frequently if you are over 50 years old.  Skin check.  Lung cancer screening. You may have this screening every year starting at age 55 if you have a 30-pack-year history of smoking and currently smoke or have quit within the past 15 years.  Fecal occult blood test (FOBT) of the stool. You may have this test every year starting at age 50.  Flexible sigmoidoscopy or colonoscopy. You may have a sigmoidoscopy every 5 years or a colonoscopy every 10  years starting at age 50.  Prostate cancer screening. Recommendations will vary depending on your family history and other risks.  Hepatitis C blood test.  Hepatitis B blood test.  Sexually transmitted disease (STD) testing.  Diabetes screening. This is done by checking your blood sugar (glucose) after you have not eaten for a while (fasting). You may have this done every 1-3 years.  Abdominal aortic aneurysm (AAA) screening. You may need this if you are a current or former smoker.  Osteoporosis. You may be screened starting at age 70 if you are at high risk.  Talk with your health care provider about your test results, treatment options, and if necessary, the need for more tests. Vaccines Your health care provider may recommend certain vaccines, such as:  Influenza vaccine. This is recommended every year.  Tetanus, diphtheria, and acellular pertussis (Tdap, Td) vaccine. You may need a Td booster every 10 years.  Varicella vaccine. You may need this if you have not been vaccinated.  Zoster vaccine. You may need this after age 60.  Measles, mumps, and rubella (MMR) vaccine. You may need at least one dose of MMR if you were born in 1957 or later. You may also need a second dose.  Pneumococcal 13-valent conjugate (PCV13) vaccine. One dose is recommended after age 65.  Pneumococcal polysaccharide (PPSV23) vaccine. One dose is recommended after age 65.  Meningococcal vaccine. You may need this if you have certain conditions.  Hepatitis A vaccine. You may need this if you have certain conditions or if you travel or work in places where you   may be exposed to hepatitis A.  Hepatitis B vaccine. You may need this if you have certain conditions or if you travel or work in places where you may be exposed to hepatitis B.  Haemophilus influenzae type b (Hib) vaccine. You may need this if you have certain risk factors.  Talk to your health care provider about which screenings and vaccines  you need and how often you need them. This information is not intended to replace advice given to you by your health care provider. Make sure you discuss any questions you have with your health care provider. Document Released: 09/26/2015 Document Revised: 05/19/2016 Document Reviewed: 07/01/2015 Elsevier Interactive Patient Education  2018 [employer] Inc[employer]  

## 2018-03-21 NOTE — Assessment & Plan Note (Signed)
Tolerating statin, encouraged heart healthy diet, avoid trans fats, minimize simple carbs and saturated fats. Increase exercise as tolerated 

## 2018-03-21 NOTE — Assessment & Plan Note (Signed)
Well controlled, no changes to meds. Encouraged heart healthy diet such as the DASH diet and exercise as tolerated.  °

## 2018-03-21 NOTE — Progress Notes (Signed)
Patient ID: [name] [name], male    DOB: 1931-12-30  Age: 84 y.o. MRN: [id number]    Subjective:  Subjective  HPI [name] [name] presents for cpe and f/u bp , cholesterol.  He sees the urologist for his prostate exam.  He c/o skin tag on his R shoulder but will see the dermatologist for this   Review of Systems  Constitutional: Negative for chills and fever.  HENT: Negative for congestion and hearing loss.   Eyes: Negative for discharge.  Respiratory: Negative for cough and shortness of breath.   Cardiovascular: Negative for chest pain, palpitations and leg swelling.  Gastrointestinal: Negative for abdominal pain, blood in stool, constipation, diarrhea, nausea and vomiting.  Genitourinary: Negative for dysuria, frequency, hematuria and urgency.  Musculoskeletal: Negative for back pain and myalgias.  Skin: Negative for rash.  Allergic/Immunologic: Negative for environmental allergies.  Neurological: Negative for dizziness, weakness and headaches.  Hematological: Does not bruise/bleed easily.  Psychiatric/Behavioral: Negative for suicidal ideas. The patient is not nervous/anxious.     History Past Medical History:  Diagnosis Date  . Abnormal EKG   . CAD in native artery    Nonobstructive 2005  . Elevated PSA   . Glaucoma   . Hearing impairment   . Hyperlipidemia   . Hypertension   . Prostatic hypertrophy, benign   . PSA elevation   . Renal insufficiency   . Routine general medical examination at a health care facility     He has a past surgical history that includes Transurethral resection of prostate; Cholecystectomy (03/2010); Colonoscopy; Tonsilectomy, adenoidectomy, bilateral myringotomy and tubes; removal of cyst; Shoulder surgery; Bladder surgery; and Cataract extraction.   His family history includes Coronary artery disease in his mother; Diabetes in his sister; Heart disease in his mother; Heart disease (age of onset: 20) in his sister; Hypertension in his daughter,  mother, and son.He reports that he quit smoking about 25 years ago. His smoking use included cigarettes. He has a 6.00 pack-year smoking history. He has never used smokeless tobacco. He reports that he does not drink alcohol or use drugs.  Current Outpatient Medications on File Prior to Visit  Medication Sig Dispense Refill  . atorvastatin (LIPITOR) 20 MG tablet Take 20 mg by mouth at bedtime.    . cloNIDine (CATAPRES) 0.1 MG tablet Take 1 tablet (0.1 mg total) by mouth 2 (two) times daily. 60 tablet 5  . diltiazem (MATZIM LA) 420 MG 24 hr tablet Take 1 tablet (420 mg total) by mouth daily. 30 tablet 5  . dorzolamide-timolol (COSOPT) 22.3-6.8 MG/ML ophthalmic solution 1 drop 2 (two) times daily.      Marland Kitchen latanoprost (XALATAN) 0.005 % ophthalmic solution Place 1 drop into both eyes at bedtime.    . metoprolol succinate (TOPROL-XL) 25 MG 24 hr tablet Take 1 tablet (25 mg total) by mouth daily. 90 tablet 3  . pilocarpine (PILOCAR) 1 % ophthalmic solution Place 1 drop into the left eye 3 (three) times daily.     No current facility-administered medications on file prior to visit.      Objective:  Objective  Physical Exam  Constitutional: He is oriented to person, place, and time. He appears well-developed and well-nourished. No distress.  HENT:  Head: Normocephalic and atraumatic.  Right Ear: External ear normal.  Left Ear: External ear normal.  Nose: Nose normal.  Mouth/Throat: Oropharynx is clear and moist. No oropharyngeal exudate.  Eyes: Pupils are equal, round, and reactive to light. Conjunctivae and EOM are  normal. Right eye exhibits no discharge. Left eye exhibits no discharge.  Neck: Normal range of motion. Neck supple. No JVD present. No thyromegaly present.  Cardiovascular: Normal rate, regular rhythm and intact distal pulses.  No murmur heard. Pulmonary/Chest: Effort normal and breath sounds normal. No respiratory distress. He has no wheezes. He has no rales. He exhibits no  tenderness.  Abdominal: Soft. Bowel sounds are normal. He exhibits no distension and no mass. There is no tenderness. There is no rebound and no guarding.  Musculoskeletal: Normal range of motion. He exhibits no edema or tenderness.  Lymphadenopathy:    He has no cervical adenopathy.  Neurological: He is alert and oriented to person, place, and time. He has normal reflexes. He displays normal reflexes. No cranial nerve deficit. He exhibits normal muscle tone.  Skin: Skin is warm and dry. No rash noted. He is not diaphoretic. No erythema.  Psychiatric: He has a normal mood and affect. His behavior is normal. Judgment and thought content normal.  Nursing note and vitals reviewed.  BP 110/60 (BP Location: Left Arm, Cuff Size: Normal)   Pulse (!) 103   Temp 97.6 F (36.4 C) (Oral)   Resp 16   Ht 5\' 9"  (1.753 m)   Wt 154 lb 6.4 oz (70 kg)   SpO2 93%   BMI 22.80 kg/m  Wt Readings from Last 3 Encounters:  03/21/18 154 lb 6.4 oz (70 kg)  08/18/17 165 lb 12.8 oz (75.2 kg)  06/16/17 161 lb 9.6 oz (73.3 kg)     Lab Results  Component Value Date   WBC 8.7 04/06/2016   HGB 13.4 04/06/2016   HCT 40.2 04/06/2016   PLT 208.0 04/06/2016   GLUCOSE 95 08/18/2017   CHOL 118 08/18/2017   TRIG 82.0 08/18/2017   HDL 39.70 08/18/2017   LDLDIRECT 170.2 10/09/2007   LDLCALC 62 08/18/2017   ALT 6 08/18/2017   AST 15 08/18/2017   NA 141 08/18/2017   K 3.4 (L) 08/18/2017   CL 107 08/18/2017   CREATININE 1.12 08/18/2017   BUN 10 08/18/2017   CO2 27 08/18/2017   TSH 1.38 05/19/2010   PSA 31.37 (H) 04/06/2016   HGBA1C 5.1 04/02/2013   MICROALBUR 9.4 (H) 10/06/2015    Dg Cholangiogram Operative  Result Date: 06/30/2010 Clinical Data:   Gallstones.  INTRAOPERATIVE CHOLANGIOGRAM  Comparison: Ultrasound 05/22/2010  Findings: Intraoperative spot images show normal caliber biliary system.  No evidence of retained stone or obstruction.  Free passage of contrast into the small bowel noted.   IMPRESSION: No evidence of retained stone or obstruction.  These images were submitted for radiologic interpretation only. Please see the procedural report for the amount of contrast and the fluoroscopy time utilized. Provider: [place]:  Plan  I am having [name] maintain his dorzolamide-timolol, latanoprost, pilocarpine, diltiazem, cloNIDine, metoprolol succinate, and atorvastatin.  No orders of the defined types were placed in this encounter.   Problem List Items Addressed This Visit      Unprioritized   BPH without obstruction/lower urinary tract symptoms    Per urology      Essential hypertension    Well controlled, no changes to meds. Encouraged heart healthy diet such as the DASH diet and exercise as tolerated.       Relevant Orders   CBC with Differential/Platelet   Comprehensive metabolic panel   Lipid panel   Hyperlipidemia    Tolerating statin, encouraged heart healthy diet, avoid  trans fats, minimize simple carbs and saturated fats. Increase exercise as tolerated      Preventative health care - Primary    Ghm utd Check labs See AVS       Other Visit Diagnoses    Hyperlipidemia LDL goal <100       Relevant Orders   CBC with Differential/Platelet   Comprehensive metabolic panel   Lipid panel      Follow-up: Return in about 6 months (around 09/21/2018) for hypertension, hyperlipidemia.  [name], DO

## 2018-03-21 NOTE — Assessment & Plan Note (Signed)
Ghm utd Check labs  See AVS  

## 2018-03-21 NOTE — Assessment & Plan Note (Signed)
Per u rology 

## 2018-04-07 ENCOUNTER — Other Ambulatory Visit: Payer: Self-pay | Admitting: Family Medicine

## 2018-04-07 DIAGNOSIS — I10 Essential (primary) hypertension: Secondary | ICD-10-CM

## 2018-04-13 ENCOUNTER — Other Ambulatory Visit: Payer: Self-pay | Admitting: Family Medicine

## 2018-04-13 DIAGNOSIS — I10 Essential (primary) hypertension: Secondary | ICD-10-CM

## 2018-06-19 ENCOUNTER — Other Ambulatory Visit: Payer: Self-pay | Admitting: Family Medicine

## 2018-06-19 DIAGNOSIS — I10 Essential (primary) hypertension: Secondary | ICD-10-CM

## 2018-08-18 NOTE — Progress Notes (Addendum)
Subjective:   [name] [name] is a 84 y.o. male who presents for Medicare Annual/Subsequent preventive examination.  Pt is concerned because he states his glaucoma is getting worse.  Review of Systems: No ROS.  Medicare Wellness Visit. Additional risk factors are reflected in the social history.  Cardiac Risk Factors include: advanced age (>44men, >56 women);dyslipidemia;hypertension;male gender Sleep patterns: Sleeps well per pt. Home Safety/Smoke Alarms: Feels safe in home. Smoke alarms in place. Lives with wife in one story home.   Male:      PSA-  Lab Results  Component Value Date   PSA 31.37 (H) 04/06/2016   PSA 25.86 (H) 04/01/2015   PSA 16.51 (H) 04/02/2013       Objective:    Vitals: BP (!) 110/58 (BP Location: Left Arm, Patient Position: Sitting, Cuff Size: Normal)   Pulse (!) 55   Ht 5\' 9"  (1.753 m)   Wt 150 lb (68 kg)   SpO2 97%   BMI 22.15 kg/m   Body mass index is 22.15 kg/m.   Wt Readings from Last 3 Encounters:  08/21/18 150 lb (68 kg)  03/21/18 154 lb 6.4 oz (70 kg)  08/18/17 165 lb 12.8 oz (75.2 kg)   Temp Readings from Last 3 Encounters:  03/21/18 97.6 F (36.4 C) (Oral)  08/18/17 97.6 F (36.4 C) (Oral)  02/15/17 98.1 F (36.7 C) (Oral)   BP Readings from Last 3 Encounters:  08/21/18 (!) 110/58  03/21/18 110/60  08/18/17 127/66   Pulse Readings from Last 3 Encounters:  08/21/18 (!) 55  03/21/18 (!) 103  08/18/17 (!) 58     Advanced Directives 08/21/2018 08/18/2017 04/06/2016  Does Patient Have a Medical Advance Directive? No Yes No  Type of Advance Directive - [place];Living will -  Does patient want to make changes to medical advance directive? - No - Patient declined -  Copy of [place] in Chart? - No - copy requested -  Would patient like information on creating a medical advance directive? No - Patient declined - Yes - Scientist, clinical (histocompatibility and immunogenetics) given    Tobacco Social History   Tobacco  Use  Smoking Status Former Smoker  . Packs/day: 0.30  . Years: 20.00  . Pack years: 6.00  . Types: Cigarettes  . Last attempt to quit: 02/22/1993  . Years since quitting: 25.5  Smokeless Tobacco Never Used     Counseling given: Not Answered   Clinical Intake:     Pain : No/denies pain                 Past Medical History:  Diagnosis Date  . Abnormal EKG   . CAD in native artery    Nonobstructive 2005  . Elevated PSA   . Glaucoma   . Hearing impairment   . Hyperlipidemia   . Hypertension   . Prostatic hypertrophy, benign   . PSA elevation   . Renal insufficiency   . Routine general medical examination at a health care facility    Past Surgical History:  Procedure Laterality Date  . BLADDER SURGERY    . CATARACT EXTRACTION    . CHOLECYSTECTOMY  03/2010   [place] [facility]  . COLONOSCOPY    . removal of cyst     right arm  . SHOULDER SURGERY     cyst removed left shoulder  . TONSILECTOMY, ADENOIDECTOMY, BILATERAL MYRINGOTOMY AND TUBES    . TRANSURETHRAL RESECTION OF PROSTATE     Family  History  Problem Relation Age of Onset  . Coronary artery disease Mother   . Hypertension Mother   . Heart disease Mother   . Diabetes Sister   . Heart disease Sister 66       died during cabg  . Hypertension Daughter   . Hypertension Son    Social History   Socioeconomic History  . Marital status: Married    Spouse name: Not on file  . Number of children: Not on file  . Years of education: Not on file  . Highest education level: Not on file  Occupational History  . Occupation: retired Scientific laboratory technician Needs  . Financial resource strain: Not on file  . Food insecurity:    Worry: Not on file    Inability: Not on file  . Transportation needs:    Medical: Not on file    Non-medical: Not on file  Tobacco Use  . Smoking status: Former Smoker    Packs/day: 0.30    Years: 20.00    Pack years: 6.00    Types: Cigarettes    Last attempt to quit:  02/22/1993    Years since quitting: 25.5  . Smokeless tobacco: Never Used  Substance and Sexual Activity  . Alcohol use: No  . Drug use: Yes    Types: Marijuana  . Sexual activity: Not Currently    Partners: Female  Lifestyle  . Physical activity:    Days per week: Not on file    Minutes per session: Not on file  . Stress: Not on file  Relationships  . Social connections:    Talks on phone: Not on file    Gets together: Not on file    Attends religious service: Not on file    Active member of club or organization: Not on file    Attends meetings of clubs or organizations: Not on file    Relationship status: Not on file  Other Topics Concern  . Not on file  Social History Narrative   Exercise--- stair step,  Sit ups, pull ups and bike    Outpatient Encounter Medications as of 08/21/2018  Medication Sig  . atorvastatin (LIPITOR) 20 MG tablet Take 20 mg by mouth at bedtime.  . cloNIDine (CATAPRES) 0.1 MG tablet Take 1 tablet (0.1 mg total) by mouth 2 (two) times daily.  Marland Kitchen diltiazem (MATZIM LA) 420 MG 24 hr tablet Take 1 tablet (420 mg total) by mouth daily.  . dorzolamide-timolol (COSOPT) 22.3-6.8 MG/ML ophthalmic solution 1 drop 2 (two) times daily.    Marland Kitchen latanoprost (XALATAN) 0.005 % ophthalmic solution Place 1 drop into both eyes at bedtime.  . metoprolol succinate (TOPROL-XL) 25 MG 24 hr tablet Take 1 tablet (25 mg total) by mouth daily.  . pilocarpine (PILOCAR) 1 % ophthalmic solution Place 1 drop into the left eye 3 (three) times daily.  . [DISCONTINUED] cloNIDine (CATAPRES) 0.1 MG tablet TAKE 1 TABLET BY MOUTH TWICE DAILY   No facility-administered encounter medications on file as of 08/21/2018.     Activities of Daily Living In your present state of health, do you have any difficulty performing the following activities: 08/21/2018  Hearing? Y  Comment HOH Right Ear  Vision? Y  Comment Glaucoma is progressing  Difficulty concentrating or making decisions? Y  Walking or  climbing stairs? Y  Dressing or bathing? N  Doing errands, shopping? Y  Preparing Food and eating ? N  Using the Toilet? N  In the past six months,  have you accidently leaked urine? N  Do you have problems with loss of bowel control? N  Managing your Medications? N  Managing your Finances? N  Housekeeping or managing your Housekeeping? N  Some recent data might be hidden    Patient Care Team: Carollee Herter, Alferd Apa, DO as PCP - General Warden Fillers, MD as Consulting Physician (Ophthalmology) Minus Breeding, MD as Consulting Physician (Cardiology) Ladene Artist, MD as Consulting Physician (Gastroenterology) Bond, Tracie Harrier, MD as Referring Physician (Ophthalmology) Fleet Contras, MD as Consulting Physician (Nephrology) Christy Sartorius, MD as Referring Physician (Urology)   Assessment:   This is a routine wellness examination for [employer]. Physical assessment deferred to PCP.  Exercise Activities and Dietary recommendations Current Exercise Habits: Home exercise routine, Time (Minutes): 30, Frequency (Times/Week): 5, Weekly Exercise (Minutes/Week): 150, Intensity: Mild, Exercise limited by: None identified Diet (meal preparation, eat out, water intake, caffeinated beverages, dairy products, fruits and vegetables): 24 hour recall Breakfast: pancake and bacon Lunch: Chicken sandwich and sweet potaoes Dinner:    Left over chicken sandwich  Goals    . Maintain current health and independece       Fall Risk Fall Risk  08/21/2018 03/21/2018 04/06/2016 10/06/2015 04/01/2015  Falls in the past year? 1 No No No No  Number falls in past yr: 0 - - - -  Injury with Fall? 0 - - - -  Risk for fall due to : Impaired vision - - - -    Depression Screen PHQ 2/9 Scores 08/21/2018 03/21/2018 08/18/2017 07/22/2016  PHQ - 2 Score 0 0 0 0    Cognitive Function MMSE - Mini Mental State Exam 08/21/2018 08/18/2017 04/06/2016  Orientation to time 5 5 5   Orientation to Place 5 5 5     Registration 3 3 3   Attention/ Calculation 4 5 5   Recall 1 3 3   Language- name 2 objects 2 2 2   Language- repeat 1 1 1   Language- follow 3 step command 1 3 3   Language- read & follow direction 1 1 1   Write a sentence 0 1 1  Copy design 0 1 1  Total score 23 30 30         Immunization History  Administered Date(s) Administered  . Influenza Whole 08/03/2007    Screening Tests Health Maintenance  Topic Date Due  . TETANUS/TDAP  02/23/2015  . INFLUENZA VACCINE  08/22/2019 (Originally 04/13/2018)  . PNA vac Low Risk Adult (1 of 2 - PCV13) 08/22/2019 (Originally 01/12/1999)      Plan:    Please schedule your next medicare wellness visit with me in 1 yr.  Continue to eat heart healthy diet (full of fruits, vegetables, whole grains, lean protein, water--limit salt, fat, and sugar intake) and increase physical activity as tolerated.  Continue doing brain stimulating activities (puzzles, reading, adult coloring books, staying active) to keep memory sharp.    I have personally reviewed and noted the following in the patient's chart:   . Medical and social history . Use of alcohol, tobacco or illicit drugs  . Current medications and supplements . Functional ability and status . Nutritional status . Physical activity . Advanced directives . List of other physicians . Hospitalizations, surgeries, and ER visits in previous 12 months . Vitals . Screenings to include cognitive, depression, and falls . Referrals and appointments  In addition, I have reviewed and discussed with patient certain preventive protocols, quality metrics, and best practice recommendations. A written personalized care plan for preventive services  as well as general preventive health recommendations were provided to patient.     Shela Nevin, RN  08/21/2018  noted [name], DO

## 2018-08-21 ENCOUNTER — Ambulatory Visit (INDEPENDENT_AMBULATORY_CARE_PROVIDER_SITE_OTHER): Payer: Federal, State, Local not specified - PPO | Admitting: *Deleted

## 2018-08-21 ENCOUNTER — Encounter: Payer: Self-pay | Admitting: *Deleted

## 2018-08-21 VITALS — BP 110/58 | HR 55 | Ht 69.0 in | Wt 150.0 lb

## 2018-08-21 DIAGNOSIS — Z0000 Encounter for general adult medical examination without abnormal findings: Secondary | ICD-10-CM

## 2018-08-21 NOTE — Patient Instructions (Signed)
Please schedule your next medicare wellness visit with me in 1 yr.  Continue to eat heart healthy diet (full of fruits, vegetables, whole grains, lean protein, water--limit salt, fat, and sugar intake) and increase physical activity as tolerated.  Continue doing brain stimulating activities (puzzles, reading, adult coloring books, staying active) to keep memory sharp.    [name] [name] , Thank you for taking time to come for your Medicare Wellness Visit. I appreciate your ongoing commitment to your health goals. Please review the following plan we discussed and let me know if I can assist you in the future.   These are the goals we discussed: Goals    . Maintain current health and independece       This is a list of the screening recommended for you and due dates:  Health Maintenance  Topic Date Due  . Tetanus Vaccine  02/23/2015  . Flu Shot  08/22/2019*  . Pneumonia vaccines (1 of 2 - PCV13) 08/22/2019*  *Topic was postponed. The date shown is not the original due date.    Health Maintenance, Male A healthy lifestyle and preventive care is important for your health and wellness. Ask your health care provider about what schedule of regular examinations is right for you. What should I know about weight and diet? Eat a Healthy Diet  Eat plenty of vegetables, fruits, whole grains, low-fat dairy products, and lean protein.  Do not eat a lot of foods high in solid fats, added sugars, or salt.  Maintain a Healthy Weight Regular exercise can help you achieve or maintain a healthy weight. You should:  Do at least 150 minutes of exercise each week. The exercise should increase your heart rate and make you sweat (moderate-intensity exercise).  Do strength-training exercises at least twice a week.  Watch Your Levels of Cholesterol and Blood Lipids  Have your blood tested for lipids and cholesterol every 5 years starting at 82 years of age. If you are at high risk for heart disease, you  should start having your blood tested when you are 82 years old. You may need to have your cholesterol levels checked more often if: ? Your lipid or cholesterol levels are high. ? You are older than 82 years of age. ? You are at high risk for heart disease.  What should I know about cancer screening? Many types of cancers can be detected early and may often be prevented. Lung Cancer  You should be screened every year for lung cancer if: ? You are a current smoker who has smoked for at least 30 years. ? You are a former smoker who has quit within the past 15 years.  Talk to your health care provider about your screening options, when you should start screening, and how often you should be screened.  Colorectal Cancer  Routine colorectal cancer screening usually begins at 82 years of age and should be repeated every 5-10 years until you are 82 years old. You may need to be screened more often if early forms of precancerous polyps or small growths are found. Your health care provider may recommend screening at an earlier age if you have risk factors for colon cancer.  Your health care provider may recommend using home test kits to check for hidden blood in the stool.  A small camera at the end of a tube can be used to examine your colon (sigmoidoscopy or colonoscopy). This checks for the earliest forms of colorectal cancer.  Prostate and Testicular Cancer  Depending on your age and overall health, your health care provider may do certain tests to screen for prostate and testicular cancer.  Talk to your health care provider about any symptoms or concerns you have about testicular or prostate cancer.  Skin Cancer  Check your skin from head to toe regularly.  Tell your health care provider about any new moles or changes in moles, especially if: ? There is a change in a mole's size, shape, or color. ? You have a mole that is larger than a pencil eraser.  Always use sunscreen. Apply  sunscreen liberally and repeat throughout the day.  Protect yourself by wearing long sleeves, pants, a wide-brimmed hat, and sunglasses when outside.  What should I know about heart disease, diabetes, and high blood pressure?  If you are 65-92 years of age, have your blood pressure checked every 3-5 years. If you are 84 years of age or older, have your blood pressure checked every year. You should have your blood pressure measured twice-once when you are at a hospital or clinic, and once when you are not at a hospital or clinic. Record the average of the two measurements. To check your blood pressure when you are not at a hospital or clinic, you can use: ? An automated blood pressure machine at a pharmacy. ? A home blood pressure monitor.  Talk to your health care provider about your target blood pressure.  If you are between 9-89 years old, ask your health care provider if you should take aspirin to prevent heart disease.  Have regular diabetes screenings by checking your fasting blood sugar level. ? If you are at a normal weight and have a low risk for diabetes, have this test once every three years after the age of 81. ? If you are overweight and have a high risk for diabetes, consider being tested at a younger age or more often.  A one-time screening for abdominal aortic aneurysm (AAA) by ultrasound is recommended for men aged 58-75 years who are current or former smokers. What should I know about preventing infection? Hepatitis B If you have a higher risk for hepatitis B, you should be screened for this virus. Talk with your health care provider to find out if you are at risk for hepatitis B infection. Hepatitis C Blood testing is recommended for:  Everyone born from [birth year] through 1963.  Anyone with known risk factors for hepatitis C.  Sexually Transmitted Diseases (STDs)  You should be screened each year for STDs including gonorrhea and chlamydia if: ? You are sexually active  and are younger than 82 years of age. ? You are older than 82 years of age and your health care provider tells you that you are at risk for this type of infection. ? Your sexual activity has changed since you were last screened and you are at an increased risk for chlamydia or gonorrhea. Ask your health care provider if you are at risk.  Talk with your health care provider about whether you are at high risk of being infected with HIV. Your health care provider may recommend a prescription medicine to help prevent HIV infection.  What else can I do?  Schedule regular health, dental, and eye exams.  Stay current with your vaccines (immunizations).  Do not use any tobacco products, such as cigarettes, chewing tobacco, and e-cigarettes. If you need help quitting, ask your health care provider.  Limit alcohol intake to no more than 2 drinks per day. One drink  equals 12 ounces of beer, 5 ounces of wine, or 1 ounces of hard liquor.  Do not use street drugs.  Do not share needles.  Ask your health care provider for help if you need support or information about quitting drugs.  Tell your health care provider if you often feel depressed.  Tell your health care provider if you have ever been abused or do not feel safe at home. This information is not intended to replace advice given to you by your health care provider. Make sure you discuss any questions you have with your health care provider. Document Released: 02/26/2008 Document Revised: 04/28/2016 Document Reviewed: 06/03/2015 Elsevier Interactive Patient Education  [employer].

## 2018-09-21 ENCOUNTER — Ambulatory Visit: Payer: Federal, State, Local not specified - PPO | Admitting: Family Medicine

## 2018-09-25 ENCOUNTER — Ambulatory Visit: Payer: Federal, State, Local not specified - PPO | Admitting: Family Medicine

## 2018-09-25 ENCOUNTER — Encounter: Payer: Self-pay | Admitting: Family Medicine

## 2018-09-25 VITALS — BP 136/70 | HR 44 | Temp 97.9°F | Resp 16 | Ht 69.0 in | Wt 148.6 lb

## 2018-09-25 DIAGNOSIS — E785 Hyperlipidemia, unspecified: Secondary | ICD-10-CM | POA: Diagnosis not present

## 2018-09-25 DIAGNOSIS — R634 Abnormal weight loss: Secondary | ICD-10-CM | POA: Diagnosis not present

## 2018-09-25 DIAGNOSIS — I10 Essential (primary) hypertension: Secondary | ICD-10-CM

## 2018-09-25 LAB — COMPREHENSIVE METABOLIC PANEL
ALT: 6 U/L (ref 0–53)
AST: 12 U/L (ref 0–37)
Albumin: 3.7 g/dL (ref 3.5–5.2)
Alkaline Phosphatase: 53 U/L (ref 39–117)
BUN: 19 mg/dL (ref 6–23)
CHLORIDE: 103 meq/L (ref 96–112)
CO2: 27 meq/L (ref 19–32)
Calcium: 9.2 mg/dL (ref 8.4–10.5)
Creatinine, Ser: 1.54 mg/dL — ABNORMAL HIGH (ref 0.40–1.50)
GFR: 55.53 mL/min — AB (ref >60.00)
Glucose, Bld: 85 mg/dL (ref 70–99)
POTASSIUM: 4.1 meq/L (ref 3.5–5.1)
SODIUM: 137 meq/L (ref 135–145)
TOTAL PROTEIN: 6.8 g/dL (ref 6.0–8.3)
Total Bilirubin: 1 mg/dL (ref 0.2–1.2)

## 2018-09-25 LAB — LIPID PANEL
CHOLESTEROL: 202 mg/dL — AB (ref 0–200)
HDL: 43.3 mg/dL (ref >39.00)
LDL Cholesterol: 136 mg/dL — ABNORMAL HIGH (ref 0–99)
NONHDL: 158.37
Total CHOL/HDL Ratio: 5
Triglycerides: 110 mg/dL (ref 0.0–149.0)
VLDL: 22 mg/dL (ref 0.0–40.0)

## 2018-09-25 NOTE — Assessment & Plan Note (Signed)
Tolerating statin, encouraged heart healthy diet, avoid trans fats, minimize simple carbs and saturated fats. Increase exercise as tolerated 

## 2018-09-25 NOTE — Progress Notes (Signed)
Patient ID: RAYMAN PETROSIAN, male    DOB: [birth year]  Age: 85 y.o. MRN: [id number]    Subjective:  Subjective  HPI NASIR BRIGHT presents for f/u chol and bp.  He is also concerned because he has lost 12 lbs since Oct   No nausea, no abd pain.  No sob, chest pain , palp or dizziness.     Review of Systems  Constitutional: Positive for appetite change and unexpected weight change. Negative for diaphoresis and fatigue.  Eyes: Negative for pain, redness and visual disturbance.  Respiratory: Negative for cough, chest tightness, shortness of breath and wheezing.   Cardiovascular: Negative for chest pain, palpitations and leg swelling.  Endocrine: Negative for cold intolerance, heat intolerance, polydipsia, polyphagia and polyuria.  Genitourinary: Negative for difficulty urinating, dysuria and frequency.  Neurological: Negative for dizziness, light-headedness, numbness and headaches.    History Past Medical History:  Diagnosis Date  . Abnormal EKG   . CAD in native artery    Nonobstructive 2005  . Elevated PSA   . Glaucoma   . Hearing impairment   . Hyperlipidemia   . Hypertension   . Prostatic hypertrophy, benign   . PSA elevation   . Renal insufficiency   . Routine general medical examination at a health care facility     He has a past surgical history that includes Transurethral resection of prostate; Cholecystectomy (03/2010); Colonoscopy; Tonsilectomy, adenoidectomy, bilateral myringotomy and tubes; removal of cyst; Shoulder surgery; Bladder surgery; and Cataract extraction.   His family history includes Coronary artery disease in his mother; Diabetes in his sister; Heart disease in his mother; Heart disease (age of onset: 76) in his sister; Hypertension in his daughter, mother, and son.He reports that he quit smoking about 25 years ago. His smoking use included cigarettes. He has a 6.00 pack-year smoking history. He has never used smokeless tobacco. He reports current drug use.  Drug: Marijuana. He reports that he does not drink alcohol.  Current Outpatient Medications on File Prior to Visit  Medication Sig Dispense Refill  . atorvastatin (LIPITOR) 20 MG tablet Take 20 mg by mouth at bedtime.    . cloNIDine (CATAPRES) 0.1 MG tablet Take 1 tablet (0.1 mg total) by mouth 2 (two) times daily. 60 tablet 5  . diltiazem (MATZIM LA) 420 MG 24 hr tablet Take 1 tablet (420 mg total) by mouth daily. 90 tablet 1  . dorzolamide-timolol (COSOPT) 22.3-6.8 MG/ML ophthalmic solution 1 drop 2 (two) times daily.      Marland Kitchen latanoprost (XALATAN) 0.005 % ophthalmic solution Place 1 drop into both eyes at bedtime.    . metoprolol succinate (TOPROL-XL) 25 MG 24 hr tablet Take 1 tablet (25 mg total) by mouth daily. 90 tablet 1   No current facility-administered medications on file prior to visit.      Objective:  Objective  Physical Exam Vitals signs and nursing note reviewed.  Constitutional:      General: He is sleeping.     Appearance: He is well-developed.  HENT:     Head: Normocephalic and atraumatic.  Eyes:     Pupils: Pupils are equal, round, and reactive to light.  Neck:     Musculoskeletal: Normal range of motion and neck supple.     Thyroid: No thyromegaly.  Cardiovascular:     Rate and Rhythm: Normal rate and regular rhythm.     Heart sounds: No murmur.  Pulmonary:     Effort: Pulmonary effort is normal. No respiratory distress.  Breath sounds: Normal breath sounds. No wheezing or rales.  Chest:     Chest wall: No tenderness.  Abdominal:     General: There is no distension.     Tenderness: There is no abdominal tenderness. There is no guarding or rebound.  Musculoskeletal:        General: No tenderness.  Skin:    General: Skin is warm and dry.  Neurological:     Mental Status: He is oriented to person, place, and time.  Psychiatric:        Behavior: Behavior normal.        Thought Content: Thought content normal.        Judgment: Judgment normal.    BP  136/70 (BP Location: Right Arm, Cuff Size: Normal)   Pulse (!) 44   Temp 97.9 F (36.6 C) (Oral)   Resp 16   Ht 5\' 9"  (1.753 m)   Wt 148 lb 9.6 oz (67.4 kg)   SpO2 98%   BMI 21.94 kg/m  Wt Readings from Last 3 Encounters:  09/25/18 148 lb 9.6 oz (67.4 kg)  08/21/18 150 lb (68 kg)  03/21/18 154 lb 6.4 oz (70 kg)     Lab Results  Component Value Date   WBC 7.7 03/21/2018   HGB 12.5 (L) 03/21/2018   HCT 36.7 (L) 03/21/2018   PLT 209.0 03/21/2018   GLUCOSE 99 03/21/2018   CHOL 146 03/21/2018   TRIG 160.0 (H) 03/21/2018   HDL 39.20 03/21/2018   LDLDIRECT 170.2 10/09/2007   LDLCALC 75 03/21/2018   ALT 11 03/21/2018   AST 17 03/21/2018   NA 140 03/21/2018   K 4.3 03/21/2018   CL 105 03/21/2018   CREATININE 1.45 03/21/2018   BUN 14 03/21/2018   CO2 31 03/21/2018   TSH 1.38 05/19/2010   PSA 31.37 (H) 04/06/2016   HGBA1C 5.1 04/02/2013   MICROALBUR 9.4 (H) 10/06/2015    Dg Cholangiogram Operative  Result Date: 06/30/2010 Clinical Data:   Gallstones.  INTRAOPERATIVE CHOLANGIOGRAM  Comparison: Ultrasound 05/22/2010  Findings: Intraoperative spot images show normal caliber biliary system.  No evidence of retained stone or obstruction.  Free passage of contrast into the small bowel noted.  IMPRESSION: No evidence of retained stone or obstruction.  These images were submitted for radiologic interpretation only. Please see the procedural report for the amount of contrast and the fluoroscopy time utilized. Provider: [place]:  Plan  I have discontinued Kaivon E. Gavel's pilocarpine. I am also having him maintain his dorzolamide-timolol, latanoprost, cloNIDine, atorvastatin, diltiazem, and metoprolol succinate.  No orders of the defined types were placed in this encounter.   Problem List Items Addressed This Visit      Unprioritized   Essential hypertension - Primary    Well controlled, no changes to meds. Encouraged heart healthy diet such as  the DASH diet and exercise as tolerated.       Relevant Orders   Lipid panel   Comprehensive metabolic panel   Hyperlipidemia LDL goal <100    Tolerating statin, encouraged heart healthy diet, avoid trans fats, minimize simple carbs and saturated fats. Increase exercise as tolerated      Relevant Orders   Lipid panel   Comprehensive metabolic panel   Weight loss observed on examination    Add ensure 1-2 x a day between meals rto 2-3 weeks f/u weight          Follow-up: Return in about 4 weeks (around  10/23/2018), or weight check, for annual exam.  [name], DO

## 2018-09-25 NOTE — Patient Instructions (Signed)

## 2018-09-25 NOTE — Assessment & Plan Note (Signed)
Well controlled, no changes to meds. Encouraged heart healthy diet such as the DASH diet and exercise as tolerated.  °

## 2018-09-25 NOTE — Assessment & Plan Note (Signed)
Add ensure 1-2 x a day between meals rto 2-3 weeks f/u weight

## 2018-09-26 ENCOUNTER — Other Ambulatory Visit: Payer: Self-pay | Admitting: Family Medicine

## 2018-09-26 DIAGNOSIS — E785 Hyperlipidemia, unspecified: Secondary | ICD-10-CM

## 2018-09-29 ENCOUNTER — Other Ambulatory Visit: Payer: Self-pay | Admitting: *Deleted

## 2018-09-29 ENCOUNTER — Telehealth: Payer: Self-pay | Admitting: *Deleted

## 2018-09-29 ENCOUNTER — Encounter: Payer: Self-pay | Admitting: *Deleted

## 2018-09-29 DIAGNOSIS — E785 Hyperlipidemia, unspecified: Secondary | ICD-10-CM

## 2018-09-29 DIAGNOSIS — I10 Essential (primary) hypertension: Secondary | ICD-10-CM

## 2018-09-29 MED ORDER — ATORVASTATIN CALCIUM 40 MG PO TABS: 40.0000 mg | ORAL_TABLET | Freq: Every day | ORAL | 2 refills | Status: DC

## 2018-09-29 NOTE — Telephone Encounter (Signed)
Error

## 2018-10-24 ENCOUNTER — Ambulatory Visit: Payer: Federal, State, Local not specified - PPO

## 2018-10-24 DIAGNOSIS — R634 Abnormal weight loss: Secondary | ICD-10-CM

## 2018-10-24 NOTE — Progress Notes (Signed)
Patient here today for weight check per Dr. Etter Sjogren PCP. Weight at 09/25/2018 was 148lb.Today patient's weight was 151.8lb Per PCP continue current regimen. Patient has follow up in may. Told to keep that appointment but let us know if he needs to be seen sooner.

## 2018-12-18 ENCOUNTER — Other Ambulatory Visit: Payer: Self-pay | Admitting: Family Medicine

## 2018-12-18 DIAGNOSIS — I10 Essential (primary) hypertension: Secondary | ICD-10-CM

## 2019-01-16 ENCOUNTER — Encounter: Payer: Federal, State, Local not specified - PPO | Admitting: Family Medicine

## 2019-04-10 ENCOUNTER — Ambulatory Visit (INDEPENDENT_AMBULATORY_CARE_PROVIDER_SITE_OTHER): Payer: Federal, State, Local not specified - PPO | Admitting: Family Medicine

## 2019-04-10 ENCOUNTER — Other Ambulatory Visit: Payer: Self-pay

## 2019-04-10 ENCOUNTER — Encounter: Payer: Self-pay | Admitting: Family Medicine

## 2019-04-10 VITALS — BP 121/66 | HR 50 | Temp 98.9°F | Resp 18 | Ht 69.0 in | Wt 149.2 lb

## 2019-04-10 DIAGNOSIS — Z0000 Encounter for general adult medical examination without abnormal findings: Secondary | ICD-10-CM

## 2019-04-10 DIAGNOSIS — Z0001 Encounter for general adult medical examination with abnormal findings: Secondary | ICD-10-CM

## 2019-04-10 DIAGNOSIS — R531 Weakness: Secondary | ICD-10-CM

## 2019-04-10 DIAGNOSIS — E785 Hyperlipidemia, unspecified: Secondary | ICD-10-CM | POA: Diagnosis not present

## 2019-04-10 DIAGNOSIS — I10 Essential (primary) hypertension: Secondary | ICD-10-CM

## 2019-04-10 LAB — CBC WITH DIFFERENTIAL/PLATELET
Basophils Absolute: 0.1 10*3/uL (ref 0.0–0.1)
Basophils Relative: 0.9 % (ref 0.0–3.0)
Eosinophils Absolute: 0.1 10*3/uL (ref 0.0–0.7)
Eosinophils Relative: 1.9 % (ref 0.0–5.0)
HCT: 34.3 % — ABNORMAL LOW (ref 39.0–52.0)
Hemoglobin: 11.5 g/dL — ABNORMAL LOW (ref 13.0–17.0)
Lymphocytes Relative: 17.9 % (ref 12.0–46.0)
Lymphs Abs: 1.2 10*3/uL (ref 0.7–4.0)
MCHC: 33.6 g/dL (ref 30.0–36.0)
MCV: 92.6 fl (ref 78.0–100.0)
Monocytes Absolute: 0.5 10*3/uL (ref 0.1–1.0)
Monocytes Relative: 7.2 % (ref 3.0–12.0)
Neutro Abs: 4.6 10*3/uL (ref 1.4–7.7)
Neutrophils Relative %: 72.1 % (ref 43.0–77.0)
Platelets: 200 10*3/uL (ref 150.0–400.0)
RBC: 3.71 Mil/uL — ABNORMAL LOW (ref 4.22–5.81)
RDW: 13.4 % (ref 11.5–15.5)
WBC: 6.4 10*3/uL (ref 4.0–10.5)

## 2019-04-10 LAB — COMPREHENSIVE METABOLIC PANEL
ALT: 4 U/L (ref 0–53)
AST: 12 U/L (ref 0–37)
Albumin: 3.4 g/dL — ABNORMAL LOW (ref 3.5–5.2)
Alkaline Phosphatase: 47 U/L (ref 39–117)
BUN: 16 mg/dL (ref 6–23)
CO2: 26 mEq/L (ref 19–32)
Calcium: 8.7 mg/dL (ref 8.4–10.5)
Chloride: 108 mEq/L (ref 96–112)
Creatinine, Ser: 1.35 mg/dL (ref 0.40–1.50)
GFR: 60.74 mL/min (ref >60.00)
Glucose, Bld: 99 mg/dL (ref 70–99)
Potassium: 3.9 mEq/L (ref 3.5–5.1)
Sodium: 140 mEq/L (ref 135–145)
Total Bilirubin: 0.7 mg/dL (ref 0.2–1.2)
Total Protein: 6.6 g/dL (ref 6.0–8.3)

## 2019-04-10 LAB — LIPID PANEL
Cholesterol: 147 mg/dL (ref 0–200)
HDL: 38.6 mg/dL — ABNORMAL LOW (ref >39.00)
LDL Cholesterol: 89 mg/dL (ref 0–99)
NonHDL: 107.91
Total CHOL/HDL Ratio: 4
Triglycerides: 96 mg/dL (ref 0.0–149.0)
VLDL: 19.2 mg/dL (ref 0.0–40.0)

## 2019-04-10 LAB — VITAMIN B12: Vitamin B-12: 582 pg/mL (ref 211–911)

## 2019-04-10 LAB — TSH: TSH: 1.56 u[IU]/mL (ref 0.35–4.50)

## 2019-04-10 MED ORDER — METOPROLOL SUCCINATE ER 25 MG PO TB24: ORAL_TABLET | ORAL | 1 refills | Status: DC

## 2019-04-10 NOTE — Progress Notes (Signed)
Patient ID: [name] [name], male    DOB: 1932-05-25  Age: 85 y.o. MRN: [id number]    Subjective:  Subjective  HPI [name] [name] presents for f/u and cpe with labs   Pt has weakness/ fatigue.  No other complaints.    Review of Systems  Constitutional: Negative for appetite change, diaphoresis, fatigue and unexpected weight change.  Eyes: Negative for pain, redness and visual disturbance.  Respiratory: Negative for cough, chest tightness, shortness of breath and wheezing.   Cardiovascular: Negative for chest pain, palpitations and leg swelling.  Endocrine: Negative for cold intolerance, heat intolerance, polydipsia, polyphagia and polyuria.  Genitourinary: Negative for difficulty urinating, dysuria and frequency.  Neurological: Negative for dizziness, light-headedness, numbness and headaches.    History Past Medical History:  Diagnosis Date   Abnormal EKG    CAD in native artery    Nonobstructive 2005   Elevated PSA    Glaucoma    Hearing impairment    Hyperlipidemia    Hypertension    Prostatic hypertrophy, benign    PSA elevation    Renal insufficiency    Routine general medical examination at a health care facility     He has a past surgical history that includes Transurethral resection of prostate; Cholecystectomy (03/2010); Colonoscopy; Tonsilectomy, adenoidectomy, bilateral myringotomy and tubes; removal of cyst; Shoulder surgery; Bladder surgery; and Cataract extraction.   His family history includes Coronary artery disease in his mother; Diabetes in his sister; Heart disease in his mother; Heart disease (age of onset: 50) in his sister; Hypertension in his daughter, mother, and son.He reports that he quit smoking about 26 years ago. His smoking use included cigarettes. He has a 6.00 pack-year smoking history. He has never used smokeless tobacco. He reports current drug use. Drug: Marijuana. He reports that he does not drink alcohol.  Current Outpatient  Medications on File Prior to Visit  Medication Sig Dispense Refill   atorvastatin (LIPITOR) 40 MG tablet Take 1 tablet (40 mg total) by mouth daily. 30 tablet 2   cloNIDine (CATAPRES) 0.1 MG tablet Take 1 tablet (0.1 mg total) by mouth 2 (two) times daily. 60 tablet 5   diltiazem (MATZIM LA) 420 MG 24 hr tablet Take 1 tablet (420 mg total) by mouth daily. 90 tablet 1   dorzolamide-timolol (COSOPT) 22.3-6.8 MG/ML ophthalmic solution 1 drop 2 (two) times daily.       latanoprost (XALATAN) 0.005 % ophthalmic solution Place 1 drop into both eyes at bedtime.     No current facility-administered medications on file prior to visit.      Objective:  Objective  Physical Exam Vitals signs and nursing note reviewed.  Constitutional:      General: He is sleeping. He is not in acute distress.    Appearance: He is well-developed. He is not diaphoretic.  HENT:     Head: Normocephalic and atraumatic.     Right Ear: External ear normal.     Left Ear: External ear normal.     Nose: Nose normal.     Mouth/Throat:     Pharynx: No oropharyngeal exudate.  Eyes:     General:        Right eye: No discharge.        Left eye: No discharge.     Conjunctiva/sclera: Conjunctivae normal.     Pupils: Pupils are equal, round, and reactive to light.  Neck:     Musculoskeletal: Normal range of motion and neck supple.     Thyroid:  No thyromegaly.     Vascular: No JVD.  Cardiovascular:     Rate and Rhythm: Normal rate and regular rhythm.     Heart sounds: No murmur. No friction rub. No gallop.   Pulmonary:     Effort: Pulmonary effort is normal. No respiratory distress.     Breath sounds: Normal breath sounds. No wheezing or rales.  Chest:     Chest wall: No tenderness.  Abdominal:     General: Bowel sounds are normal. There is no distension.     Palpations: Abdomen is soft. There is no mass.     Tenderness: There is no abdominal tenderness. There is no guarding or rebound.  Genitourinary:    Penis:  Normal.      Prostate: Normal.     Rectum: Normal. Guaiac result negative.  Musculoskeletal: Normal range of motion.        General: No tenderness.  Lymphadenopathy:     Cervical: No cervical adenopathy.  Skin:    General: Skin is warm and dry.     Coloration: Skin is not pale.     Findings: No erythema or rash.  Neurological:     Mental Status: He is oriented to person, place, and time.     Motor: No abnormal muscle tone.     Deep Tendon Reflexes: Reflexes are normal and symmetric. Reflexes normal.  Psychiatric:        Behavior: Behavior normal.        Thought Content: Thought content normal.        Judgment: Judgment normal.    BP 121/66 (BP Location: Left Arm, Patient Position: Sitting, Cuff Size: Normal)    Pulse (!) 50    Temp 98.9 F (37.2 C) (Oral)    Resp 18    Ht 5\' 9"  (1.753 m)    Wt 149 lb 3.2 oz (67.7 kg)    SpO2 100%    BMI 22.03 kg/m  Wt Readings from Last 3 Encounters:  04/10/19 149 lb 3.2 oz (67.7 kg)  09/25/18 148 lb 9.6 oz (67.4 kg)  08/21/18 150 lb (68 kg)     Lab Results  Component Value Date   WBC 6.4 04/10/2019   HGB 11.5 (L) 04/10/2019   HCT 34.3 (L) 04/10/2019   PLT 200.0 04/10/2019   GLUCOSE 99 04/10/2019   CHOL 147 04/10/2019   TRIG 96.0 04/10/2019   HDL 38.60 (L) 04/10/2019   LDLDIRECT 170.2 10/09/2007   LDLCALC 89 04/10/2019   ALT 4 04/10/2019   AST 12 04/10/2019   NA 140 04/10/2019   K 3.9 04/10/2019   CL 108 04/10/2019   CREATININE 1.35 04/10/2019   BUN 16 04/10/2019   CO2 26 04/10/2019   TSH 1.56 04/10/2019   PSA 31.37 (H) 04/06/2016   HGBA1C 5.1 04/02/2013   MICROALBUR 9.4 (H) 10/06/2015    Dg Cholangiogram Operative  Result Date: 06/30/2010 Clinical Data:   Gallstones.  INTRAOPERATIVE CHOLANGIOGRAM  Comparison: Ultrasound 05/22/2010  Findings: Intraoperative spot images show normal caliber biliary system.  No evidence of retained stone or obstruction.  Free passage of contrast into the small bowel noted.  IMPRESSION: No  evidence of retained stone or obstruction.  These images were submitted for radiologic interpretation only. Please see the procedural report for the amount of contrast and the fluoroscopy time utilized. Provider: Charlton Haws    Assessment & Plan:  Plan  I have changed [name] E. Pedley's metoprolol succinate. I am also having him maintain his dorzolamide-timolol, latanoprost,  cloNIDine, diltiazem, and atorvastatin.  Meds ordered this encounter  Medications   metoprolol succinate (TOPROL-XL) 25 MG 24 hr tablet    Sig: 1/2 tab po qd    Dispense:  90 tablet    Refill:  1    Problem List Items Addressed This Visit      Unprioritized   Essential hypertension    Well controlled, no changes to meds. Encouraged heart healthy diet such as the DASH diet and exercise as tolerated. ---  Pulse a little low Dec metoprolol to 12.5 daily  F/u 3 weeks      Relevant Medications   metoprolol succinate (TOPROL-XL) 25 MG 24 hr tablet   Other Relevant Orders   Lipid panel (Completed)   Comprehensive metabolic panel (Completed)   Hyperlipidemia LDL goal <100    Tolerating statin, encouraged heart healthy diet, avoid trans fats, minimize simple carbs and saturated fats. Increase exercise as tolerated      Relevant Medications   metoprolol succinate (TOPROL-XL) 25 MG 24 hr tablet   Preventative health care - Primary    Other Visit Diagnoses    Weakness       Relevant Orders   CBC with Differential/Platelet (Completed)   Vitamin B12 (Completed)   Vitamin D 1,25 dihydroxy (Completed)   TSH (Completed)   Hyperlipidemia, unspecified hyperlipidemia type       Relevant Medications   metoprolol succinate (TOPROL-XL) 25 MG 24 hr tablet   Other Relevant Orders   Lipid panel (Completed)   Comprehensive metabolic panel (Completed)      Follow-up: Return in about 3 weeks (around 05/01/2019), or if symptoms worsen or fail to improve.  [name], DO

## 2019-04-10 NOTE — Patient Instructions (Signed)
Weakness °Weakness is a lack of strength. You may feel weak all over your body (generalized), or you may feel weak in one specific part of your body (focal). Common causes of weakness include: °· Infection and immune system disorders. °· Physical exhaustion. °· Internal bleeding or other blood loss that results in a lack of red blood cells (anemia). °· Dehydration. °· An imbalance in mineral (electrolyte) levels, such as potassium. °· Heart disease, circulation problems, or stroke. °Other causes include: °· Some medicines or cancer treatment. °· Stress, anxiety, or depression. °· Nervous system disorders. °· Thyroid disorders. °· Loss of muscle strength because of age or inactivity. °· Poor sleep quality or sleep disorders. °The cause of your weakness may not be known. Some causes of weakness can be serious, so it is important to see your health care provider. °Follow these instructions at home: °Activity °· Rest as needed. °· Try to get enough sleep. Most adults need 7-8 hours of quality sleep each night. Talk to your health care provider about how much sleep you need each night. °· Do exercises, such as arm curls and leg raises, for 30 minutes at least 2 days a week or as told by your health care provider. This helps build muscle strength. °· Consider working with a physical therapist or trainer who can develop an exercise plan to help you gain muscle strength. °General instructions ° °· Take over-the-counter and prescription medicines only as told by your health care provider. °· Eat a healthy, well-balanced diet. This includes: °? Proteins to build muscles, such as lean meats and fish. °? Fresh fruits and vegetables. °? Carbohydrates to boost energy, such as whole grains. °· Drink enough fluid to keep your urine pale yellow. °· Keep all follow-up visits as told by your health care provider. This is important. °Contact a health care provider if your weakness: °· Does not improve or gets worse. °· Affects your  ability to think clearly. °· Affects your ability to do your normal daily activities. °Get help right away if you: °· Develop sudden weakness, especially on one side of your face or body. °· Have chest pain. °· Have trouble breathing or shortness of breath. °· Have problems with your vision. °· Have trouble talking or swallowing. °· Have trouble standing or walking. °· Are light-headed or lose consciousness. °Summary °· Weakness is a lack of strength. You may feel weak all over your body or just in one specific part of your body. °· Weakness can be caused by a variety of things. In some cases, the cause may be unknown. °· Rest as needed, and try to get enough sleep. Most adults need 7-8 hours of quality sleep each night. °· Eat a healthy, well-balanced diet. °This information is not intended to replace advice given to you by your health care provider. Make sure you discuss any questions you have with your health care provider. °Document Released: 08/30/2005 Document Revised: 04/05/2018 Document Reviewed: 04/05/2018 °Elsevier Patient Education © 2020 Elsevier Inc. ° °

## 2019-04-11 ENCOUNTER — Other Ambulatory Visit: Payer: Self-pay | Admitting: Family Medicine

## 2019-04-11 ENCOUNTER — Other Ambulatory Visit: Payer: Self-pay

## 2019-04-11 DIAGNOSIS — D649 Anemia, unspecified: Secondary | ICD-10-CM

## 2019-04-17 LAB — VITAMIN D 1,25 DIHYDROXY
Vitamin D 1, 25 (OH)2 Total: 40 pg/mL (ref 18–72)
Vitamin D2 1, 25 (OH)2: <8 pg/mL
Vitamin D3 1, 25 (OH)2: 40 pg/mL

## 2019-04-18 NOTE — Assessment & Plan Note (Signed)
Tolerating statin, encouraged heart healthy diet, avoid trans fats, minimize simple carbs and saturated fats. Increase exercise as tolerated 

## 2019-04-18 NOTE — Assessment & Plan Note (Addendum)
Well controlled, no changes to meds. Encouraged heart healthy diet such as the DASH diet and exercise as tolerated. ---  Pulse a little low Dec metoprolol to 12.5 daily  F/u 3 weeks

## 2019-04-21 NOTE — Progress Notes (Signed)
HPI The patient presents for follow up of a mildly reduced ejection fraction related to hypertension. He did have a cath 2005 with nonobstructive coronary disease.   I did an echo in 2014 and the EF was well preserved.  I have been following him for difficult to control hypertension.  Since I last saw him he has done well.  The patient denies any new symptoms such as chest discomfort, neck or arm discomfort. There has been no new shortness of breath, PND or orthopnea. There have been no reported palpitations, presyncope or syncope.  He biggest issue has been  with his vision which is getting worse because of glaucoma.  Allergies  Allergen Reactions  . Latanoprost Other (See Comments)    Eye irritation    Current Outpatient Medications  Medication Sig Dispense Refill  . atorvastatin (LIPITOR) 40 MG tablet Take 1 tablet (40 mg total) by mouth daily. 30 tablet 2  . cloNIDine (CATAPRES) 0.1 MG tablet Take 1 tablet (0.1 mg total) by mouth 2 (two) times daily. 60 tablet 5  . diltiazem (MATZIM LA) 420 MG 24 hr tablet Take 1 tablet (420 mg total) by mouth daily. 90 tablet 1  . dorzolamide-timolol (COSOPT) 22.3-6.8 MG/ML ophthalmic solution 1 drop 2 (two) times daily.      Marland Kitchen latanoprost (XALATAN) 0.005 % ophthalmic solution Place 1 drop into both eyes at bedtime.    . metoprolol succinate (TOPROL-XL) 25 MG 24 hr tablet 1/2 tab po qd 90 tablet 1  . RHOPRESSA 0.02 % SOLN Place 1 drop into both eyes daily.     No current facility-administered medications for this visit.     Past Medical History:  Diagnosis Date  . Abnormal EKG   . CAD in native artery    Nonobstructive 2005  . Elevated PSA   . Glaucoma   . Hearing impairment   . Hyperlipidemia   . Hypertension   . Prostatic hypertrophy, benign   . PSA elevation   . Renal insufficiency   . Routine general medical examination at a health care facility     Past Surgical History:  Procedure Laterality Date  . BLADDER SURGERY    .  CATARACT EXTRACTION    . CHOLECYSTECTOMY  03/2010   [facility]  . COLONOSCOPY    . removal of cyst     right arm  . SHOULDER SURGERY     cyst removed left shoulder  . TONSILECTOMY, ADENOIDECTOMY, BILATERAL MYRINGOTOMY AND TUBES    . TRANSURETHRAL RESECTION OF PROSTATE      ROS:  As stated in the HPI and negative for all other systems.  PHYSICAL EXAM BP 132/84 (BP Location: Left Arm, Patient Position: Sitting, Cuff Size: Normal)   Pulse (!) 53   Temp (!) 96.8 F (36 C)   Ht 5\' 9"  (1.753 m)   Wt 148 lb (67.1 kg)   BMI 21.86 kg/m   GENERAL:  Well appearing NECK:  No jugular venous distention, waveform within normal limits, carotid upstroke brisk and symmetric, no bruits, no thyromegaly LUNGS:  Clear to auscultation bilaterally CHEST:  Unremarkable HEART:  PMI not displaced or sustained,S1 and S2 within normal limits, no S3, no S4, no clicks, no rubs, no murmurs ABD:  Flat, positive bowel sounds normal in frequency in pitch, no bruits, no rebound, no guarding, no midline pulsatile mass, no hepatomegaly, no splenomegaly EXT:  2 plus pulses throughout, no edema, no cyanosis no clubbing   Lab Results  Component Value  Date   CHOL 147 04/10/2019   TRIG 96.0 04/10/2019   HDL 38.60 (L) 04/10/2019   LDLCALC 89 04/10/2019   LDLDIRECT 170.2 10/09/2007    EKG:  Sinus bradycardia, rate 53, axis within normal limits, intervals with prolonged QT, deep T-wave inversions unchanged from previous. 04/23/2019  ASSESSMENT AND PLAN  HTN:  His blood pressure is controlled.  No change in therapy.   CAD:  This was nonobstructive.  No change in therapy.   CARDIOMYOPATHY:  His EF was normal in 2014.  I would not suspect this to be changed.  No change in therapy or further imaging.   DYSLIPIDEMIA:  His most recent LDL was 89.  He will remain on the meds as listed.   ABNORMAL EKG: He has a markedly abnormal EKG but it has been unchanged over frequent evaluations.  Copy of this given  to him previously and we discussed it.  No change in therapy.  No further evaluation.

## 2019-04-23 ENCOUNTER — Ambulatory Visit: Payer: Federal, State, Local not specified - PPO | Admitting: Cardiology

## 2019-04-23 ENCOUNTER — Other Ambulatory Visit: Payer: Self-pay

## 2019-04-23 ENCOUNTER — Encounter: Payer: Self-pay | Admitting: Cardiology

## 2019-04-23 VITALS — BP 132/84 | HR 53 | Temp 96.8°F | Ht 69.0 in | Wt 148.0 lb

## 2019-04-23 DIAGNOSIS — R9431 Abnormal electrocardiogram [ECG] [EKG]: Secondary | ICD-10-CM | POA: Diagnosis not present

## 2019-04-23 DIAGNOSIS — I10 Essential (primary) hypertension: Secondary | ICD-10-CM | POA: Diagnosis not present

## 2019-04-23 DIAGNOSIS — I2510 Atherosclerotic heart disease of native coronary artery without angina pectoris: Secondary | ICD-10-CM

## 2019-04-23 NOTE — Patient Instructions (Signed)
Medication Instructions:  Your physician recommends that you continue on your current medications as directed. Please refer to the Current Medication list given to you today.  If you need a refill on your cardiac medications before your next appointment, please call your pharmacy.   Lab work: NONE  Testing/Procedures: NONE  Follow-Up: At [facility], you and your health needs are our priority.  As part of our continuing mission to provide you with exceptional heart care, we have created designated Provider Care Teams.  These Care Teams include your primary Cardiologist (physician) and Advanced Practice Providers (APPs -  Physician Assistants and Nurse Practitioners) who all work together to provide you with the care you need, when you need it. You will need a follow up appointment in 12 months.  Please call our office 2 months in advance to schedule this appointment.  You WILL SEE one of the following Advanced Practice Providers on your designated Care Team:   Rosaria Ferries, PA-C . Jory Sims, DNP, ANP

## 2019-05-01 ENCOUNTER — Encounter: Payer: Self-pay | Admitting: Family Medicine

## 2019-05-01 ENCOUNTER — Other Ambulatory Visit: Payer: Self-pay

## 2019-05-01 ENCOUNTER — Ambulatory Visit: Payer: Federal, State, Local not specified - PPO | Admitting: Family Medicine

## 2019-05-01 VITALS — BP 134/90 | HR 62 | Temp 98.3°F | Resp 18 | Ht 69.0 in | Wt 143.6 lb

## 2019-05-01 DIAGNOSIS — E785 Hyperlipidemia, unspecified: Secondary | ICD-10-CM | POA: Diagnosis not present

## 2019-05-01 DIAGNOSIS — I10 Essential (primary) hypertension: Secondary | ICD-10-CM | POA: Diagnosis not present

## 2019-05-01 DIAGNOSIS — R413 Other amnesia: Secondary | ICD-10-CM

## 2019-05-01 LAB — CBC WITH DIFFERENTIAL/PLATELET
Basophils Absolute: 0.1 10*3/uL (ref 0.0–0.1)
Basophils Relative: 0.9 % (ref 0.0–3.0)
Eosinophils Absolute: 0.1 10*3/uL (ref 0.0–0.7)
Eosinophils Relative: 1.4 % (ref 0.0–5.0)
HCT: 37 % — ABNORMAL LOW (ref 39.0–52.0)
Hemoglobin: 12.2 g/dL — ABNORMAL LOW (ref 13.0–17.0)
Lymphocytes Relative: 20.7 % (ref 12.0–46.0)
Lymphs Abs: 1.4 10*3/uL (ref 0.7–4.0)
MCHC: 32.9 g/dL (ref 30.0–36.0)
MCV: 92.5 fl (ref 78.0–100.0)
Monocytes Absolute: 0.5 10*3/uL (ref 0.1–1.0)
Monocytes Relative: 7.9 % (ref 3.0–12.0)
Neutro Abs: 4.8 10*3/uL (ref 1.4–7.7)
Neutrophils Relative %: 69.1 % (ref 43.0–77.0)
Platelets: 190 10*3/uL (ref 150.0–400.0)
RBC: 4 Mil/uL — ABNORMAL LOW (ref 4.22–5.81)
RDW: 13.4 % (ref 11.5–15.5)
WBC: 6.9 10*3/uL (ref 4.0–10.5)

## 2019-05-01 LAB — COMPREHENSIVE METABOLIC PANEL
ALT: 7 U/L (ref 0–53)
AST: 14 U/L (ref 0–37)
Albumin: 3.7 g/dL (ref 3.5–5.2)
Alkaline Phosphatase: 48 U/L (ref 39–117)
BUN: 27 mg/dL — ABNORMAL HIGH (ref 6–23)
CO2: 26 mEq/L (ref 19–32)
Calcium: 9.1 mg/dL (ref 8.4–10.5)
Chloride: 106 mEq/L (ref 96–112)
Creatinine, Ser: 1.81 mg/dL — ABNORMAL HIGH (ref 0.40–1.50)
GFR: 43.3 mL/min — ABNORMAL LOW (ref >60.00)
Glucose, Bld: 89 mg/dL (ref 70–99)
Potassium: 3.7 mEq/L (ref 3.5–5.1)
Sodium: 139 mEq/L (ref 135–145)
Total Bilirubin: 0.9 mg/dL (ref 0.2–1.2)
Total Protein: 6.7 g/dL (ref 6.0–8.3)

## 2019-05-01 LAB — LIPID PANEL
Cholesterol: 166 mg/dL (ref 0–200)
HDL: 38.8 mg/dL — ABNORMAL LOW (ref >39.00)
LDL Cholesterol: 106 mg/dL — ABNORMAL HIGH (ref 0–99)
NonHDL: 127.64
Total CHOL/HDL Ratio: 4
Triglycerides: 109 mg/dL (ref 0.0–149.0)
VLDL: 21.8 mg/dL (ref 0.0–40.0)

## 2019-05-01 LAB — VITAMIN B12: Vitamin B-12: 497 pg/mL (ref 211–911)

## 2019-05-01 LAB — TSH: TSH: 1.71 u[IU]/mL (ref 0.35–4.50)

## 2019-05-01 NOTE — Assessment & Plan Note (Signed)
Check labs Refer to neuro  mmse 22/30

## 2019-05-01 NOTE — Assessment & Plan Note (Signed)
Tolerating statin, encouraged heart healthy diet, avoid trans fats, minimize simple carbs and saturated fats. Increase exercise as tolerated 

## 2019-05-01 NOTE — Progress Notes (Signed)
Patient ID: [name] [name], male    DOB: 08/11/1932  Age: 85 y.o. MRN: [id number]    Subjective:  Subjective  HPI [name] presents for f/u bp and cholesterol His wife is with him-- c/o memory problems over the last year.  Pt wife drives him everywhere due to pt poor vision.   Review of Systems  Constitutional: Negative for appetite change, diaphoresis, fatigue and unexpected weight change.  Eyes: Negative for pain, redness and visual disturbance.  Respiratory: Negative for cough, chest tightness, shortness of breath and wheezing.   Cardiovascular: Negative for chest pain, palpitations and leg swelling.  Endocrine: Negative for cold intolerance, heat intolerance, polydipsia, polyphagia and polyuria.  Genitourinary: Negative for difficulty urinating, dysuria and frequency.  Neurological: Negative for dizziness, syncope, facial asymmetry, speech difficulty, weakness, light-headedness, numbness and headaches.  Psychiatric/Behavioral: Positive for confusion and decreased concentration. Negative for agitation, behavioral problems, dysphoric mood, hallucinations, self-injury, sleep disturbance and suicidal ideas. The patient is not nervous/anxious and is not hyperactive.     History Past Medical History:  Diagnosis Date  . Abnormal EKG   . CAD in native artery    Nonobstructive 2005  . Elevated PSA   . Glaucoma   . Hearing impairment   . Hyperlipidemia   . Hypertension   . Prostatic hypertrophy, benign   . PSA elevation   . Renal insufficiency   . Routine general medical examination at a health care facility     He has a past surgical history that includes Transurethral resection of prostate; Cholecystectomy (03/2010); Colonoscopy; Tonsilectomy, adenoidectomy, bilateral myringotomy and tubes; removal of cyst; Shoulder surgery; Bladder surgery; and Cataract extraction.   His family history includes Alzheimer's disease in his father; Coronary artery disease in his mother; Diabetes  in his sister; Heart disease in his mother; Heart disease (age of onset: 97) in his sister; Hypertension in his daughter, mother, and son.He reports that he quit smoking about 26 years ago. His smoking use included cigarettes. He has a 6.00 pack-year smoking history. He has never used smokeless tobacco. He reports current drug use. Drug: Marijuana. He reports that he does not drink alcohol.  Current Outpatient Medications on File Prior to Visit  Medication Sig Dispense Refill  . atorvastatin (LIPITOR) 40 MG tablet Take 1 tablet (40 mg total) by mouth daily. 30 tablet 2  . cloNIDine (CATAPRES) 0.1 MG tablet Take 1 tablet (0.1 mg total) by mouth 2 (two) times daily. 60 tablet 5  . diltiazem (MATZIM LA) 420 MG 24 hr tablet Take 1 tablet (420 mg total) by mouth daily. 90 tablet 1  . dorzolamide-timolol (COSOPT) 22.3-6.8 MG/ML ophthalmic solution 1 drop 2 (two) times daily.      [name] [name] latanoprost (XALATAN) 0.005 % ophthalmic solution Place 1 drop into both eyes at bedtime.    . metoprolol succinate (TOPROL-XL) 25 MG 24 hr tablet 1/2 tab po qd 90 tablet 1  . RHOPRESSA 0.02 % SOLN Place 1 drop into both eyes daily.     No current facility-administered medications on file prior to visit.      Objective:  Objective  Physical Exam Vitals signs and nursing note reviewed.  Constitutional:      General: He is sleeping.     Appearance: He is well-developed.  HENT:     Head: Normocephalic and atraumatic.  Eyes:     Pupils: Pupils are equal, round, and reactive to light.  Neck:     Musculoskeletal: Normal range of motion and neck supple.  Thyroid: No thyromegaly.  Cardiovascular:     Rate and Rhythm: Normal rate and regular rhythm.     Heart sounds: No murmur.  Pulmonary:     Effort: Pulmonary effort is normal. No respiratory distress.     Breath sounds: Normal breath sounds. No wheezing or rales.  Chest:     Chest wall: No tenderness.  Musculoskeletal:        General: No tenderness.  Skin:     General: Skin is warm and dry.  Neurological:     Mental Status: He is disoriented and confused.     Comments: mmse 22/30  Psychiatric:        Behavior: Behavior normal.        Thought Content: Thought content normal.        Judgment: Judgment normal.    BP 134/90 (BP Location: Left Arm, Patient Position: Sitting, Cuff Size: Normal)   Pulse 62   Temp 98.3 F (36.8 C) (Temporal)   Resp 18   Ht 5\' 9"  (1.753 m)   Wt 143 lb 9.6 oz (65.1 kg)   SpO2 97%   BMI 21.21 kg/m  Wt Readings from Last 3 Encounters:  05/01/19 143 lb 9.6 oz (65.1 kg)  04/23/19 148 lb (67.1 kg)  04/10/19 149 lb 3.2 oz (67.7 kg)     Lab Results  Component Value Date   WBC 6.4 04/10/2019   HGB 11.5 (L) 04/10/2019   HCT 34.3 (L) 04/10/2019   PLT 200.0 04/10/2019   GLUCOSE 99 04/10/2019   CHOL 147 04/10/2019   TRIG 96.0 04/10/2019   HDL 38.60 (L) 04/10/2019   LDLDIRECT 170.2 10/09/2007   LDLCALC 89 04/10/2019   ALT 4 04/10/2019   AST 12 04/10/2019   NA 140 04/10/2019   K 3.9 04/10/2019   CL 108 04/10/2019   CREATININE 1.35 04/10/2019   BUN 16 04/10/2019   CO2 26 04/10/2019   TSH 1.56 04/10/2019   PSA 31.37 (H) 04/06/2016   HGBA1C 5.1 04/02/2013   MICROALBUR 9.4 (H) 10/06/2015    Dg Cholangiogram Operative  Result Date: 06/30/2010 Clinical Data:   Gallstones.  INTRAOPERATIVE CHOLANGIOGRAM  Comparison: Ultrasound 05/22/2010  Findings: Intraoperative spot images show normal caliber biliary system.  No evidence of retained stone or obstruction.  Free passage of contrast into the small bowel noted.  IMPRESSION: No evidence of retained stone or obstruction.  These images were submitted for radiologic interpretation only. Please see the procedural report for the amount of contrast and the fluoroscopy time utilized. Provider: Hodgkins:  Plan  I am having [name] maintain his dorzolamide-timolol, latanoprost, cloNIDine, diltiazem, atorvastatin, metoprolol succinate,  and Rhopressa.  No orders of the defined types were placed in this encounter.   Problem List Items Addressed This Visit      Unprioritized   Essential hypertension - Primary   Relevant Orders   Lipid panel   Comprehensive metabolic panel    Other Visit Diagnoses    Hyperlipidemia, unspecified hyperlipidemia type       Relevant Orders   Lipid panel   Comprehensive metabolic panel   Memory loss       Relevant Orders   Ambulatory referral to Neurology   CBC with Differential/Platelet   TSH   Vitamin B12      Follow-up: Return in about 6 months (around 11/01/2019), or if symptoms worsen or fail to improve, for annual exam, fasting.  [name], DO

## 2019-05-01 NOTE — Patient Instructions (Signed)
Alzheimer's Disease Alzheimer's disease is a brain disease that affects memory, thinking, language, and behavior. People with Alzheimer's disease lose mental abilities, and the disease gets worse over time. Alzheimer's disease is a form of dementia. What are the causes? This condition develops when a protein called beta-amyloid forms deposits in the brain. It is not known what causes these deposits to form. Alzheimer's disease may also be caused by a gene mutation that is inherited from one parent or both parents. A gene mutation is a harmful change in a gene. Not everyone who inherits the genetic mutation will get the disease. What increases the risk? You are more likely to develop this condition if you:  Are older than age 65.  Have a family history of dementia.  Have had a brain injury.  Have heart or blood vessel disease.  Have had a stroke.  Have high blood pressure or high cholesterol.  Have diabetes. What are the signs or symptoms? Symptoms of this condition may happen in three stages, which often overlap. Early stage In this stage, you may continue to be independent. You may still be able to drive, work, and be social. Symptoms in this stage include:  Minor memory problems, such as forgetting a name or what you read.  Difficulty with: ? Paying attention. ? Communicating. ? Doing familiar tasks. ? Problem solving or doing calculations. ? Following instructions. ? Learning new things.  Anxiety.  Social withdrawal.  Loss of motivation. Moderate stage In this stage, you will start to need care. Symptoms in this stage include:  Difficulty with expressing thoughts.  Memory loss that affects daily life. This can include forgetting: ? Your address or phone number. ? Recent events that have happened. ? Parts of your personal history, such as where you went to school.  Confusion about where you are or what time it is.  Difficulty in judging distance.  Changes in  personality, mood, and behavior. You may be moody, irritable, angry, frustrated, fearful, anxious, or suspicious.  Poor reasoning and judgment.  Delusions or hallucinations.  Changes in sleep patterns.  Wandering and getting lost, even in familiar places. Severe stage In the final stage, you will need help with your personal care and daily activities. Symptoms in this stage include:  Worsening memory loss.  Personality changes.  Loss of awareness of your surroundings.  Changes in physical abilities, including the ability to walk, sit, and swallow.  Difficulty in communicating.  Inability to control your bladder and bowels.  Increasing confusion.  Increasing behavior changes. How is this diagnosed? This condition is diagnosed by a health care provider who specializes in diseases of the nervous system (neurologist). Other causes of dementia may also be ruled out. Your health care provider will talk with you and your family, friends, or caregivers about your history and symptoms. A thorough medical history will be taken, and you will have a physical exam and tests. Tests may include:  Lab tests, such as blood or urine tests.  Imaging tests, such as a CT scan, a PET scan, or an MRI.  A lumbar puncture. This test involves removing and testing a small amount of the fluid that surrounds the brain and spinal cord.  An electroencephalogram (EEG). In this test, small metal discs are used to measure electrical activity in the brain.  Memory tests, cognitive tests, and neuropsychological tests. These tests evaluate brain function.  Genetic testing may be done if you have early onset of the disease (before age 60) or if   other family members have the disease. How is this treated? At this time, there is no treatment to cure Alzheimer's disease or stop it from getting worse. The goals of treatment are:  To slow down symptoms of the disease, if possible.  To manage behavioral changes.   To provide you with a safe environment.  To help manage daily life for you and your caregivers. The following treatment options are available:  Medicines. Medicines may help to slow down memory loss and manage behavioral symptoms.  Cognitive therapy. Cognitive therapy provides you with education, support, and memory aids. It is most helpful in the early stages of the condition.  Counseling or spiritual guidance. It is normal to have a lot of feelings, including anger, relief, fear, and isolation. Counseling and guidance can help you deal with these feelings.  Caregiving. This involves having caregivers help you with your daily activities.  Family support groups. These provide education, emotional support, and information about community resources to family members who are taking care of you. Follow these instructions at home:  Medicines  Take over-the-counter and prescription medicines only as told by your health care provider.  Use a pill organizer or pill reminder to help you manage your medicines.  Avoid taking medicines that can affect thinking, such as pain medicines or sleeping medicines. Lifestyle  Make healthy lifestyle choices: ? Be physically active as told by your health care provider. Regular exercise may help improve symptoms. ? Do not use any products that contain nicotine or tobacco, such as cigarettes, e-cigarettes, and chewing tobacco. If you need help quitting, ask your health care provider. ? Do not drink alcohol. ? Eat a healthy diet. ? Practice stress-management techniques when you get stressed. ? Stay social.  Drink enough fluid to keep your urine pale yellow.  Make sure to get quality sleep. ? Avoid taking long naps during the day. Take short naps of 30 minutes or less if needed. ? Keep your sleeping area dark and cool. ? Avoid exercising during the few hours before you go to bed. ? Avoid caffeine products in the afternoon and evening. General  instructions  Work with your health care provider to determine what you need help with and what your safety needs are.  If you were given a bracelet that identifies you as a person with memory loss or tracks your location, make sure to wear it at all times.  Talk with your health care provider about whether it is safe for you to drive.  Work with your family to make important decisions, such as advance directives, medical power of attorney, or a living will.  Keep all follow-up visits as told by your health care provider. This is important. Where to find more information  The Alzheimer's Association: Call the 24-hour helpline at 1-(210) 684-4603, or visit [employer] Contact a health care provider if:  You have nausea, vomiting, or trouble with eating.  You have dizziness or weakness.  You or your family members become concerned for your safety. Get help right away if:  You feel depressed or sad, or feel that you want to harm yourself.  You develop chest pain or difficulty with breathing.  You pass out. If you ever feel like you may hurt yourself or others, or have thoughts about taking your own life, get help right away. You can go to your nearest emergency department or call:  Your local emergency services (911 in the [place]  A suicide crisis helpline, such as the National Suicide Prevention  Lifeline at 2134390061. This is open 24 hours a day. Summary  Alzheimer's disease is a brain disease that affects memory, thinking, language, and behavior. Alzheimer's disease is a form of dementia.  This condition is diagnosed by a specialist in diseases of the nervous system (neurologist).  At this time, there is no treatment to cure Alzheimer's disease or stop it from getting worse. The goals of treatment are to slow memory loss and help you manage any symptoms.  Work with your family to make important decisions, such as advance directives, medical power of attorney, or a living  will. This information is not intended to replace advice given to you by your health care provider. Make sure you discuss any questions you have with your health care provider. Document Released: 05/11/2004 Document Revised: 08/08/2018 Document Reviewed: 08/08/2018 Elsevier Patient Education  2020 [employer].

## 2019-05-01 NOTE — Assessment & Plan Note (Signed)
Well controlled, no changes to meds. Encouraged heart healthy diet such as the DASH diet and exercise as tolerated.  °

## 2019-05-03 ENCOUNTER — Telehealth: Payer: Self-pay | Admitting: *Deleted

## 2019-05-03 NOTE — Telephone Encounter (Signed)
Received call from [facility] stating pt's IFOB needs recollecting as there was a lot of stool on the outside of the IFOB bottle and they are unable to test that.

## 2019-05-04 NOTE — Telephone Encounter (Signed)
Called patient. Verbalized understanding. Resending another kit.

## 2019-05-07 ENCOUNTER — Encounter: Payer: Self-pay | Admitting: *Deleted

## 2019-05-24 ENCOUNTER — Other Ambulatory Visit (INDEPENDENT_AMBULATORY_CARE_PROVIDER_SITE_OTHER): Payer: Federal, State, Local not specified - PPO

## 2019-05-24 DIAGNOSIS — D649 Anemia, unspecified: Secondary | ICD-10-CM | POA: Diagnosis not present

## 2019-05-24 LAB — FECAL OCCULT BLOOD, IMMUNOCHEMICAL: Fecal Occult Bld: NEGATIVE

## 2019-07-26 ENCOUNTER — Other Ambulatory Visit: Payer: Self-pay | Admitting: Family Medicine

## 2019-07-26 DIAGNOSIS — I10 Essential (primary) hypertension: Secondary | ICD-10-CM

## 2019-08-03 ENCOUNTER — Other Ambulatory Visit: Payer: Self-pay | Admitting: Family Medicine

## 2019-08-03 DIAGNOSIS — I10 Essential (primary) hypertension: Secondary | ICD-10-CM

## 2019-08-06 NOTE — Telephone Encounter (Signed)
Please call pt about this

## 2019-08-06 NOTE — Telephone Encounter (Signed)
Advise on this refill looks like last refilled in 2017

## 2019-08-06 NOTE — Telephone Encounter (Signed)
Called left message to call back the office regarding refill request question.

## 2019-08-23 ENCOUNTER — Ambulatory Visit: Payer: Federal, State, Local not specified - PPO | Admitting: *Deleted

## 2019-11-02 ENCOUNTER — Ambulatory Visit: Payer: Federal, State, Local not specified - PPO | Admitting: Family Medicine

## 2019-11-06 ENCOUNTER — Other Ambulatory Visit: Payer: Self-pay

## 2019-11-06 ENCOUNTER — Ambulatory Visit: Payer: Federal, State, Local not specified - PPO | Admitting: Family Medicine

## 2019-11-06 ENCOUNTER — Encounter: Payer: Self-pay | Admitting: Family Medicine

## 2019-11-06 VITALS — BP 138/82 | HR 54 | Temp 97.8°F | Resp 18 | Ht 69.0 in | Wt 143.2 lb

## 2019-11-06 DIAGNOSIS — I10 Essential (primary) hypertension: Secondary | ICD-10-CM

## 2019-11-06 DIAGNOSIS — E785 Hyperlipidemia, unspecified: Secondary | ICD-10-CM | POA: Diagnosis not present

## 2019-11-06 LAB — COMPREHENSIVE METABOLIC PANEL
ALT: 5 U/L (ref 0–53)
AST: 12 U/L (ref 0–37)
Albumin: 3.6 g/dL (ref 3.5–5.2)
Alkaline Phosphatase: 57 U/L (ref 39–117)
BUN: 24 mg/dL — ABNORMAL HIGH (ref 6–23)
CO2: 26 mEq/L (ref 19–32)
Calcium: 8.8 mg/dL (ref 8.4–10.5)
Chloride: 107 mEq/L (ref 96–112)
Creatinine, Ser: 1.71 mg/dL — ABNORMAL HIGH (ref 0.40–1.50)
GFR: 46.18 mL/min — ABNORMAL LOW (ref >60.00)
Glucose, Bld: 86 mg/dL (ref 70–99)
Potassium: 4.1 mEq/L (ref 3.5–5.1)
Sodium: 140 mEq/L (ref 135–145)
Total Bilirubin: 1.2 mg/dL (ref 0.2–1.2)
Total Protein: 6.7 g/dL (ref 6.0–8.3)

## 2019-11-06 LAB — LIPID PANEL
Cholesterol: 183 mg/dL (ref 0–200)
HDL: 44 mg/dL (ref >39.00)
LDL Cholesterol: 121 mg/dL — ABNORMAL HIGH (ref 0–99)
NonHDL: 138.85
Total CHOL/HDL Ratio: 4
Triglycerides: 87 mg/dL (ref 0.0–149.0)
VLDL: 17.4 mg/dL (ref 0.0–40.0)

## 2019-11-06 MED ORDER — DILTIAZEM HCL ER COATED BEADS 420 MG PO TB24: 420.0000 mg | ORAL_TABLET | Freq: Every day | ORAL | 1 refills | Status: DC

## 2019-11-06 MED ORDER — CLONIDINE HCL 0.1 MG PO TABS: 0.1000 mg | ORAL_TABLET | Freq: Two times a day (BID) | ORAL | 1 refills | Status: DC

## 2019-11-06 MED ORDER — METOPROLOL SUCCINATE ER 25 MG PO TB24: ORAL_TABLET | ORAL | 1 refills | Status: DC

## 2019-11-06 NOTE — Progress Notes (Signed)
Patient ID: [name] [name], male    DOB: 14-May-1932  Age: 86 y.o. MRN: [id number]    Subjective:  Subjective  HPI [name] [name] presents for f/u chol and bp.  No complaints   His wife is with him  Review of Systems  Constitutional: Negative for appetite change, diaphoresis, fatigue and unexpected weight change.  Eyes: Negative for pain, redness and visual disturbance.  Respiratory: Negative for cough, chest tightness, shortness of breath and wheezing.   Cardiovascular: Negative for chest pain, palpitations and leg swelling.  Endocrine: Negative for cold intolerance, heat intolerance, polydipsia, polyphagia and polyuria.  Genitourinary: Negative for difficulty urinating, dysuria and frequency.  Neurological: Negative for dizziness, light-headedness, numbness and headaches.    History Past Medical History:  Diagnosis Date  . Abnormal EKG   . CAD in native artery    Nonobstructive 2005  . Elevated PSA   . Glaucoma   . Hearing impairment   . Hyperlipidemia   . Hypertension   . Prostatic hypertrophy, benign   . PSA elevation   . Renal insufficiency   . Routine general medical examination at a health care facility     He has a past surgical history that includes Transurethral resection of prostate; Cholecystectomy (03/2010); Colonoscopy; Tonsilectomy, adenoidectomy, bilateral myringotomy and tubes; removal of cyst; Shoulder surgery; Bladder surgery; and Cataract extraction.   His family history includes Alzheimer's disease in his father; Coronary artery disease in his mother; Diabetes in his sister; Heart disease in his mother; Heart disease (age of onset: 55) in his sister; Hypertension in his daughter, mother, and son.He reports that he quit smoking about 26 years ago. His smoking use included cigarettes. He has a 6.00 pack-year smoking history. He has never used smokeless tobacco. He reports current drug use. Drug: Marijuana. He reports that he does not drink alcohol.  Current  Outpatient Medications on File Prior to Visit  Medication Sig Dispense Refill  . atorvastatin (LIPITOR) 40 MG tablet Take 1 tablet (40 mg total) by mouth daily. 30 tablet 2  . dorzolamide-timolol (COSOPT) 22.3-6.8 MG/ML ophthalmic solution 1 drop 2 (two) times daily.       No current facility-administered medications on file prior to visit.     Objective:  Objective  Physical Exam Vitals and nursing note reviewed.  Constitutional:      General: He is sleeping.     Appearance: He is well-developed.  HENT:     Head: Normocephalic and atraumatic.  Eyes:     Pupils: Pupils are equal, round, and reactive to light.  Neck:     Thyroid: No thyromegaly.  Cardiovascular:     Rate and Rhythm: Normal rate and regular rhythm.     Heart sounds: No murmur.  Pulmonary:     Effort: Pulmonary effort is normal. No respiratory distress.     Breath sounds: Normal breath sounds. No wheezing or rales.  Chest:     Chest wall: No tenderness.  Musculoskeletal:        General: No tenderness.     Cervical back: Normal range of motion and neck supple.  Skin:    General: Skin is warm and dry.  Neurological:     Mental Status: He is oriented to person, place, and time.  Psychiatric:        Behavior: Behavior normal.        Thought Content: Thought content normal.        Judgment: Judgment normal.    BP 138/82 (BP Location: Right  Arm, Patient Position: Sitting, Cuff Size: Normal)   Pulse (!) 54   Temp 97.8 F (36.6 C) (Temporal)   Resp 18   Ht 5\' 9"  (1.753 m)   Wt 143 lb 3.2 oz (65 kg)   SpO2 99%   BMI 21.15 kg/m  Wt Readings from Last 3 Encounters:  11/06/19 143 lb 3.2 oz (65 kg)  05/01/19 143 lb 9.6 oz (65.1 kg)  04/23/19 148 lb (67.1 kg)     Lab Results  Component Value Date   WBC 6.9 05/01/2019   HGB 12.2 (L) 05/01/2019   HCT 37.0 (L) 05/01/2019   PLT 190.0 05/01/2019   GLUCOSE 89 05/01/2019   CHOL 166 05/01/2019   TRIG 109.0 05/01/2019   HDL 38.80 (L) 05/01/2019   LDLDIRECT  170.2 10/09/2007   LDLCALC 106 (H) 05/01/2019   ALT 7 05/01/2019   AST 14 05/01/2019   NA 139 05/01/2019   K 3.7 05/01/2019   CL 106 05/01/2019   CREATININE 1.81 (H) 05/01/2019   BUN 27 (H) 05/01/2019   CO2 26 05/01/2019   TSH 1.71 05/01/2019   PSA 31.37 (H) 04/06/2016   HGBA1C 5.1 04/02/2013   MICROALBUR 9.4 (H) 10/06/2015    DG Cholangiogram Operative  Result Date: 06/30/2010 Clinical Data:   Gallstones.  INTRAOPERATIVE CHOLANGIOGRAM  Comparison: Ultrasound 05/22/2010  Findings: Intraoperative spot images show normal caliber biliary system.  No evidence of retained stone or obstruction.  Free passage of contrast into the small bowel noted.  IMPRESSION: No evidence of retained stone or obstruction.  These images were submitted for radiologic interpretation only. Please see the procedural report for the amount of contrast and the fluoroscopy time utilized. Provider: McBride:  Plan  I have discontinued [name] [name]'s latanoprost and Rhopressa. I have changed his Matzim LA to diltiazem. I have also changed his cloNIDine. Additionally, I am having him maintain his dorzolamide-timolol, atorvastatin, and metoprolol succinate.  Meds ordered this encounter  Medications  . metoprolol succinate (TOPROL-XL) 25 MG 24 hr tablet    Sig: 1/2 tab po qd    Dispense:  90 tablet    Refill:  1  . diltiazem (MATZIM LA) 420 MG 24 hr tablet    Sig: Take 1 tablet (420 mg total) by mouth daily.    Dispense:  90 tablet    Refill:  1  . cloNIDine (CATAPRES) 0.1 MG tablet    Sig: Take 1 tablet (0.1 mg total) by mouth 2 (two) times daily.    Dispense:  180 tablet    Refill:  1    Problem List Items Addressed This Visit      Unprioritized   Essential hypertension    Well controlled, no changes to meds. Encouraged heart healthy diet such as the DASH diet and exercise as tolerated.       Relevant Medications   metoprolol succinate (TOPROL-XL) 25 MG 24 hr tablet    diltiazem (MATZIM LA) 420 MG 24 hr tablet   cloNIDine (CATAPRES) 0.1 MG tablet   Other Relevant Orders   Lipid panel   Comprehensive metabolic panel   Hyperlipidemia LDL goal <100    Tolerating statin, encouraged heart healthy diet, avoid trans fats, minimize simple carbs and saturated fats. Increase exercise as tolerated      Relevant Medications   metoprolol succinate (TOPROL-XL) 25 MG 24 hr tablet   diltiazem (MATZIM LA) 420 MG 24 hr tablet   cloNIDine (CATAPRES) 0.1 MG tablet  Other Visit Diagnoses    Hyperlipidemia, unspecified hyperlipidemia type    -  Primary   Relevant Medications   metoprolol succinate (TOPROL-XL) 25 MG 24 hr tablet   diltiazem (MATZIM LA) 420 MG 24 hr tablet   cloNIDine (CATAPRES) 0.1 MG tablet   Other Relevant Orders   Lipid panel   Comprehensive metabolic panel      Follow-up: Return in about 6 months (around 05/05/2020), or if symptoms worsen or fail to improve, for annual exam, fasting.  [name], DO

## 2019-11-06 NOTE — Patient Instructions (Addendum)
COVID-19 Vaccine Information can be found at: [email or web address] For questions related to vaccine distribution or appointments, please email [email or web address] or call [phone].        DASH Eating Plan DASH stands for "Dietary Approaches to Stop Hypertension." The DASH eating plan is a healthy eating plan that has been shown to reduce high blood pressure (hypertension). It may also reduce your risk for type 2 diabetes, heart disease, and stroke. The DASH eating plan may also help with weight loss. What are tips for following this plan?  General guidelines  Avoid eating more than 2,300 mg (milligrams) of salt (sodium) a day. If you have hypertension, you may need to reduce your sodium intake to 1,500 mg a day.  Limit alcohol intake to no more than 1 drink a day for nonpregnant women and 2 drinks a day for men. One drink equals 12 oz of beer, 5 oz of wine, or 1 oz of hard liquor.  Work with your health care provider to maintain a healthy body weight or to lose weight. Ask what an ideal weight is for you.  Get at least 30 minutes of exercise that causes your heart to beat faster (aerobic exercise) most days of the week. Activities may include walking, swimming, or biking.  Work with your health care provider or diet and nutrition specialist (dietitian) to adjust your eating plan to your individual calorie needs. Reading food labels   Check food labels for the amount of sodium per serving. Choose foods with less than 5 percent of the Daily Value of sodium. Generally, foods with less than 300 mg of sodium per serving fit into this eating plan.  To find whole grains, look for the word "whole" as the first word in the ingredient list. Shopping  Buy products labeled as "low-sodium" or "no salt added."  Buy fresh foods. Avoid canned foods and premade or frozen meals. Cooking  Avoid adding salt when cooking. Use salt-free  seasonings or herbs instead of table salt or sea salt. Check with your health care provider or pharmacist before using salt substitutes.  Do not fry foods. Cook foods using healthy methods such as baking, boiling, grilling, and broiling instead.  Cook with heart-healthy oils, such as olive, canola, soybean, or sunflower oil. Meal planning  Eat a balanced diet that includes: ? 5 or more servings of fruits and vegetables each day. At each meal, try to fill half of your plate with fruits and vegetables. ? Up to 6-8 servings of whole grains each day. ? Less than 6 oz of lean meat, poultry, or fish each day. A 3-oz serving of meat is about the same size as a deck of cards. One egg equals 1 oz. ? 2 servings of low-fat dairy each day. ? A serving of nuts, seeds, or beans 5 times each week. ? Heart-healthy fats. Healthy fats called Omega-3 fatty acids are found in foods such as flaxseeds and coldwater fish, like sardines, salmon, and mackerel.  Limit how much you eat of the following: ? Canned or prepackaged foods. ? Food that is high in trans fat, such as fried foods. ? Food that is high in saturated fat, such as fatty meat. ? Sweets, desserts, sugary drinks, and other foods with added sugar. ? Full-fat dairy products.  Do not salt foods before eating.  Try to eat at least 2 vegetarian meals each week.  Eat more home-cooked food and less restaurant, buffet, and fast food.  When eating at a restaurant, ask that your  food be prepared with less salt or no salt, if possible. What foods are recommended? The items listed may not be a complete list. Talk with your dietitian about what dietary choices are best for you. Grains Whole-grain or whole-wheat bread. Whole-grain or whole-wheat pasta. Brown rice. [place]. Bulgur. Whole-grain and low-sodium cereals. Pita bread. Low-fat, low-sodium crackers. Whole-wheat flour tortillas. Vegetables Fresh or frozen vegetables (raw, steamed, roasted, or  grilled). Low-sodium or reduced-sodium tomato and vegetable juice. Low-sodium or reduced-sodium tomato sauce and tomato paste. Low-sodium or reduced-sodium canned vegetables. Fruits All fresh, dried, or frozen fruit. Canned fruit in natural juice (without added sugar). Meat and other protein foods Skinless chicken or [place]. Ground chicken or [place]. Pork with fat trimmed off. Fish and seafood. Egg whites. Dried beans, peas, or lentils. Unsalted nuts, nut butters, and seeds. Unsalted canned beans. Lean cuts of beef with fat trimmed off. Low-sodium, lean deli meat. Dairy Low-fat (1%) or fat-free (skim) milk. Fat-free, low-fat, or reduced-fat cheeses. Nonfat, low-sodium ricotta or cottage cheese. Low-fat or nonfat yogurt. Low-fat, low-sodium cheese. Fats and oils Soft margarine without trans fats. Vegetable oil. Low-fat, reduced-fat, or light mayonnaise and salad dressings (reduced-sodium). Canola, safflower, olive, soybean, and sunflower oils. Avocado. Seasoning and other foods Herbs. Spices. Seasoning mixes without salt. Unsalted popcorn and pretzels. Fat-free sweets. What foods are not recommended? The items listed may not be a complete list. Talk with your dietitian about what dietary choices are best for you. Grains Baked goods made with fat, such as croissants, muffins, or some breads. Dry pasta or rice meal packs. Vegetables Creamed or fried vegetables. Vegetables in a cheese sauce. Regular canned vegetables (not low-sodium or reduced-sodium). Regular canned tomato sauce and paste (not low-sodium or reduced-sodium). Regular tomato and vegetable juice (not low-sodium or reduced-sodium). Angie Fava. Olives. Fruits Canned fruit in a light or heavy syrup. Fried fruit. Fruit in cream or butter sauce. Meat and other protein foods Fatty cuts of meat. Ribs. Fried meat. Berniece Salines. Sausage. Bologna and other processed lunch meats. Salami. Fatback. Hotdogs. Bratwurst. Salted nuts and seeds. Canned beans with  added salt. Canned or smoked fish. Whole eggs or egg yolks. Chicken or [place] with skin. Dairy Whole or 2% milk, cream, and half-and-half. Whole or full-fat cream cheese. Whole-fat or sweetened yogurt. Full-fat cheese. Nondairy creamers. Whipped toppings. Processed cheese and cheese spreads. Fats and oils Butter. Stick margarine. Lard. Shortening. Ghee. Bacon fat. Tropical oils, such as coconut, palm kernel, or palm oil. Seasoning and other foods Salted popcorn and pretzels. Onion salt, garlic salt, seasoned salt, table salt, and sea salt. Worcestershire sauce. Tartar sauce. Barbecue sauce. Teriyaki sauce. Soy sauce, including reduced-sodium. Steak sauce. Canned and packaged gravies. Fish sauce. Oyster sauce. Cocktail sauce. Horseradish that you find on the shelf. Ketchup. Mustard. Meat flavorings and tenderizers. Bouillon cubes. Hot sauce and Tabasco sauce. Premade or packaged marinades. Premade or packaged taco seasonings. Relishes. Regular salad dressings. Where to find more information:  National Heart, Lung, and [place]: https://wilson-eaton.com/  American Heart Association: www.heart.org Summary  The DASH eating plan is a healthy eating plan that has been shown to reduce high blood pressure (hypertension). It may also reduce your risk for type 2 diabetes, heart disease, and stroke.  With the DASH eating plan, you should limit salt (sodium) intake to 2,300 mg a day. If you have hypertension, you may need to reduce your sodium intake to 1,500 mg a day.  When on the DASH eating plan, aim to eat more fresh fruits and vegetables,  whole grains, lean proteins, low-fat dairy, and heart-healthy fats.  Work with your health care provider or diet and nutrition specialist (dietitian) to adjust your eating plan to your individual calorie needs. This information is not intended to replace advice given to you by your health care provider. Make sure you discuss any questions you have with your health care  provider. Document Revised: 08/12/2017 Document Reviewed: 08/23/2016 Elsevier Patient Education  2020 [employer].

## 2019-11-06 NOTE — Assessment & Plan Note (Signed)
Tolerating statin, encouraged heart healthy diet, avoid trans fats, minimize simple carbs and saturated fats. Increase exercise as tolerated 

## 2019-11-06 NOTE — Assessment & Plan Note (Signed)
Well controlled, no changes to meds. Encouraged heart healthy diet such as the DASH diet and exercise as tolerated.  °

## 2019-11-09 ENCOUNTER — Ambulatory Visit: Payer: Federal, State, Local not specified - PPO | Attending: Internal Medicine

## 2019-11-09 DIAGNOSIS — Z23 Encounter for immunization: Secondary | ICD-10-CM

## 2019-11-09 NOTE — Progress Notes (Signed)
   Covid-19 Vaccination Clinic  Name:  [name] [name]    MRN: [id number] DOB: 04-21-1932  11/09/2019  Mr. [name] was observed post Covid-19 immunization for 15 minutes without incidence. He was provided with Vaccine Information Sheet and instruction to access the V-Safe system.   Mr. [name] was instructed to call 911 with any severe reactions post vaccine: Marland Kitchen Difficulty breathing  . Swelling of your face and throat  . A fast heartbeat  . A bad rash all over your body  . Dizziness and weakness    Immunizations Administered    Name Date Dose VIS Date Route   Pfizer COVID-19 Vaccine 11/09/2019 12:21 PM 0.3 mL 08/24/2019 Intramuscular   Manufacturer: [place]   Lot: [id number]   [place]: [id number]

## 2019-12-05 ENCOUNTER — Ambulatory Visit: Payer: Federal, State, Local not specified - PPO | Attending: Internal Medicine

## 2019-12-05 DIAGNOSIS — Z23 Encounter for immunization: Secondary | ICD-10-CM

## 2019-12-05 NOTE — Progress Notes (Signed)
   Covid-19 Vaccination Clinic  Name:  [name] [name]    MRN: [id number] DOB: 13-Jan-1932  12/05/2019  [name] was observed post Covid-19 immunization for 15 minutes without incident. He was provided with Vaccine Information Sheet and instruction to access the V-Safe system.   [name] was instructed to call 911 with any severe reactions post vaccine: Marland Kitchen Difficulty breathing  . Swelling of face and throat  . A fast heartbeat  . A bad rash all over body  . Dizziness and weakness   Immunizations Administered    Name Date Dose VIS Date Route   Pfizer COVID-19 Vaccine 12/05/2019  9:02 AM 0.3 mL 08/24/2019 Intramuscular   Manufacturer: [place]   Lot: [id number]   [place]: [id number]

## 2020-02-04 ENCOUNTER — Other Ambulatory Visit: Payer: Self-pay | Admitting: Family Medicine

## 2020-02-04 DIAGNOSIS — R413 Other amnesia: Secondary | ICD-10-CM

## 2020-05-05 ENCOUNTER — Other Ambulatory Visit: Payer: Self-pay

## 2020-05-05 ENCOUNTER — Ambulatory Visit (INDEPENDENT_AMBULATORY_CARE_PROVIDER_SITE_OTHER): Payer: Federal, State, Local not specified - PPO | Admitting: Family Medicine

## 2020-05-05 ENCOUNTER — Encounter: Payer: Self-pay | Admitting: Family Medicine

## 2020-05-05 VITALS — BP 142/90 | HR 60 | Temp 98.6°F | Resp 18 | Ht 69.0 in | Wt 136.0 lb

## 2020-05-05 DIAGNOSIS — I2510 Atherosclerotic heart disease of native coronary artery without angina pectoris: Secondary | ICD-10-CM

## 2020-05-05 DIAGNOSIS — R634 Abnormal weight loss: Secondary | ICD-10-CM | POA: Diagnosis not present

## 2020-05-05 DIAGNOSIS — E785 Hyperlipidemia, unspecified: Secondary | ICD-10-CM

## 2020-05-05 DIAGNOSIS — I10 Essential (primary) hypertension: Secondary | ICD-10-CM

## 2020-05-05 DIAGNOSIS — N400 Enlarged prostate without lower urinary tract symptoms: Secondary | ICD-10-CM | POA: Diagnosis not present

## 2020-05-05 DIAGNOSIS — Z0000 Encounter for general adult medical examination without abnormal findings: Secondary | ICD-10-CM | POA: Diagnosis not present

## 2020-05-05 NOTE — Assessment & Plan Note (Signed)
Pt will start back on boost / ensure

## 2020-05-05 NOTE — Assessment & Plan Note (Signed)
Well controlled, no changes to meds. Encouraged heart healthy diet such as the DASH diet and exercise as tolerated.  °

## 2020-05-05 NOTE — Patient Instructions (Signed)

## 2020-05-05 NOTE — Assessment & Plan Note (Signed)
Per cardiology 

## 2020-05-05 NOTE — Assessment & Plan Note (Signed)
Encouraged heart healthy diet, increase exercise, avoid trans fats, consider a krill oil cap daily 

## 2020-05-05 NOTE — Assessment & Plan Note (Signed)
Per u rology 

## 2020-05-05 NOTE — Progress Notes (Signed)
Patient ID: [name] [name], male    DOB: 19-Mar-1932  Age: 86 y.o. MRN: [id number]    Subjective:  Subjective  HPI [name] [name] presents for cpe --- his wife is with him He is losing weight ---- he stopped drinking the ensure/ boost    While he was drinking it he was doing well with his weight.   No other complaints  ROS   Review of Systems  Constitutional: Negative for activity change, appetite change and fatigue.  HENT: Negative for hearing loss, congestion, tinnitus and ear discharge.   Eyes: Negative for visual disturbance (see optho q1y -- vision corrected to 20/20 with glasses).  Respiratory: Negative for cough, chest tightness and shortness of breath.   Cardiovascular: Negative for chest pain, palpitations and leg swelling.  Gastrointestinal: Negative for abdominal pain, diarrhea, constipation and abdominal distention.  Genitourinary: Negative for urgency, frequency, decreased urine volume and difficulty urinating.  Musculoskeletal: Negative for [name] pain, arthralgias and gait problem.  Skin: Negative for color change, pallor and rash.  Neurological: Negative for dizziness, light-headedness, numbness and headaches.  Hematological: Negative for adenopathy. Does not bruise/bleed easily.  Psychiatric/Behavioral: Negative for suicidal ideas, confusion, sleep disturbance, self-injury, dysphoric mood, decreased concentration and agitation.  Pt is able to read and write and can do all ADLs No risk for falling No abuse/ violence in home     History Past Medical History:  Diagnosis Date  . Abnormal EKG   . CAD in native artery    Nonobstructive 2005  . Elevated PSA   . Glaucoma   . Hearing impairment   . Hyperlipidemia   . Hypertension   . Prostatic hypertrophy, benign   . PSA elevation   . Renal insufficiency   . Routine general medical examination at a health care facility     He has a past surgical history that includes Transurethral resection of prostate;  Cholecystectomy (03/2010); Colonoscopy; Tonsilectomy, adenoidectomy, bilateral myringotomy and tubes; removal of cyst; Shoulder surgery; Bladder surgery; and Cataract extraction.   His family history includes Alzheimer's disease in his father; Coronary artery disease in his mother; Diabetes in his sister; Heart disease in his mother; Heart disease (age of onset: 66) in his sister; Hypertension in his daughter, mother, and son.He reports that he quit smoking about 27 years ago. His smoking use included cigarettes. He has a 6.00 pack-year smoking history. He has never used smokeless tobacco. He reports current drug use. Drug: Marijuana. He reports that he does not drink alcohol.  Current Outpatient Medications on File Prior to Visit  Medication Sig Dispense Refill  . atorvastatin (LIPITOR) 40 MG tablet Take 1 tablet (40 mg total) by mouth daily. 30 tablet 2  . cloNIDine (CATAPRES) 0.1 MG tablet Take 1 tablet (0.1 mg total) by mouth 2 (two) times daily. 180 tablet 1  . diltiazem (MATZIM LA) 420 MG 24 hr tablet Take 1 tablet (420 mg total) by mouth daily. 90 tablet 1  . dorzolamide-timolol (COSOPT) 22.3-6.8 MG/ML ophthalmic solution 1 drop 2 (two) times daily.      . metoprolol succinate (TOPROL-XL) 25 MG 24 hr tablet 1/2 tab po qd 90 tablet 1   No current facility-administered medications on file prior to visit.      Health Maintenance  Topic Date Due  . PNA vac Low Risk Adult (1 of 2 - PCV13) Never done  . TETANUS/TDAP  02/23/2015  . INFLUENZA VACCINE  04/13/2020  . COVID-19 Vaccine  Completed    Objective:  Objective  Physical Exam Vitals and nursing note reviewed.  Constitutional:      General: He is sleeping. He is not in acute distress.    Appearance: He is well-developed. He is not diaphoretic.  HENT:     Head: Normocephalic and atraumatic.     Right Ear: External ear normal.     Left Ear: External ear normal.     Nose: Nose normal.     Mouth/Throat:     Pharynx: No  oropharyngeal exudate.  Eyes:     General:        Right eye: No discharge.        Left eye: No discharge.     Conjunctiva/sclera: Conjunctivae normal.     Pupils: Pupils are equal, round, and reactive to light.  Neck:     Thyroid: No thyromegaly.     Vascular: No JVD.  Cardiovascular:     Rate and Rhythm: Normal rate and regular rhythm.     Heart sounds: No murmur heard.  No friction rub. No gallop.   Pulmonary:     Effort: Pulmonary effort is normal. No respiratory distress.     Breath sounds: Normal breath sounds. No wheezing or rales.  Chest:     Chest wall: No tenderness.  Abdominal:     General: Abdomen is flat. Bowel sounds are normal. There is no distension.     Palpations: Abdomen is soft. There is no mass.     Tenderness: There is no abdominal tenderness. There is no guarding or rebound.  Genitourinary:    Comments: urology Musculoskeletal:        General: No tenderness. Normal range of motion.     Cervical [name]: Normal range of motion and neck supple.  Lymphadenopathy:     Cervical: No cervical adenopathy.  Skin:    General: Skin is warm and dry.     Coloration: Skin is not pale.     Findings: No erythema or rash.  Neurological:     Mental Status: He is oriented to person, place, and time.     Motor: No abnormal muscle tone.     Deep Tendon Reflexes: Reflexes are normal and symmetric. Reflexes normal.  Psychiatric:        Behavior: Behavior normal.        Thought Content: Thought content normal.        Judgment: Judgment normal.    BP (!) 142/90 (BP Location: Right Arm, Patient Position: Sitting, Cuff Size: Normal)   Pulse 60   Temp 98.6 F (37 C) (Oral)   Resp 18   Ht 5\' 9"  (1.753 m)   Wt 136 lb (61.7 kg)   SpO2 98%   BMI 20.08 kg/m  Wt Readings from Last 3 Encounters:  05/05/20 136 lb (61.7 kg)  11/06/19 143 lb 3.2 oz (65 kg)  05/01/19 143 lb 9.6 oz (65.1 kg)     Lab Results  Component Value Date   WBC 6.9 05/01/2019   HGB 12.2 (L)  05/01/2019   HCT 37.0 (L) 05/01/2019   PLT 190.0 05/01/2019   GLUCOSE 86 11/06/2019   CHOL 183 11/06/2019   TRIG 87.0 11/06/2019   HDL 44.00 11/06/2019   LDLDIRECT 170.2 10/09/2007   LDLCALC 121 (H) 11/06/2019   ALT 5 11/06/2019   AST 12 11/06/2019   NA 140 11/06/2019   K 4.1 11/06/2019   CL 107 11/06/2019   CREATININE 1.71 (H) 11/06/2019   BUN 24 (H) 11/06/2019   CO2 26 11/06/2019  TSH 1.71 05/01/2019   PSA 31.37 (H) 04/06/2016   HGBA1C 5.1 04/02/2013   MICROALBUR 9.4 (H) 10/06/2015    DG Cholangiogram Operative  Result Date: 06/30/2010 Clinical Data:   Gallstones.  INTRAOPERATIVE CHOLANGIOGRAM  Comparison: Ultrasound 05/22/2010  Findings: Intraoperative spot images show normal caliber biliary system.  No evidence of retained stone or obstruction.  Free passage of contrast into the small bowel noted.  IMPRESSION: No evidence of retained stone or obstruction.  These images were submitted for radiologic interpretation only. Please see the procedural report for the amount of contrast and the fluoroscopy time utilized. Provider: [place]:  Plan  I am having [name] [name] maintain his dorzolamide-timolol, atorvastatin, metoprolol succinate, diltiazem, and cloNIDine.  No orders of the defined types were placed in this encounter.   Problem List Items Addressed This Visit      Unprioritized   BPH without obstruction/lower urinary tract symptoms    Per urology      CAD, NATIVE VESSEL    Per cardiology      Essential hypertension    Well controlled, no changes to meds. Encouraged heart healthy diet such as the DASH diet and exercise as tolerated.       Relevant Orders   Comprehensive metabolic panel   Lipid panel   Hyperlipidemia LDL goal <100    Encouraged heart healthy diet, increase exercise, avoid trans fats, consider a krill oil cap daily      Preventative health care - Primary    ghm utd Check labs  See AVS      Weight loss  observed on examination    Pt will start [name] on boost / ensure        Other Visit Diagnoses    Dyslipidemia       Relevant Orders   Comprehensive metabolic panel   Lipid panel      Follow-up: Return in about 3 months (around 08/05/2020), or if symptoms worsen or fail to improve, for weight check     pt also wants to have mole removal --- R shoulder.  [name], DO

## 2020-05-05 NOTE — Assessment & Plan Note (Signed)
ghm utd Check labs See AVS 

## 2020-05-06 LAB — COMPREHENSIVE METABOLIC PANEL
AG Ratio: 1.2 (calc) (ref 1.0–2.5)
ALT: 4 U/L — ABNORMAL LOW (ref 9–46)
AST: 10 U/L (ref 10–35)
Albumin: 3.5 g/dL — ABNORMAL LOW (ref 3.6–5.1)
Alkaline phosphatase (APISO): 53 U/L (ref 35–144)
BUN/Creatinine Ratio: 14 (calc) (ref 6–22)
BUN: 24 mg/dL (ref 7–25)
CO2: 25 mmol/L (ref 20–32)
Calcium: 8.5 mg/dL — ABNORMAL LOW (ref 8.6–10.3)
Chloride: 109 mmol/L (ref 98–110)
Creat: 1.71 mg/dL — ABNORMAL HIGH (ref 0.70–1.11)
Globulin: 3 g/dL (calc) (ref 1.9–3.7)
Glucose, Bld: 82 mg/dL (ref 65–99)
Potassium: 4.1 mmol/L (ref 3.5–5.3)
Sodium: 141 mmol/L (ref 135–146)
Total Bilirubin: 0.6 mg/dL (ref 0.2–1.2)
Total Protein: 6.5 g/dL (ref 6.1–8.1)

## 2020-05-06 LAB — LIPID PANEL
Cholesterol: 172 mg/dL (ref <200)
HDL: 43 mg/dL (ref >=40)
LDL Cholesterol (Calc): 107 mg/dL (calc) — ABNORMAL HIGH
Non-HDL Cholesterol (Calc): 129 mg/dL (calc) (ref <130)
Total CHOL/HDL Ratio: 4 (calc) (ref <5.0)
Triglycerides: 119 mg/dL (ref <150)

## 2020-06-28 ENCOUNTER — Ambulatory Visit: Payer: Federal, State, Local not specified - PPO | Attending: Internal Medicine

## 2020-06-28 DIAGNOSIS — Z23 Encounter for immunization: Secondary | ICD-10-CM

## 2020-06-28 NOTE — Progress Notes (Signed)
   Covid-19 Vaccination Clinic  Name:  [name] [name]    MRN: [id number] DOB: 1931/10/20  06/28/2020  [name] was observed post Covid-19 immunization for 15 minutes without incident. He was provided with Vaccine Information Sheet and instruction to access the V-Safe system.   [name] was instructed to call 911 with any severe reactions post vaccine: Marland Kitchen Difficulty breathing  . Swelling of face and throat  . A fast heartbeat  . A bad rash all over body  . Dizziness and weakness

## 2020-08-04 ENCOUNTER — Encounter: Payer: Self-pay | Admitting: Family Medicine

## 2020-08-04 ENCOUNTER — Ambulatory Visit: Payer: Federal, State, Local not specified - PPO | Admitting: Family Medicine

## 2020-08-04 ENCOUNTER — Other Ambulatory Visit: Payer: Self-pay

## 2020-08-04 VITALS — BP 110/80 | HR 60 | Temp 97.8°F | Resp 18 | Ht 69.0 in | Wt 130.2 lb

## 2020-08-04 DIAGNOSIS — R634 Abnormal weight loss: Secondary | ICD-10-CM | POA: Diagnosis not present

## 2020-08-04 DIAGNOSIS — R413 Other amnesia: Secondary | ICD-10-CM | POA: Diagnosis not present

## 2020-08-04 DIAGNOSIS — L918 Other hypertrophic disorders of the skin: Secondary | ICD-10-CM | POA: Diagnosis not present

## 2020-08-04 NOTE — Progress Notes (Signed)
Patient ID: [name] [name], male    DOB: [birth year]  Age: 86 y.o. MRN: [id number]    Subjective:  Subjective  HPI [name] [name] presents for with his wife to discuss his weight loss--- his wife states he might eat 1 meal a day He never went to neuro psych/ or neuro   He has lost 6 more lbs since last ov He would also like a skin tag removed.  He itches a lot and is irritated but clothes  Review of Systems  Constitutional: Negative for appetite change, diaphoresis, fatigue and unexpected weight change.  Eyes: Negative for pain, redness and visual disturbance.  Respiratory: Negative for cough, chest tightness, shortness of breath and wheezing.   Cardiovascular: Negative for chest pain, palpitations and leg swelling.  Endocrine: Negative for cold intolerance, heat intolerance, polydipsia, polyphagia and polyuria.  Genitourinary: Negative for difficulty urinating, dysuria and frequency.  Neurological: Negative for dizziness, light-headedness, numbness and headaches.    History Past Medical History:  Diagnosis Date  . Abnormal EKG   . CAD in native artery    Nonobstructive 2005  . Elevated PSA   . Glaucoma   . Hearing impairment   . Hyperlipidemia   . Hypertension   . Prostatic hypertrophy, benign   . PSA elevation   . Renal insufficiency   . Routine general medical examination at a health care facility     He has a past surgical history that includes Transurethral resection of prostate; Cholecystectomy (03/2010); Colonoscopy; Tonsilectomy, adenoidectomy, bilateral myringotomy and tubes; removal of cyst; Shoulder surgery; Bladder surgery; and Cataract extraction.   His family history includes Alzheimer's disease in his father; Coronary artery disease in his mother; Diabetes in his sister; Heart disease in his mother; Heart disease (age of onset: 46) in his sister; Hypertension in his daughter, mother, and son.He reports that he quit smoking about 27 years ago. His smoking use  included cigarettes. He has a 6.00 pack-year smoking history. He has never used smokeless tobacco. He reports current drug use. Drug: Marijuana. He reports that he does not drink alcohol.  Current Outpatient Medications on File Prior to Visit  Medication Sig Dispense Refill  . atorvastatin (LIPITOR) 40 MG tablet Take 1 tablet (40 mg total) by mouth daily. 30 tablet 2  . cloNIDine (CATAPRES) 0.1 MG tablet Take 1 tablet (0.1 mg total) by mouth 2 (two) times daily. 180 tablet 1  . diltiazem (MATZIM LA) 420 MG 24 hr tablet Take 1 tablet (420 mg total) by mouth daily. 90 tablet 1  . dorzolamide-timolol (COSOPT) 22.3-6.8 MG/ML ophthalmic solution 1 drop 2 (two) times daily.      . metoprolol succinate (TOPROL-XL) 25 MG 24 hr tablet 1/2 tab po qd 90 tablet 1   No current facility-administered medications on file prior to visit.     Objective:  Objective  Physical Exam Constitutional:      General: He is sleeping.     Appearance: He is well-developed.  HENT:     Head: Normocephalic and atraumatic.  Eyes:     Pupils: Pupils are equal, round, and reactive to light.  Neck:     Thyroid: No thyromegaly.  Cardiovascular:     Rate and Rhythm: Normal rate and regular rhythm.     Heart sounds: No murmur heard.   Pulmonary:     Effort: Pulmonary effort is normal. No respiratory distress.     Breath sounds: Normal breath sounds. No wheezing or rales.  Chest:  Chest wall: No tenderness.  Musculoskeletal:        General: No tenderness.     Cervical [name]: Normal range of motion and neck supple.  Skin:    General: Skin is warm and dry.  Neurological:     Mental Status: He is oriented to person, place, and time.  Psychiatric:        Behavior: Behavior normal.        Thought Content: Thought content normal.        Judgment: Judgment normal.    BP 110/80 (BP Location: Right Arm, Patient Position: Sitting, Cuff Size: Normal)   Pulse 60   Temp 97.8 F (36.6 C) (Oral)   Resp 18   Ht 5\' 9"   (1.753 m)   Wt 130 lb 3.2 oz (59.1 kg)   SpO2 97%   BMI 19.23 kg/m  Wt Readings from Last 3 Encounters:  08/04/20 130 lb 3.2 oz (59.1 kg)  05/05/20 136 lb (61.7 kg)  11/06/19 143 lb 3.2 oz (65 kg)     Lab Results  Component Value Date   WBC 6.9 05/01/2019   HGB 12.2 (L) 05/01/2019   HCT 37.0 (L) 05/01/2019   PLT 190.0 05/01/2019   GLUCOSE 82 05/05/2020   CHOL 172 05/05/2020   TRIG 119 05/05/2020   HDL 43 05/05/2020   LDLDIRECT 170.2 10/09/2007   LDLCALC 107 (H) 05/05/2020   ALT 4 (L) 05/05/2020   AST 10 05/05/2020   NA 141 05/05/2020   K 4.1 05/05/2020   CL 109 05/05/2020   CREATININE 1.71 (H) 05/05/2020   BUN 24 05/05/2020   CO2 25 05/05/2020   TSH 1.71 05/01/2019   PSA 31.37 (H) 04/06/2016   HGBA1C 5.1 04/02/2013   MICROALBUR 9.4 (H) 10/06/2015    DG Cholangiogram Operative  Result Date: 06/30/2010 Clinical Data:   Gallstones.  INTRAOPERATIVE CHOLANGIOGRAM  Comparison: Ultrasound 05/22/2010  Findings: Intraoperative spot images show normal caliber biliary system.  No evidence of retained stone or obstruction.  Free passage of contrast into the small bowel noted.  IMPRESSION: No evidence of retained stone or obstruction.  These images were submitted for radiologic interpretation only. Please see the procedural report for the amount of contrast and the fluoroscopy time utilized. Provider: [place]:  Plan  I am having [name] [name] maintain his dorzolamide-timolol, atorvastatin, metoprolol succinate, diltiazem, and cloNIDine.  No orders of the defined types were placed in this encounter.   Problem List Items Addressed This Visit      Unprioritized   Memory loss   Relevant Orders   MR Brain Wo Contrast   Ambulatory referral to Neuropsychology    Other Visit Diagnoses    Weight loss    -  Primary   Relevant Orders   Amb ref to Medical Nutrition Therapy-MNT   Skin tag        Skin Tag Removal Procedure Note Diagnosis:  pruritic skin tags Location: [name] Informed Consent: Discussed risks (permanent scarring, infection, pain, bleeding, bruising, redness, and recurrence of the lesion) and benefits of the procedure, as well as the alternatives. He is aware that skin tags are benign lesions, and their removal is often not considered medically necessary. Informed consent was obtained. Preparation: The area was prepared in a standard fashion. Anesthesia: Lidocaine 1% with epinephrine Procedure Details: scalpel  used to perform sharp removal. Aluminum chloride was applied for hemostasis. Ointment and bandage were applied where needed. The patient tolerated the procedure  well. Total number of lesions treated: 1 Plan: The patient was instructed on post-op care. Recommend OTC analgesia as needed for pain.   Follow-up: Return in about 3 months (around 11/04/2020), or if symptoms worsen or fail to improve, for weight loss, memory loss.  [name], DO

## 2020-08-04 NOTE — Patient Instructions (Signed)
Skin Tag, Adult  A skin tag (acrochordon) is a soft, extra growth of skin. Most skin tags are flesh-colored and rarely bigger than a pencil eraser. They commonly form near areas where there are folds in the skin, such as the armpit or groin. Skin tags are not dangerous, and they do not spread from person to person (are not contagious). You may have one skin tag or several. Skin tags do not require treatment. However, your health care provider may recommend removal of a skin tag if it:  Gets irritated from clothing.  Bleeds.  Is visible and unsightly. Your health care provider can remove skin tags with a simple surgical procedure or a procedure that involves freezing the skin tag. Follow these instructions at home:  Watch for any changes in your skin tag. A normal skin tag does not require any other special care at home.  Take over-the-counter and prescription medicines only as told by your health care provider.  Keep all follow-up visits as told by your health care provider. This is important. Contact a health care provider if:  You have a skin tag that: ? Becomes painful. ? Changes color. ? Bleeds. ? Swells.  You develop more skin tags. This information is not intended to replace advice given to you by your health care provider. Make sure you discuss any questions you have with your health care provider. Document Revised: 08/12/2017 Document Reviewed: 09/14/2015 Elsevier Patient Education  2020 [employer]  

## 2020-08-11 ENCOUNTER — Other Ambulatory Visit: Payer: Self-pay | Admitting: Family Medicine

## 2020-08-11 DIAGNOSIS — F321 Major depressive disorder, single episode, moderate: Secondary | ICD-10-CM

## 2020-08-11 MED ORDER — MIRTAZAPINE 15 MG PO TABS: 15.0000 mg | ORAL_TABLET | Freq: Every day | ORAL | 5 refills | Status: DC

## 2020-08-13 ENCOUNTER — Encounter: Payer: Self-pay | Admitting: Psychology

## 2020-08-15 ENCOUNTER — Telehealth: Payer: Self-pay

## 2020-08-15 NOTE — Telephone Encounter (Signed)
Please advise patient: Stop Remeron Recommend melatonin 4 mg (OTC): 2 tablets every night and discussed next week with PCP in more detail.

## 2020-08-15 NOTE — Telephone Encounter (Signed)
Please advise 

## 2020-08-15 NOTE — Telephone Encounter (Signed)
Pt's Spouse, Andarine, called to report she picked up script for Remeron for pt on Tuesday of this week and he took it Tuesday night.  It caused patient to be so sleepy and out of it he slept all day Wednesday and got up late Thursday afternoon.  He did not eat anything on Wednesday.  When he got up on Thursday, he had no appetite.  She said the medicine was to help him sleep and increase his appetite.  As of now, all he has had has been some bananas.  Please advise.

## 2020-08-16 NOTE — Telephone Encounter (Signed)
noted 

## 2020-08-18 NOTE — Telephone Encounter (Signed)
Spoke with pt's wife. She states pt has stopped the Remeron and only took the medication one time. She states pt is back to normal and has MRI on Wednesday

## 2020-08-18 NOTE — Telephone Encounter (Signed)
Noted  Kelvin Sennett R Lowne Chase, DO  

## 2020-08-20 ENCOUNTER — Ambulatory Visit (HOSPITAL_COMMUNITY)
Admission: RE | Admit: 2020-08-20 | Discharge: 2020-08-20 | Disposition: A | Discharge status: Home / Self Care | Payer: Federal, State, Local not specified - PPO | Source: Ambulatory Visit | Attending: Family Medicine | Admitting: Family Medicine

## 2020-08-20 ENCOUNTER — Other Ambulatory Visit: Payer: Self-pay

## 2020-08-20 DIAGNOSIS — R413 Other amnesia: Secondary | ICD-10-CM | POA: Insufficient documentation

## 2020-08-20 DIAGNOSIS — I6381 Other cerebral infarction due to occlusion or stenosis of small artery: Secondary | ICD-10-CM

## 2020-08-20 HISTORY — DX: Other cerebral infarction due to occlusion or stenosis of small artery: I63.81

## 2020-09-02 ENCOUNTER — Ambulatory Visit: Payer: Federal, State, Local not specified - PPO | Admitting: Skilled Nursing Facility1

## 2020-09-26 ENCOUNTER — Ambulatory Visit (INDEPENDENT_AMBULATORY_CARE_PROVIDER_SITE_OTHER): Payer: Federal, State, Local not specified - PPO | Admitting: Psychology

## 2020-09-26 ENCOUNTER — Encounter: Payer: Self-pay | Admitting: Psychology

## 2020-09-26 ENCOUNTER — Encounter: Payer: Self-pay | Admitting: Neurology

## 2020-09-26 ENCOUNTER — Other Ambulatory Visit: Payer: Self-pay

## 2020-09-26 ENCOUNTER — Ambulatory Visit: Payer: Federal, State, Local not specified - PPO | Admitting: Psychology

## 2020-09-26 DIAGNOSIS — R4189 Other symptoms and signs involving cognitive functions and awareness: Secondary | ICD-10-CM

## 2020-09-26 DIAGNOSIS — F0670 Mild neurocognitive disorder due to known physiological condition without behavioral disturbance: Secondary | ICD-10-CM

## 2020-09-26 DIAGNOSIS — I6381 Other cerebral infarction due to occlusion or stenosis of small artery: Secondary | ICD-10-CM

## 2020-09-26 DIAGNOSIS — F321 Major depressive disorder, single episode, moderate: Secondary | ICD-10-CM

## 2020-09-26 DIAGNOSIS — G309 Alzheimer's disease, unspecified: Secondary | ICD-10-CM | POA: Diagnosis not present

## 2020-09-26 DIAGNOSIS — F02A18 Dementia in other diseases classified elsewhere, mild, with other behavioral disturbance: Secondary | ICD-10-CM | POA: Insufficient documentation

## 2020-09-26 DIAGNOSIS — G3184 Mild cognitive impairment, so stated: Secondary | ICD-10-CM | POA: Diagnosis not present

## 2020-09-26 HISTORY — DX: Mild neurocognitive disorder due to known physiological condition without behavioral disturbance: F06.70

## 2020-09-26 HISTORY — DX: Alzheimer's disease, unspecified: G30.9

## 2020-09-26 NOTE — Addendum Note (Signed)
Addended by: Hazle Coca C on: 09/26/2020 01:44 PM   Modules accepted: Orders

## 2020-09-26 NOTE — Progress Notes (Addendum)
NEUROPSYCHOLOGICAL EVALUATION [place] Department [facility]rology  Date of Evaluation: September 26, 2020  Reason for Referral:   Domenica FailLawrence E [name] is a 87 y.o. right-handed African-American male referred by Seabron SpatesYvonne Lowne Chase, D.O., to characterize his current cognitive functioning and assist with diagnostic clarity and treatment planning in the context of subjective memory dysfunction.   Assessment and Plan:   Clinical Impression(s): The availability of tests was limited given [name] [name]'s significant ongoing visual impairment. Of tests that were completed, [name] [name]'s pattern of performance is suggestive of fairly diffuse cognitive impairment often in the well below average to exceptionally low normative ranges. Performance variability was noted across basic attention, while performance was appropriate across receptive language, phonemic fluency, and a task assessing safety and judgment. However, performance across all other cognitive domains was impaired relative to age-matched peers. [name] [name] largely denied difficulties completing instrumental activities of daily living (ADLs) independently. His wife who accompanied him did not strongly contradict this. As such, given evidence for cognitive dysfunction described above, he meets criteria for a Mild Neurocognitive Disorder (formerly "mild cognitive impairment"). However, he is likely at the severe end of this spectrum and at an increased risk for a conversion to a major neurocognitive disorder ("dementia") in the future.   The etiology of deficits is somewhat unclear largely due to the diffuse nature of cognitive impairment. Despite this, I do have concerns surrounding late-onset Alzheimer's disease. Based upon population base rates, this would represent the most likely cause of neurodegenerative illness and there is also a family history of this condition in his father. Specific to testing, [name] [name] was fully amnestic  across verbal memory measures and performed poorly across yes/no recognition trials. This suggests evidence for memory storage dysfunction which is the hallmark characteristic of this condition. Additional impairments in confrontation naming, semantic fluency, and executive functioning would further be consistent with this presentation. Behavioral characteristics are not consistent with Lewy body dementia (outside of fully-formed visual hallucinations being present) or frontotemporal dementia. Neuroimaging did suggest a prior lacunar infract and some small vessel ischemic changes. However, while there may be a co-occurring vascular etiology, Alzheimer's disease appears more likely. While [name] [name] did report acute symptoms of moderate depression across a mood-related questionnaire, current impairments are above and beyond what would be expected from mood-related concerns alone. Continued medical monitoring will be important moving forward.   Recommendations: A referral to a neurologist would likely be beneficial given ongoing cognitive concerns. I would also recommend that he discuss with his PCP and future neurologist starting an acetylcholinesterase inhibitor such as donepezil/Aricept. This form of medication has been shown to slow functional decline in some individuals. However, it is important to highlight that no current treatment can stop or reverse cognitive decline in the face of a degenerative condition.   Given ongoing depressive symptoms, I would encourage him to discuss medication options/adjustments to better manage this condition with his PCP.   I would also recommend a formal hearing evaluation and/or consideration of hearing aids given ongoing hearing difficulties. There have been a few recent studies which have linked uncorrected hearing loss to cognitive decline and the development of a dementia presentation.   A repeat neuropsychological evaluation in 12-18 months (or sooner if functional  decline is noted) could be considered to assess the trajectory of future cognitive decline should it occur. This would also aid in future efforts towards improved diagnostic clarity.  Should there be a progression of his current deficits over  time, [name] [name] is unlikely to regain any independent living skills lost. Therefore, it is recommended that he remain as involved as possible in all aspects of household chores, finances, and medication management, with supervision to ensure adequate performance. He will likely benefit from the establishment and maintenance of a routine in order to maximize his functional abilities over time.  It will be important for [name] [name] to have another person with him when in situations where he may need to process information, weigh the pros and cons of different options, and make decisions, in order to ensure that he fully understands and recalls all information to be considered.  If not already done, [name] [name] and his family may want to discuss his wishes regarding durable power of attorney and medical decision making, so that he can have input into these choices. Additionally, they may wish to discuss future plans for caretaking and seek out community options for in home/residential care should they become necessary.  Important information to remember should be placed in written format in all instances. This should be placed in a highly visible and commonly frequented location of his residence to help promote recall.  Review of Records:   [name] [name] was seen by his PCP Roma Schanz, D.O.) on 05/01/2019 for follow-up of blood pressure and cholesterol concerns. During this appointment, he and his wife reported memory loss which had been ongoing for the past year. No other details were provided at that time regarding cognitive dysfunction. An order for a cognitive evaluation was placed on 02/04/2020. I was unable to locate any additional records outlining more specific  cognitive concerns. Ultimately, [name] [name] was referred for a comprehensive neuropsychological evaluation to characterize his cognitive abilities and to assist with diagnostic clarity and treatment planning.   Brain MRI on 08/20/2020 revealed mild cerebral atrophy, minimal chronic microvascular ischemic changes, and a chronic left thalamic lacunar infarct.   Past Medical History:  Diagnosis Date  . Abdominal pain 05/19/2010  . Abnormal EKG 04/10/2009  . Acute cholecystitis 05/22/2010  . BPH without obstruction/lower urinary tract symptoms 12/08/2006  . Bradycardia 04/21/2012  . Coronary atherosclerosis of native coronary artery 04/07/2010  . Disorder resulting from impaired renal function 04/07/2007  . Elevated PSA   . Essential hypertension 12/08/2006  . Hearing loss 12/08/2006   no utilized hearing aids  . Hip pain, left 04/29/2008  . Hyperlipidemia 12/08/2006   LDL goal <100  . 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 Primary open-angle glaucoma, severe stage 12/29/2006  . Renal insufficiency   . Weight loss observed 06/19/2008    Past Surgical History:  Procedure Laterality Date  . BLADDER SURGERY    . CATARACT EXTRACTION    . CHOLECYSTECTOMY  03/2010   [place] surgery  . COLONOSCOPY    . removal of cyst     right arm  . SHOULDER SURGERY     cyst removed left shoulder  . TONSILECTOMY, ADENOIDECTOMY, BILATERAL MYRINGOTOMY AND TUBES    . TRANSURETHRAL RESECTION OF PROSTATE      Current Outpatient Medications:  .  atorvastatin (LIPITOR) 40 MG tablet, Take 1 tablet (40 mg total) by mouth daily., Disp: 30 tablet, Rfl: 2 .  cloNIDine (CATAPRES) 0.1 MG tablet, Take 1 tablet (0.1 mg total) by mouth 2 (two) times daily., Disp: 180 tablet, Rfl: 1 .  diltiazem (MATZIM LA) 420 MG 24 hr tablet, Take 1 tablet (420 mg total) by mouth daily., Disp: 90 tablet,  Rfl: 1 .  dorzolamide-timolol (COSOPT) 22.3-6.8 MG/ML ophthalmic solution, 1 drop 2 (two) times  daily.  , Disp: , Rfl:  .  metoprolol succinate (TOPROL-XL) 25 MG 24 hr tablet, 1/2 tab po qd, Disp: 90 tablet, Rfl: 1 .  mirtazapine (REMERON) 15 MG tablet, Take 1 tablet (15 mg total) by mouth at bedtime., Disp: 30 tablet, Rfl: 5  Clinical Interview:   The following information was obtained during a clinical interview with [name] [name] and his wife prior to cognitive testing.  Cognitive Symptoms: Decreased short-term memory: Endorsed. He acknowledged having generalized memory concerns. Upon direct questioning, he reported trouble recalling the details of previous conversations, as well as misplacing things around his residence. His wife was in agreement with this and emphasized the latter example. Difficulties were said to have been present for the past 10 years but have seemed to worsen more quickly during the past year.  Decreased long-term memory: Denied. Decreased attention/concentration: Endorsed. He reported some trouble with sustained attention. Difficulties with increased distractibility were denied.  Reduced processing speed: Endorsed. Difficulties with executive functions: Endorsed. He reported trouble with organization. He also noted trouble with indecision in that decision making is a much slower process than in the past. His wife commented that once his decision has been made, it is quite difficult to change. This was said to be longstanding in nature.  Difficulties with emotion regulation: Denied. Difficulties with receptive language: Denied. Difficulties with word finding: Denied. Decreased visuoperceptual ability: Denied.  Difficulties completing ADLs: Largely denied. He manages his medications and their personal finances. His wife writes the checks due to visual difficulties and will ask him to make sure that he has taken his medications throughout the day. His wife also noted that he seems to exhibit increased confusion while counting money; some of this is due to trouble  remembering where he has placed various amounts of money. He does not currently drive due to vision difficulties. He did allude to memory loss as a secondary reason for him abstaining from driving.   Additional Medical History: History of traumatic brain injury/concussion: Denied. History of stroke: A recent brain MRI suggested a chronic left thalamic lacunar infarct. This was likely asymptomatic.  History of seizure activity: Denied. History of known exposure to toxins: Denied. Symptoms of chronic pain: Denied. Experience of frequent headaches/migraines: Denied. Frequent instances of dizziness/vertigo: Denied.  Sensory changes: Records suggest that he has severe stage open-angle glaucoma. He wears glasses but continues to report significant visual acuity concerns. He is also hard of hearing and does not utilize hearing aids. Other sensory changes/difficulties (e.g., taste or smell) were denied.  Balance/coordination difficulties: Endorsed. He described his balance as "sometimes off." One side of the body does not appear to be worse than the other and the cause for him needing to stop and re-balance himself is unknown. He denied a history of recent falls. However, his wife did report that he had fallen in the bedroom about three weeks prior. He did not recall this event.  Other motor difficulties: Denied.  Sleep History: Estimated hours obtained each night: 7-8 hours. His wife reported that this number may in fact be much higher and that there are days where he spends most of the day in bed. [name] [name] stated that while this is true, he is not asleep all day but may be watching television or doing other things while in bed.  Difficulties falling asleep: Denied. Difficulties staying asleep: Denied. Feels rested and refreshed upon awakening: Endorsed.  However, his wife did report that he will often quickly fall asleep upon sitting down during the day.   History of snoring: Endorsed. Symptoms were  said to be mild and not occur all the time.  History of waking up gasping for air: Denied. Witnessed breath cessation while asleep: Denied.  History of vivid dreaming: Denied. Excessive movement while asleep: Denied. Instances of acting out his dreams: Denied.  Psychiatric/Behavioral Health History: Depression: He denied to his knowledge being previously formally diagnosed with a depressive disorder. He acknowledged brief periods of poor mood throughout his life, especially after time spent in the [employer] where he saw combat during the [place] War. He noted that he used to experience nightmares but that these symptoms gradually subsided and have not been present for quite a long time. He described his current mood as pretty good. However, he did acknowledge frustrations surrounding his inability to get around on his own or drive due to visual decline. Current or remote suicidal ideation, intent, or plan was denied.  Anxiety: Denied. Mania: Denied. Trauma History: Denied. Visual/auditory hallucinations: Endorsed. He acknowledged a history of seeing children or other family members in his immediate surroundings. These appear to be fully-formed and the individuals seen were confirmed by his wife to not be present at the time he sees them. He noted that he will speak to them and that they often smile back at him but do not respond. Then, if he stops looking at them, they are gone when he returns his gaze. These instances were said to happen often. In addition to this, he described an event where two females were present in his home for a majority of the day. When they disappeared, he and his wife saw that they were short $500-700. His wife appeared to believe that these women represented a visual hallucination and that this money was simply misplaced.  Delusional thoughts: Denied.  Tobacco: Denied. Alcohol: He denied current alcohol consumption as well as a history of problematic alcohol abuse or  dependence.  Recreational drugs: He acknowledged remote marijuana use but denied current use.  Caffeine: He reported consuming several Pepsi beverages throughout the day.   Family History: Problem Relation Age of Onset  . Coronary artery disease Mother   . Hypertension Mother   . Heart disease Mother   . Diabetes Sister   . Heart disease Sister 18       died during CABG  . Hypertension Daughter   . Hypertension Son   . Alzheimer's disease Father    This information was confirmed by [name] [name].  Academic/Vocational History: Highest level of educational attainment: 14 years. His level of educational attainment is somewhat difficult to discern. He reported completing the 10th grade but did not earn his high school diploma. Later, after time spent in the [employer], he reported that the government assisted him in getting into college despite not having his diploma. He reportedly spent approximately four years in college but did not report earning a Bachelor's degree. He described himself as an average, Geographical information systems officer. No areas of weakness were identified.  History of developmental delay: Denied. History of grade repetition: Denied. Enrollment in special education courses: Denied. History of LD/ADHD: Denied.  Employment: Retired. 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   Evaluation Results:  Behavioral Observations: [name] [name] was accompanied by his wife, arrived to his appointment on time, and was appropriately dressed and groomed. He appeared alert and oriented. He ambulated slowly and utilized his wife as a guide and for extra support. This was likely due to visual acuity concerns. Gross motor functioning appeared intact upon informal observation and no abnormal movements (e.g., tremors)  were noted. His affect was generally relaxed and positive, but did range appropriately given the subject being discussed during the clinical interview or the task at hand during testing procedures. Mild hearing loss was apparent during the clinical interview. Spontaneous speech was fluent and word finding difficulties were not observed during the clinical interview. Thought processes were coherent, organized, and normal in content. Insight into his cognitive difficulties appeared adequate; however, he may not appreciate the extent of reported dysfunction. During testing, several tasks were discontinued or not attempted due to significant visual acuity concerns. Mood-related questionnaires were also read aloud to him. Sustained attention was appropriate. Task engagement was adequate and he persisted when challenged. Overall, [name] [name] was cooperative with the clinical interview and subsequent testing procedures.   Adequacy of Effort: The validity of neuropsychological testing is limited by the extent to which the individual being tested may be assumed to have exerted adequate effort during testing. [name] [name] expressed his intention to perform to the best of his abilities and exhibited adequate task engagement and persistence. Scores across stand-alone and embedded performance validity measures were below expectation. However, this is likely due to true cognitive dysfunction rather than attempts to perform poorly or limited engagement during testing. As such, I believe that the results of the current evaluation are a largely valid representation of [name] [name] current cognitive functioning.  Test Results: [name] [name] was mildly disoriented at the time of the current evaluation. He was unable to state his phone number. He also incorrectly stated the current year ("2021") and was unable to state his current location.  Intellectual abilities based upon educational and vocational attainment were estimated to be in the  average range. Premorbid abilities were estimated to be within the exceptionally low to well below average range based upon a combination of subtests used to calculate a short-form estimation of intellectual functioning.    Processing speed was well below average across a single numerical sequencing task. Basic attention was well below average to average. More complex attention (e.g., working memory) was well below average to below average. Executive functioning was exceptionally low across tasks assessing verbal cognitive flexibility and abstract reasoning. He performed in the average range across a task assessing safety and judgment.  Assessed receptive language abilities were below average. During testing, Mr. Soergel did not exhibit significant difficulties comprehending task instructions and answered all questions asked of him appropriately. Assessed expressive language (e.g., verbal fluency and confrontation naming) was mildly variable. Phonemic fluency was below average, semantic fluency was well below average, and auditory confrontation naming was exceptionally low.     Assessed visuospatial/visuoconstructional abilities were unable to be assessed given the extent of visual impairment.    Learning (i.e., encoding) of novel verbal information was exceptionally low. Spontaneous delayed recall (i.e., retrieval) of previously learned information was also exceptionally low. Retention rates were 0% across a story learning task and 0% across a list learning task. Performance across recognition tasks was likewise exceptionally low, suggesting minimal evidence for information consolidation.   Results of emotional screening instruments suggested that recent symptoms of generalized anxiety were in the minimal range, while symptoms of  depression were within the moderate range. A screening instrument assessing recent sleep quality suggested the presence of minimal sleep dysfunction.  Tables of Scores:   Note:  This summary of test scores accompanies the interpretive report and should not be considered in isolation without reference to the appropriate sections in the text. Descriptors are based on appropriate normative data and may be adjusted based on clinical judgment. The terms "impaired" and "within normal limits (WNL)" are used when a more specific level of functioning cannot be determined.       Effort Testing:   DESCRIPTOR       RBANS Effort Index: --- --- Below Expectation  WAIS-IV Reliable Digit Span: --- --- Below Expectation       Orientation:      Raw Score Percentile   NAB Orientation, Form 1 24/29 --- ---       Cognitive Screening:           Raw Score Percentile   SLUMS: Discontinued (vision) --- ---       RBANS, Form A: Standard Score/ Scaled Score Percentile   Total Score --- --- ---  Immediate Memory 49 <1 Exceptionally Low    List Learning 1 <1 Exceptionally Low    Story Memory 3 1 Exceptionally Low  Visuospatial/Constructional --- --- ---    Figure Copy Not Attempted (vision) --- ---    Line Orientation Not Attempted (vision) --- ---  Language --- --- ---    Picture Naming Not Attempted (vision) --- ---    Semantic Fluency 2 <1 Exceptionally Low  Attention --- --- ---    Digit Span 9 37 Average    Coding Not Attempted (vision) --- ---  Delayed Memory --- --- ---    List Recall 0/10 <2 Exceptionally Low    List Recognition 11/20 <2 Exceptionally Low    Story Recall 1 <1 Exceptionally Low    Story Recognition 4/12 <1 Exceptionally Low    Figure Recall Not Attempted (vision) --- ---    Figure Recognition Not Attempted (vision) --- ---       Intellectual Functioning:           Standard Score Percentile   Test of Premorbid Functioning: Discontinued (vision) --- ---        Scaled Score Percentile   WAIS-IV Vocabulary: 5 5 Well Below Average       Wechsler Adult Intelligence Scale Short Form*: Standard Score/ Scaled Score Percentile   Full Scale IQ  67 1  Severely Impaired    Information  5 5 Well Below Average    Digit Span 4 2 Well Below Average  *From [employer] (2009)          Attention/Executive Function:          Oral Trail Making Test: Raw Score (Z-Score) Percentile     Part A 11 secs.,  0 errors (-1.66) 5 Well Below Average    Part B Discontinued,  5 errors --- Impaired        Scaled Score Percentile   WAIS-IV Digit Span: 4 2 Well Below Average    Forward 4 2 Well Below Average    Backward 7 16 Below Average    Sequencing 4 2 Well Below Average        Scaled Score Percentile   WAIS-IV Similarities: 2 <1 Exceptionally Low       D-KEFS Color-Word Interference Test: Raw Score (Scaled Score) Percentile     Color Naming Not Attempted (vision) --- ---  Word Reading Not Attempted (vision) --- ---    Inhibition Not Attempted (vision) --- ---    Inhibition/Switching Not Attempted (vision) --- ---       NAB Executive Functions Module, Form 1: T Score Percentile     Judgment 45 31 Average       Language:          Boston Diagnostic Aphasia Examination Raw Score Percentile     Complex Ideational Material: 10/12 --- Below Average       Verbal Fluency Test: Raw Score (Scaled Score) Percentile     Phonemic Fluency (CFL) 19 (7) 16 Below Average    Category Fluency 19 (4) 2 Well Below Average  *Based on [employer] Older Normative Studies (MOANS)          NAB Language Module, Form 1: T Score Percentile     Naming Discontinued (vision) <1 Exceptionally Low        Raw Score (Z-Score) Percentile   Auditory Naming Test: 34/50 (-8.2) <1 Exceptionally Low       Visuospatial/Visuoconstruction:      Raw Score Percentile   Clock Drawing: Discontinued (vision) --- ---       Mood and Personality:      Raw Score Percentile   PROMIS Depression Questionnaire: 32 --- Moderate  PROMIS Anxiety Questionnaire: 14 --- None to Slight       Additional Questionnaires:      Raw Score Percentile   PROMIS Sleep Disturbance Questionnaire: 10  --- None to Slight   Informed Consent and Coding/Compliance:   Mr. Mehra was provided with a verbal description of the nature and purpose of the present neuropsychological evaluation. Also reviewed were the foreseeable risks and/or discomforts and benefits of the procedure, limits of confidentiality, and mandatory reporting requirements of this provider. The patient was given the opportunity to ask questions and receive answers about the evaluation. Oral consent to participate was provided by the patient.   This evaluation was conducted by Christia Reading, Ph.D., licensed clinical neuropsychologist. Mr. Atteberry completed a comprehensive clinical interview with Dr. [name], billed as one unit [phone], and 105 minutes of cognitive testing and scoring, billed as one unit [phone] and three additional units [id number]. Psychometrist Milana Kidney, B.S., assisted Dr. [name] with test administration and scoring procedures. As a separate and discrete service, Dr. [name] spent a total of 130 minutes in interpretation and report writing billed as one unit [phone] and one unit [phone].

## 2020-09-26 NOTE — Progress Notes (Signed)
   Psychometrician Note   Cognitive testing was administered to [name] [name] by Milana Kidney, B.S. (psychometrist) under the supervision of Dr. Christia Reading, Ph.D., licensed psychologist on 09/26/20. [name] did not appear overtly distressed by the testing session per behavioral observation or responses across self-report questionnaires. Dr. Christia Reading, Ph.D. checked in with [name] as needed to manage any distress related to testing procedures (if applicable). Rest breaks were offered.    The battery of tests administered was selected by Dr. Christia Reading, Ph.D. with consideration to [name] current level of functioning, the nature of his symptoms, emotional and behavioral responses during interview, level of literacy, observed level of motivation/effort, and the nature of the referral question. This battery was communicated to the psychometrist. Communication between Dr. Christia Reading, Ph.D. and the psychometrist was ongoing throughout the evaluation and Dr. Christia Reading, Ph.D. was immediately accessible at all times. Dr. Christia Reading, Ph.D. provided supervision to the psychometrist on the date of this service to the extent necessary to assure the quality of all services provided.    [name] [name] will return within approximately 1-2 weeks for an interactive feedback session with Dr. [name] at which time his test performances, clinical impressions, and treatment recommendations will be reviewed in detail. [name] understands he can contact our office should he require our assistance before this time.  A total of 105 minutes of billable time were spent face-to-face with Mr. Heemstra by the psychometrist. This includes both test administration and scoring time. Billing for these services is reflected in the clinical report generated by Dr. Christia Reading, Ph.D..  This note reflects time spent with the psychometrician and does not include test scores or any clinical  interpretations made by Dr. [name]. The full report will follow in a separate note.

## 2020-10-03 ENCOUNTER — Ambulatory Visit (INDEPENDENT_AMBULATORY_CARE_PROVIDER_SITE_OTHER): Payer: Federal, State, Local not specified - PPO | Admitting: Psychology

## 2020-10-03 ENCOUNTER — Other Ambulatory Visit: Payer: Self-pay

## 2020-10-03 DIAGNOSIS — G3184 Mild cognitive impairment, so stated: Secondary | ICD-10-CM

## 2020-10-03 DIAGNOSIS — G309 Alzheimer's disease, unspecified: Secondary | ICD-10-CM

## 2020-10-03 DIAGNOSIS — I6381 Other cerebral infarction due to occlusion or stenosis of small artery: Secondary | ICD-10-CM

## 2020-10-03 NOTE — Progress Notes (Signed)
   Neuropsychology Feedback Session [name] [name]. [facility]  Reason for Referral:   [name] [name] a 87 y.o. right-handed African-American male referred by [name] [name], D.O.,to characterize hiscurrent cognitive functioning and assist with diagnostic clarity and treatment planning in the context of subjective memory dysfunction.   Feedback:   [name] completed a comprehensive neuropsychological evaluation on 09/26/2020. Please refer to that encounter for the full report and recommendations. The availability of tests was limited given [name] significant ongoing visual impairment. Of tests that were completed, [name] pattern of performance is suggestive of fairly diffuse cognitive impairment often in the well below average to exceptionally low normative ranges. Performance variability was noted across basic attention, while performance was appropriate across receptive language, phonemic fluency, and a task assessing safety and judgment. However, performance across all other cognitive domains was impaired relative to age-matched peers. The etiology of deficits is somewhat unclear largely due to the diffuse nature of cognitive impairment. Despite this, I do have concerns surrounding late-onset Alzheimer's disease. Based upon population base rates, this would represent the most likely cause of neurodegenerative illness and there is also a family history of this condition in his father. Specific to testing, [name] was fully amnestic across verbal memory measures and performed poorly across yes/no recognition trials. This suggests evidence for memory storage dysfunction which is the hallmark characteristic of this condition.  Feedback was changed to a telephone call due to concerns surrounding inclement winter weather. [name] was unaccompanied during the current telephone call. He was within his residence while I was within my home office. I  discussed the limitations of evaluation and management by telemedicine and the availability of in person appointments. [name] expressed his understanding and agreed to proceed. Content of the current session focused on the results of his neuropsychological evaluation. [name] was given the opportunity to ask questions and his questions were answered. I did have concerns that he was understanding the results and their implications during our telephone call. He was encouraged several times to reach out should additional questions arise. A copy of his report was mailed at the conclusion of the visit.      Less than 16 minutes were spent conducting the current feedback session with [name].

## 2020-10-03 NOTE — Patient Instructions (Signed)
A referral to a neurologist would likely be beneficial given ongoing cognitive concerns. I would also recommend that he discuss with his PCP and future neurologist starting an acetylcholinesterase inhibitor such as donepezil/Aricept. This form of medication has been shown to slow functional decline in some individuals. However, it is important to highlight that no current treatment can stop or reverse cognitive decline in the face of a degenerative condition.   Given ongoing depressive symptoms, I would encourage him to discuss medication options/adjustments to better manage this condition with his PCP.   I would also recommend a formal hearing evaluation and/or consideration of hearing aids given ongoing hearing difficulties. There have been a few recent studies which have linked uncorrected hearing loss to cognitive decline and the development of a dementia presentation.   A repeat neuropsychological evaluation in 12-18 months (or sooner if functional decline is noted) could be considered to assess the trajectory of future cognitive decline should it occur. This would also aid in future efforts towards improved diagnostic clarity.  Should there be a progression of his current deficits over time, [name] [name] is unlikely to regain any independent living skills lost. Therefore, it is recommended that he remain as involved as possible in all aspects of household chores, finances, and medication management, with supervision to ensure adequate performance. He will likely benefit from the establishment and maintenance of a routine in order to maximize his functional abilities over time.  It will be important for [name] [name] to have another person with him when in situations where he may need to process information, weigh the pros and cons of different options, and make decisions, in order to ensure that he fully understands and recalls all information to be considered.  If not already done, [name] [name] and his  family may want to discuss his wishes regarding durable power of attorney and medical decision making, so that he can have input into these choices. Additionally, they may wish to discuss future plans for caretaking and seek out community options for in home/residential care should they become necessary.  Important information to remember should be placed in written format in all instances. This should be placed in a highly visible and commonly frequented location of his residence to help promote recall.

## 2020-10-04 ENCOUNTER — Other Ambulatory Visit: Payer: Self-pay | Admitting: Family Medicine

## 2020-10-04 DIAGNOSIS — H9193 Unspecified hearing loss, bilateral: Secondary | ICD-10-CM

## 2020-10-04 DIAGNOSIS — F0390 Unspecified dementia without behavioral disturbance: Secondary | ICD-10-CM

## 2020-10-04 NOTE — Progress Notes (Signed)
I will refer to neuro per Dr [name] recommendation and to audiology for hearing eval ----- He d/w pt and wife but I just want to make sure they are ok with it  Are they interested in try the medication aricept as Dr [name] recommended?  If yes--- start aricept 5 mg 1 po qd #30  2 refills

## 2020-11-04 ENCOUNTER — Other Ambulatory Visit: Payer: Self-pay

## 2020-11-04 ENCOUNTER — Encounter: Payer: Self-pay | Admitting: Family Medicine

## 2020-11-04 ENCOUNTER — Ambulatory Visit: Payer: Federal, State, Local not specified - PPO | Admitting: Family Medicine

## 2020-11-04 DIAGNOSIS — F321 Major depressive disorder, single episode, moderate: Secondary | ICD-10-CM

## 2020-11-04 DIAGNOSIS — H9190 Unspecified hearing loss, unspecified ear: Secondary | ICD-10-CM

## 2020-11-04 DIAGNOSIS — I10 Essential (primary) hypertension: Secondary | ICD-10-CM | POA: Diagnosis not present

## 2020-11-04 DIAGNOSIS — R634 Abnormal weight loss: Secondary | ICD-10-CM

## 2020-11-04 LAB — COMPREHENSIVE METABOLIC PANEL
ALT: 7 U/L (ref 0–53)
AST: 16 U/L (ref 0–37)
Albumin: 3.6 g/dL (ref 3.5–5.2)
Alkaline Phosphatase: 50 U/L (ref 39–117)
BUN: 16 mg/dL (ref 6–23)
CO2: 28 mEq/L (ref 19–32)
Calcium: 8.9 mg/dL (ref 8.4–10.5)
Chloride: 105 mEq/L (ref 96–112)
Creatinine, Ser: 1.44 mg/dL (ref 0.40–1.50)
GFR: 43.9 mL/min — ABNORMAL LOW (ref >60.00)
Glucose, Bld: 84 mg/dL (ref 70–99)
Potassium: 3.8 mEq/L (ref 3.5–5.1)
Sodium: 141 mEq/L (ref 135–145)
Total Bilirubin: 1.3 mg/dL — ABNORMAL HIGH (ref 0.2–1.2)
Total Protein: 6.8 g/dL (ref 6.0–8.3)

## 2020-11-04 LAB — LIPID PANEL
Cholesterol: 163 mg/dL (ref 0–200)
HDL: 53.3 mg/dL (ref >39.00)
LDL Cholesterol: 94 mg/dL (ref 0–99)
NonHDL: 109.92
Total CHOL/HDL Ratio: 3
Triglycerides: 79 mg/dL (ref 0.0–149.0)
VLDL: 15.8 mg/dL (ref 0.0–40.0)

## 2020-11-04 MED ORDER — METOPROLOL SUCCINATE ER 50 MG PO TB24: 50.0000 mg | ORAL_TABLET | Freq: Every day | ORAL | 1 refills | Status: DC

## 2020-11-04 MED ORDER — MIRTAZAPINE 15 MG PO TABS: 15.0000 mg | ORAL_TABLET | Freq: Every day | ORAL | 3 refills | Status: DC

## 2020-11-04 MED ORDER — DILTIAZEM HCL ER COATED BEADS 420 MG PO TB24: 420.0000 mg | ORAL_TABLET | Freq: Every day | ORAL | 1 refills | Status: DC

## 2020-11-04 NOTE — Assessment & Plan Note (Signed)
Doing well with diet  remeron helping

## 2020-11-04 NOTE — Assessment & Plan Note (Signed)
Pt does not want an eval at this time

## 2020-11-04 NOTE — Progress Notes (Signed)
Patient ID: [name] [name], male    DOB: 05/28/1932  Age: 87 y.o. MRN: [id number]    Subjective:  Subjective  HPI [name] [name] presents for weight loss , memory loss ----- he has gained 4 lbs since his last visit The memory has remained the same.  He has seen neuropsych and a neuro app with made.  They did not want to start meds until they saw neuro They also do not want to do a hearing evaluation --- he has hearing loss from the guns in the [employer].    Review of Systems  Constitutional: Negative for appetite change, diaphoresis, fatigue and unexpected weight change.  Eyes: Negative for pain, redness and visual disturbance.  Respiratory: Negative for cough, chest tightness, shortness of breath and wheezing.   Cardiovascular: Negative for chest pain, palpitations and leg swelling.  Endocrine: Negative for cold intolerance, heat intolerance, polydipsia, polyphagia and polyuria.  Genitourinary: Negative for difficulty urinating, dysuria and frequency.  Neurological: Negative for dizziness, light-headedness, numbness and headaches.    History Past Medical History:  Diagnosis Date  . Abdominal pain 05/19/2010  . Abnormal EKG 04/10/2009  . Acute cholecystitis 05/22/2010  . BPH without obstruction/lower urinary tract symptoms 12/08/2006  . Bradycardia 04/21/2012  . Coronary atherosclerosis of native coronary artery 04/07/2010  . Disorder resulting from impaired renal function 04/07/2007  . Elevated PSA   . Essential hypertension 12/08/2006  . Hearing loss 12/08/2006   no utilized hearing aids  . Hip pain, left 04/29/2008  . Hyperlipidemia 12/08/2006   LDL goal <100  . 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 Mild neurocognitive disorder due to Alzheimer's disease, possible 09/26/2020  . Primary open-angle glaucoma, severe stage 12/29/2006  . Renal insufficiency   . Weight loss observed 06/19/2008    He has a past surgical history that includes  Transurethral resection of prostate; Cholecystectomy (03/2010); Colonoscopy; Tonsilectomy, adenoidectomy, bilateral myringotomy and tubes; removal of cyst; Shoulder surgery; Bladder surgery; and Cataract extraction.   His family history includes Alzheimer's disease in his father; Coronary artery disease in his mother; Diabetes in his sister; Heart disease in his mother; Heart disease (age of onset: 35) in his sister; Hypertension in his daughter, mother, and son.He reports that he quit smoking about 27 years ago. His smoking use included cigarettes. He has a 6.00 pack-year smoking history. He has never used smokeless tobacco. He reports previous drug use. Drug: Marijuana. He reports that he does not drink alcohol.  Current Outpatient Medications on File Prior to Visit  Medication Sig Dispense Refill  . atorvastatin (LIPITOR) 40 MG tablet Take 1 tablet (40 mg total) by mouth daily. 30 tablet 2  . cloNIDine (CATAPRES) 0.1 MG tablet Take 1 tablet (0.1 mg total) by mouth 2 (two) times daily. 180 tablet 1  . dorzolamide-timolol (COSOPT) 22.3-6.8 MG/ML ophthalmic solution 1 drop 2 (two) times daily.     No current facility-administered medications on file prior to visit.     Objective:  Objective  Physical Exam Vitals and nursing note reviewed.  Constitutional:      General: He is sleeping. Vital signs are normal.     Appearance: He is well-developed and well-nourished.  HENT:     Head: Normocephalic and atraumatic.     Mouth/Throat:     Mouth: Oropharynx is clear and moist.  Eyes:     Extraocular Movements: EOM normal.     Pupils: Pupils are equal, round, and reactive to light.  Neck:     Thyroid: No thyromegaly.  Cardiovascular:     Rate and Rhythm: Normal rate and regular rhythm.     Heart sounds: No murmur heard.   Pulmonary:     Effort: Pulmonary effort is normal. No respiratory distress.     Breath sounds: Normal breath sounds. No wheezing or rales.  Chest:     Chest wall: No  tenderness.  Musculoskeletal:        General: No tenderness or edema.     Cervical back: Normal range of motion and neck supple.  Skin:    General: Skin is warm and dry.  Neurological:     Mental Status: He is oriented to person, place, and time.  Psychiatric:        Mood and Affect: Mood and affect normal.        Behavior: Behavior normal.        Thought Content: Thought content normal.        Judgment: Judgment normal.    BP (!) 170/100 (BP Location: Right Arm, Patient Position: Sitting, Cuff Size: Normal)   Pulse 74   Temp 98.6 F (37 C) (Oral)   Resp 18   Ht 5\' 9"  (1.753 m)   Wt 134 lb 6.4 oz (61 kg)   SpO2 97%   BMI 19.85 kg/m  Wt Readings from Last 3 Encounters:  11/04/20 134 lb 6.4 oz (61 kg)  08/04/20 130 lb 3.2 oz (59.1 kg)  05/05/20 136 lb (61.7 kg)     Lab Results  Component Value Date   WBC 6.9 05/01/2019   HGB 12.2 (L) 05/01/2019   HCT 37.0 (L) 05/01/2019   PLT 190.0 05/01/2019   GLUCOSE 84 11/04/2020   CHOL 163 11/04/2020   TRIG 79.0 11/04/2020   HDL 53.30 11/04/2020   LDLDIRECT 170.2 10/09/2007   LDLCALC 94 11/04/2020   ALT 7 11/04/2020   AST 16 11/04/2020   NA 141 11/04/2020   K 3.8 11/04/2020   CL 105 11/04/2020   CREATININE 1.44 11/04/2020   BUN 16 11/04/2020   CO2 28 11/04/2020   TSH 1.71 05/01/2019   PSA 31.37 (H) 04/06/2016   HGBA1C 5.1 04/02/2013   MICROALBUR 9.4 (H) 10/06/2015    MR Brain Wo Contrast  Result Date: 08/20/2020 CLINICAL DATA:  Memory Loss, dementia EXAM: MRI HEAD WITHOUT CONTRAST TECHNIQUE: Multiplanar, multiecho pulse sequences of the brain and surrounding structures were obtained without intravenous contrast. COMPARISON:  01/18/2006 and prior. FINDINGS: Brain: No diffusion-weighted signal abnormality. No intracranial hemorrhage. No midline shift, ventriculomegaly or extra-axial fluid collection. No mass lesion. Mild cerebral atrophy with ex vacuo dilatation. Minimal chronic microvascular ischemic changes. Remote left  thalamic lacunar insult. Vascular: Proximally preserved major intracranial flow voids. Skull and upper cervical spine: Normal marrow signal. Sinuses/Orbits: Sequela of bilateral lens replacement and glaucoma shunts. Mild left maxillary sinus mucosal thickening. Clear mastoid air cells. Other: None. IMPRESSION: 1. No acute intracranial abnormality. Chronic left thalamic lacunar insult. 2. Mild cerebral atrophy and minimal chronic microvascular ischemic changes. Electronically Signed   By: Primitivo Gauze M.D.   On: 08/20/2020 14:24     Assessment & Plan:  Plan  I have discontinued Cyrus E. Strout's metoprolol succinate. I am also having him start on metoprolol succinate. Additionally, I am having him maintain his dorzolamide-timolol, atorvastatin, cloNIDine, diltiazem, and mirtazapine.  Meds ordered this encounter  Medications  . diltiazem (MATZIM LA) 420 MG 24 hr tablet    Sig: Take 1 tablet (420 mg  total) by mouth daily.    Dispense:  90 tablet    Refill:  1  . metoprolol succinate (TOPROL-XL) 50 MG 24 hr tablet    Sig: Take 1 tablet (50 mg total) by mouth daily. Take with or immediately following a meal.    Dispense:  90 tablet    Refill:  1  . mirtazapine (REMERON) 15 MG tablet    Sig: Take 1 tablet (15 mg total) by mouth at bedtime.    Dispense:  90 tablet    Refill:  3    Problem List Items Addressed This Visit      Unprioritized   Essential hypertension    Poorly controlled will alter medications, encouraged DASH diet, minimize caffeine and obtain adequate sleep. Report concerning symptoms and follow up as directed and as needed Metoprolol adjusted        Relevant Medications   diltiazem (MATZIM LA) 420 MG 24 hr tablet   metoprolol succinate (TOPROL-XL) 50 MG 24 hr tablet   Other Relevant Orders   Lipid panel (Completed)   Comprehensive metabolic panel (Completed)   Hearing loss    Pt does not want an eval at this time      Weight loss observed    Doing well with  diet  remeron helping        Other Visit Diagnoses    Depression, major, single episode, moderate (HCC)       Relevant Medications   mirtazapine (REMERON) 15 MG tablet   Other Relevant Orders   Lipid panel (Completed)   Comprehensive metabolic panel (Completed)      Follow-up: Return in about 3 weeks (around 11/25/2020), or if symptoms worsen or fail to improve, for hypertension----cpe in 6 months .  [name], DO

## 2020-11-04 NOTE — Assessment & Plan Note (Signed)
Poorly controlled will alter medications, encouraged DASH diet, minimize caffeine and obtain adequate sleep. Report concerning symptoms and follow up as directed and as needed Metoprolol adjusted

## 2020-11-04 NOTE — Patient Instructions (Signed)

## 2020-11-25 ENCOUNTER — Emergency Department (HOSPITAL_BASED_OUTPATIENT_CLINIC_OR_DEPARTMENT_OTHER): Payer: Medicare Other

## 2020-11-25 ENCOUNTER — Encounter (HOSPITAL_BASED_OUTPATIENT_CLINIC_OR_DEPARTMENT_OTHER): Payer: Self-pay | Admitting: Emergency Medicine

## 2020-11-25 ENCOUNTER — Ambulatory Visit: Payer: Federal, State, Local not specified - PPO | Admitting: Family Medicine

## 2020-11-25 ENCOUNTER — Encounter: Payer: Self-pay | Admitting: Family Medicine

## 2020-11-25 ENCOUNTER — Observation Stay (HOSPITAL_COMMUNITY): Payer: Medicare Other

## 2020-11-25 ENCOUNTER — Other Ambulatory Visit: Payer: Self-pay

## 2020-11-25 ENCOUNTER — Inpatient Hospital Stay (HOSPITAL_BASED_OUTPATIENT_CLINIC_OR_DEPARTMENT_OTHER)
Admission: EM | Admit: 2020-11-25 | Discharge: 2020-11-27 | DRG: 092 | Disposition: A | Discharge status: Home Health | Payer: Medicare Other | Attending: Internal Medicine | Admitting: Internal Medicine

## 2020-11-25 VITALS — BP 140/100 | HR 48 | Temp 98.8°F | Resp 18 | Ht 69.0 in

## 2020-11-25 DIAGNOSIS — I130 Hypertensive heart and chronic kidney disease with heart failure and stage 1 through stage 4 chronic kidney disease, or unspecified chronic kidney disease: Secondary | ICD-10-CM | POA: Diagnosis not present

## 2020-11-25 DIAGNOSIS — N1831 Chronic kidney disease, stage 3a: Secondary | ICD-10-CM | POA: Diagnosis not present

## 2020-11-25 DIAGNOSIS — N400 Enlarged prostate without lower urinary tract symptoms: Secondary | ICD-10-CM | POA: Diagnosis not present

## 2020-11-25 DIAGNOSIS — R531 Weakness: Secondary | ICD-10-CM | POA: Diagnosis not present

## 2020-11-25 DIAGNOSIS — I5020 Unspecified systolic (congestive) heart failure: Secondary | ICD-10-CM | POA: Diagnosis not present

## 2020-11-25 DIAGNOSIS — I2510 Atherosclerotic heart disease of native coronary artery without angina pectoris: Secondary | ICD-10-CM | POA: Diagnosis not present

## 2020-11-25 DIAGNOSIS — E785 Hyperlipidemia, unspecified: Secondary | ICD-10-CM | POA: Diagnosis present

## 2020-11-25 DIAGNOSIS — F02A18 Dementia in other diseases classified elsewhere, mild, with other behavioral disturbance: Secondary | ICD-10-CM | POA: Diagnosis present

## 2020-11-25 DIAGNOSIS — I422 Other hypertrophic cardiomyopathy: Secondary | ICD-10-CM | POA: Diagnosis present

## 2020-11-25 DIAGNOSIS — E162 Hypoglycemia, unspecified: Secondary | ICD-10-CM | POA: Diagnosis present

## 2020-11-25 DIAGNOSIS — Z79899 Other long term (current) drug therapy: Secondary | ICD-10-CM

## 2020-11-25 DIAGNOSIS — I10 Essential (primary) hypertension: Secondary | ICD-10-CM | POA: Diagnosis present

## 2020-11-25 DIAGNOSIS — G928 Other toxic encephalopathy: Principal | ICD-10-CM | POA: Diagnosis present

## 2020-11-25 DIAGNOSIS — G9340 Encephalopathy, unspecified: Secondary | ICD-10-CM | POA: Diagnosis present

## 2020-11-25 DIAGNOSIS — I161 Hypertensive emergency: Secondary | ICD-10-CM | POA: Diagnosis not present

## 2020-11-25 DIAGNOSIS — Z888 Allergy status to other drugs, medicaments and biological substances status: Secondary | ICD-10-CM | POA: Diagnosis not present

## 2020-11-25 DIAGNOSIS — F05 Delirium due to known physiological condition: Secondary | ICD-10-CM | POA: Diagnosis not present

## 2020-11-25 DIAGNOSIS — Z20822 Contact with and (suspected) exposure to covid-19: Secondary | ICD-10-CM | POA: Diagnosis not present

## 2020-11-25 DIAGNOSIS — Z820 Family history of epilepsy and other diseases of the nervous system: Secondary | ICD-10-CM | POA: Diagnosis not present

## 2020-11-25 DIAGNOSIS — G459 Transient cerebral ischemic attack, unspecified: Secondary | ICD-10-CM

## 2020-11-25 DIAGNOSIS — Z8249 Family history of ischemic heart disease and other diseases of the circulatory system: Secondary | ICD-10-CM

## 2020-11-25 DIAGNOSIS — Z87891 Personal history of nicotine dependence: Secondary | ICD-10-CM

## 2020-11-25 DIAGNOSIS — R001 Bradycardia, unspecified: Secondary | ICD-10-CM | POA: Diagnosis not present

## 2020-11-25 DIAGNOSIS — H401193 Primary open-angle glaucoma, unspecified eye, severe stage: Secondary | ICD-10-CM | POA: Diagnosis not present

## 2020-11-25 DIAGNOSIS — F0280 Dementia in other diseases classified elsewhere without behavioral disturbance: Secondary | ICD-10-CM | POA: Diagnosis present

## 2020-11-25 DIAGNOSIS — G309 Alzheimer's disease, unspecified: Secondary | ICD-10-CM | POA: Diagnosis present

## 2020-11-25 DIAGNOSIS — Z8673 Personal history of transient ischemic attack (TIA), and cerebral infarction without residual deficits: Secondary | ICD-10-CM | POA: Diagnosis not present

## 2020-11-25 DIAGNOSIS — R4182 Altered mental status, unspecified: Secondary | ICD-10-CM | POA: Diagnosis not present

## 2020-11-25 DIAGNOSIS — H9190 Unspecified hearing loss, unspecified ear: Secondary | ICD-10-CM | POA: Diagnosis present

## 2020-11-25 LAB — URINALYSIS, ROUTINE W REFLEX MICROSCOPIC
Bilirubin Urine: NEGATIVE
Glucose, UA: >=500 mg/dL — AB
Hgb urine dipstick: NEGATIVE
Ketones, ur: NEGATIVE mg/dL
Leukocytes,Ua: NEGATIVE
Nitrite: NEGATIVE
Protein, ur: NEGATIVE mg/dL
Specific Gravity, Urine: 1.01 (ref 1.005–1.030)
pH: 7 (ref 5.0–8.0)

## 2020-11-25 LAB — GLUCOSE, CAPILLARY: Glucose-Capillary: 79 mg/dL (ref 70–99)

## 2020-11-25 LAB — RAPID URINE DRUG SCREEN, HOSP PERFORMED
Amphetamines: NOT DETECTED
Barbiturates: NOT DETECTED
Benzodiazepines: NOT DETECTED
Cocaine: NOT DETECTED
Opiates: NOT DETECTED
Tetrahydrocannabinol: NOT DETECTED

## 2020-11-25 LAB — DIFFERENTIAL
Abs Immature Granulocytes: 0.01 10*3/uL (ref 0.00–0.07)
Basophils Absolute: 0.1 10*3/uL (ref 0.0–0.1)
Basophils Relative: 1 %
Eosinophils Absolute: 0.1 10*3/uL (ref 0.0–0.5)
Eosinophils Relative: 1 %
Immature Granulocytes: 0 %
Lymphocytes Relative: 18 %
Lymphs Abs: 1.2 10*3/uL (ref 0.7–4.0)
Monocytes Absolute: 0.5 10*3/uL (ref 0.1–1.0)
Monocytes Relative: 7 %
Neutro Abs: 5 10*3/uL (ref 1.7–7.7)
Neutrophils Relative %: 73 %

## 2020-11-25 LAB — CBC
HCT: 38.7 % — ABNORMAL LOW (ref 39.0–52.0)
HCT: 38.1 % — ABNORMAL LOW (ref 39.0–52.0)
Hemoglobin: 12.8 g/dL — ABNORMAL LOW (ref 13.0–17.0)
Hemoglobin: 12.7 g/dL — ABNORMAL LOW (ref 13.0–17.0)
MCH: 30.6 pg (ref 26.0–34.0)
MCH: 30.4 pg (ref 26.0–34.0)
MCHC: 33.1 g/dL (ref 30.0–36.0)
MCHC: 33.3 g/dL (ref 30.0–36.0)
MCV: 92.6 fL (ref 80.0–100.0)
MCV: 91.1 fL (ref 80.0–100.0)
Platelets: 216 10*3/uL (ref 150–400)
Platelets: 196 10*3/uL (ref 150–400)
RBC: 4.18 MIL/uL — ABNORMAL LOW (ref 4.22–5.81)
RBC: 4.18 MIL/uL — ABNORMAL LOW (ref 4.22–5.81)
RDW: 12.2 % (ref 11.5–15.5)
RDW: 12.2 % (ref 11.5–15.5)
WBC: 6.9 10*3/uL (ref 4.0–10.5)
WBC: 8.2 10*3/uL (ref 4.0–10.5)
nRBC: 0 % (ref 0.0–0.2)
nRBC: 0 % (ref 0.0–0.2)

## 2020-11-25 LAB — CBG MONITORING, ED
Glucose-Capillary: 154 mg/dL — ABNORMAL HIGH (ref 70–99)
Glucose-Capillary: 65 mg/dL — ABNORMAL LOW (ref 70–99)

## 2020-11-25 LAB — CREATININE, SERUM
Creatinine, Ser: 1.27 mg/dL — ABNORMAL HIGH (ref 0.61–1.24)
GFR, Estimated: 55 mL/min — ABNORMAL LOW (ref >60)

## 2020-11-25 LAB — COMPREHENSIVE METABOLIC PANEL
ALT: 9 U/L (ref 0–44)
AST: 16 U/L (ref 15–41)
Albumin: 3.5 g/dL (ref 3.5–5.0)
Alkaline Phosphatase: 49 U/L (ref 38–126)
Anion gap: 7 (ref 5–15)
BUN: 18 mg/dL (ref 8–23)
CO2: 26 mmol/L (ref 22–32)
Calcium: 8.8 mg/dL — ABNORMAL LOW (ref 8.9–10.3)
Chloride: 106 mmol/L (ref 98–111)
Creatinine, Ser: 1.51 mg/dL — ABNORMAL HIGH (ref 0.61–1.24)
GFR, Estimated: 45 mL/min — ABNORMAL LOW (ref >60)
Glucose, Bld: 84 mg/dL (ref 70–99)
Potassium: 3.6 mmol/L (ref 3.5–5.1)
Sodium: 139 mmol/L (ref 135–145)
Total Bilirubin: 1.1 mg/dL (ref 0.3–1.2)
Total Protein: 7.2 g/dL (ref 6.5–8.1)

## 2020-11-25 LAB — PROTIME-INR
INR: 1 (ref 0.8–1.2)
Prothrombin Time: 12.9 seconds (ref 11.4–15.2)

## 2020-11-25 LAB — URINALYSIS, MICROSCOPIC (REFLEX)

## 2020-11-25 LAB — RESP PANEL BY RT-PCR (FLU A&B, COVID) ARPGX2
Influenza A by PCR: NEGATIVE
Influenza B by PCR: NEGATIVE
SARS Coronavirus 2 by RT PCR: NEGATIVE

## 2020-11-25 LAB — ETHANOL: Alcohol, Ethyl (B): <10 mg/dL (ref <10)

## 2020-11-25 LAB — APTT: aPTT: 29 seconds (ref 24–36)

## 2020-11-25 MED ORDER — ACETAMINOPHEN 325 MG PO TABS
650.0000 mg | ORAL_TABLET | Freq: Four times a day (QID) | ORAL | Status: DC | PRN
  Administered 2020-11-27: 650 mg via ORAL
  Filled 2020-11-25: qty 2

## 2020-11-25 MED ORDER — BRIMONIDINE TARTRATE 0.2 % OP SOLN
1.0000 [drp] | Freq: Three times a day (TID) | OPHTHALMIC | Status: DC
  Administered 2020-11-25 – 2020-11-27 (×6): 1 [drp] via OPHTHALMIC
  Filled 2020-11-25: qty 5

## 2020-11-25 MED ORDER — CLONIDINE HCL 0.1 MG PO TABS: 0.1000 mg | ORAL_TABLET | Freq: Every day | ORAL | 1 refills | Status: DC

## 2020-11-25 MED ORDER — HEPARIN SODIUM (PORCINE) 5000 UNIT/ML IJ SOLN: 5000.0000 [IU] | Freq: Three times a day (TID) | INTRAMUSCULAR | Status: DC

## 2020-11-25 MED ORDER — ACETAMINOPHEN 650 MG RE SUPP: 650.0000 mg | Freq: Four times a day (QID) | RECTAL | Status: DC | PRN

## 2020-11-25 MED ORDER — DORZOLAMIDE HCL-TIMOLOL MAL 2-0.5 % OP SOLN
1.0000 [drp] | Freq: Two times a day (BID) | OPHTHALMIC | Status: DC
  Administered 2020-11-25 – 2020-11-27 (×4): 1 [drp] via OPHTHALMIC
  Filled 2020-11-25: qty 10

## 2020-11-25 MED ORDER — DEXTROSE 50 % IV SOLN
INTRAVENOUS | Status: AC
  Filled 2020-11-25: qty 50

## 2020-11-25 MED ORDER — ASPIRIN 81 MG PO CHEW
324.0000 mg | CHEWABLE_TABLET | Freq: Once | ORAL | Status: AC
  Administered 2020-11-25: 324 mg via ORAL
  Filled 2020-11-25: qty 4

## 2020-11-25 MED ORDER — IOHEXOL 350 MG/ML SOLN
100.0000 mL | Freq: Once | INTRAVENOUS | Status: AC | PRN
  Administered 2020-11-25: 80 mL via INTRAVENOUS

## 2020-11-25 MED ORDER — METOPROLOL SUCCINATE ER 25 MG PO TB24: 25.0000 mg | ORAL_TABLET | Freq: Every day | ORAL | 3 refills | Status: DC

## 2020-11-25 MED ORDER — ENOXAPARIN SODIUM 40 MG/0.4ML ~~LOC~~ SOLN
40.0000 mg | SUBCUTANEOUS | Status: DC
  Administered 2020-11-25 – 2020-11-26 (×2): 40 mg via SUBCUTANEOUS
  Filled 2020-11-25 (×2): qty 0.4

## 2020-11-25 MED ORDER — LOSARTAN POTASSIUM 50 MG PO TABS: 50.0000 mg | ORAL_TABLET | Freq: Every day | ORAL | 3 refills | Status: DC

## 2020-11-25 MED ORDER — DEXTROSE 50 % IV SOLN
50.0000 mL | Freq: Once | INTRAVENOUS | Status: AC
  Administered 2020-11-25: 50 mL via INTRAVENOUS

## 2020-11-25 MED ORDER — HYDRALAZINE HCL 20 MG/ML IJ SOLN
10.0000 mg | INTRAMUSCULAR | Status: DC | PRN
  Administered 2020-11-27: 10 mg via INTRAVENOUS
  Filled 2020-11-25 (×2): qty 1

## 2020-11-25 NOTE — Progress Notes (Signed)
EEG complete - results pending 

## 2020-11-25 NOTE — Progress Notes (Addendum)
Patient ID: MARL SEAGO, male    DOB: 15-Jun-1932  Age: 87 y.o. MRN: [id number]    Subjective:  Subjective  HPI [name] [name] presents for bp f/u ----- his wife is with him.  He was in his normal state this am when they left to come here but he "fell asleep" per his wife and when they got here he was too weak to get out and walk on his own   She had to put him in a wheelchair to come up her.    Review of Systems  Constitutional: Positive for appetite change and fatigue. Negative for diaphoresis and unexpected weight change.  Eyes: Negative for pain, redness and visual disturbance.  Respiratory: Negative for cough, chest tightness, shortness of breath and wheezing.   Cardiovascular: Negative for chest pain, palpitations and leg swelling.  Endocrine: Negative for cold intolerance, heat intolerance, polydipsia, polyphagia and polyuria.  Genitourinary: Negative for difficulty urinating, dysuria and frequency.  Neurological: Positive for speech difficulty and weakness. Negative for dizziness, light-headedness, numbness and headaches.    History Past Medical History:  Diagnosis Date  . Abdominal pain 05/19/2010  . Abnormal EKG 04/10/2009  . Acute cholecystitis 05/22/2010  . BPH without obstruction/lower urinary tract symptoms 12/08/2006  . Bradycardia 04/21/2012  . Coronary atherosclerosis of native coronary artery 04/07/2010  . Disorder resulting from impaired renal function 04/07/2007  . Elevated PSA   . Essential hypertension 12/08/2006  . Hearing loss 12/08/2006   no utilized hearing aids  . Hip pain, left 04/29/2008  . Hyperlipidemia 12/08/2006   LDL goal <100  . 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 Mild neurocognitive disorder due to Alzheimer's disease, possible 09/26/2020  . Primary open-angle glaucoma, severe stage 12/29/2006  . Renal insufficiency   . Weight loss observed 06/19/2008    He has a past surgical history that includes  Transurethral resection of prostate; Cholecystectomy (03/2010); Colonoscopy; Tonsilectomy, adenoidectomy, bilateral myringotomy and tubes; removal of cyst; Shoulder surgery; Bladder surgery; and Cataract extraction.   His family history includes Alzheimer's disease in his father; Coronary artery disease in his mother; Diabetes in his sister; Heart disease in his mother; Heart disease (age of onset: 49) in his sister; Hypertension in his daughter, mother, and son.He reports that he quit smoking about 27 years ago. His smoking use included cigarettes. He has a 6.00 pack-year smoking history. He has never used smokeless tobacco. He reports previous drug use. Drug: Marijuana. He reports that he does not drink alcohol.  No current facility-administered medications on file prior to visit.   Current Outpatient Medications on File Prior to Visit  Medication Sig Dispense Refill  . atorvastatin (LIPITOR) 40 MG tablet Take 1 tablet (40 mg total) by mouth daily. 30 tablet 2  . diltiazem (MATZIM LA) 420 MG 24 hr tablet Take 1 tablet (420 mg total) by mouth daily. 90 tablet 1  . dorzolamide-timolol (COSOPT) 22.3-6.8 MG/ML ophthalmic solution 1 drop 2 (two) times daily.    . mirtazapine (REMERON) 15 MG tablet Take 1 tablet (15 mg total) by mouth at bedtime. 90 tablet 3     Objective:  Objective  Physical Exam Vitals and nursing note reviewed.  Constitutional:      General: He is sleeping.     Appearance: He is well-developed.  HENT:     Head: Normocephalic and atraumatic.  Eyes:     Pupils: Pupils are equal, round, and reactive to light.  Neck:  Thyroid: No thyromegaly.  Cardiovascular:     Rate and Rhythm: Normal rate and regular rhythm.     Heart sounds: No murmur heard.   Pulmonary:     Effort: Pulmonary effort is normal. No respiratory distress.     Breath sounds: Normal breath sounds. No wheezing or rales.  Chest:     Chest wall: No tenderness.  Musculoskeletal:        General: No  tenderness.     Cervical back: Normal range of motion and neck supple.  Skin:    General: Skin is warm and dry.  Neurological:     Mental Status: He is lethargic and disoriented.     Motor: Weakness present.     Gait: Gait abnormal.     Comments: Pt with slurred speech and very lethargic  Fell asleep several times in wheelchair  L leg weakness with ext-- difficult to assess pt extremely lethargic   Psychiatric:        Behavior: Behavior normal.        Thought Content: Thought content normal.        Judgment: Judgment normal.    BP (!) 140/100 (BP Location: Right Arm, Patient Position: Sitting, Cuff Size: Normal)   Pulse (!) 48   Temp 98.8 F (37.1 C) (Oral)   Resp 18   Ht 5\' 9"  (1.753 m)   SpO2 97%   BMI 19.85 kg/m  Wt Readings from Last 3 Encounters:  11/25/20 132 lb (59.9 kg)  11/04/20 134 lb 6.4 oz (61 kg)  08/04/20 130 lb 3.2 oz (59.1 kg)     Lab Results  Component Value Date   WBC 6.9 11/25/2020   HGB 12.8 (L) 11/25/2020   HCT 38.7 (L) 11/25/2020   PLT 216 11/25/2020   GLUCOSE 84 11/25/2020   CHOL 163 11/04/2020   TRIG 79.0 11/04/2020   HDL 53.30 11/04/2020   LDLDIRECT 170.2 10/09/2007   LDLCALC 94 11/04/2020   ALT 9 11/25/2020   AST 16 11/25/2020   NA 139 11/25/2020   K 3.6 11/25/2020   CL 106 11/25/2020   CREATININE 1.51 (H) 11/25/2020   BUN 18 11/25/2020   CO2 26 11/25/2020   TSH 1.71 05/01/2019   PSA 31.37 (H) 04/06/2016   INR 1.0 11/25/2020   HGBA1C 5.1 04/02/2013   MICROALBUR 9.4 (H) 10/06/2015    MR Brain Wo Contrast  Result Date: 08/20/2020 CLINICAL DATA:  Memory Loss, dementia EXAM: MRI HEAD WITHOUT CONTRAST TECHNIQUE: Multiplanar, multiecho pulse sequences of the brain and surrounding structures were obtained without intravenous contrast. COMPARISON:  01/18/2006 and prior. FINDINGS: Brain: No diffusion-weighted signal abnormality. No intracranial hemorrhage. No midline shift, ventriculomegaly or extra-axial fluid collection. No mass lesion.  Mild cerebral atrophy with ex vacuo dilatation. Minimal chronic microvascular ischemic changes. Remote left thalamic lacunar insult. Vascular: Proximally preserved major intracranial flow voids. Skull and upper cervical spine: Normal marrow signal. Sinuses/Orbits: Sequela of bilateral lens replacement and glaucoma shunts. Mild left maxillary sinus mucosal thickening. Clear mastoid air cells. Other: None. IMPRESSION: 1. No acute intracranial abnormality. Chronic left thalamic lacunar insult. 2. Mild cerebral atrophy and minimal chronic microvascular ischemic changes. Electronically Signed   By: Primitivo Gauze M.D.   On: 08/20/2020 14:24     Assessment & Plan:  Plan  I have discontinued [name] [name]'s metoprolol succinate. I have also changed his cloNIDine. Additionally, I am having him start on metoprolol succinate and losartan. Lastly, I am having him maintain his dorzolamide-timolol, atorvastatin, diltiazem, and  mirtazapine.  Meds ordered this encounter  Medications  . metoprolol succinate (TOPROL-XL) 25 MG 24 hr tablet    Sig: Take 1 tablet (25 mg total) by mouth daily.    Dispense:  90 tablet    Refill:  3  . losartan (COZAAR) 50 MG tablet    Sig: Take 1 tablet (50 mg total) by mouth daily.    Dispense:  30 tablet    Refill:  3  . cloNIDine (CATAPRES) 0.1 MG tablet    Sig: Take 1 tablet (0.1 mg total) by mouth daily.    Dispense:  90 tablet    Refill:  1    Problem List Items Addressed This Visit      Unprioritized   Essential hypertension    Due to weakness and ms change and bradycardia dec toprol back to 25 mg and cut clonidine to 1 a day  Add losartan 50 mg daily Pt going to er for eval due to sudden mental status change       Relevant Medications   metoprolol succinate (TOPROL-XL) 25 MG 24 hr tablet   losartan (COZAAR) 50 MG tablet   cloNIDine (CATAPRES) 0.1 MG tablet    Other Visit Diagnoses    Primary hypertension    -  Primary   Relevant Medications    metoprolol succinate (TOPROL-XL) 25 MG 24 hr tablet   losartan (COZAAR) 50 MG tablet   cloNIDine (CATAPRES) 0.1 MG tablet   Weakness       Relevant Orders   CBC with Differential/Platelet   Comprehensive metabolic panel   TSH   Vitamin B12    d/w er--- pt sent to er for eval   Follow-up: No follow-ups on file.  [name], DO

## 2020-11-25 NOTE — ED Notes (Signed)
Tele Neuro in progress

## 2020-11-25 NOTE — ED Notes (Signed)
Patient transported to CT 

## 2020-11-25 NOTE — H&P (Signed)
History and Physical    KINDRICK LANKFORD BMW:[id number] DOB: Aug 02, 1932 DOA: 11/25/2020  PCP: [name], DO  Patient coming from: Home  Chief Complaint: AMS  HPI: VIKASH NEST is a 87 y.o. male with medical history significant of HTN, HLD, hx of bradycardia, non-obstructive CAD, cardiomyopathy who presents with acute slumping over on the AM of presentation. Pt is currently somewhat confused and pt's wife is not present and currently unable to be reached over phone, thus majority of history is obtained per records. Per documentation, pt was being driven to doctor's office visit on the AM of presentation. En route, pt had "slumped over" with increased confusion and increased slurring of speech. Pt was subsequently brought to ED for further work up.  ED Course: In the ED, a CODE stroke was called given the acuity of pt's symptoms. Pt was found to be hypertensive. Pt underwent head CT which was found to be unremarkable. Pt was determined by Neuro to not be a TPA candidate. Neurology has since recommended medical admit for further work up. Hospitalist service consulted for consideration for admission.  Review of Systems:  Review of Systems  Constitutional: Negative for chills and fever.  HENT: Negative for congestion, ear pain and tinnitus.   Eyes: Negative for photophobia, pain and discharge.  Respiratory: Negative for cough, shortness of breath and wheezing.   Cardiovascular: Negative for chest pain, orthopnea and leg swelling.  Gastrointestinal: Negative for abdominal pain, nausea and vomiting.  Genitourinary: Negative for flank pain, frequency and hematuria.  Musculoskeletal: Negative for back pain, joint pain and neck pain.  Neurological: Negative for tremors, sensory change and focal weakness.  Psychiatric/Behavioral: Positive for memory loss. Negative for hallucinations. The patient is not nervous/anxious.     Past Medical History:  Diagnosis Date  . Abdominal pain  05/19/2010  . Abnormal EKG 04/10/2009  . Acute cholecystitis 05/22/2010  . BPH without obstruction/lower urinary tract symptoms 12/08/2006  . Bradycardia 04/21/2012  . Coronary atherosclerosis of native coronary artery 04/07/2010  . Disorder resulting from impaired renal function 04/07/2007  . Elevated PSA   . Essential hypertension 12/08/2006  . Hearing loss 12/08/2006   no utilized hearing aids  . Hip pain, left 04/29/2008  . Hyperlipidemia 12/08/2006   LDL goal <100  . 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 Mild neurocognitive disorder due to Alzheimer's disease, possible 09/26/2020  . Primary open-angle glaucoma, severe stage 12/29/2006  . Renal insufficiency   . Weight loss observed 06/19/2008    Past Surgical History:  Procedure Laterality Date  . BLADDER SURGERY    . CATARACT EXTRACTION    . CHOLECYSTECTOMY  03/2010   [facility] surgery  . COLONOSCOPY    . removal of cyst     right arm  . SHOULDER SURGERY     cyst removed left shoulder  . TONSILECTOMY, ADENOIDECTOMY, BILATERAL MYRINGOTOMY AND TUBES    . TRANSURETHRAL RESECTION OF PROSTATE       reports that he quit smoking about 27 years ago. His smoking use included cigarettes. He has a 6.00 pack-year smoking history. He has never used smokeless tobacco. He reports previous drug use. Drug: Marijuana. He reports that he does not drink alcohol.  Allergies  Allergen Reactions  . Latanoprost Other (See Comments)    Eye irritation    Family History  Problem Relation Age of Onset  . Coronary artery disease Mother   . Hypertension Mother   . Heart  disease Mother   . Diabetes Sister   . Heart disease Sister 38       died during cabg  . Hypertension Daughter   . Hypertension Son   . Alzheimer's disease Father     Prior to Admission medications   Medication Sig Start Date End Date Taking? Authorizing Provider  diltiazem (MATZIM LA) 420 MG 24 hr tablet Take 1 tablet (420 mg  total) by mouth daily. 11/04/20  Yes Roma Schanz R, DO  dorzolamide-timolol (COSOPT) 22.3-6.8 MG/ML ophthalmic solution 1 drop 2 (two) times daily.   Yes [provider]  metoprolol succinate (TOPROL-XL) 25 MG 24 hr tablet Take 1 tablet (25 mg total) by mouth daily. 11/25/20  Yes Roma Schanz R, DO  atorvastatin (LIPITOR) 40 MG tablet Take 1 tablet (40 mg total) by mouth daily. 09/29/18   [name], DO  cloNIDine (CATAPRES) 0.1 MG tablet Take 1 tablet (0.1 mg total) by mouth daily. 11/25/20   [name], DO  losartan (COZAAR) 50 MG tablet Take 1 tablet (50 mg total) by mouth daily. 11/25/20   [name], DO  mirtazapine (REMERON) 15 MG tablet Take 1 tablet (15 mg total) by mouth at bedtime. 11/04/20   [name], DO    Physical Exam: Vitals:   11/25/20 1230 11/25/20 1315 11/25/20 1400 11/25/20 1611  BP: (!) 207/87 (!) 190/85 (!) 169/79 (!) 165/71  Pulse: 69 (!) 53 (!) 43 (!) 42  Resp: 18 11 (!) 21 18  Temp:    98.6 F (37 C)  TempSrc:      SpO2: 100% 98% 100% 100%  Weight:      Height:        Constitutional: NAD, calm, comfortable Vitals:   11/25/20 1230 11/25/20 1315 11/25/20 1400 11/25/20 1611  BP: (!) 207/87 (!) 190/85 (!) 169/79 (!) 165/71  Pulse: 69 (!) 53 (!) 43 (!) 42  Resp: 18 11 (!) 21 18  Temp:    98.6 F (37 C)  TempSrc:      SpO2: 100% 98% 100% 100%  Weight:      Height:       Eyes: PERRL, lids and conjunctivae normal ENMT: Mucous membranes are moist. External ears appear normal Neck: normal, supple, no masses, no thyromegaly Respiratory: clear to auscultation bilaterally, no wheezing, no crackles. Normal respiratory effort. No accessory muscle use.  Cardiovascular: Regular, s2, s2 Abdomen: no tenderness, no masses palpated. No hepatosplenomegaly. Bowel sounds positive.  Musculoskeletal: no clubbing / cyanosis. No joint deformity upper and lower extremities. Good ROM, no contractures. Normal muscle  tone.  Skin: no rashes, lesions, ulcers. No induration Neurologic: CN 2-12 grossly intact. Sensation intact, strength appears to be intact in all extremities Psychiatric: Normal judgment and insightormal mood.    Labs on Admission: I have personally reviewed following labs and imaging studies  CBC: Recent Labs  Lab 11/25/20 1000  WBC 6.9  NEUTROABS 5.0  HGB 12.8*  HCT 38.7*  MCV 92.6  PLT 409   Basic Metabolic Panel: Recent Labs  Lab 11/25/20 1000  NA 139  K 3.6  CL 106  CO2 26  GLUCOSE 84  BUN 18  CREATININE 1.51*  CALCIUM 8.8*   GFR: Estimated Creatinine Clearance: 29.8 mL/min (A) (by C-G formula based on SCr of 1.51 mg/dL (H)). Liver Function Tests: Recent Labs  Lab 11/25/20 1000  AST 16  ALT 9  ALKPHOS 49  BILITOT 1.1  PROT 7.2  ALBUMIN 3.5   No results for input(s): LIPASE, AMYLASE in the last 168 hours. No results for input(s): AMMONIA in the last 168 hours. Coagulation Profile: Recent Labs  Lab 11/25/20 1000  INR 1.0   Cardiac Enzymes: No results for input(s): CKTOTAL, CKMB, CKMBINDEX, TROPONINI in the last 168 hours. BNP (last 3 results) No results for input(s): PROBNP in the last 8760 hours. HbA1C: No results for input(s): HGBA1C in the last 72 hours. CBG: Recent Labs  Lab 11/25/20 0957 11/25/20 1023  GLUCAP 65* 154*   Lipid Profile: No results for input(s): CHOL, HDL, LDLCALC, TRIG, CHOLHDL, LDLDIRECT in the last 72 hours. Thyroid Function Tests: No results for input(s): TSH, T4TOTAL, FREET4, T3FREE, THYROIDAB in the last 72 hours. Anemia Panel: No results for input(s): VITAMINB12, FOLATE, FERRITIN, TIBC, IRON, RETICCTPCT in the last 72 hours. Urine analysis:    Component Value Date/Time   COLORURINE YELLOW 11/25/2020 1024   APPEARANCEUR CLEAR 11/25/2020 1024   LABSPEC 1.010 11/25/2020 1024   PHURINE 7.0 11/25/2020 1024   GLUCOSEU >=500 (A) 11/25/2020 1024   HGBUR NEGATIVE 11/25/2020 1024   HGBUR negative 05/19/2010 1106    BILIRUBINUR NEGATIVE 11/25/2020 1024   BILIRUBINUR trace 10/06/2015 1030   KETONESUR NEGATIVE 11/25/2020 1024   PROTEINUR NEGATIVE 11/25/2020 1024   UROBILINOGEN negative 10/06/2015 1030   UROBILINOGEN 0.2 05/19/2010 1106   NITRITE NEGATIVE 11/25/2020 1024   LEUKOCYTESUR NEGATIVE 11/25/2020 1024   Sepsis Labs: !!!!!!!!!!!!!!!!!!!!!!!!!!!!!!!!!!!!!!!!!!!! @LABRCNTIP (procalcitonin:4,lacticidven:4) ) Recent Results (from the past 240 hour(s))  Resp Panel by RT-PCR (Flu A&B, Covid) Nasopharyngeal Swab     Status: None   Collection Time: 11/25/20 10:34 AM   Specimen: Nasopharyngeal Swab; Nasopharyngeal(NP) swabs in vial transport medium  Result Value Ref Range Status   SARS Coronavirus 2 by RT PCR NEGATIVE NEGATIVE Final    Comment: (NOTE) SARS-CoV-2 target nucleic acids are NOT DETECTED.  The SARS-CoV-2 RNA is generally detectable in upper respiratory specimens during the acute phase of infection. The lowest concentration of SARS-CoV-2 viral copies this assay can detect is 138 copies/mL. A negative result does not preclude SARS-Cov-2 infection and should not be used as the sole basis for treatment or other patient management decisions. A negative result may occur with  improper specimen collection/handling, submission of specimen other than nasopharyngeal swab, presence of viral mutation(s) within the areas targeted by this assay, and inadequate number of viral copies(<138 copies/mL). A negative result must be combined with clinical observations, patient history, and epidemiological information. The expected result is Negative.  Fact Sheet for Patients:  EntrepreneurPulse.com.au  Fact Sheet for Healthcare Providers:  IncredibleEmployment.be  This test is no t yet approved or cleared by the [place] FDA and  has been authorized for detection and/or diagnosis of SARS-CoV-2 by FDA under an Emergency Use Authorization (EUA). This EUA will  remain  in effect (meaning this test can be used) for the duration of the COVID-19 declaration under Section 564(b)(1) of the Act, 21 U.S.C.section 360bbb-3(b)(1), unless the authorization is terminated  or revoked sooner.       Influenza A by PCR NEGATIVE NEGATIVE Final   Influenza B by PCR NEGATIVE NEGATIVE Final    Comment: (NOTE) The Xpert Xpress SARS-CoV-2/FLU/RSV plus assay is intended as an aid in the diagnosis of influenza from Nasopharyngeal swab specimens and should not be used as a sole basis for treatment. Nasal washings and aspirates are unacceptable for Xpert Xpress SARS-CoV-2/FLU/RSV testing.  Fact Sheet for Patients: EntrepreneurPulse.com.au  Fact Sheet for Healthcare Providers: IncredibleEmployment.be  This test is not yet approved or cleared by the [place] and has been authorized for detection and/or diagnosis of SARS-CoV-2 by FDA under an Emergency Use Authorization (EUA). This EUA will remain in effect (meaning this test can be used) for the duration of the COVID-19 declaration under Section 564(b)(1) of the Act, 21 U.S.C. section 360bbb-3(b)(1), unless the authorization is terminated or revoked.  Performed at [facility], [address] [place], [place] [address]      Radiological Exams on Admission: CT HEAD CODE STROKE WO CONTRAST  Result Date: 11/25/2020 CLINICAL DATA:  Code stroke. EXAM: CT HEAD WITHOUT CONTRAST TECHNIQUE: Contiguous axial images were obtained from the base of the skull through the vertex without intravenous contrast. COMPARISON:  None. FINDINGS: Brain: No acute intracranial hemorrhage, mass effect, or edema. Gray-white differentiation is preserved. Minimal patchy hypoattenuation in the supratentorial white matter is nonspecific but may reflect minor chronic microvascular ischemic changes. Chronic left thalamic insult reported on prior MRI is not present. Prominence of the  ventricles and sulci reflects mild generalized parenchymal volume loss. Vascular: No hyperdense vessel. There is intracranial atherosclerotic calcification at the skull base. Skull: Unremarkable. Sinuses/Orbits: No acute abnormality. Other: None. ASPECTS ([place] Stroke Program Early CT Score) - Ganglionic level infarction (caudate, lentiform nuclei, internal capsule, insula, M1-M3 cortex): 7 - Supraganglionic infarction (M4-M6 cortex): 3 Total score (0-10 with 10 being normal): 10 IMPRESSION: There is no acute intracranial hemorrhage or evidence of acute infarction. ASPECT score is 10. These results were called by telephone at the time of interpretation on 11/25/2020 at 10:15 am to provider Sherwood Gambler , who verbally acknowledged these results. Electronically Signed   By: Macy Mis M.D.   On: 11/25/2020 10:20   CT ANGIO HEAD CODE STROKE  Result Date: 11/25/2020 CLINICAL DATA:  Neuro deficit, acute, stroke suspected. Additional provided: Weakness, slumped over in car while headed to doctor's appointment, low blood sugar, stroke suspected. EXAM: CT ANGIOGRAPHY HEAD AND NECK TECHNIQUE: Multidetector CT imaging of the head and neck was performed using the standard protocol during bolus administration of intravenous contrast. Multiplanar CT image reconstructions and MIPs were obtained to evaluate the vascular anatomy. Carotid stenosis measurements (when applicable) are obtained utilizing NASCET criteria, using the distal internal carotid diameter as the denominator. CONTRAST:  48mL OMNIPAQUE IOHEXOL 350 MG/ML SOLN COMPARISON:  Noncontrast head CT performed earlier today 11/25/2020. FINDINGS: CTA NECK FINDINGS Aortic arch: Common origin of the innominate and left common carotid arteries. Atherosclerotic plaque within the visualized aortic arch and proximal major branch vessels of the neck. No hemodynamically significant innominate or proximal subclavian artery stenosis. Right carotid system: CCA and ICA patent  within the neck. Soft and calcified plaque within the carotid bifurcation and proximal ICA with less than 50% stenosis. Left carotid system: CCA and ICA patent within the neck. Prominent calcified plaque within the proximal ICA with estimated 70% stenosis (series 7, image 154). Vertebral arteries: Codominant and patent within the neck without stenosis. Nonstenotic calcified plaque at the origin of the left vertebral artery. Skeleton: Cervical spondylosis. No acute bony abnormality or aggressive osseous lesion. Other neck: No neck mass or cervical lymphadenopathy. Subcentimeter right thyroid lobe nodule not meeting consensus criteria for ultrasound follow-up. Upper chest: Ill-defined and linear opacities within the left upper lobe with an appearance most suggestive of scarring on the coronal reconstructions. Review of the MIP images confirms the above findings CTA HEAD FINDING: Anterior circulation: The intracranial internal carotid arteries are patent. Calcified plaque  with vessels with no more than mild stenosis. The M1 middle cerebral arteries are patent. No M2 proxi mal branch occlusion or high-grade proximal stenosis is identified. No intracranial aneurysm is identified. Posterior circulation: The intracranial vertebral arteries are patent. The basilar artery is patent. The posterior cerebral arteries are patent. High-grade focal stenosis within the right PCA at the P2/P3 junction (series 10, image 52). Posterior communicating arteries are hypoplastic or absent bilaterally. Venous sinuses: Within the limitations of contrast timing, no convincing thrombus. Anatomic variants: As described Review of the MIP images confirms the above findings These results were called by telephone at the time of interpretation on 11/25/2020 at 12:25 pm to provider Sherwood Gambler , who verbally acknowledged these results. IMPRESSION: CTA neck: 1. Calcified plaque within the proximal left ICA with estimated 70%. However, this apparent  stenosis could be exaggerated by blooming artifact from calcified plaque. Consider a carotid artery duplex for further quantification of stenosis at this site. 2. Atherosclerotic plaque within the right carotid bifurcation and proximal ICA with less than 50% stenosis. 3. Vertebral arteries patent within the neck without stenosis. CTA neck: 1. No intracranial large vessel occlusion. 2. High-grade focal stenosis within the right posterior cerebral artery at the P2/P3 junction. 3. Atherosclerotic plaque within the intracranial ICAs bilaterally with no more than mild stenosis. Electronically Signed   By: Kellie Simmering DO   On: 11/25/2020 12:25   CT ANGIO NECK CODE STROKE  Result Date: 11/25/2020 CLINICAL DATA:  Neuro deficit, acute, stroke suspected. Additional provided: Weakness, slumped over in car while headed to doctor's appointment, low blood sugar, stroke suspected. EXAM: CT ANGIOGRAPHY HEAD AND NECK TECHNIQUE: Multidetector CT imaging of the head and neck was performed using the standard protocol during bolus administration of intravenous contrast. Multiplanar CT image reconstructions and MIPs were obtained to evaluate the vascular anatomy. Carotid stenosis measurements (when applicable) are obtained utilizing NASCET criteria, using the distal internal carotid diameter as the denominator. CONTRAST:  54mL OMNIPAQUE IOHEXOL 350 MG/ML SOLN COMPARISON:  Noncontrast head CT performed earlier today 11/25/2020. FINDINGS: CTA NECK FINDINGS Aortic arch: Common origin of the innominate and left common carotid arteries. Atherosclerotic plaque within the visualized aortic arch and proximal major branch vessels of the neck. No hemodynamically significant innominate or proximal subclavian artery stenosis. Right carotid system: CCA and ICA patent within the neck. Soft and calcified plaque within the carotid bifurcation and proximal ICA with less than 50% stenosis. Left carotid system: CCA and ICA patent within the neck.  Prominent calcified plaque within the proximal ICA with estimated 70% stenosis (series 7, image 154). Vertebral arteries: Codominant and patent within the neck without stenosis. Nonstenotic calcified plaque at the origin of the left vertebral artery. Skeleton: Cervical spondylosis. No acute bony abnormality or aggressive osseous lesion. Other neck: No neck mass or cervical lymphadenopathy. Subcentimeter right thyroid lobe nodule not meeting consensus criteria for ultrasound follow-up. Upper chest: Ill-defined and linear opacities within the left upper lobe with an appearance most suggestive of scarring on the coronal reconstructions. Review of the MIP images confirms the above findings CTA HEAD FINDING: Anterior circulation: The intracranial internal carotid arteries are patent. Calcified plaque with vessels with no more than mild stenosis. The M1 middle cerebral arteries are patent. No M2 proxi mal branch occlusion or high-grade proximal stenosis is identified. No intracranial aneurysm is identified. Posterior circulation: The intracranial vertebral arteries are patent. The basilar artery is patent. The posterior cerebral arteries are patent. High-grade focal stenosis within the right PCA at  the P2/P3 junction (series 10, image 52). Posterior communicating arteries are hypoplastic or absent bilaterally. Venous sinuses: Within the limitations of contrast timing, no convincing thrombus. Anatomic variants: As described Review of the MIP images confirms the above findings These results were called by telephone at the time of interpretation on 11/25/2020 at 12:25 pm to provider Sherwood Gambler , who verbally acknowledged these results. IMPRESSION: CTA neck: 1. Calcified plaque within the proximal left ICA with estimated 70%. However, this apparent stenosis could be exaggerated by blooming artifact from calcified plaque. Consider a carotid artery duplex for further quantification of stenosis at this site. 2.  Atherosclerotic plaque within the right carotid bifurcation and proximal ICA with less than 50% stenosis. 3. Vertebral arteries patent within the neck without stenosis. CTA neck: 1. No intracranial large vessel occlusion. 2. High-grade focal stenosis within the right posterior cerebral artery at the P2/P3 junction. 3. Atherosclerotic plaque within the intracranial ICAs bilaterally with no more than mild stenosis. Electronically Signed   By: Kellie Simmering DO   On: 11/25/2020 12:25    EKG: Independently reviewed. Sinus  Assessment/Plan Principal Problem:   Acute encephalopathy Active Problems:   Hyperlipidemia LDL goal <100   Essential hypertension   Coronary atherosclerosis of native coronary artery   Bradycardia   Mild neurocognitive disorder due to Alzheimer's disease, possible   1. Acute toxic metabolic encephalopathy 1. Presenting CT head reviewed, unremarkable 2. Neurology has recommended f/u MRI brain w/o contrast, carotid dopplers, 2d echo 3. Will allow permissive HTN for sbp<220 and dbp<110 for now 4. Would f/u on above MRI. If study is worrisome for acute process or CVA, then would formally consult Neurology at that time 5. Have ordered EEG per Neurology recs 6. Will consult PT/OT/SLP 7. Recheck BMET, CBC in AM  8. Check a1c and lipid panel 2. HLD 1. On statin prior to admit 2. Would resume home med once confirmed by pharmacy 3. HTN 1. Allowing permissive HTN for now per above 2. Will cont on PRN hydralazine 3. Would resume meds with exception of av nodal agents, per below 4. Non-occlusive CAD 1. Currently denies chest pain or sob 2. Appears to be stable 3. EKG reviewed, nonischemic 5. Hx bradycardia 1. Patient does have a documented history of bradycardia 2. Current HR is in the 40-50's, at present asymptomatic as pt is conversing appropriately 3. Home med-rec reviewed and pt is noted to on clonidine, diltiazem, as well as metoprolol 4. Would hold av nodal  agents 5. Continue on PRN IV labetalol for HR>120 for now and consider gradually resuming above meds as pt tolerates over time  DVT prophylaxis: Lovenox subq  Code Status: Presumed Full code for now. Pt is somewhat confused and wife cannot be reached over phone at this time Family Communication: Unable to reach pt's wife over phone via number provided in Emergency Contacts  Disposition Plan: Unclear at this time  Consults called:  Admission status: Observation as it would likely require less than 2 midnight stay to rule out acute CVA with MRI and other imaging studies   Marylu Lund MD [facility] Pager On Amion  If 7PM-7AM, please contact night-coverage  11/25/2020, 5:00 PM

## 2020-11-25 NOTE — ED Provider Notes (Signed)
[place] EMERGENCY DEPARTMENT Provider Note   CSN: [id number] Arrival date & time: 11/25/20  0942  LEVEL 5 CAVEAT - ALTERED MENTAL STATUS  History Chief Complaint  Patient presents with  . Altered Mental Status    [name] [name] is a 87 y.o. male.  HPI 87 year old male presents with acute altered mental status. History is provided from the wife.  She states that he woke up normal.  He had an office visit scheduled today so she drove him to the doctor here.  When they left a 20 he was normal.  At some point during the car she noticed that he seemed to slump over she thought he fell asleep.  However then she knows that he was altered.  He seemed more confused than typical, as he does have some memory issues.  However he also seemed to be slurring his words and seemed to be weak to the point that she had to help him walk.  She did not notice focal weakness.  She has not noticed a facial droop.   Past Medical History:  Diagnosis Date  . Abdominal pain 05/19/2010  . Abnormal EKG 04/10/2009  . Acute cholecystitis 05/22/2010  . BPH without obstruction/lower urinary tract symptoms 12/08/2006  . Bradycardia 04/21/2012  . Coronary atherosclerosis of native coronary artery 04/07/2010  . Disorder resulting from impaired renal function 04/07/2007  . Elevated PSA   . Essential hypertension 12/08/2006  . Hearing loss 12/08/2006   no utilized hearing aids  . Hip pain, left 04/29/2008  . Hyperlipidemia 12/08/2006   LDL goal <100  . 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 Mild neurocognitive disorder due to Alzheimer's disease, possible 09/26/2020  . Primary open-angle glaucoma, severe stage 12/29/2006  . Renal insufficiency   . Weight loss observed 06/19/2008    Patient Active Problem List   Diagnosis Date Noted  . Acute encephalopathy 11/25/2020  . Mild neurocognitive disorder due to Alzheimer's disease, possible 09/26/2020  . Lacunar infarct  08/20/2020  . Bradycardia 04/21/2012  . Flatulence, eructation, and gas pain 05/19/2010  . Abdominal pain 05/19/2010  . Coronary atherosclerosis of native coronary artery 04/07/2010  . Elevated PSA 12/06/2009  . Benign neoplasm of skin, site unspecified 11/24/2009  . Abnormal EKG 04/10/2009  . Weight loss observed 06/19/2008  . Hip pain, left 04/29/2008  . Disorder resulting from impaired renal function 04/07/2007  . Primary open-angle glaucoma, severe stage 12/29/2006  . Hyperlipidemia LDL goal <100 12/08/2006  . Hearing loss 12/08/2006  . Essential hypertension 12/08/2006  . BPH without obstruction/lower urinary tract symptoms 12/08/2006    Past Surgical History:  Procedure Laterality Date  . BLADDER SURGERY    . CATARACT EXTRACTION    . CHOLECYSTECTOMY  03/2010   [facility] [place] surgery  . COLONOSCOPY    . removal of cyst     right arm  . SHOULDER SURGERY     cyst removed left shoulder  . TONSILECTOMY, ADENOIDECTOMY, BILATERAL MYRINGOTOMY AND TUBES    . TRANSURETHRAL RESECTION OF PROSTATE         Family History  Problem Relation Age of Onset  . Coronary artery disease Mother   . Hypertension Mother   . Heart disease Mother   . Diabetes Sister   . Heart disease Sister 28       died during cabg  . Hypertension Daughter   . Hypertension Son   . Alzheimer's disease Father  Social History   Tobacco Use  . Smoking status: Former Smoker    Packs/day: 0.30    Years: 20.00    Pack years: 6.00    Types: Cigarettes    Quit date: 02/22/1993    Years since quitting: 27.7  . Smokeless tobacco: Never Used  Substance Use Topics  . Alcohol use: No  . Drug use: Not Currently    Types: Marijuana    Comment: past marijuana use; none currently    Home Medications Prior to Admission medications   Medication Sig Start Date End Date Taking? Authorizing Provider  diltiazem (MATZIM LA) 420 MG 24 hr tablet Take 1 tablet (420 mg total) by mouth daily. 11/04/20  Yes Roma Schanz R, DO  dorzolamide-timolol (COSOPT) 22.3-6.8 MG/ML ophthalmic solution 1 drop 2 (two) times daily.   Yes [provider]  metoprolol succinate (TOPROL-XL) 25 MG 24 hr tablet Take 1 tablet (25 mg total) by mouth daily. 11/25/20  Yes Roma Schanz R, DO  atorvastatin (LIPITOR) 40 MG tablet Take 1 tablet (40 mg total) by mouth daily. 09/29/18   [name], DO  cloNIDine (CATAPRES) 0.1 MG tablet Take 1 tablet (0.1 mg total) by mouth daily. 11/25/20   [name], DO  losartan (COZAAR) 50 MG tablet Take 1 tablet (50 mg total) by mouth daily. 11/25/20   [name], DO  mirtazapine (REMERON) 15 MG tablet Take 1 tablet (15 mg total) by mouth at bedtime. 11/04/20   [name], DO    Allergies    Latanoprost  Review of Systems   Review of Systems  Unable to perform ROS: Mental status change    Physical Exam Updated Vital Signs BP (!) 190/85 (BP Location: Right Arm)   Pulse (!) 53   Temp (!) 97.3 F (36.3 C) (Tympanic)   Resp 11   Ht 5\' 9"  (1.753 m)   Wt 59.9 kg   SpO2 98%   BMI 19.49 kg/m   Physical Exam Vitals and nursing note reviewed.  Constitutional:      Appearance: He is well-developed.     Comments: Patient is resting with eyes closed, but easily awakens to voice  HENT:     Head: Normocephalic and atraumatic.     Right Ear: External ear normal.     Left Ear: External ear normal.     Nose: Nose normal.  Eyes:     General:        Right eye: No discharge.        Left eye: No discharge.     Comments: Left pupils is miotic. Right pupils is normal sized and reactive. Vision testing is difficulty due to his bilateral glaucoma  Cardiovascular:     Rate and Rhythm: Regular rhythm. Bradycardia present.     Heart sounds: Normal heart sounds.  Pulmonary:     Effort: Pulmonary effort is normal.     Breath sounds: Normal breath sounds.  Abdominal:     Palpations: Abdomen is soft.     Tenderness: There is no  abdominal tenderness.  Musculoskeletal:     Cervical back: Neck supple.  Skin:    General: Skin is warm and dry.  Neurological:     Mental Status: He is alert.     Comments: Awake, alert, but disoriented (not new per wife). Perhaps a slight right facial droop. Mildly slurred speech. Otherwise has equal strength in all 4 extremities.   Psychiatric:  Mood and Affect: Mood is not anxious.     ED Results / Procedures / Treatments   Labs (all labs ordered are listed, but only abnormal results are displayed) Labs Reviewed  CBC - Abnormal; Notable for the following components:      Result Value   RBC 4.18 (*)    Hemoglobin 12.8 (*)    HCT 38.7 (*)    All other components within normal limits  COMPREHENSIVE METABOLIC PANEL - Abnormal; Notable for the following components:   Creatinine, Ser 1.51 (*)    Calcium 8.8 (*)    GFR, Estimated 45 (*)    All other components within normal limits  URINALYSIS, ROUTINE W REFLEX MICROSCOPIC - Abnormal; Notable for the following components:   Glucose, UA >=500 (*)    All other components within normal limits  URINALYSIS, MICROSCOPIC (REFLEX) - Abnormal; Notable for the following components:   Bacteria, UA RARE (*)    All other components within normal limits  CBG MONITORING, ED - Abnormal; Notable for the following components:   Glucose-Capillary 65 (*)    All other components within normal limits  CBG MONITORING, ED - Abnormal; Notable for the following components:   Glucose-Capillary 154 (*)    All other components within normal limits  RESP PANEL BY RT-PCR (FLU A&B, COVID) ARPGX2  ETHANOL  PROTIME-INR  APTT  DIFFERENTIAL  RAPID URINE DRUG SCREEN, HOSP PERFORMED    EKG EKG Interpretation  Date/Time:  Tuesday November 25 2020 09:52:29 EDT Ventricular Rate:  54 PR Interval:    QRS Duration: 95 QT Interval:  569 QTC Calculation: 540 R Axis:   50 Text Interpretation: Sinus rhythm LVH with secondary repolarization abnormality  Prolonged QT interval similar to April 2007 Confirmed by Sherwood Gambler [phone]) on 11/25/2020 10:19:44 AM   Radiology CT HEAD CODE STROKE WO CONTRAST  Result Date: 11/25/2020 CLINICAL DATA:  Code stroke. EXAM: CT HEAD WITHOUT CONTRAST TECHNIQUE: Contiguous axial images were obtained from the base of the skull through the vertex without intravenous contrast. COMPARISON:  None. FINDINGS: Brain: No acute intracranial hemorrhage, mass effect, or edema. Gray-white differentiation is preserved. Minimal patchy hypoattenuation in the supratentorial white matter is nonspecific but may reflect minor chronic microvascular ischemic changes. Chronic left thalamic insult reported on prior MRI is not present. Prominence of the ventricles and sulci reflects mild generalized parenchymal volume loss. Vascular: No hyperdense vessel. There is intracranial atherosclerotic calcification at the skull base. Skull: Unremarkable. Sinuses/Orbits: No acute abnormality. Other: None. ASPECTS ([place] Stroke Program Early CT Score) - Ganglionic level infarction (caudate, lentiform nuclei, internal capsule, insula, M1-M3 cortex): 7 - Supraganglionic infarction (M4-M6 cortex): 3 Total score (0-10 with 10 being normal): 10 IMPRESSION: There is no acute intracranial hemorrhage or evidence of acute infarction. ASPECT score is 10. These results were called by telephone at the time of interpretation on 11/25/2020 at 10:15 am to provider Sherwood Gambler , who verbally acknowledged these results. Electronically Signed   By: Macy Mis M.D.   On: 11/25/2020 10:20   CT ANGIO HEAD CODE STROKE  Result Date: 11/25/2020 CLINICAL DATA:  Neuro deficit, acute, stroke suspected. Additional provided: Weakness, slumped over in car while headed to doctor's appointment, low blood sugar, stroke suspected. EXAM: CT ANGIOGRAPHY HEAD AND NECK TECHNIQUE: Multidetector CT imaging of the head and neck was performed using the standard protocol during bolus  administration of intravenous contrast. Multiplanar CT image reconstructions and MIPs were obtained to evaluate the vascular anatomy. Carotid stenosis measurements (when applicable) are  obtained utilizing NASCET criteria, using the distal internal carotid diameter as the denominator. CONTRAST:  42mL OMNIPAQUE IOHEXOL 350 MG/ML SOLN COMPARISON:  Noncontrast head CT performed earlier today 11/25/2020. FINDINGS: CTA NECK FINDINGS Aortic arch: Common origin of the innominate and left common carotid arteries. Atherosclerotic plaque within the visualized aortic arch and proximal major branch vessels of the neck. No hemodynamically significant innominate or proximal subclavian artery stenosis. Right carotid system: CCA and ICA patent within the neck. Soft and calcified plaque within the carotid bifurcation and proximal ICA with less than 50% stenosis. Left carotid system: CCA and ICA patent within the neck. Prominent calcified plaque within the proximal ICA with estimated 70% stenosis (series 7, image 154). Vertebral arteries: Codominant and patent within the neck without stenosis. Nonstenotic calcified plaque at the origin of the left vertebral artery. Skeleton: Cervical spondylosis. No acute bony abnormality or aggressive osseous lesion. Other neck: No neck mass or cervical lymphadenopathy. Subcentimeter right thyroid lobe nodule not meeting consensus criteria for ultrasound follow-up. Upper chest: Ill-defined and linear opacities within the left upper lobe with an appearance most suggestive of scarring on the coronal reconstructions. Review of the MIP images confirms the above findings CTA HEAD FINDING: Anterior circulation: The intracranial internal carotid arteries are patent. Calcified plaque with vessels with no more than mild stenosis. The M1 middle cerebral arteries are patent. No M2 proxi mal branch occlusion or high-grade proximal stenosis is identified. No intracranial aneurysm is identified. Posterior  circulation: The intracranial vertebral arteries are patent. The basilar artery is patent. The posterior cerebral arteries are patent. High-grade focal stenosis within the right PCA at the P2/P3 junction (series 10, image 52). Posterior communicating arteries are hypoplastic or absent bilaterally. Venous sinuses: Within the limitations of contrast timing, no convincing thrombus. Anatomic variants: As described Review of the MIP images confirms the above findings These results were called by telephone at the time of interpretation on 11/25/2020 at 12:25 pm to provider Sherwood Gambler , who verbally acknowledged these results. IMPRESSION: CTA neck: 1. Calcified plaque within the proximal left ICA with estimated 70%. However, this apparent stenosis could be exaggerated by blooming artifact from calcified plaque. Consider a carotid artery duplex for further quantification of stenosis at this site. 2. Atherosclerotic plaque within the right carotid bifurcation and proximal ICA with less than 50% stenosis. 3. Vertebral arteries patent within the neck without stenosis. CTA neck: 1. No intracranial large vessel occlusion. 2. High-grade focal stenosis within the right posterior cerebral artery at the P2/P3 junction. 3. Atherosclerotic plaque within the intracranial ICAs bilaterally with no more than mild stenosis. Electronically Signed   By: Kellie Simmering DO   On: 11/25/2020 12:25   CT ANGIO NECK CODE STROKE  Result Date: 11/25/2020 CLINICAL DATA:  Neuro deficit, acute, stroke suspected. Additional provided: Weakness, slumped over in car while headed to doctor's appointment, low blood sugar, stroke suspected. EXAM: CT ANGIOGRAPHY HEAD AND NECK TECHNIQUE: Multidetector CT imaging of the head and neck was performed using the standard protocol during bolus administration of intravenous contrast. Multiplanar CT image reconstructions and MIPs were obtained to evaluate the vascular anatomy. Carotid stenosis measurements (when  applicable) are obtained utilizing NASCET criteria, using the distal internal carotid diameter as the denominator. CONTRAST:  37mL OMNIPAQUE IOHEXOL 350 MG/ML SOLN COMPARISON:  Noncontrast head CT performed earlier today 11/25/2020. FINDINGS: CTA NECK FINDINGS Aortic arch: Common origin of the innominate and left common carotid arteries. Atherosclerotic plaque within the visualized aortic arch and proximal major branch vessels of  the neck. No hemodynamically significant innominate or proximal subclavian artery stenosis. Right carotid system: CCA and ICA patent within the neck. Soft and calcified plaque within the carotid bifurcation and proximal ICA with less than 50% stenosis. Left carotid system: CCA and ICA patent within the neck. Prominent calcified plaque within the proximal ICA with estimated 70% stenosis (series 7, image 154). Vertebral arteries: Codominant and patent within the neck without stenosis. Nonstenotic calcified plaque at the origin of the left vertebral artery. Skeleton: Cervical spondylosis. No acute bony abnormality or aggressive osseous lesion. Other neck: No neck mass or cervical lymphadenopathy. Subcentimeter right thyroid lobe nodule not meeting consensus criteria for ultrasound follow-up. Upper chest: Ill-defined and linear opacities within the left upper lobe with an appearance most suggestive of scarring on the coronal reconstructions. Review of the MIP images confirms the above findings CTA HEAD FINDING: Anterior circulation: The intracranial internal carotid arteries are patent. Calcified plaque with vessels with no more than mild stenosis. The M1 middle cerebral arteries are patent. No M2 proxi mal branch occlusion or high-grade proximal stenosis is identified. No intracranial aneurysm is identified. Posterior circulation: The intracranial vertebral arteries are patent. The basilar artery is patent. The posterior cerebral arteries are patent. High-grade focal stenosis within the right  PCA at the P2/P3 junction (series 10, image 52). Posterior communicating arteries are hypoplastic or absent bilaterally. Venous sinuses: Within the limitations of contrast timing, no convincing thrombus. Anatomic variants: As described Review of the MIP images confirms the above findings These results were called by telephone at the time of interpretation on 11/25/2020 at 12:25 pm to provider Sherwood Gambler , who verbally acknowledged these results. IMPRESSION: CTA neck: 1. Calcified plaque within the proximal left ICA with estimated 70%. However, this apparent stenosis could be exaggerated by blooming artifact from calcified plaque. Consider a carotid artery duplex for further quantification of stenosis at this site. 2. Atherosclerotic plaque within the right carotid bifurcation and proximal ICA with less than 50% stenosis. 3. Vertebral arteries patent within the neck without stenosis. CTA neck: 1. No intracranial large vessel occlusion. 2. High-grade focal stenosis within the right posterior cerebral artery at the P2/P3 junction. 3. Atherosclerotic plaque within the intracranial ICAs bilaterally with no more than mild stenosis. Electronically Signed   By: Kellie Simmering DO   On: 11/25/2020 12:25    Procedures Procedures   Medications Ordered in ED Medications  dextrose 50 % solution 50 mL (50 mLs Intravenous Given 11/25/20 1000)  iohexol (OMNIPAQUE) 350 MG/ML injection 100 mL (80 mLs Intravenous Contrast Given 11/25/20 1136)  aspirin chewable tablet 324 mg (324 mg Oral Given 11/25/20 1315)    ED Course  I have reviewed the triage vital signs and the nursing notes.  Pertinent labs & imaging results that were available during my care of the patient were reviewed by me and considered in my medical decision making (see chart for details).    MDM Rules/Calculators/A&P                          Given the acute nature of his AMS, a code stroke was called. He was found to be quite hypertensive, though this  had mildly improved since arrival. CT head is benign. Neuro recommends against TPA and instead to get CTA and reassess. CTA with significant posterior stenosis. Discussed with Dr. Theda Sers, who recommends aspirin and admission. Patient seems to be back to baseline per wife. Unclear if this was TIA vs Hypertensive  cause such as PRES. Dr. Theda Sers recommends letting BP stay up as long as it is less than 220/110. Discussed with Dr. Tamala Julian for admission to [facility] for workup/treatment. Final Clinical Impression(s) / ED Diagnoses Final diagnoses:  TIA (transient ischemic attack)    Rx / DC Orders ED Discharge Orders    None       Sherwood Gambler, MD 11/25/20 1424

## 2020-11-25 NOTE — Assessment & Plan Note (Signed)
Due to weakness and ms change and bradycardia dec toprol back to 25 mg and cut clonidine to 1 a day  Add losartan 50 mg daily Pt going to er for eval due to sudden mental status change

## 2020-11-25 NOTE — ED Notes (Addendum)
CT held per ED MD until IV access established and D50 given

## 2020-11-25 NOTE — ED Triage Notes (Signed)
Sent from office upstairs.  Per wife around 0830 slumped over in the car and was confused.  Hx of some memory issues but per wife is not himself.  Reports he took his bp meds this morning.  Per nurse upstairs heartrate was 49.

## 2020-11-25 NOTE — Consult Note (Addendum)
[facility] TELEMEDICINE  CONSULT   Date of service: November 25, 2020 Patient Name: [name] [name] MRN:  [id number] DOB:  11-21-1931 Requesting Provider: Regenia Skeeter [facility] Location of the patient: [facility] Reason for consult: "acutely decreased responsiveness"  This consult was provided via telemedicine with 2-way video and audio communication. The patient/family was informed that care would be provided in this way and agreed to receive care in this manner.   History of Present Illness  [name] [name] is a 87 y.o. male with PMHx as reviewed below, cognitive impairment woke normal this morning and while driving with his wife to his PCP ~0830, he suddenly slumped over as if he "fell asleep." His wife noted he seemed to be a little more confused compared to his baseline, slurring his words and seemed to be weak to the point that she had to help him walk to his PCP. He was   The patient was reportedly bradycardic in the low 40s and CBG 65 and he was given 1 amp D5W with some improvement in his wakefulness. Report states he had BP 250/160 and currently BP 209/92 HR56.   Stroke Measures   Last Known Well: 0830 TPA Given: No too mild, exam not strongly suggesting stroke IR Thrombectomy: no mRS: Modified Rankin Scale: 1-No significant post stroke disability and can perform usual duties with stroke symptoms Time of teleneurologist evaluation: 1032      Past Medical History:  Diagnosis Date  . Abdominal pain 05/19/2010  . Abnormal EKG 04/10/2009  . Acute cholecystitis 05/22/2010  . BPH without obstruction/lower urinary tract symptoms 12/08/2006  . Bradycardia 04/21/2012  . Coronary atherosclerosis of native coronary artery 04/07/2010  . Disorder resulting from impaired renal function 04/07/2007  . Elevated PSA   . Essential hypertension 12/08/2006  . Hearing loss 12/08/2006   no utilized hearing aids  . Hip pain, left 04/29/2008  .  Hyperlipidemia 12/08/2006   LDL goal <100  . 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 Mild neurocognitive disorder due to Alzheimer's disease, possible 09/26/2020  . Primary open-angle glaucoma, severe stage 12/29/2006  . Renal insufficiency   . Weight loss observed 06/19/2008        Patient Active Problem List   Diagnosis Date Noted  . Mild neurocognitive disorder due to Alzheimer's disease, possible 09/26/2020  . Lacunar infarct 08/20/2020  . Bradycardia 04/21/2012  . Flatulence, eructation, and gas pain 05/19/2010  . Abdominal pain 05/19/2010  . Coronary atherosclerosis of native coronary artery 04/07/2010  . Elevated PSA 12/06/2009  . Benign neoplasm of skin, site unspecified 11/24/2009  . Abnormal EKG 04/10/2009  . Weight loss observed 06/19/2008  . Hip pain, left 04/29/2008  . Disorder resulting from impaired renal function 04/07/2007  . Primary open-angle glaucoma, severe stage 12/29/2006  . Hyperlipidemia LDL goal <100 12/08/2006  . Hearing loss 12/08/2006  . Essential hypertension 12/08/2006  . BPH without obstruction/lower urinary tract symptoms 12/08/2006         Past Surgical History:  Procedure Laterality Date  . BLADDER SURGERY    . CATARACT EXTRACTION    . CHOLECYSTECTOMY  03/2010   [facility] [place] [facility]  . COLONOSCOPY    . removal of cyst     right arm  . SHOULDER SURGERY     cyst removed left shoulder  . TONSILECTOMY, ADENOIDECTOMY, BILATERAL MYRINGOTOMY AND TUBES    . TRANSURETHRAL RESECTION OF PROSTATE  Family History  Problem Relation Age of Onset  . Coronary artery disease Mother   . Hypertension Mother   . Heart disease Mother   . Diabetes Sister   . Heart disease Sister 3       died during cabg  . Hypertension Daughter   . Hypertension Son   . Alzheimer's disease Father     Social History        Tobacco Use  . Smoking status: Former Smoker     Packs/day: 0.30    Years: 20.00    Pack years: 6.00    Types: Cigarettes    Quit date: 02/22/1993    Years since quitting: 27.7  . Smokeless tobacco: Never Used  Substance Use Topics  . Alcohol use: No  . Drug use: Not Currently    Types: Marijuana    Comment: past marijuana use; none currently    Home Medications        Prior to Admission medications   Medication Sig Start Date End Date Taking? Authorizing Provider  atorvastatin (LIPITOR) 40 MG tablet Take 1 tablet (40 mg total) by mouth daily. 09/29/18   [name], DO  cloNIDine (CATAPRES) 0.1 MG tablet Take 1 tablet (0.1 mg total) by mouth 2 (two) times daily. 11/06/19   [name], DO  diltiazem (MATZIM LA) 420 MG 24 hr tablet Take 1 tablet (420 mg total) by mouth daily. 11/04/20   Roma Schanz R, DO  dorzolamide-timolol (COSOPT) 22.3-6.8 MG/ML ophthalmic solution 1 drop 2 (two) times daily.    [provider]  losartan (COZAAR) 50 MG tablet Take 1 tablet (50 mg total) by mouth daily. 11/25/20   [name], DO  metoprolol succinate (TOPROL-XL) 25 MG 24 hr tablet Take 1 tablet (25 mg total) by mouth daily. 11/25/20   [name], DO  mirtazapine (REMERON) 15 MG tablet Take 1 tablet (15 mg total) by mouth at bedtime. 11/04/20   [name], DO   Allergies            Latanoprost  Review of Systems  Unable to perform ROS: Acute encephalopathy   Vitals   Vitals:   11/25/20 1000 11/25/20 1022 11/25/20 1026  BP:  (!) 219/98   Pulse: (!) 52 (!) 53   Resp: 20 16   Temp:  (!) 97.3 F (36.3 C)   TempSrc:  Tympanic   SpO2: 100% 99%   Weight:   59.9 kg  Height:   _0  (1.753 m)    Body mass index is 19.49 kg/m.  Tele-neuro Exam and NIHSS   General: sitting up in bed, eyes closed, NAD  LOC Responsiveness 1 - Patient is sleepy but opens eyes to voice and interacts appropriately. LOC Questions 0 LOC Commands 0 Horizontal  eye movement 0 Visual field 0 Facial palsy 0 Motor arm - Right arm 0 Motor arm - Left arm 0 Motor leg - Right leg 0 Motor leg - Left leg 0 Limb ataxia 0 Sensory test 0 Language 0 Speech 0 Extinction and inattention 0  NIHSS 1   Other exam findings: follows commands consistently.  Imaging and Labs   CBC:  Recent Labs  Lab 11/25/20 1000  WBC 6.9  NEUTROABS 5.0  HGB 12.8*  HCT 38.7*  MCV 92.6  PLT 222    Basic Metabolic Panel:  Lab Results  Component Value Date   NA 139 11/25/2020   K 3.6 11/25/2020  CO2 26 11/25/2020   GLUCOSE 84 11/25/2020   BUN 18 11/25/2020   CREATININE 1.51 (H) 11/25/2020   CALCIUM 8.8 (L) 11/25/2020   GFRNONAA 45 (L) 11/25/2020   GFRAA (L) 06/26/2010    53        The eGFR has been calculated using the MDRD equation. This calculation has not been validated in all clinical situations. eGFR's persistently <60 mL/min signify possible Chronic Kidney Disease.   Lipid Panel:  Lab Results  Component Value Date   [place] 94 11/04/2020   Results for orders placed during the hospital encounter of 11/25/20  CT HEAD CODE STROKE WO CONTRAST  Narrative CLINICAL DATA:  Code stroke.  EXAM: CT HEAD WITHOUT CONTRAST  TECHNIQUE: Contiguous axial images were obtained from the base of the skull through the vertex without intravenous contrast.  COMPARISON:  None.  FINDINGS: Brain: No acute intracranial hemorrhage, mass effect, or edema. Gray-white differentiation is preserved. Minimal patchy hypoattenuation in the supratentorial white matter is nonspecific but may reflect minor chronic microvascular ischemic changes. Chronic left thalamic insult reported on prior MRI is not present. Prominence of the ventricles and sulci reflects mild generalized parenchymal volume loss.  Vascular: No hyperdense vessel. There is intracranial atherosclerotic calcification at the skull base. Skull: Unremarkable. Sinuses/Orbits: No acute abnormality.  ASPECTS  ([place] Stroke Program Early CT Score) - Ganglionic level infarction (caudate, lentiform nuclei, internal capsule, insula, M1-M3 cortex): 7 - Supraganglionic infarction (M4-M6 cortex): 3 Total score (0-10 with 10 being normal): 10  IMPRESSION: There is no acute intracranial hemorrhage or evidence of acute infarction. ASPECT score is 10.  These results were called by telephone at the time of interpretation on 11/25/2020 at 10:15 am to provider Sherwood Gambler , who verbally acknowledged these results.  Electronically Signed By: Macy Mis M.D. On: 11/25/2020 10:20  Reviewed CTA head and neck which showed no large vessel occlusion. Calcified plaque within the proximal left ICA  estimated <70%. Atherosclerotic plaque within the right carotid bifurcation and proximal ICA with less than 50% stenosis. High-grade focal stenosis within the right posterior cerebral artery at the P2/P3 junction. Atherosclerotic plaque within the intracranial ICAs bilaterally with no more than mild stenosis.  Impression   [name] [name] is a 88 y.o. male with PMH HTN, HLD, probable Alzheimer's disease and bradycardia. His limited tele-neurologic examination is notable for somnolence easily arouses to voice. Exam did not reveal focal neurologic deficits suggesting stroke and NCT head did not show acute ischemic changes. The patient was hypoglycemic and had some response after D5W. A similar presentation may be seen in basilar artery occlusion and recommend CTA head and neck. Discussed with Dr. Regenia Skeeter that based on acuity, decreased wakefulness, elevated BP in setting of vascular risk factors the patient should have CTA head and neck to evaluate vasculature, in particular posterior circulation. Once scan is complete will review scan and discuss further.  CTA head and neck did not show LVO however tight stenosis P2/P3 junction and carotid artery stenosis. The differential diagnosis includes stroke, hypertensive  emergency/encephalopathy, PRES and less likely seizure. Recommended transfer to [facility] for further workup.   Recommendations   - Transfer to [facility] -  MRI brain without contrast. - Carotid artery duplex for further quantification of stenosis at this site. - Recommend TTE. - Recommend labs: HbA1c, lipid panel. - Recommend Statin if LDL > 70 - Aspirin 55m daily. - Clopidogrel 751mdaily for 3 weeks. - Permissive hypertension first 24 h < 220/110.  - Telemetry monitoring  for arrhythmia. - Recommend bedside Swallow screen. - Recommend Stroke education. - Recommend PT/OT/SLP consult. - Routine EEG to eval for any epileptogenic discharges. - Recommend metabolic/infectious workup.   Electronically signed by:  Lynnae Sandhoff, MD Page: [phone] 11/25/2020, 10:47 AM   This patient is critically ill and at significant risk of neurological worsening, death and care requires constant monitoring of vital signs, hemodynamics,respiratory and cardiac monitoring, neurological assessment, discussion with family, other specialists and medical decision making of high complexity. I spent 75 minutes of neurocritical care time  in the care of  this patient. This was time spent independent of any time provided by nurse practitioner or PA.

## 2020-11-25 NOTE — Progress Notes (Signed)
UPDATE: Was later able to speak with wife over phone. Per pt's wife, pt took clonidine and diltiazem around 8am. Around 8:30-8:40am while en route to doctor's office for BP check, pt slumped over "as if he went to sleep." Toprol XL dose was decreased at doctor's office. Pt's wife had great difficulty helping pt up to his feet, prompting ED visit. Pt's wife confirms FULL CODE status.

## 2020-11-26 ENCOUNTER — Observation Stay (HOSPITAL_COMMUNITY): Payer: Medicare Other

## 2020-11-26 ENCOUNTER — Encounter (HOSPITAL_COMMUNITY): Payer: Federal, State, Local not specified - PPO

## 2020-11-26 DIAGNOSIS — Z8673 Personal history of transient ischemic attack (TIA), and cerebral infarction without residual deficits: Secondary | ICD-10-CM | POA: Diagnosis not present

## 2020-11-26 DIAGNOSIS — G309 Alzheimer's disease, unspecified: Secondary | ICD-10-CM

## 2020-11-26 DIAGNOSIS — I2510 Atherosclerotic heart disease of native coronary artery without angina pectoris: Secondary | ICD-10-CM

## 2020-11-26 DIAGNOSIS — F05 Delirium due to known physiological condition: Secondary | ICD-10-CM | POA: Diagnosis not present

## 2020-11-26 DIAGNOSIS — R4182 Altered mental status, unspecified: Secondary | ICD-10-CM | POA: Diagnosis present

## 2020-11-26 DIAGNOSIS — I10 Essential (primary) hypertension: Secondary | ICD-10-CM

## 2020-11-26 DIAGNOSIS — E785 Hyperlipidemia, unspecified: Secondary | ICD-10-CM

## 2020-11-26 DIAGNOSIS — Z8249 Family history of ischemic heart disease and other diseases of the circulatory system: Secondary | ICD-10-CM | POA: Diagnosis not present

## 2020-11-26 DIAGNOSIS — Z20822 Contact with and (suspected) exposure to covid-19: Secondary | ICD-10-CM | POA: Diagnosis present

## 2020-11-26 DIAGNOSIS — G3184 Mild cognitive impairment, so stated: Secondary | ICD-10-CM

## 2020-11-26 DIAGNOSIS — G9340 Encephalopathy, unspecified: Secondary | ICD-10-CM

## 2020-11-26 DIAGNOSIS — Z888 Allergy status to other drugs, medicaments and biological substances status: Secondary | ICD-10-CM | POA: Diagnosis not present

## 2020-11-26 DIAGNOSIS — N1831 Chronic kidney disease, stage 3a: Secondary | ICD-10-CM | POA: Diagnosis present

## 2020-11-26 DIAGNOSIS — I6389 Other cerebral infarction: Secondary | ICD-10-CM

## 2020-11-26 DIAGNOSIS — G928 Other toxic encephalopathy: Secondary | ICD-10-CM | POA: Diagnosis present

## 2020-11-26 DIAGNOSIS — H401193 Primary open-angle glaucoma, unspecified eye, severe stage: Secondary | ICD-10-CM | POA: Diagnosis present

## 2020-11-26 DIAGNOSIS — E162 Hypoglycemia, unspecified: Secondary | ICD-10-CM | POA: Diagnosis present

## 2020-11-26 DIAGNOSIS — I712 Thoracic aortic aneurysm, without rupture: Secondary | ICD-10-CM

## 2020-11-26 DIAGNOSIS — I5020 Unspecified systolic (congestive) heart failure: Secondary | ICD-10-CM | POA: Diagnosis present

## 2020-11-26 DIAGNOSIS — I422 Other hypertrophic cardiomyopathy: Secondary | ICD-10-CM | POA: Diagnosis present

## 2020-11-26 DIAGNOSIS — I130 Hypertensive heart and chronic kidney disease with heart failure and stage 1 through stage 4 chronic kidney disease, or unspecified chronic kidney disease: Secondary | ICD-10-CM | POA: Diagnosis present

## 2020-11-26 DIAGNOSIS — F0281 Dementia in other diseases classified elsewhere with behavioral disturbance: Secondary | ICD-10-CM

## 2020-11-26 DIAGNOSIS — R001 Bradycardia, unspecified: Secondary | ICD-10-CM | POA: Diagnosis present

## 2020-11-26 DIAGNOSIS — Z820 Family history of epilepsy and other diseases of the nervous system: Secondary | ICD-10-CM | POA: Diagnosis not present

## 2020-11-26 DIAGNOSIS — F0280 Dementia in other diseases classified elsewhere without behavioral disturbance: Secondary | ICD-10-CM | POA: Diagnosis present

## 2020-11-26 DIAGNOSIS — N400 Enlarged prostate without lower urinary tract symptoms: Secondary | ICD-10-CM | POA: Diagnosis present

## 2020-11-26 DIAGNOSIS — Z79899 Other long term (current) drug therapy: Secondary | ICD-10-CM | POA: Diagnosis not present

## 2020-11-26 DIAGNOSIS — H9190 Unspecified hearing loss, unspecified ear: Secondary | ICD-10-CM | POA: Diagnosis present

## 2020-11-26 DIAGNOSIS — Z87891 Personal history of nicotine dependence: Secondary | ICD-10-CM | POA: Diagnosis not present

## 2020-11-26 LAB — COMPREHENSIVE METABOLIC PANEL
ALT: 8 U/L (ref 0–44)
AST: 15 U/L (ref 15–41)
Albumin: 3 g/dL — ABNORMAL LOW (ref 3.5–5.0)
Alkaline Phosphatase: 49 U/L (ref 38–126)
Anion gap: 8 (ref 5–15)
BUN: 15 mg/dL (ref 8–23)
CO2: 23 mmol/L (ref 22–32)
Calcium: 8.7 mg/dL — ABNORMAL LOW (ref 8.9–10.3)
Chloride: 107 mmol/L (ref 98–111)
Creatinine, Ser: 1.45 mg/dL — ABNORMAL HIGH (ref 0.61–1.24)
GFR, Estimated: 47 mL/min — ABNORMAL LOW (ref >60)
Glucose, Bld: 72 mg/dL (ref 70–99)
Potassium: 3.3 mmol/L — ABNORMAL LOW (ref 3.5–5.1)
Sodium: 138 mmol/L (ref 135–145)
Total Bilirubin: 1.8 mg/dL — ABNORMAL HIGH (ref 0.3–1.2)
Total Protein: 6.3 g/dL — ABNORMAL LOW (ref 6.5–8.1)

## 2020-11-26 LAB — CBC
HCT: 36 % — ABNORMAL LOW (ref 39.0–52.0)
Hemoglobin: 12.6 g/dL — ABNORMAL LOW (ref 13.0–17.0)
MCH: 31.6 pg (ref 26.0–34.0)
MCHC: 35 g/dL (ref 30.0–36.0)
MCV: 90.2 fL (ref 80.0–100.0)
Platelets: 184 10*3/uL (ref 150–400)
RBC: 3.99 MIL/uL — ABNORMAL LOW (ref 4.22–5.81)
RDW: 12.4 % (ref 11.5–15.5)
WBC: 6.8 10*3/uL (ref 4.0–10.5)
nRBC: 0 % (ref 0.0–0.2)

## 2020-11-26 LAB — LIPID PANEL
Cholesterol: 158 mg/dL (ref 0–200)
HDL: 41 mg/dL (ref >40)
LDL Cholesterol: 101 mg/dL — ABNORMAL HIGH (ref 0–99)
Total CHOL/HDL Ratio: 3.9 RATIO
Triglycerides: 79 mg/dL (ref <150)
VLDL: 16 mg/dL (ref 0–40)

## 2020-11-26 LAB — ECHOCARDIOGRAM COMPLETE
Area-P 1/2: 1.94 cm2
Height: 69 in
S' Lateral: 3.3 cm
Weight: 2112 oz

## 2020-11-26 LAB — HEMOGLOBIN A1C
Hgb A1c MFr Bld: 4.8 % (ref 4.8–5.6)
Mean Plasma Glucose: 91.06 mg/dL

## 2020-11-26 MED ORDER — HALOPERIDOL LACTATE 5 MG/ML IJ SOLN
2.0000 mg | Freq: Four times a day (QID) | INTRAMUSCULAR | Status: DC | PRN
  Administered 2020-11-26: 2 mg via INTRAVENOUS
  Filled 2020-11-26: qty 1

## 2020-11-26 MED ORDER — PERFLUTREN LIPID MICROSPHERE
1.0000 mL | INTRAVENOUS | Status: AC | PRN
  Administered 2020-11-26: 2 mL via INTRAVENOUS
  Filled 2020-11-26: qty 10

## 2020-11-26 MED ORDER — POTASSIUM CHLORIDE CRYS ER 20 MEQ PO TBCR
40.0000 meq | EXTENDED_RELEASE_TABLET | Freq: Once | ORAL | Status: AC
  Administered 2020-11-26: 40 meq via ORAL
  Filled 2020-11-26: qty 2

## 2020-11-26 MED ORDER — HYDRALAZINE HCL 25 MG PO TABS
25.0000 mg | ORAL_TABLET | Freq: Three times a day (TID) | ORAL | Status: DC
  Administered 2020-11-26 – 2020-11-27 (×3): 25 mg via ORAL
  Filled 2020-11-26 (×3): qty 1

## 2020-11-26 MED ORDER — HALOPERIDOL LACTATE 5 MG/ML IJ SOLN
5.0000 mg | Freq: Four times a day (QID) | INTRAMUSCULAR | Status: DC | PRN
  Administered 2020-11-26 – 2020-11-27 (×2): 5 mg via INTRAVENOUS
  Filled 2020-11-26 (×3): qty 1

## 2020-11-26 NOTE — Evaluation (Addendum)
Physical Therapy Evaluation Patient Details Name: [name] [name] MRN: [id number] DOB: 1931-12-03 Today's Date: 11/26/2020   History of Present Illness  Pt is an 87 y/o male admitted after acute episode of slumping over in AM, confusion, and slurring of speech. Found to be hypertensive; CT and MRI negative; EEG unremarkable. PMH significant for HTN, bradycardia, non-obstructive CAD, glaucoma, cardiomyopathy, probable Alzheimer's.  Clinical Impression  Pt admitted with above diagnosis. Pt currently with functional limitations due to the deficits listed below (see PT Problem List). At the time of PT eval pt was able to perform transfers and ambulation with gross min assist for guidance due to low vision as pt not familiar with layout of the room/unit. Pt is likely at/near baseline of function however difficult to truly assess due to cognition. Safety at home and performing ADL's/IADL's is a concern due to cognition and feel he will require 24 hour supervision. Pt will benefit from skilled PT to increase their independence and safety with mobility to allow discharge to the venue listed below.       Follow Up Recommendations Home health PT; 24 hour supervision    Equipment Recommendations  None recommended by PT    Recommendations for Other Services       Precautions / Restrictions Precautions Precautions: Fall Precaution Comments: Low vision Restrictions Weight Bearing Restrictions: No      Mobility  Bed Mobility               General bed mobility comments: OOB in recliner upon entry    Transfers Overall transfer level: Needs assistance Equipment used: None Transfers: Sit to/from Stand Sit to Stand: Min guard         General transfer comment: Hands-on guarding for power-up to full standing position.  Ambulation/Gait Ambulation/Gait assistance: Min assist Gait Distance (Feet): 250 Feet Assistive device: 1 person hand held assist Gait Pattern/deviations: Step-through  pattern;Decreased stride length;Drifts right/left Gait velocity: Decreased Gait velocity interpretation: <1.8 ft/sec, indicate of risk for recurrent falls General Gait Details: Light min assist via HHA provided to guide pt as vision low and pt not familiar with the layout of the room/unit. Occasional posterior lean but no other overt LOB noted.  Stairs Stairs: Yes Stairs assistance: Min guard;Min assist Stair Management: Two rails;Step to pattern;Forwards Number of Stairs: 5 General stair comments: Min guard for balance but min assist to help navigate the unfamiliar practice steps for foot placement due to low vision.  Wheelchair Mobility    Modified Rankin (Stroke Patients Only)       Balance Overall balance assessment: Needs assistance Sitting-balance support: No upper extremity supported;Feet supported Sitting balance-Leahy Scale: Fair     Standing balance support: No upper extremity supported;During functional activity;Single extremity supported Standing balance-Leahy Scale: Fair Standing balance comment: preference to UE support, hand held support dynamically                             Pertinent Vitals/Pain Pain Assessment: No/denies pain    Home Living Family/patient expects to be discharged to:: Private residence Living Arrangements: Spouse/significant other Available Help at Discharge: Family;Available 24 hours/day Type of Home: House Home Access: Stairs to enter   [employer] of Steps: 2 Home Layout: One level Home Equipment: None Additional Comments: pt limited historian due to hx of cognitive deficits (initally reports spouse works, but then voiced "no she is retired like me")    Prior Function Level of Independence: Independent  Comments: reports independent with ADLs, mobility; noted limited historian     Hand Dominance   Dominant Hand: Right    Extremity/Trunk Assessment   Upper Extremity Assessment Upper  Extremity Assessment: Generalized weakness    Lower Extremity Assessment Lower Extremity Assessment: Generalized weakness    Cervical / Trunk Assessment Cervical / Trunk Assessment: Other exceptions Cervical / Trunk Exceptions: Forward head posture with rounded shoulders.  Communication   Communication: No difficulties  Cognition Arousal/Alertness: Awake/alert Behavior During Therapy: WFL for tasks assessed/performed (Very pleasant) Overall Cognitive Status: History of cognitive impairments - at baseline Area of Impairment: Orientation;Memory;Following commands;Awareness;Problem solving;Safety/judgement                 Orientation Level: Disoriented to;Situation;Place   Memory: Decreased short-term memory Following Commands: Follows one step commands consistently;Follows one step commands with increased time;Follows multi-step commands inconsistently Safety/Judgement: Decreased awareness of safety;Decreased awareness of deficits Awareness: Intellectual Problem Solving: Slow processing;Requires verbal cues;Difficulty sequencing General Comments: pt oriented to month/year and self, requires simple 1 step commands, decreased recall, pleasantly confused and requires verbal cueing      General Comments      Exercises     Assessment/Plan    PT Assessment Patient needs continued PT services  PT Problem List Decreased strength;Decreased activity tolerance;Decreased balance;Decreased mobility;Decreased knowledge of use of DME;Decreased safety awareness;Decreased knowledge of precautions       PT Treatment Interventions DME instruction;Stair training;Gait training;Functional mobility training;Therapeutic activities;Therapeutic exercise;Neuromuscular re-education;Patient/family education    PT Goals (Current goals can be found in the Care Plan section)  Acute Rehab PT Goals Patient Stated Goal: none stated PT Goal Formulation: With patient Time For Goal Achievement:  12/03/20 Potential to Achieve Goals: Good    Frequency Min 3X/week   Barriers to discharge        Co-evaluation               AM-PAC PT "6 Clicks" Mobility  Outcome Measure Help needed turning from your back to your side while in a flat bed without using bedrails?: None Help needed moving from lying on your back to sitting on the side of a flat bed without using bedrails?: None Help needed moving to and from a bed to a chair (including a wheelchair)?: A Little Help needed standing up from a chair using your arms (e.g., wheelchair or bedside chair)?: A Little Help needed to walk in hospital room?: A Little Help needed climbing 3-5 steps with a railing? : A Little 6 Click Score: 20    End of Session Equipment Utilized During Treatment: Gait belt Activity Tolerance: Patient tolerated treatment well Patient left: in chair;with call bell/phone within reach;with chair alarm set Nurse Communication: Mobility status PT Visit Diagnosis: Unsteadiness on feet (R26.81)    Time: 3220-2542 PT Time Calculation (min) (ACUTE ONLY): 17 min   Charges:   PT Evaluation $PT Eval Moderate Complexity: 1 Mod          Rolinda Roan, PT, DPT Acute Rehabilitation Services Pager: [phone] Office: [phone]   Thelma Comp 11/26/2020, 3:11 PM

## 2020-11-26 NOTE — Evaluation (Signed)
Occupational Therapy Evaluation Patient Details Name: [name] [name] MRN: [id number] DOB: September 10, 1932 Today's Date: 11/26/2020    History of Present Illness Pt is a 87 y/o male with PMH of HTN, bradycardia, non-obstructive CAD, cardiomyopathy, probable Alzheimer's, presenting with acute slumping over in AM, confusion and slurring of speech.  Found to be hypertensive, CT negative. MRI negative.   Clinical Impression   Patient admitted for above and limited by problem list below, including generalized weakness, decreased activity tolerance, impaired balance and impaired cognition.  He reports independent with ADLs and mobility PTA, reports spouse is home with him 24/7.  He is disoriented to situation and place today, follows simple commands with increased time, presents with decreased recall, problem solving and awareness (pleasantly confused)--known hx of cognitive deficits.  Patient completing ADLs with up to min assist/min guard assist today, transfers and limited in room mobility with min guard assist.  Limited session due to transport arriving during session.  Patient will benefit from further OT services while admitted and after dc at [facility] level given 24/7 assist, but recommend SNF if 24/7 is not available due to cognition/confusion.     Follow Up Recommendations  Home health OT;Supervision/Assistance - 24 hour;SNF (SNF if doesnt have 24/7 assist)    Equipment Recommendations  3 in 1 bedside commode    Recommendations for Other Services       Precautions / Restrictions Precautions Precautions: Fall Restrictions Weight Bearing Restrictions: No      Mobility Bed Mobility               General bed mobility comments: OOB in recliner upon entry    Transfers Overall transfer level: Needs assistance Equipment used: None Transfers: Sit to/from Stand Sit to Stand: Min guard         General transfer comment: min guard to power up and steady    Balance Overall balance  assessment: Needs assistance Sitting-balance support: No upper extremity supported;Feet supported Sitting balance-Leahy Scale: Fair     Standing balance support: No upper extremity supported;During functional activity;Single extremity supported Standing balance-Leahy Scale: Fair Standing balance comment: preference to UE support, hand held support dynamically                           ADL either performed or assessed with clinical judgement   ADL Overall ADL's : Needs assistance/impaired     Grooming: Min guard;Standing   Upper Body Bathing: Set up;Sitting   Lower Body Bathing: Sit to/from stand;Min guard   Upper Body Dressing : Sitting;Set up   Lower Body Dressing: Minimal assistance;Sit to/from stand Lower Body Dressing Details (indicate cue type and reason): able to adjust socks, would need assist to don; min guard sit to stand Toilet Transfer: Min guard;Ambulation Toilet Transfer Details (indicate cue type and reason): hand held assist, simulated from recliner to transport chair         Functional mobility during ADLs: Min guard General ADL Comments: hand held assist for mobility, limited by cognition, balance and weakness     Vision Baseline Vision/History: Wears glasses;Glaucoma Wears Glasses: At all times Patient Visual Report: No change from baseline Additional Comments: impaired vision at baseline, reports "i'm going blind".  Difficulty locating items within visual fields, further assessment required.     Perception     Praxis      Pertinent Vitals/Pain Pain Assessment: No/denies pain     Hand Dominance Right   Extremity/Trunk Assessment Upper Extremity Assessment Upper  Extremity Assessment: Generalized weakness   Lower Extremity Assessment Lower Extremity Assessment: Defer to PT evaluation       Communication Communication Communication: No difficulties   Cognition Arousal/Alertness: Awake/alert Behavior During Therapy: WFL for  tasks assessed/performed Overall Cognitive Status: History of cognitive impairments - at baseline Area of Impairment: Orientation;Memory;Following commands;Awareness;Problem solving;Safety/judgement                 Orientation Level: Disoriented to;Situation;Place   Memory: Decreased short-term memory Following Commands: Follows one step commands consistently;Follows one step commands with increased time;Follows multi-step commands inconsistently Safety/Judgement: Decreased awareness of safety;Decreased awareness of deficits Awareness: Intellectual Problem Solving: Slow processing;Requires verbal cues;Difficulty sequencing General Comments: pt oriented to month/year and self, requires simple 1 step commands, decreased recall, pleasantly confused and requires verbal cueing   General Comments  limited session, transport arrived for ECHO    Exercises     Shoulder Instructions      Home Living Family/patient expects to be discharged to:: Private residence Living Arrangements: Spouse/significant other Available Help at Discharge: Family;Available 24 hours/day Type of Home: House Home Access: Stairs to enter [employer] of Steps: 2   Home Layout: One level     Bathroom Shower/Tub: Occupational psychologist: Standard     Home Equipment: None   Additional Comments: pt limited historian due to hx of cognitive deficits (initally reports spouse works, but then voiced "no she is retired like me")      Prior Functioning/Environment Level of Independence: Independent        Comments: reports independent with ADLs, mobility; noted limited historian        OT Problem List: Decreased strength;Impaired balance (sitting and/or standing);Decreased activity tolerance;Decreased cognition;Decreased safety awareness;Decreased knowledge of use of DME or AE      OT Treatment/Interventions: Self-care/ADL training;DME and/or AE instruction;Therapeutic  exercise;Therapeutic activities;Patient/family education;Balance training    OT Goals(Current goals can be found in the care plan section) Acute Rehab OT Goals Patient Stated Goal: none stated Time For Goal Achievement: 12/10/20 Potential to Achieve Goals: Good  OT Frequency: Min 2X/week   Barriers to D/C:            Co-evaluation              AM-PAC OT "6 Clicks" Daily Activity     Outcome Measure Help from another person eating meals?: None Help from another person taking care of personal grooming?: A Little Help from another person toileting, which includes using toliet, bedpan, or urinal?: A Little Help from another person bathing (including washing, rinsing, drying)?: A Little Help from another person to put on and taking off regular upper body clothing?: A Little Help from another person to put on and taking off regular lower body clothing?: A Little 6 Click Score: 19   End of Session Nurse Communication: Mobility status  Activity Tolerance: Patient tolerated treatment well Patient left: Other (comment) (transport chair with transporter taking pt to ECHO)  OT Visit Diagnosis: Other abnormalities of gait and mobility (R26.89);Muscle weakness (generalized) (M62.81);Other symptoms and signs involving cognitive function                Time: 0956-1006 OT Time Calculation (min): 10 min Charges:  OT General Charges $OT Visit: 1 Visit OT Evaluation $OT Eval Moderate Complexity: 1 Mod  Jolaine Artist, OT Acute Rehabilitation Services Pager [phone] Office [phone]   Delight Stare 11/26/2020, 11:00 AM

## 2020-11-26 NOTE — Procedures (Signed)
Patient Name: [name] [name]  MRN: [id number]  Epilepsy Attending: Lora Havens  Referring Physician/Provider: Dr. Marylu Lund Date: 11/25/2020 Duration: 24.14 mins  Patient history: 87 year old male with altered mental status.  EEG to evaluate for seizures.  Level of alertness: Awake, asleep  AEDs during EEG study: None  Technical aspects: This EEG study was done with scalp electrodes positioned according to the 10-20 International system of electrode placement. Electrical activity was acquired at a sampling rate of 500Hz  and reviewed with a high frequency filter of 70Hz  and a low frequency filter of 1Hz . EEG data were recorded continuously and digitally stored.   Description: The posterior dominant rhythm consists of 8-9 Hz activity of moderate voltage (25-35 uV) seen predominantly in posterior head regions, symmetric and reactive to eye opening and eye closing. Sleep was characterized by sleep spindles (12 to 14 Hz), maximal frontocentral region.   Hyperventilation and photic stimulation were not performed.     IMPRESSION: This study is within normal limits. No seizures or epileptiform discharges were seen throughout the recording.  Chayanne Filippi Barbra Sarks

## 2020-11-26 NOTE — Progress Notes (Signed)
Appropriate Use Committee Chart Review  Chart reviewed by the physician advisor with input from [facility] and the attending MD as needed for review of the appropriateness for SNF referral.  TOC notes, PT/OT/ST notes, nursing notes and physician notes reviewed for medical necessity to determine if the patient's needs are appropriate for short-term rehab to return to a prior level of function versus the likely need for custodial care.  At this time, the patient appears to meet Medicare criteria for SNF placement. Noted to be independent with mobility/ADLs prior to admission.  Lives with spouse.    Recommendations: The patient is SNF appropriate for short-term rehab/skilled nursing interventions. Would begin to discuss contingency planning for LTC given h/o dementia, and risk of deterioration of functional status.  A consult to the Transitions of Care Team has been made to facilitate placement.    Jacquelynn Cree, MD Chief Physician Advisor  11/26/2020 11:46 AM

## 2020-11-26 NOTE — Progress Notes (Signed)
PROGRESS NOTE  [name] [name] ZOX:[id number] DOB: 08/14/1932 DOA: 11/25/2020 PCP: [name], DO   LOS: 0 days   Brief Narrative / Interim history: 87 year old male with history of HTN, HLD, history of bradycardia, nonobstructive CAD, cardiomyopathy who came into the hospital with syncope.  He was being driven to the doctor's office and and route he slumped over with increased confusion) slurring of speech.  Apparently wife also reports that her blood pressure was low at the doctor's office but it often is very high  Subjective / 24h Interval events: Confused this morning, denies any chest pain, no shortness of breath  Assessment & Plan: Principal Problem Acute toxic metabolic encephalopathy, underlying dementia-CT of the head as well as MRI negative for acute findings.  EEG done today and he was out without seizures or epileptiform discharges throughout the recording.  His wife intermittently reports confusion at home and occasional hallucinations.  He remains confused here and definitely there is a component of in-hospital delirium  Active Problems Malignant hypertension, concern for syncope and symptomatic bradycardia-due to bradycardia is on medications were held on discharge, his blood pressure has been fairly decent in the morning but this afternoon is increasing back up into the 811B systolic.  He seems asymptomatic with this.  At home he is on diltiazem, clonidine, metoprolol which all can make him even more bradycardic.  There is a reported hypotensive episode per wife however in the office notes his blood pressure was 140/100.  I consulted cardiology to assist it appears that his PCP was about to cut down clonidine, Toprol and add losartan.  I will start hydralazine right now while awaiting cards input  Nonocclusive CAD-no chest pain or other symptoms  Hyperlipidemia-on statin   Scheduled Meds: . brimonidine  1 drop Both Eyes TID  . dorzolamide-timolol  1 drop Both Eyes  BID  . enoxaparin (LOVENOX) injection  40 mg Subcutaneous Q24H   Continuous Infusions: PRN Meds:.acetaminophen **OR** acetaminophen, hydrALAZINE  Diet Orders (From admission, onward)    Start     Ordered   11/25/20 1645  Diet Heart Room service appropriate? Yes; Fluid consistency: Thin  Diet effective now       Question Answer Comment  Room service appropriate? Yes   Fluid consistency: Thin      11/25/20 1649          DVT prophylaxis: enoxaparin (LOVENOX) injection 40 mg Start: 11/25/20 1800     Code Status: Full Code  Family Communication: wife at bedside   Status is: Observation  The patient will require care spanning > 2 midnights and should be moved to inpatient because: Altered mental status and Inpatient level of care appropriate due to severity of illness uncontrolled blood pressure  Dispo: The patient is from: Home              Anticipated d/c is to: Home              Patient currently is not medically stable to d/c.   Difficult to place patient No  Level of care: Telemetry Medical  Consultants:  Cardiology  Neurology   Procedures:  2D echo: pending  Microbiology  none  Antimicrobials: none    Objective: Vitals:   11/26/20 0020 11/26/20 0419 11/26/20 0750 11/26/20 1150  BP: 118/62 (!) 143/74 (!) 174/89 (!) 214/94  Pulse: (!) 41 91 (!) 52 (!) 55  Resp: 19 15 20 20   Temp: 97.8 F (36.6 C) 98.9 F (37.2 C) 98 F (  36.7 C) 97.9 F (36.6 C)  TempSrc: Oral  Oral Oral  SpO2: 100% 100% 100% 100%  Weight:      Height:        Intake/Output Summary (Last 24 hours) at 11/26/2020 1353 Last data filed at 11/26/2020 0936 Gross per 24 hour  Intake -  Output 350 ml  Net -350 ml   Filed Weights   11/25/20 1026  Weight: 59.9 kg    Examination:  Constitutional: NAD Eyes: no scleral icterus ENMT: Mucous membranes are moist.  Neck: normal, supple Respiratory: clear to auscultation bilaterally, no wheezing, no crackles. Normal respiratory  effort. Cardiovascular: Regular rate and rhythm, no murmurs / rubs / gallops. No LE edema.  Abdomen: non distended, no tenderness. Bowel sounds positive.  Musculoskeletal: no clubbing / cyanosis.  Skin: no rashes Neurologic: CN 2-12 grossly intact. Strength 5/5 in all 4.   Data Reviewed: I have independently reviewed following labs and imaging studies   CBC: Recent Labs  Lab 11/25/20 1000 11/25/20 1742 11/26/20 0409  WBC 6.9 8.2 6.8  NEUTROABS 5.0  --   --   HGB 12.8* 12.7* 12.6*  HCT 38.7* 38.1* 36.0*  MCV 92.6 91.1 90.2  PLT 216 196 992   Basic Metabolic Panel: Recent Labs  Lab 11/25/20 1000 11/25/20 1742 11/26/20 0409  NA 139  --  138  K 3.6  --  3.3*  CL 106  --  107  CO2 26  --  23  GLUCOSE 84  --  72  BUN 18  --  15  CREATININE 1.51* 1.27* 1.45*  CALCIUM 8.8*  --  8.7*   Liver Function Tests: Recent Labs  Lab 11/25/20 1000 11/26/20 0409  AST 16 15  ALT 9 8  ALKPHOS 49 49  BILITOT 1.1 1.8*  PROT 7.2 6.3*  ALBUMIN 3.5 3.0*   Coagulation Profile: Recent Labs  Lab 11/25/20 1000  INR 1.0   HbA1C: Recent Labs    11/26/20 0409  HGBA1C 4.8   CBG: Recent Labs  Lab 11/25/20 0957 11/25/20 1023 11/25/20 2142  GLUCAP 65* 154* 79    Recent Results (from the past 240 hour(s))  Resp Panel by RT-PCR (Flu A&B, Covid) Nasopharyngeal Swab     Status: None   Collection Time: 11/25/20 10:34 AM   Specimen: Nasopharyngeal Swab; Nasopharyngeal(NP) swabs in vial transport medium  Result Value Ref Range Status   SARS Coronavirus 2 by RT PCR NEGATIVE NEGATIVE Final    Comment: (NOTE) SARS-CoV-2 target nucleic acids are NOT DETECTED.  The SARS-CoV-2 RNA is generally detectable in upper respiratory specimens during the acute phase of infection. The lowest concentration of SARS-CoV-2 viral copies this assay can detect is 138 copies/mL. A negative result does not preclude SARS-Cov-2 infection and should not be used as the sole basis for treatment or other  patient management decisions. A negative result may occur with  improper specimen collection/handling, submission of specimen other than nasopharyngeal swab, presence of viral mutation(s) within the areas targeted by this assay, and inadequate number of viral copies(<138 copies/mL). A negative result must be combined with clinical observations, patient history, and epidemiological information. The expected result is Negative.  Fact Sheet for Patients:  EntrepreneurPulse.com.au  Fact Sheet for Healthcare Providers:  IncredibleEmployment.be  This test is no t yet approved or cleared by the [place] FDA and  has been authorized for detection and/or diagnosis of SARS-CoV-2 by FDA under an Emergency Use Authorization (EUA). This EUA will remain  in effect (meaning  this test can be used) for the duration of the COVID-19 declaration under Section 564(b)(1) of the Act, 21 U.S.C.section 360bbb-3(b)(1), unless the authorization is terminated  or revoked sooner.       Influenza A by PCR NEGATIVE NEGATIVE Final   Influenza B by PCR NEGATIVE NEGATIVE Final    Comment: (NOTE) The Xpert Xpress SARS-CoV-2/FLU/RSV plus assay is intended as an aid in the diagnosis of influenza from Nasopharyngeal swab specimens and should not be used as a sole basis for treatment. Nasal washings and aspirates are unacceptable for Xpert Xpress SARS-CoV-2/FLU/RSV testing.  Fact Sheet for Patients: EntrepreneurPulse.com.au  Fact Sheet for Healthcare Providers: IncredibleEmployment.be  This test is not yet approved or cleared by the [place] FDA and has been authorized for detection and/or diagnosis of SARS-CoV-2 by FDA under an Emergency Use Authorization (EUA). This EUA will remain in effect (meaning this test can be used) for the duration of the COVID-19 declaration under Section 564(b)(1) of the Act, 21 U.S.C. section  360bbb-3(b)(1), unless the authorization is terminated or revoked.  Performed at [facility], [address], [place], [place] [address]      Radiology Studies: MR BRAIN WO CONTRAST  Result Date: 11/26/2020 CLINICAL DATA:  Initial evaluation for neuro deficit, stroke suspected. EXAM: MRI HEAD WITHOUT CONTRAST TECHNIQUE: Multiplanar, multiecho pulse sequences of the brain and surrounding structures were obtained without intravenous contrast. COMPARISON:  Prior CTs from 11/25/2020 FINDINGS: Brain: Generalized age-related cerebral atrophy with mild chronic small vessel ischemic disease. No abnormal foci of restricted diffusion to suggest acute or subacute ischemia. Gray-white matter differentiation maintained. No encephalomalacia to suggest chronic cortical infarction. No evidence for acute or chronic intracranial hemorrhage. No mass lesion, midline shift or mass effect. No hydrocephalus or extra-axial fluid collection. Note made of a partially empty sella. Midline structures intact. Vascular: Major intracranial vascular flow voids are maintained. Skull and upper cervical spine: Craniocervical junction within normal limits. Bone marrow signal intensity normal. No scalp soft tissue abnormality. Sinuses/Orbits: Patient status post bilateral ocular lens replacement. Globes and orbital soft tissues demonstrate no acute finding. Paranasal sinuses are largely clear. No mastoid effusion. Inner ear structures grossly normal. Other: None. IMPRESSION: 1. No acute intracranial abnormality. 2. Generalized age-related cerebral atrophy with mild chronic small vessel ischemic disease. Electronically Signed   By: Jeannine Boga M.D.   On: 11/26/2020 03:41   EEG adult  Result Date: 11/26/2020 Lora Havens, MD     11/26/2020  9:19 AM Patient Name: EROS MONTOUR MRN: [id number] Epilepsy Attending: Lora Havens Referring Physician/Provider: Dr. Marylu Lund Date: 11/25/2020 Duration: 24.14 mins  Patient history: 87 year old male with altered mental status.  EEG to evaluate for seizures. Level of alertness: Awake, asleep AEDs during EEG study: None Technical aspects: This EEG study was done with scalp electrodes positioned according to the 10-20 International system of electrode placement. Electrical activity was acquired at a sampling rate of 500Hz  and reviewed with a high frequency filter of 70Hz  and a low frequency filter of 1Hz . EEG data were recorded continuously and digitally stored. Description: The posterior dominant rhythm consists of 8-9 Hz activity of moderate voltage (25-35 uV) seen predominantly in posterior head regions, symmetric and reactive to eye opening and eye closing. Sleep was characterized by sleep spindles (12 to 14 Hz), maximal frontocentral region.   Hyperventilation and photic stimulation were not performed.   IMPRESSION: This study is within normal limits. No seizures or epileptiform discharges were seen throughout the recording.  Lora Havens   ECHOCARDIOGRAM COMPLETE  Result Date: 11/26/2020    ECHOCARDIOGRAM REPORT   Patient Name:   TOSH GLAZE Date of Exam: 11/26/2020 Medical Rec #:  [id number]       Height:       69.0 in Accession #:    [id number]      Weight:       132.0 lb Date of Birth:  09/25/1931        BSA:          1.732 m Patient Age:    52 years        BP:           174/89 mmHg Patient Gender: M               HR:           53 bpm. Exam Location:  Inpatient Procedure: 2D Echo, Cardiac Doppler, Color Doppler and Intracardiac            Opacification Agent Indications:    Stroke  History:        Patient has no prior history of Echocardiogram examinations.                 Risk Factors:Hypertension.  Sonographer:    Merrie Roof RDCS Referring Phys: [place]  1. There is severe asymmetric hypertrophy of the apical segments up to 1.6 cm. Findings could represent apical variant hypertrophic cardiomyopathy and would recommend cardiac MRI for  better characterizationm as some of the images may be foreshortened. There is no apparent LV apical aneurysm or LV thrombus on contrast imaging. Left ventricular ejection fraction, by estimation, is 55 to 60%. The left ventricle has normal function. The left ventricle has no regional wall motion abnormalities. There is severe asymmetric left ventricular hypertrophy of the apical segment. Left ventricular diastolic parameters are consistent with Grade I diastolic dysfunction (impaired relaxation).  2. Right ventricular systolic function is normal. The right ventricular size is normal. Tricuspid regurgitation signal is inadequate for assessing PA pressure.  3. Left atrial size was mildly dilated.  4. The mitral valve is degenerative. Trivial mitral valve regurgitation. No evidence of mitral stenosis.  5. The aortic valve is tricuspid. There is mild thickening of the aortic valve. Aortic valve regurgitation is not visualized. Mild aortic valve sclerosis is present, with no evidence of aortic valve stenosis.  6. Aortic dilatation noted. There is mild dilatation of the aortic root and of the ascending aorta, measuring 41 mm.  7. The inferior vena cava is normal in size with greater than 50% respiratory variability, suggesting right atrial pressure of 3 mmHg. Conclusion(s)/Recommendation(s): No intracardiac source of embolism detected on this transthoracic study. A transesophageal echocardiogram is recommended to exclude cardiac source of embolism if clinically indicated. FINDINGS  Left Ventricle: There is severe asymmetric hypertrophy of the apical segments up to 1.6 cm. Findings could represent apical variant hypertrophic cardiomyopathy and would recommend cardiac MRI for better characterizationm as some of the images may be foreshortened. There is no apparent LV apical aneurysm or LV thrombus on contrast imaging. Left ventricular ejection fraction, by estimation, is 55 to 60%. The left ventricle has normal function.  The left ventricle has no regional wall motion abnormalities. Definity contrast agent was given IV to delineate the left ventricular endocardial borders. The left ventricular internal cavity size was normal in size. There is severe asymmetric left ventricular hypertrophy of the apical segment. Left ventricular diastolic parameters are  consistent with Grade I diastolic dysfunction (impaired relaxation). Right Ventricle: The right ventricular size is normal. No increase in right ventricular wall thickness. Right ventricular systolic function is normal. Tricuspid regurgitation signal is inadequate for assessing PA pressure. Left Atrium: Left atrial size was mildly dilated. Right Atrium: Right atrial size was normal in size. Pericardium: Trivial pericardial effusion is present. Mitral Valve: The mitral valve is degenerative in appearance. There is mild calcification of the posterior mitral valve leaflet(s). Mild mitral annular calcification. Trivial mitral valve regurgitation. No evidence of mitral valve stenosis. Tricuspid Valve: The tricuspid valve is grossly normal. Tricuspid valve regurgitation is trivial. No evidence of tricuspid stenosis. Aortic Valve: The aortic valve is tricuspid. There is mild thickening of the aortic valve. Aortic valve regurgitation is not visualized. Mild aortic valve sclerosis is present, with no evidence of aortic valve stenosis. Pulmonic Valve: The pulmonic valve was grossly normal. Pulmonic valve regurgitation is trivial. No evidence of pulmonic stenosis. Aorta: Aortic dilatation noted. There is mild dilatation of the aortic root and of the ascending aorta, measuring 41 mm. Venous: The inferior vena cava is normal in size with greater than 50% respiratory variability, suggesting right atrial pressure of 3 mmHg. IAS/Shunts: The atrial septum is grossly normal.  LEFT VENTRICLE PLAX 2D LVIDd:         4.30 cm  Diastology LVIDs:         3.30 cm  LV e' medial:    3.15 cm/s LV PW:         0.94  cm  LV E/e' medial:  15.7 LV IVS:        1.45 cm  LV e' lateral:   3.48 cm/s LVOT diam:     2.30 cm  LV E/e' lateral: 14.2 LV SV:         85 LV SV Index:   49 LVOT Area:     4.15 cm  RIGHT VENTRICLE RV Basal diam:  3.00 cm LEFT ATRIUM             Index       RIGHT ATRIUM           Index LA diam:        3.20 cm 1.85 cm/m  RA Area:     11.90 cm LA Vol (A2C):   59.9 ml 34.59 ml/m RA Volume:   21.10 ml  12.19 ml/m LA Vol (A4C):   72.4 ml 41.81 ml/m LA Biplane Vol: 66.4 ml 38.35 ml/m  AORTIC VALVE LVOT Vmax:   116.00 cm/s LVOT Vmean:  63.900 cm/s LVOT VTI:    0.204 m  AORTA Ao Root diam: 4.10 cm Ao Asc diam:  4.10 cm MITRAL VALVE MV Area (PHT): 1.94 cm    SHUNTS MV Decel Time: 391 msec    Systemic VTI:  0.20 m MV E velocity: 49.30 cm/s  Systemic Diam: 2.30 cm MV A velocity: 96.80 cm/s MV E/A ratio:  0.51 Eleonore Chiquito MD Electronically signed by Eleonore Chiquito MD Signature Date/Time: 11/26/2020/11:53:10 AM    Final     Marzetta Board, MD, PhD [facility]  Between 7 am - 7 pm I am available, please contact me via Amion or Securechat  Between 7 pm - 7 am I am not available, please contact night coverage MD/APP via Amion

## 2020-11-26 NOTE — Progress Notes (Signed)
PT Cancellation Note  Patient Details Name: [name] [name] MRN: [id number] DOB: 01-30-1932   Cancelled Treatment:    Reason Eval/Treat Not Completed: Patient at procedure or test/unavailable. Pt currently in ECHO. Will check back as schedule allows to initiate PT evaluation.    Thelma Comp 11/26/2020, 10:15 AM   Rolinda Roan, PT, DPT Acute Rehabilitation Services Pager: [phone] Office: [phone]

## 2020-11-26 NOTE — Consult Note (Signed)
Cardiology Consult Note:   Patient ID: [name] [name] MRN: [id number]; DOB: 01-Mar-1932   Admission date: 11/25/2020  PCP:  Lowne Chase, [place]  Cardiologist:  No primary care provider on file.  Distantly Dr. Percival Spanish Advanced Practice Provider:  No care team member to display Electrophysiologist:  None       Chief Complaint:  Bradycardia  Patient Profile:   [name] [name] is a 87 y.o. male with HFmrEF circa 2010; non obstructive CAD and LAD disease, HTN, CKD Stage IIIa, and dementia, glaucoma.  Consult question is bradycardia and syncope in the setting of significant dementia.  History of Present Illness:   [name] [name] was seeing 11/25/20 after having an event en route to the doctors office.  Per chart review of multiple providers ; patient was found confused and sudden slumped over with slurring of his speech.  Code Stroke was found to be negative.  Planned for EEG; with negative MRI AM of 11/26/20.  Patient notes that he is feeling fine.    Discussed with wife:  Notes that with significant agitation his blood pressure goes sky high:  SBP 250.  Patient doesn't like not being in control, and is having vision issues.  He talks with people who aren't there in the house and in the hospital.  Notes that he has hidden some money in the house because of people who are not there are hiding.  Patient is not easily directable. Notes that he is at the store.  Perseverates on leaving home.    Past Medical History:  Diagnosis Date  . Abdominal pain 05/19/2010  . Abnormal EKG 04/10/2009  . Acute cholecystitis 05/22/2010  . BPH without obstruction/lower urinary tract symptoms 12/08/2006  . Bradycardia 04/21/2012  . Coronary atherosclerosis of native coronary artery 04/07/2010  . Disorder resulting from impaired renal function 04/07/2007  . Elevated PSA   . Essential hypertension 12/08/2006  . Hearing loss 12/08/2006   no utilized hearing aids  .  Hip pain, left 04/29/2008  . Hyperlipidemia 12/08/2006   LDL goal <100  . 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 Mild neurocognitive disorder due to Alzheimer's disease, possible 09/26/2020  . Primary open-angle glaucoma, severe stage 12/29/2006  . Renal insufficiency   . Weight loss observed 06/19/2008    Past Surgical History:  Procedure Laterality Date  . BLADDER SURGERY    . CATARACT EXTRACTION    . CHOLECYSTECTOMY  03/2010   [facility] [place] [facility]  . COLONOSCOPY    . removal of cyst     right arm  . SHOULDER SURGERY     cyst removed left shoulder  . TONSILECTOMY, ADENOIDECTOMY, BILATERAL MYRINGOTOMY AND TUBES    . TRANSURETHRAL RESECTION OF PROSTATE       Medications Prior to Admission: Prior to Admission medications   Medication Sig Start Date End Date Taking? Authorizing Provider  brimonidine (ALPHAGAN) 0.2 % ophthalmic solution Place 1 drop into both eyes in the morning and at bedtime.   Yes [provider]  cloNIDine (CATAPRES) 0.1 MG tablet Take 1 tablet (0.1 mg total) by mouth daily. 11/25/20  Yes Roma Schanz R, DO  diltiazem (MATZIM LA) 420 MG 24 hr tablet Take 1 tablet (420 mg total) by mouth daily. 11/04/20  Yes Roma Schanz R, DO  dorzolamide-timolol (COSOPT) 22.3-6.8 MG/ML ophthalmic solution Place 1 drop into both eyes 2 (two) times daily.   Yes [provider]  metoprolol succinate (TOPROL-XL) 50 MG 24 hr tablet Take 50 mg by mouth daily. Take with or immediately following a meal.   Yes [provider]  atorvastatin (LIPITOR) 40 MG tablet Take 1 tablet (40 mg total) by mouth daily. 09/29/18   [name], DO  losartan (COZAAR) 50 MG tablet Take 1 tablet (50 mg total) by mouth daily. 11/25/20   [name], DO  metoprolol succinate (TOPROL-XL) 25 MG 24 hr tablet Take 1 tablet (25 mg total) by mouth daily. 11/25/20   [name], DO  mirtazapine (REMERON) 15 MG tablet  Take 1 tablet (15 mg total) by mouth at bedtime. Patient not taking: Reported on 11/25/2020 11/04/20   [name], DO     Allergies:    Allergies  Allergen Reactions  . Latanoprost Other (See Comments)    Eye irritation    Social History:   Social History   Socioeconomic History  . Marital status: Married    Spouse name: Not on file  . Number of children: Not on file  . Years of education: 98  . Highest education level: Some college, no degree  Occupational History  . Occupation: retired Actor  Tobacco Use  . Smoking status: Former Smoker    Packs/day: 0.30    Years: 20.00    Pack years: 6.00    Types: Cigarettes    Quit date: 02/22/1993    Years since quitting: 27.7  . Smokeless tobacco: Never Used  Substance and Sexual Activity  . Alcohol use: No  . Drug use: Not Currently    Types: Marijuana    Comment: past marijuana use; none currently  . Sexual activity: Not Currently    Partners: Female  Other Topics Concern  . Not on file  Social History Narrative   Exercise--- stair step,  Sit ups, pull ups and bike   Social Determinants of Health   Financial Resource Strain: Not on file  Food Insecurity: Not on file  Transportation Needs: Not on file  Physical Activity: Not on file  Stress: Not on file  Social Connections: Not on file  Intimate Partner Violence: Not on file    Family History:   The patient's family history includes Alzheimer's disease in his father; Coronary artery disease in his mother; Diabetes in his sister; Heart disease in his mother; Heart disease (age of onset: 55) in his sister; Hypertension in his daughter, mother, and son.   Spoke with Milinda Hirschfeld who is his second wife:  Had four children biologically who neither of HCM or AA  ROS:  Please see the history of present illness.  All other ROS reviewed and negative.     Physical Exam/Data:   Vitals:   11/26/20 0020 11/26/20 0419 11/26/20 0750 11/26/20 1150  BP:  118/62 (!) 143/74 (!) 174/89 (!) 214/94  Pulse: (!) 41 91 (!) 52 (!) 55  Resp: 19 15 20 20   Temp: 97.8 F (36.6 C) 98.9 F (37.2 C) 98 F (36.7 C) 97.9 F (36.6 C)  TempSrc: Oral  Oral Oral  SpO2: 100% 100% 100% 100%  Weight:      Height:        Intake/Output Summary (Last 24 hours) at 11/26/2020 1440 Last data filed at 11/26/2020 0936 Gross per 24 hour  Intake --  Output 350 ml  Net -350 ml   Last 3 Weights 11/25/2020 11/25/2020 11/04/2020  Weight (lbs) 132 lb (No Data) 134  lb 6.4 oz  Weight (kg) 59.875 kg (No Data) 60.963 kg     Body mass index is 19.49 kg/m.  General:  Elderly Thin male in no distress HEENT: normal Lymph: no adenopathy Neck: no JVD Endocrine:  Deferred by patient Vascular: radial pulse +2 deferred by patient Cardiac:  Deferred by patient Lungs:  Deferred by patient Abd: soft, nontender, no hepatomegaly  Ext: no edema Musculoskeletal:  No deformities, BUE and BLE strength normal and equal Skin: warm and dry  Psych:  AOX person only (thinks he is in the store does not response to time  Patient agitated and due to his dementia did not present limited exam only  EKG:  11/26/19:  Sinus bradycardia rate 54 no HB LVH in the pattern of apical HCM  Relevant CV Studies:  Transthoracic Echocardiogram: Date: 11/26/2020 Results: Suggestive of Apical HCM with papillary muscle hypertrophy no gradient assessed in mid cavitary, no thombus, TAA mild 4.1 cm 1. There is severe asymmetric hypertrophy of the apical segments up to  1.6 cm. Findings could represent apical variant hypertrophic  cardiomyopathy and would recommend cardiac MRI for better  characterizationm as some of the images may be foreshortened.  There is no apparent LV apical aneurysm or LV thrombus on contrast  imaging. Left ventricular ejection fraction, by estimation, is 55 to 60%.  The left ventricle has normal function. The left ventricle has no regional  wall motion abnormalities. There is   severe asymmetric left ventricular hypertrophy of the apical segment. Left  ventricular diastolic parameters are consistent with Grade I diastolic  dysfunction (impaired relaxation).  2. Right ventricular systolic function is normal. The right ventricular  size is normal. Tricuspid regurgitation signal is inadequate for assessing  PA pressure.  3. Left atrial size was mildly dilated.  4. The mitral valve is degenerative. Trivial mitral valve regurgitation.  No evidence of mitral stenosis.  5. The aortic valve is tricuspid. There is mild thickening of the aortic  valve. Aortic valve regurgitation is not visualized. Mild aortic valve  sclerosis is present, with no evidence of aortic valve stenosis.  6. Aortic dilatation noted. There is mild dilatation of the aortic root  and of the ascending aorta, measuring 41 mm.  7. The inferior vena cava is normal in size with greater than 50%  respiratory variability, suggesting right atrial pressure of 3 mmHg.   Laboratory Data:  High Sensitivity Troponin:  No results for input(s): TROPONINIHS in the last 720 hours.    Chemistry Recent Labs  Lab 11/25/20 1000 11/25/20 1742 11/26/20 0409  NA 139  --  138  K 3.6  --  3.3*  CL 106  --  107  CO2 26  --  23  GLUCOSE 84  --  72  BUN 18  --  15  CREATININE 1.51* 1.27* 1.45*  CALCIUM 8.8*  --  8.7*  GFRNONAA 45* 55* 47*  ANIONGAP 7  --  8    Recent Labs  Lab 11/25/20 1000 11/26/20 0409  PROT 7.2 6.3*  ALBUMIN 3.5 3.0*  AST 16 15  ALT 9 8  ALKPHOS 49 49  BILITOT 1.1 1.8*   Hematology Recent Labs  Lab 11/25/20 1742 11/26/20 0409  WBC 8.2 6.8  RBC 4.18* 3.99*  HGB 12.7* 12.6*  HCT 38.1* 36.0*  MCV 91.1 90.2  MCH 30.4 31.6  MCHC 33.3 35.0  RDW 12.2 12.4  PLT 196 184   BNPNo results for input(s): BNP, PROBNP in the last 168 hours.  DDimer No results for input(s): DDIMER in the last 168 hours.   Radiology/Studies:  MR BRAIN WO CONTRAST  Result Date:  11/26/2020 CLINICAL DATA:  Initial evaluation for neuro deficit, stroke suspected. EXAM: MRI HEAD WITHOUT CONTRAST TECHNIQUE: Multiplanar, multiecho pulse sequences of the brain and surrounding structures were obtained without intravenous contrast. COMPARISON:  Prior CTs from 11/25/2020 FINDINGS: Brain: Generalized age-related cerebral atrophy with mild chronic small vessel ischemic disease. No abnormal foci of restricted diffusion to suggest acute or subacute ischemia. Gray-white matter differentiation maintained. No encephalomalacia to suggest chronic cortical infarction. No evidence for acute or chronic intracranial hemorrhage. No mass lesion, midline shift or mass effect. No hydrocephalus or extra-axial fluid collection. Note made of a partially empty sella. Midline structures intact. Vascular: Major intracranial vascular flow voids are maintained. Skull and upper cervical spine: Craniocervical junction within normal limits. Bone marrow signal intensity normal. No scalp soft tissue abnormality. Sinuses/Orbits: Patient status post bilateral ocular lens replacement. Globes and orbital soft tissues demonstrate no acute finding. Paranasal sinuses are largely clear. No mastoid effusion. Inner ear structures grossly normal. Other: None. IMPRESSION: 1. No acute intracranial abnormality. 2. Generalized age-related cerebral atrophy with mild chronic small vessel ischemic disease. Electronically Signed   By: Jeannine Boga M.D.   On: 11/26/2020 03:41   EEG adult  Result Date: 11/26/2020 Lora Havens, MD     11/26/2020  9:19 AM Patient Name: [name] [name] MRN: [id number] Epilepsy Attending: Lora Havens Referring Physician/Provider: Dr. Marylu Lund Date: 11/25/2020 Duration: 24.14 mins Patient history: 87 year old male with altered mental status.  EEG to evaluate for seizures. Level of alertness: Awake, asleep AEDs during EEG study: None Technical aspects: This EEG study was done with scalp electrodes  positioned according to the 10-20 International system of electrode placement. Electrical activity was acquired at a sampling rate of 500Hz  and reviewed with a high frequency filter of 70Hz  and a low frequency filter of 1Hz . EEG data were recorded continuously and digitally stored. Description: The posterior dominant rhythm consists of 8-9 Hz activity of moderate voltage (25-35 uV) seen predominantly in posterior head regions, symmetric and reactive to eye opening and eye closing. Sleep was characterized by sleep spindles (12 to 14 Hz), maximal frontocentral region.   Hyperventilation and photic stimulation were not performed.   IMPRESSION: This study is within normal limits. No seizures or epileptiform discharges were seen throughout the recording. Lora Havens   ECHOCARDIOGRAM COMPLETE  Result Date: 11/26/2020    ECHOCARDIOGRAM REPORT   Patient Name:   [name] [name] Date of Exam: 11/26/2020 Medical Rec #:  [id number]       Height:       69.0 in Accession #:    [id number]      Weight:       132.0 lb Date of Birth:  15-Dec-1931        BSA:          1.732 m Patient Age:    73 years        BP:           174/89 mmHg Patient Gender: M               HR:           53 bpm. Exam Location:  Inpatient Procedure: 2D Echo, Cardiac Doppler, Color Doppler and Intracardiac            Opacification Agent Indications:    Stroke  History:  Patient has no prior history of Echocardiogram examinations.                 Risk Factors:Hypertension.  Sonographer:    Merrie Roof RDCS Referring Phys: Vivian  1. There is severe asymmetric hypertrophy of the apical segments up to 1.6 cm. Findings could represent apical variant hypertrophic cardiomyopathy and would recommend cardiac MRI for better characterizationm as some of the images may be foreshortened. There is no apparent LV apical aneurysm or LV thrombus on contrast imaging. Left ventricular ejection fraction, by estimation, is 55 to 60%. The left  ventricle has normal function. The left ventricle has no regional wall motion abnormalities. There is severe asymmetric left ventricular hypertrophy of the apical segment. Left ventricular diastolic parameters are consistent with Grade I diastolic dysfunction (impaired relaxation).  2. Right ventricular systolic function is normal. The right ventricular size is normal. Tricuspid regurgitation signal is inadequate for assessing PA pressure.  3. Left atrial size was mildly dilated.  4. The mitral valve is degenerative. Trivial mitral valve regurgitation. No evidence of mitral stenosis.  5. The aortic valve is tricuspid. There is mild thickening of the aortic valve. Aortic valve regurgitation is not visualized. Mild aortic valve sclerosis is present, with no evidence of aortic valve stenosis.  6. Aortic dilatation noted. There is mild dilatation of the aortic root and of the ascending aorta, measuring 41 mm.  7. The inferior vena cava is normal in size with greater than 50% respiratory variability, suggesting right atrial pressure of 3 mmHg. Conclusion(s)/Recommendation(s): No intracardiac source of embolism detected on this transthoracic study. A transesophageal echocardiogram is recommended to exclude cardiac source of embolism if clinically indicated. FINDINGS  Left Ventricle: There is severe asymmetric hypertrophy of the apical segments up to 1.6 cm. Findings could represent apical variant hypertrophic cardiomyopathy and would recommend cardiac MRI for better characterizationm as some of the images may be foreshortened. There is no apparent LV apical aneurysm or LV thrombus on contrast imaging. Left ventricular ejection fraction, by estimation, is 55 to 60%. The left ventricle has normal function. The left ventricle has no regional wall motion abnormalities. Definity contrast agent was given IV to delineate the left ventricular endocardial borders. The left ventricular internal cavity size was normal in size. There  is severe asymmetric left ventricular hypertrophy of the apical segment. Left ventricular diastolic parameters are consistent with Grade I diastolic dysfunction (impaired relaxation). Right Ventricle: The right ventricular size is normal. No increase in right ventricular wall thickness. Right ventricular systolic function is normal. Tricuspid regurgitation signal is inadequate for assessing PA pressure. Left Atrium: Left atrial size was mildly dilated. Right Atrium: Right atrial size was normal in size. Pericardium: Trivial pericardial effusion is present. Mitral Valve: The mitral valve is degenerative in appearance. There is mild calcification of the posterior mitral valve leaflet(s). Mild mitral annular calcification. Trivial mitral valve regurgitation. No evidence of mitral valve stenosis. Tricuspid Valve: The tricuspid valve is grossly normal. Tricuspid valve regurgitation is trivial. No evidence of tricuspid stenosis. Aortic Valve: The aortic valve is tricuspid. There is mild thickening of the aortic valve. Aortic valve regurgitation is not visualized. Mild aortic valve sclerosis is present, with no evidence of aortic valve stenosis. Pulmonic Valve: The pulmonic valve was grossly normal. Pulmonic valve regurgitation is trivial. No evidence of pulmonic stenosis. Aorta: Aortic dilatation noted. There is mild dilatation of the aortic root and of the ascending aorta, measuring 41 mm. Venous: The inferior vena cava is  normal in size with greater than 50% respiratory variability, suggesting right atrial pressure of 3 mmHg. IAS/Shunts: The atrial septum is grossly normal.  LEFT VENTRICLE PLAX 2D LVIDd:         4.30 cm  Diastology LVIDs:         3.30 cm  LV e' medial:    3.15 cm/s LV PW:         0.94 cm  LV E/e' medial:  15.7 LV IVS:        1.45 cm  LV e' lateral:   3.48 cm/s LVOT diam:     2.30 cm  LV E/e' lateral: 14.2 LV SV:         85 LV SV Index:   49 LVOT Area:     4.15 cm  RIGHT VENTRICLE RV Basal diam:  3.00  cm LEFT ATRIUM             Index       RIGHT ATRIUM           Index LA diam:        3.20 cm 1.85 cm/m  RA Area:     11.90 cm LA Vol (A2C):   59.9 ml 34.59 ml/m RA Volume:   21.10 ml  12.19 ml/m LA Vol (A4C):   72.4 ml 41.81 ml/m LA Biplane Vol: 66.4 ml 38.35 ml/m  AORTIC VALVE LVOT Vmax:   116.00 cm/s LVOT Vmean:  63.900 cm/s LVOT VTI:    0.204 m  AORTA Ao Root diam: 4.10 cm Ao Asc diam:  4.10 cm MITRAL VALVE MV Area (PHT): 1.94 cm    SHUNTS MV Decel Time: 391 msec    Systemic VTI:  0.20 m MV E velocity: 49.30 cm/s  Systemic Diam: 2.30 cm MV A velocity: 96.80 cm/s MV E/A ratio:  0.51 Eleonore Chiquito MD Electronically signed by Eleonore Chiquito MD Signature Date/Time: 11/26/2020/11:53:10 AM    Final      Assessment and Plan:   Syncope HTN Hypertrophic Cardiomyopathy Mild Thoracic Aortic Aneursym Dementia - Patient may have had TIA, has not had evidence of heart block, though given bradycardia reasonable for not building antihypertensive regimen around AV nodal agents - Can use hydralazine to supplement the change in medication; though patient may need clonidine in the short term given issues with rebound - have discussed with family the role on screening of first degree relatives for both possible Apical HCM, and Mild TAA (echo screening) and wife will notify his children - Ultimately discussed with wife that patient's dementia may be more than early stages; this is likely a big factor in his long term goals of care.  Time Spent Directly with Patient:   I have spent a total of 80 with the patient reviewing hospital notes, telemetry, EKGs, labs and examining the patient as well as establishing an assessment and plan that was discussed personally with the patient.  > 50% of time was spent in direct patient care, discussing with primary MD, second wife, helping patient stay in room and away from the sharps container, and attempted to re-orient patient with nursing team.   [facility] will sign  off.   Medication Recommendations:  Hydralazine for diltiazem, amlodipine for metoprolol Other recommendations (labs, testing, etc):  NA Follow up as an outpatient:  NA   For questions or updates, please contact [place] Please consult [email or web address] for contact info under     Signed, Werner Lean, MD  11/26/2020 2:40 PM

## 2020-11-26 NOTE — Progress Notes (Signed)
Pt continually pulls his cardiac leads off when staff leaves the room due to dementia.  Reasoning makes no difference given his cognitive state.  Will reapply as staff is available.

## 2020-11-26 NOTE — Progress Notes (Incomplete)
  Echocardiogram 2D Echocardiogram has been performed.  Merrie Roof F 11/26/2020, 11:14 AM

## 2020-11-26 NOTE — Progress Notes (Signed)
SLP Cancellation Note  Patient Details Name: [name] [name] MRN: [id number] DOB: [birth year]   Cancelled treatment:       Reason Eval/Treat Not Completed: Other (comment). Pt passed [facility] swallow assessment and diet initiated. Pt may continue current diet.    Johnnette Laux, Katherene Ponto 11/26/2020, 8:45 AM

## 2020-11-27 ENCOUNTER — Encounter (HOSPITAL_COMMUNITY): Payer: Self-pay | Admitting: Internal Medicine

## 2020-11-27 MED ORDER — HYDRALAZINE HCL 25 MG PO TABS: 25.0000 mg | ORAL_TABLET | Freq: Three times a day (TID) | ORAL | 1 refills | Status: DC

## 2020-11-27 MED ORDER — CLONIDINE HCL 0.1 MG PO TABS: 0.1000 mg | ORAL_TABLET | Freq: Two times a day (BID) | ORAL | 1 refills | Status: DC

## 2020-11-27 MED ORDER — CLONIDINE HCL 0.1 MG PO TABS
0.1000 mg | ORAL_TABLET | Freq: Two times a day (BID) | ORAL | Status: DC
  Administered 2020-11-27: 0.1 mg via ORAL
  Filled 2020-11-27: qty 1

## 2020-11-27 MED ORDER — METOPROLOL SUCCINATE ER 25 MG PO TB24: 12.5000 mg | ORAL_TABLET | Freq: Every day | ORAL | 0 refills | Status: DC

## 2020-11-27 NOTE — TOC Transition Note (Signed)
Transition of Care [facility]) - CM/SW Discharge Note   Patient Details  Name: [name] [name] MRN: [id number] Date of Birth: Jun 07, 1932  [facility] CM/SW Contact:  Pollie Friar, RN Phone Number: 11/27/2020, 11:15 AM   Clinical Narrative:    Patient discharging home with [facility] services through [place]. Cory with [employer] aware of d/c today.  3 in 1 for home to be delivered to the room per [employer]. Pt has supervision at home and transportation to home.   Final next level of care: [place] Barriers to Discharge: No Barriers Identified   Patient Goals and CMS Choice        Discharge Placement                       Discharge Plan and Services                DME Arranged: 3-N-1 DME Agency: [employer] Date DME Agency Contacted: 11/27/20   Representative spoke with at DME Agency: Peggye Form metal [place]: PT,OT [place]: [place] Date [place]: 11/27/20   Representative spoke with at [place]: cory  Social Determinants of Health ([place]) Interventions     Readmission Risk Interventions No flowsheet data found.

## 2020-11-27 NOTE — Discharge Summary (Signed)
Physician Discharge Summary  [name] [name] YBO:[id number] DOB: 16-Feb-1932 DOA: 11/25/2020  PCP: [name], DO  Admit date: 11/25/2020 Discharge date: 11/27/2020  Admitted From: home Disposition:  home  Recommendations for Outpatient Follow-up:  1. Follow up with PCP in 1-2 weeks  Home Health: PT, OT Equipment/Devices: None  Discharge Condition: Stable CODE STATUS: Full code Diet recommendation: Low-sodium diet  HPI: Per admitting MD, [name] [name] is a 87 y.o. male with medical history significant of HTN, HLD, hx of bradycardia, non-obstructive CAD, cardiomyopathy who presents with acute slumping over on the AM of presentation. Pt is currently somewhat confused and pt's wife is not present and currently unable to be reached over phone, thus majority of history is obtained per records. Per documentation, pt was being driven to doctor's office visit on the AM of presentation. En route, pt had "slumped over" with increased confusion and increased slurring of speech. Pt was subsequently brought to ED for further work up.  Hospital Course / Discharge diagnoses: Principal Problem Acute toxic metabolic encephalopathy, underlying dementia-CT of the head as well as MRI negative for acute findings.  EEG done today and he was out without seizures or epileptiform discharges throughout the recording.  His wife intermittently reports confusion at home and occasional hallucinations.  He remains confused here and definitely there is a component of in-hospital delirium.  He is much Calmer this morning, will be discharged home in stable condition  Active Problems Malignant hypertension, concern for syncope and possible symptomatic bradycardia-on admission he was normotensive however he was bradycardic with a heart rate as low as in the 30s.  His home regimen of diltiazem, clonidine, metoprolol were held.  His blood pressure rises again into the 277O systolic.  Cardiology was consulted to  assist with management and evaluated patient while hospitalized.  Currently was placed on a regimen with clonidine, very low-dose metoprolol to prevent rebound tachycardia as well as hydralazine 25 mg every 8.  Hydralazine can definitely be increased in an outpatient setting.  His bradycardia resolved, heart rate is in the 80s, his blood pressure improved and will be discharged home in stable condition with outpatient follow-up.  Of note, patient has had several high readings of blood pressure in the hospital also in the setting of underlying dementia and agitation/in-hospital delirium from being here.  I have discontinued losartan given concern for hypertrophic cardiomyopathy on 2D echo Concern for HOCM-seen on 2D echo, evaluated by cardiology and they will continue to follow as an outpatient.  Would benefit from low-dose metoprolol on discharge as above Nonocclusive CAD-no chest pain or other symptoms Hyperlipidemia-on statin  Sepsis ruled out   Discharge Instructions   Allergies as of 11/27/2020      Reactions   Latanoprost Other (See Comments)   Eye irritation      Medication List    STOP taking these medications   diltiazem 420 MG 24 hr tablet Commonly known as: Matzim LA   losartan 50 MG tablet Commonly known as: COZAAR   mirtazapine 15 MG tablet Commonly known as: Remeron     TAKE these medications   atorvastatin 40 MG tablet Commonly known as: LIPITOR Take 1 tablet (40 mg total) by mouth daily.   brimonidine 0.2 % ophthalmic solution Commonly known as: ALPHAGAN Place 1 drop into both eyes in the morning and at bedtime.   cloNIDine 0.1 MG tablet Commonly known as: CATAPRES Take 1 tablet (0.1 mg total) by mouth 2 (two) times daily.  dorzolamide-timolol 22.3-6.8 MG/ML ophthalmic solution Commonly known as: COSOPT Place 1 drop into both eyes 2 (two) times daily.   hydrALAZINE 25 MG tablet Commonly known as: APRESOLINE Take 1 tablet (25 mg total) by mouth every 8  (eight) hours.   metoprolol succinate 25 MG 24 hr tablet Commonly known as: TOPROL-XL Take 0.5 tablets (12.5 mg total) by mouth daily. What changed:   how much to take  Another medication with the same name was removed. Continue taking this medication, and follow the directions you see here.       Follow-up Information    [name], DO. Schedule an appointment as soon as possible for a visit in 1 week(s).   Specialty: Family Medicine Contact information: [address] [place] [address] [phone]               Consultations:  Cardiology   Neurology  Procedures/Studies:  MR BRAIN WO CONTRAST  Result Date: 11/26/2020 CLINICAL DATA:  Initial evaluation for neuro deficit, stroke suspected. EXAM: MRI HEAD WITHOUT CONTRAST TECHNIQUE: Multiplanar, multiecho pulse sequences of the brain and surrounding structures were obtained without intravenous contrast. COMPARISON:  Prior CTs from 11/25/2020 FINDINGS: Brain: Generalized age-related cerebral atrophy with mild chronic small vessel ischemic disease. No abnormal foci of restricted diffusion to suggest acute or subacute ischemia. Gray-white matter differentiation maintained. No encephalomalacia to suggest chronic cortical infarction. No evidence for acute or chronic intracranial hemorrhage. No mass lesion, midline shift or mass effect. No hydrocephalus or extra-axial fluid collection. Note made of a partially empty sella. Midline structures intact. Vascular: Major intracranial vascular flow voids are maintained. Skull and upper cervical spine: Craniocervical junction within normal limits. Bone marrow signal intensity normal. No scalp soft tissue abnormality. Sinuses/Orbits: Patient status post bilateral ocular lens replacement. Globes and orbital soft tissues demonstrate no acute finding. Paranasal sinuses are largely clear. No mastoid effusion. Inner ear structures grossly normal. Other: None.  IMPRESSION: 1. No acute intracranial abnormality. 2. Generalized age-related cerebral atrophy with mild chronic small vessel ischemic disease. Electronically Signed   By: Jeannine Boga M.D.   On: 11/26/2020 03:41   EEG adult  Result Date: 11/26/2020 Lora Havens, MD     11/26/2020  9:19 AM Patient Name: [name] [name] MRN: [id number] Epilepsy Attending: Lora Havens Referring Physician/Provider: Dr. Marylu Lund Date: 11/25/2020 Duration: 24.14 mins Patient history: 87 year old male with altered mental status.  EEG to evaluate for seizures. Level of alertness: Awake, asleep AEDs during EEG study: None Technical aspects: This EEG study was done with scalp electrodes positioned according to the 10-20 International system of electrode placement. Electrical activity was acquired at a sampling rate of 500Hz  and reviewed with a high frequency filter of 70Hz  and a low frequency filter of 1Hz . EEG data were recorded continuously and digitally stored. Description: The posterior dominant rhythm consists of 8-9 Hz activity of moderate voltage (25-35 uV) seen predominantly in posterior head regions, symmetric and reactive to eye opening and eye closing. Sleep was characterized by sleep spindles (12 to 14 Hz), maximal frontocentral region.   Hyperventilation and photic stimulation were not performed.   IMPRESSION: This study is within normal limits. No seizures or epileptiform discharges were seen throughout the recording. Lora Havens   ECHOCARDIOGRAM COMPLETE  Result Date: 11/26/2020    ECHOCARDIOGRAM REPORT   Patient Name:   [name] [name] Date of Exam: 11/26/2020 Medical Rec #:  [id number]       Height:  69.0 in Accession #:    [id number]      Weight:       132.0 lb Date of Birth:  February 21, 1932        BSA:          1.732 m Patient Age:    64 years        BP:           174/89 mmHg Patient Gender: M               HR:           53 bpm. Exam Location:  Inpatient Procedure: 2D Echo, Cardiac Doppler,  Color Doppler and Intracardiac            Opacification Agent Indications:    Stroke  History:        Patient has no prior history of Echocardiogram examinations.                 Risk Factors:Hypertension.  Sonographer:    Merrie Roof RDCS Referring Phys: [place]  1. There is severe asymmetric hypertrophy of the apical segments up to 1.6 cm. Findings could represent apical variant hypertrophic cardiomyopathy and would recommend cardiac MRI for better characterizationm as some of the images may be foreshortened. There is no apparent LV apical aneurysm or LV thrombus on contrast imaging. Left ventricular ejection fraction, by estimation, is 55 to 60%. The left ventricle has normal function. The left ventricle has no regional wall motion abnormalities. There is severe asymmetric left ventricular hypertrophy of the apical segment. Left ventricular diastolic parameters are consistent with Grade I diastolic dysfunction (impaired relaxation).  2. Right ventricular systolic function is normal. The right ventricular size is normal. Tricuspid regurgitation signal is inadequate for assessing PA pressure.  3. Left atrial size was mildly dilated.  4. The mitral valve is degenerative. Trivial mitral valve regurgitation. No evidence of mitral stenosis.  5. The aortic valve is tricuspid. There is mild thickening of the aortic valve. Aortic valve regurgitation is not visualized. Mild aortic valve sclerosis is present, with no evidence of aortic valve stenosis.  6. Aortic dilatation noted. There is mild dilatation of the aortic root and of the ascending aorta, measuring 41 mm.  7. The inferior vena cava is normal in size with greater than 50% respiratory variability, suggesting right atrial pressure of 3 mmHg. Conclusion(s)/Recommendation(s): No intracardiac source of embolism detected on this transthoracic study. A transesophageal echocardiogram is recommended to exclude cardiac source of embolism if  clinically indicated. FINDINGS  Left Ventricle: There is severe asymmetric hypertrophy of the apical segments up to 1.6 cm. Findings could represent apical variant hypertrophic cardiomyopathy and would recommend cardiac MRI for better characterizationm as some of the images may be foreshortened. There is no apparent LV apical aneurysm or LV thrombus on contrast imaging. Left ventricular ejection fraction, by estimation, is 55 to 60%. The left ventricle has normal function. The left ventricle has no regional wall motion abnormalities. Definity contrast agent was given IV to delineate the left ventricular endocardial borders. The left ventricular internal cavity size was normal in size. There is severe asymmetric left ventricular hypertrophy of the apical segment. Left ventricular diastolic parameters are consistent with Grade I diastolic dysfunction (impaired relaxation). Right Ventricle: The right ventricular size is normal. No increase in right ventricular wall thickness. Right ventricular systolic function is normal. Tricuspid regurgitation signal is inadequate for assessing PA pressure. Left Atrium: Left atrial size was mildly dilated. Right  Atrium: Right atrial size was normal in size. Pericardium: Trivial pericardial effusion is present. Mitral Valve: The mitral valve is degenerative in appearance. There is mild calcification of the posterior mitral valve leaflet(s). Mild mitral annular calcification. Trivial mitral valve regurgitation. No evidence of mitral valve stenosis. Tricuspid Valve: The tricuspid valve is grossly normal. Tricuspid valve regurgitation is trivial. No evidence of tricuspid stenosis. Aortic Valve: The aortic valve is tricuspid. There is mild thickening of the aortic valve. Aortic valve regurgitation is not visualized. Mild aortic valve sclerosis is present, with no evidence of aortic valve stenosis. Pulmonic Valve: The pulmonic valve was grossly normal. Pulmonic valve regurgitation is  trivial. No evidence of pulmonic stenosis. Aorta: Aortic dilatation noted. There is mild dilatation of the aortic root and of the ascending aorta, measuring 41 mm. Venous: The inferior vena cava is normal in size with greater than 50% respiratory variability, suggesting right atrial pressure of 3 mmHg. IAS/Shunts: The atrial septum is grossly normal.  LEFT VENTRICLE PLAX 2D LVIDd:         4.30 cm  Diastology LVIDs:         3.30 cm  LV e' medial:    3.15 cm/s LV PW:         0.94 cm  LV E/e' medial:  15.7 LV IVS:        1.45 cm  LV e' lateral:   3.48 cm/s LVOT diam:     2.30 cm  LV E/e' lateral: 14.2 LV SV:         85 LV SV Index:   49 LVOT Area:     4.15 cm  RIGHT VENTRICLE RV Basal diam:  3.00 cm LEFT ATRIUM             Index       RIGHT ATRIUM           Index LA diam:        3.20 cm 1.85 cm/m  RA Area:     11.90 cm LA Vol (A2C):   59.9 ml 34.59 ml/m RA Volume:   21.10 ml  12.19 ml/m LA Vol (A4C):   72.4 ml 41.81 ml/m LA Biplane Vol: 66.4 ml 38.35 ml/m  AORTIC VALVE LVOT Vmax:   116.00 cm/s LVOT Vmean:  63.900 cm/s LVOT VTI:    0.204 m  AORTA Ao Root diam: 4.10 cm Ao Asc diam:  4.10 cm MITRAL VALVE MV Area (PHT): 1.94 cm    SHUNTS MV Decel Time: 391 msec    Systemic VTI:  0.20 m MV E velocity: 49.30 cm/s  Systemic Diam: 2.30 cm MV A velocity: 96.80 cm/s MV E/A ratio:  0.51 Eleonore Chiquito MD Electronically signed by Eleonore Chiquito MD Signature Date/Time: 11/26/2020/11:53:10 AM    Final    CT HEAD CODE STROKE WO CONTRAST  Result Date: 11/25/2020 CLINICAL DATA:  Code stroke. EXAM: CT HEAD WITHOUT CONTRAST TECHNIQUE: Contiguous axial images were obtained from the base of the skull through the vertex without intravenous contrast. COMPARISON:  None. FINDINGS: Brain: No acute intracranial hemorrhage, mass effect, or edema. Gray-white differentiation is preserved. Minimal patchy hypoattenuation in the supratentorial white matter is nonspecific but may reflect minor chronic microvascular ischemic changes. Chronic  left thalamic insult reported on prior MRI is not present. Prominence of the ventricles and sulci reflects mild generalized parenchymal volume loss. Vascular: No hyperdense vessel. There is intracranial atherosclerotic calcification at the skull base. Skull: Unremarkable. Sinuses/Orbits: No acute abnormality. Other: None. ASPECTS [facility] Stroke Program Early  CT Score) - Ganglionic level infarction (caudate, lentiform nuclei, internal capsule, insula, M1-M3 cortex): 7 - Supraganglionic infarction (M4-M6 cortex): 3 Total score (0-10 with 10 being normal): 10 IMPRESSION: There is no acute intracranial hemorrhage or evidence of acute infarction. ASPECT score is 10. These results were called by telephone at the time of interpretation on 11/25/2020 at 10:15 am to provider Sherwood Gambler , who verbally acknowledged these results. Electronically Signed   By: Macy Mis M.D.   On: 11/25/2020 10:20   CT ANGIO HEAD CODE STROKE  Result Date: 11/25/2020 CLINICAL DATA:  Neuro deficit, acute, stroke suspected. Additional provided: Weakness, slumped over in car while headed to doctor's appointment, low blood sugar, stroke suspected. EXAM: CT ANGIOGRAPHY HEAD AND NECK TECHNIQUE: Multidetector CT imaging of the head and neck was performed using the standard protocol during bolus administration of intravenous contrast. Multiplanar CT image reconstructions and MIPs were obtained to evaluate the vascular anatomy. Carotid stenosis measurements (when applicable) are obtained utilizing NASCET criteria, using the distal internal carotid diameter as the denominator. CONTRAST:  9mL OMNIPAQUE IOHEXOL 350 MG/ML SOLN COMPARISON:  Noncontrast head CT performed earlier today 11/25/2020. FINDINGS: CTA NECK FINDINGS Aortic arch: Common origin of the innominate and left common carotid arteries. Atherosclerotic plaque within the visualized aortic arch and proximal major branch vessels of the neck. No hemodynamically significant innominate or  proximal subclavian artery stenosis. Right carotid system: CCA and ICA patent within the neck. Soft and calcified plaque within the carotid bifurcation and proximal ICA with less than 50% stenosis. Left carotid system: CCA and ICA patent within the neck. Prominent calcified plaque within the proximal ICA with estimated 70% stenosis (series 7, image 154). Vertebral arteries: Codominant and patent within the neck without stenosis. Nonstenotic calcified plaque at the origin of the left vertebral artery. Skeleton: Cervical spondylosis. No acute bony abnormality or aggressive osseous lesion. Other neck: No neck mass or cervical lymphadenopathy. Subcentimeter right thyroid lobe nodule not meeting consensus criteria for ultrasound follow-up. Upper chest: Ill-defined and linear opacities within the left upper lobe with an appearance most suggestive of scarring on the coronal reconstructions. Review of the MIP images confirms the above findings CTA HEAD FINDING: Anterior circulation: The intracranial internal carotid arteries are patent. Calcified plaque with vessels with no more than mild stenosis. The M1 middle cerebral arteries are patent. No M2 proxi mal branch occlusion or high-grade proximal stenosis is identified. No intracranial aneurysm is identified. Posterior circulation: The intracranial vertebral arteries are patent. The basilar artery is patent. The posterior cerebral arteries are patent. High-grade focal stenosis within the right PCA at the P2/P3 junction (series 10, image 52). Posterior communicating arteries are hypoplastic or absent bilaterally. Venous sinuses: Within the limitations of contrast timing, no convincing thrombus. Anatomic variants: As described Review of the MIP images confirms the above findings These results were called by telephone at the time of interpretation on 11/25/2020 at 12:25 pm to provider Sherwood Gambler , who verbally acknowledged these results. IMPRESSION: CTA neck: 1. Calcified  plaque within the proximal left ICA with estimated 70%. However, this apparent stenosis could be exaggerated by blooming artifact from calcified plaque. Consider a carotid artery duplex for further quantification of stenosis at this site. 2. Atherosclerotic plaque within the right carotid bifurcation and proximal ICA with less than 50% stenosis. 3. Vertebral arteries patent within the neck without stenosis. CTA neck: 1. No intracranial large vessel occlusion. 2. High-grade focal stenosis within the right posterior cerebral artery at the P2/P3 junction. 3. Atherosclerotic  plaque within the intracranial ICAs bilaterally with no more than mild stenosis. Electronically Signed   By: Kellie Simmering DO   On: 11/25/2020 12:25   CT ANGIO NECK CODE STROKE  Result Date: 11/25/2020 CLINICAL DATA:  Neuro deficit, acute, stroke suspected. Additional provided: Weakness, slumped over in car while headed to doctor's appointment, low blood sugar, stroke suspected. EXAM: CT ANGIOGRAPHY HEAD AND NECK TECHNIQUE: Multidetector CT imaging of the head and neck was performed using the standard protocol during bolus administration of intravenous contrast. Multiplanar CT image reconstructions and MIPs were obtained to evaluate the vascular anatomy. Carotid stenosis measurements (when applicable) are obtained utilizing NASCET criteria, using the distal internal carotid diameter as the denominator. CONTRAST:  30mL OMNIPAQUE IOHEXOL 350 MG/ML SOLN COMPARISON:  Noncontrast head CT performed earlier today 11/25/2020. FINDINGS: CTA NECK FINDINGS Aortic arch: Common origin of the innominate and left common carotid arteries. Atherosclerotic plaque within the visualized aortic arch and proximal major branch vessels of the neck. No hemodynamically significant innominate or proximal subclavian artery stenosis. Right carotid system: CCA and ICA patent within the neck. Soft and calcified plaque within the carotid bifurcation and proximal ICA with less  than 50% stenosis. Left carotid system: CCA and ICA patent within the neck. Prominent calcified plaque within the proximal ICA with estimated 70% stenosis (series 7, image 154). Vertebral arteries: Codominant and patent within the neck without stenosis. Nonstenotic calcified plaque at the origin of the left vertebral artery. Skeleton: Cervical spondylosis. No acute bony abnormality or aggressive osseous lesion. Other neck: No neck mass or cervical lymphadenopathy. Subcentimeter right thyroid lobe nodule not meeting consensus criteria for ultrasound follow-up. Upper chest: Ill-defined and linear opacities within the left upper lobe with an appearance most suggestive of scarring on the coronal reconstructions. Review of the MIP images confirms the above findings CTA HEAD FINDING: Anterior circulation: The intracranial internal carotid arteries are patent. Calcified plaque with vessels with no more than mild stenosis. The M1 middle cerebral arteries are patent. No M2 proxi mal branch occlusion or high-grade proximal stenosis is identified. No intracranial aneurysm is identified. Posterior circulation: The intracranial vertebral arteries are patent. The basilar artery is patent. The posterior cerebral arteries are patent. High-grade focal stenosis within the right PCA at the P2/P3 junction (series 10, image 52). Posterior communicating arteries are hypoplastic or absent bilaterally. Venous sinuses: Within the limitations of contrast timing, no convincing thrombus. Anatomic variants: As described Review of the MIP images confirms the above findings These results were called by telephone at the time of interpretation on 11/25/2020 at 12:25 pm to provider Sherwood Gambler , who verbally acknowledged these results. IMPRESSION: CTA neck: 1. Calcified plaque within the proximal left ICA with estimated 70%. However, this apparent stenosis could be exaggerated by blooming artifact from calcified plaque. Consider a carotid artery  duplex for further quantification of stenosis at this site. 2. Atherosclerotic plaque within the right carotid bifurcation and proximal ICA with less than 50% stenosis. 3. Vertebral arteries patent within the neck without stenosis. CTA neck: 1. No intracranial large vessel occlusion. 2. High-grade focal stenosis within the right posterior cerebral artery at the P2/P3 junction. 3. Atherosclerotic plaque within the intracranial ICAs bilaterally with no more than mild stenosis. Electronically Signed   By: Kellie Simmering DO   On: 11/25/2020 12:25      Subjective: - no chest pain, shortness of breath, no abdominal pain, nausea or vomiting.   Discharge Exam: BP (!) 162/92   Pulse 85   Temp  99.1 F (37.3 C) (Oral)   Resp 20   Ht 5\' 9"  (1.753 m)   Wt 59.9 kg   SpO2 100%   BMI 19.49 kg/m   General: Pt is alert, awake, not in acute distress Cardiovascular: RRR, S1/S2 +, no rubs, no gallops Respiratory: CTA bilaterally, no wheezing, no rhonchi Abdominal: Soft, NT, ND, bowel sounds + Extremities: no edema, no cyanosis    The results of significant diagnostics from this hospitalization (including imaging, microbiology, ancillary and laboratory) are listed below for reference.     Microbiology: Recent Results (from the past 240 hour(s))  Resp Panel by RT-PCR (Flu A&B, Covid) Nasopharyngeal Swab     Status: None   Collection Time: 11/25/20 10:34 AM   Specimen: Nasopharyngeal Swab; Nasopharyngeal(NP) swabs in vial transport medium  Result Value Ref Range Status   SARS Coronavirus 2 by RT PCR NEGATIVE NEGATIVE Final    Comment: (NOTE) SARS-CoV-2 target nucleic acids are NOT DETECTED.  The SARS-CoV-2 RNA is generally detectable in upper respiratory specimens during the acute phase of infection. The lowest concentration of SARS-CoV-2 viral copies this assay can detect is 138 copies/mL. A negative result does not preclude SARS-Cov-2 infection and should not be used as the sole basis for  treatment or other patient management decisions. A negative result may occur with  improper specimen collection/handling, submission of specimen other than nasopharyngeal swab, presence of viral mutation(s) within the areas targeted by this assay, and inadequate number of viral copies(<138 copies/mL). A negative result must be combined with clinical observations, patient history, and epidemiological information. The expected result is Negative.  Fact Sheet for Patients:  EntrepreneurPulse.com.au  Fact Sheet for Healthcare Providers:  IncredibleEmployment.be  This test is no t yet approved or cleared by the [place] FDA and  has been authorized for detection and/or diagnosis of SARS-CoV-2 by FDA under an Emergency Use Authorization (EUA). This EUA will remain  in effect (meaning this test can be used) for the duration of the COVID-19 declaration under Section 564(b)(1) of the Act, 21 U.S.C.section 360bbb-3(b)(1), unless the authorization is terminated  or revoked sooner.       Influenza A by PCR NEGATIVE NEGATIVE Final   Influenza B by PCR NEGATIVE NEGATIVE Final    Comment: (NOTE) The Xpert Xpress SARS-CoV-2/FLU/RSV plus assay is intended as an aid in the diagnosis of influenza from Nasopharyngeal swab specimens and should not be used as a sole basis for treatment. Nasal washings and aspirates are unacceptable for Xpert Xpress SARS-CoV-2/FLU/RSV testing.  Fact Sheet for Patients: EntrepreneurPulse.com.au  Fact Sheet for Healthcare Providers: IncredibleEmployment.be  This test is not yet approved or cleared by the [place] FDA and has been authorized for detection and/or diagnosis of SARS-CoV-2 by FDA under an Emergency Use Authorization (EUA). This EUA will remain in effect (meaning this test can be used) for the duration of the COVID-19 declaration under Section 564(b)(1) of the Act, 21  U.S.C. section 360bbb-3(b)(1), unless the authorization is terminated or revoked.  Performed at [facility], [address] [place], [place] [address]      Labs: Basic Metabolic Panel: Recent Labs  Lab 11/25/20 1000 11/25/20 1742 11/26/20 0409  NA 139  --  138  K 3.6  --  3.3*  CL 106  --  107  CO2 26  --  23  GLUCOSE 84  --  72  BUN 18  --  15  CREATININE 1.51* 1.27* 1.45*  CALCIUM 8.8*  --  8.7*   Liver Function Tests: Recent Labs  Lab 11/25/20 1000 11/26/20 0409  AST 16 15  ALT 9 8  ALKPHOS 49 49  BILITOT 1.1 1.8*  PROT 7.2 6.3*  ALBUMIN 3.5 3.0*   CBC: Recent Labs  Lab 11/25/20 1000 11/25/20 1742 11/26/20 0409  WBC 6.9 8.2 6.8  NEUTROABS 5.0  --   --   HGB 12.8* 12.7* 12.6*  HCT 38.7* 38.1* 36.0*  MCV 92.6 91.1 90.2  PLT 216 196 184   CBG: Recent Labs  Lab 11/25/20 0957 11/25/20 1023 11/25/20 2142  GLUCAP 65* 154* 79   Hgb A1c Recent Labs    11/26/20 0409  HGBA1C 4.8   Lipid Profile Recent Labs    11/26/20 0409  CHOL 158  HDL 41  LDLCALC 101*  TRIG 79  CHOLHDL 3.9   Thyroid function studies No results for input(s): TSH, T4TOTAL, T3FREE, THYROIDAB in the last 72 hours.  Invalid input(s): FREET3 Urinalysis    Component Value Date/Time   COLORURINE YELLOW 11/25/2020 1024   APPEARANCEUR CLEAR 11/25/2020 1024   LABSPEC 1.010 11/25/2020 1024   PHURINE 7.0 11/25/2020 1024   GLUCOSEU >=500 (A) 11/25/2020 1024   HGBUR NEGATIVE 11/25/2020 1024   HGBUR negative 05/19/2010 1106   BILIRUBINUR NEGATIVE 11/25/2020 1024   BILIRUBINUR trace 10/06/2015 1030   KETONESUR NEGATIVE 11/25/2020 1024   PROTEINUR NEGATIVE 11/25/2020 1024   UROBILINOGEN negative 10/06/2015 1030   UROBILINOGEN 0.2 05/19/2010 1106   NITRITE NEGATIVE 11/25/2020 1024   LEUKOCYTESUR NEGATIVE 11/25/2020 1024    FURTHER DISCHARGE INSTRUCTIONS:   Get Medicines reviewed and adjusted: Please take all your medications with you for your next visit with  your Primary MD   Laboratory/radiological data: Please request your Primary MD to go over all hospital tests and procedure/radiological results at the follow up, please ask your Primary MD to get all Hospital records sent to his/her office.   In some cases, they will be blood work, cultures and biopsy results pending at the time of your discharge. Please request that your primary care M.D. goes through all the records of your hospital data and follows up on these results.   Also Note the following: If you experience worsening of your admission symptoms, develop shortness of breath, life threatening emergency, suicidal or homicidal thoughts you must seek medical attention immediately by calling 911 or calling your MD immediately  if symptoms less severe.   You must read complete instructions/literature along with all the possible adverse reactions/side effects for all the Medicines you take and that have been prescribed to you. Take any new Medicines after you have completely understood and accpet all the possible adverse reactions/side effects.    Do not drive when taking Pain medications or sleeping medications (Benzodaizepines)   Do not take more than prescribed Pain, Sleep and Anxiety Medications. It is not advisable to combine anxiety,sleep and pain medications without talking with your primary care practitioner   Special Instructions: If you have smoked or chewed Tobacco  in the last 2 yrs please stop smoking, stop any regular Alcohol  and or any Recreational drug use.   Wear Seat belts while driving.   Please note: You were cared for by a hospitalist during your hospital stay. Once you are discharged, your primary care physician will handle any further medical issues. Please note that NO REFILLS for any discharge medications will be authorized once you are discharged, as it is imperative that you return to your primary care physician (or  establish a relationship with a primary care  physician if you do not have one) for your post hospital discharge needs so that they can reassess your need for medications and monitor your lab values.  Time coordinating discharge: 40 minutes  SIGNED:  Marzetta Board, MD, PhD 11/27/2020, 9:56 AM

## 2020-11-27 NOTE — Plan of Care (Signed)
  Problem: Education: Goal: Knowledge of General Education information will improve Description: Including pain rating scale, medication(s)/side effects and non-pharmacologic comfort measures Outcome: Adequate for Discharge   Problem: Health Behavior/Discharge Planning: Goal: Ability to manage health-related needs will improve Outcome: Adequate for Discharge   Problem: Clinical Measurements: Goal: Ability to maintain clinical measurements within normal limits will improve Outcome: Adequate for Discharge Goal: Will remain free from infection Outcome: Adequate for Discharge Goal: Diagnostic test results will improve Outcome: Adequate for Discharge Goal: Respiratory complications will improve Outcome: Adequate for Discharge Goal: Cardiovascular complication will be avoided Outcome: Adequate for Discharge   Problem: Activity: Goal: Risk for activity intolerance will decrease Outcome: Adequate for Discharge   Problem: Nutrition: Goal: Adequate nutrition will be maintained Outcome: Adequate for Discharge   Problem: Coping: Goal: Level of anxiety will decrease Outcome: Adequate for Discharge   Problem: Elimination: Goal: Will not experience complications related to bowel motility Outcome: Adequate for Discharge Goal: Will not experience complications related to urinary retention Outcome: Adequate for Discharge   Problem: Pain Managment: Goal: General experience of comfort will improve Outcome: Adequate for Discharge   Problem: Safety: Goal: Ability to remain free from injury will improve Outcome: Adequate for Discharge   Problem: Skin Integrity: Goal: Risk for impaired skin integrity will decrease Outcome: Adequate for Discharge   Problem: Safety: Goal: Non-violent Restraint(s) Outcome: Adequate for Discharge   Problem: Safety: Goal: Non-violent Restraint(s) Outcome: Adequate for Discharge

## 2020-11-27 NOTE — Progress Notes (Signed)
Physical Therapy Treatment Patient Details Name: [name] [name] MRN: [id number] DOB: Feb 10, 1932 Today's Date: 11/16/2020    History of Present Illness Pt is an 87 y/o male admitted after acute episode of slumping over in AM, confusion, and slurring of speech. Found to be hypertensive; CT and MRI negative; EEG unremarkable. PMH significant for HTN, bradycardia, non-obstructive CAD, cardiomyopathy, probable Alzheimer's.    PT Comments    Pt with increased agitation during session requiring +2 for safety of pt and staff. Pt requesting to use the restroom with therapist assisting pt, pt unable to follow cueing regarding location of restroom and once on toilet continued to be agitated and stand up. Pt requiring +2 due to agitation and resistance. Based on previous session (evaluation) and pt requiring min A and wife stating pt is at baseline functionally just with increased confusion, PT recommend d/c home with HHPT and 24 hour S/A to improve orientation and ability to safely participate in functional mobility tasks. Pt will continue to benefit from skilled PT to address deficits in balance, gait, endurance and  Safety     Follow Up Recommendations  Home health PT;Supervision/Assistance - 24 hour     Equipment Recommendations  None recommended by PT    Recommendations for Other Services       Precautions / Restrictions Precautions Precautions: Fall Precaution Comments: Low vision, restraints Restrictions Weight Bearing Restrictions: No    Mobility  Bed Mobility Overal bed mobility: Modified Independent                  Transfers Overall transfer level: Needs assistance   Transfers: Sit to/from Stand Sit to Stand: +2 physical assistance         General transfer comment: pt with increased agitation requiring +2 for safety; pt with increased confusion and unable to follow cueing regarding transfer to bathroom like he requested  Ambulation/Gait Ambulation/Gait assistance:  +2 physical assistance;+2 safety/equipment   Assistive device: 2 person hand held assist       General Gait Details: pt very agitated wtih increased posterior lean and resistant to assist to avoid obstacles   Stairs             Wheelchair Mobility    Modified Rankin (Stroke Patients Only)       Balance                                            Cognition Arousal/Alertness: Awake/alert Behavior During Therapy: Agitated Overall Cognitive Status: History of cognitive impairments - at baseline Area of Impairment: Orientation;Memory;Following commands;Awareness;Problem solving;Safety/judgement                 Orientation Level: Disoriented to;Situation;Place   Memory: Decreased short-term memory Following Commands: Follows one step commands inconsistently Safety/Judgement: Decreased awareness of safety;Decreased awareness of deficits     General Comments: pt disoriented during session wtih increased agitation. Pt became very upset when attempting to assist pt to bathroom, pt became agitated attempting to hit      Exercises      General Comments        Pertinent Vitals/Pain      Home Living                      Prior Function            PT Goals (current goals can now  be found in the care plan section) Acute Rehab PT Goals Patient Stated Goal: none stated PT Goal Formulation: With patient Time For Goal Achievement: 12/03/20 Potential to Achieve Goals: Good Progress towards PT goals: Not progressing toward goals - comment (pt wtih increased agitation during session limiting participation and requiring increased assist for functional mobility)    Frequency    Min 3X/week      PT Plan Current plan remains appropriate;Other (comment) (pt with increased confusion and resistance to assistance durig session. Per participation in yesterday's session and wife stating he does better at home, continue to recommend d/c home  to improve orientation and safety)    Co-evaluation              AM-PAC PT "6 Clicks" Mobility   Outcome Measure  Help needed turning from your back to your side while in a flat bed without using bedrails?: None Help needed moving from lying on your back to sitting on the side of a flat bed without using bedrails?: None Help needed moving to and from a bed to a chair (including a wheelchair)?: A Lot Help needed standing up from a chair using your arms (e.g., wheelchair or bedside chair)?: A Lot Help needed to walk in hospital room?: A Lot Help needed climbing 3-5 steps with a railing? : A Lot 6 Click Score: 16    End of Session Equipment Utilized During Treatment: Gait belt Activity Tolerance: Treatment limited secondary to agitation Patient left: in bed;with nursing/sitter in room;with restraints reapplied;with bed alarm set Nurse Communication: Mobility status PT Visit Diagnosis: Unsteadiness on feet (R26.81);Other abnormalities of gait and mobility (R26.89)     Time: 1610-9604 PT Time Calculation (min) (ACUTE ONLY): 23 min  Charges:  $Therapeutic Activity: 23-37 mins                     Lyanne Co, DPT Acute Rehabilitation Services [phone]   [name] [name] 11/30/2020, 10:38 AM

## 2020-11-27 NOTE — Progress Notes (Signed)
Patient's wife, German Manke, called talked  about her husband's current state. She discussed that some confusion for him at home is baseline. She also explained that Mr. Altland was seen by a doctor last year concerning his confusion. She did explain that she attempted to remind the patient about his safety precautions and not getting out of bed without help during the day.   She expressed her desire to possibly discharge him within the next day or so. She stated "Home might be better for him. The hospital seems to be making him more confused."  She also hopes that her husband's care status shows up as "in-patient" for insurance reasons.

## 2020-11-27 NOTE — Progress Notes (Signed)
Patient is confused, in restraints for safety, IV removed per order, discharge instructions given to wife to share with home health staff. Reviewed with wife of patient to be transported by family car.

## 2020-11-27 NOTE — Progress Notes (Signed)
Patient Edgerrin Correia has bilateral wrist restraints but was found to have redness and an abrasion on the R. lateral Knee on 11/26/20 @ 2243  from moving legs to the end of the bed toward the siderail. He has been attempting to get up with intervals of rest and restlessness. PRN of Haldol was given prior to.  Prophylactic treatment to prevent injury: Bilateral wrists with gauze wrap and foam applied at 11/26/2020 @ 2304. L. Knee with gauze wrap and foam applied at 11/26/2020 @ 2349.  Treatment for abrasion: A dressing with xeroform, gauze patch, gauze wrap has been applied at 11/26/2020 @ 2304.    11/26/20 2304  Integumentary  Integumentary (WDL) X  Skin Color Appropriate for ethnicity  Skin Condition Dry  Skin Integrity Intact;Abrasion  Abrasion Location Knee  Abrasion Location Orientation Right;Lateral;Mid  Abrasion Intervention Foam;Gauze;Other (Comment) (Assessed)  Skin Turgor Non-tenting    Patient is generally pleasant but is confused and can become irritable/agitated due to desire of wanting restraints off to "go home". Will continue to monitor.   Night MD has been notified.

## 2020-12-08 ENCOUNTER — Encounter: Payer: Self-pay | Admitting: Family Medicine

## 2020-12-08 ENCOUNTER — Ambulatory Visit: Payer: Federal, State, Local not specified - PPO | Admitting: Family Medicine

## 2020-12-08 ENCOUNTER — Other Ambulatory Visit: Payer: Self-pay

## 2020-12-08 VITALS — BP 100/70 | HR 45 | Temp 97.5°F | Resp 18 | Ht 69.0 in | Wt 132.2 lb

## 2020-12-08 DIAGNOSIS — G3184 Mild cognitive impairment, so stated: Secondary | ICD-10-CM

## 2020-12-08 DIAGNOSIS — I6381 Other cerebral infarction due to occlusion or stenosis of small artery: Secondary | ICD-10-CM

## 2020-12-08 DIAGNOSIS — G309 Alzheimer's disease, unspecified: Secondary | ICD-10-CM | POA: Diagnosis not present

## 2020-12-08 DIAGNOSIS — F0280 Dementia in other diseases classified elsewhere without behavioral disturbance: Secondary | ICD-10-CM | POA: Diagnosis not present

## 2020-12-08 DIAGNOSIS — I10 Essential (primary) hypertension: Secondary | ICD-10-CM | POA: Diagnosis not present

## 2020-12-08 DIAGNOSIS — F0670 Mild neurocognitive disorder due to known physiological condition without behavioral disturbance: Secondary | ICD-10-CM

## 2020-12-08 NOTE — Patient Instructions (Addendum)
The hospital stopped the losartan ----and changed the clonidine to 2x a day     Dementia Dementia is a condition that affects the way the brain functions. It often affects memory and thinking. Usually, dementia gets worse with time and cannot be reversed (progressive dementia). There are many types of dementia, including:  Alzheimer's disease. This type is the most common.  Vascular dementia. This type may happen as the result of a stroke.  Lewy body dementia. This type may happen to people who have Parkinson's disease.  Frontotemporal dementia. This type is caused by damage to nerve cells (neurons) in certain parts of the brain. Some people may be affected by more than one type of dementia. This is called mixed dementia. What are the causes? Dementia is caused by damage to cells in the brain. The area of the brain and the types of cells damaged determine the type of dementia. Usually, this damage is irreversible or cannot be undone. Some examples of irreversible causes include:  Conditions that affect the blood vessels of the brain, such as diabetes, heart disease, or blood vessel disease.  Genetic mutations. In some cases, changes in the brain may be caused by another condition and can be reversed or slowed. Some examples of reversible causes include:  Injury to the brain.  Certain medicines.  Infection, such as meningitis.  Metabolic problems, such as vitamin B12 deficiency or thyroid disease.  Pressure on the brain, such as from a tumor, blood clot, or too much fluid in the brain (hydrocephalus).  Autoimmune diseases that affect the brain or arteries, such as limbic encephalitis or vasculitis. What are the signs or symptoms? Symptoms of dementia depend on the type of dementia. Common signs of dementia include problems with remembering, thinking, problem solving, decision making, and communicating. These signs develop slowly or get worse with time. This may include:  Problems  remembering events or people.  Having trouble taking a bath or putting clothes on.  Forgetting appointments or forgetting to pay bills.  Difficulty planning and preparing meals.  Having trouble speaking.  Getting lost easily.  Changes in behavior or mood. How is this diagnosed? This condition is diagnosed by a specialist (neurologist). It is diagnosed based on the history of your symptoms, your medical history, a physical exam, and tests. Tests may include:  Tests to evaluate brain function, such as memory tests, cognitive tests, and other tests.  Lab tests, such as blood or urine tests.  Imaging tests, such as a CT scan, a PET scan, or an MRI.  Genetic testing. This may be done if other family members have a diagnosis of certain types of dementia. Your health care provider will talk with you and your family, friends, or caregivers about your history and symptoms.   How is this treated? Treatment for this condition depends on the cause of the dementia. Progressive dementias, such as Alzheimer's disease, cannot be cured, but there may be treatments that help to manage symptoms. Treatment might involve taking medicines that may help to:  Control the dementia.  Slow down the progression of the dementia.  Manage symptoms. In some cases, treating the cause of your dementia can improve symptoms, reverse symptoms, or slow down how quickly your dementia becomes worse. Your health care provider can direct you to support groups, organizations, and other health care providers who can help with decisions about your care. Follow these instructions at home: Medicines  Take over-the-counter and prescription medicines only as told by your health care provider.  Use a pill organizer or pill reminder to help you manage your medicines.  Avoid taking medicines that can affect thinking, such as pain medicines or sleeping medicines. Lifestyle  Make healthy lifestyle choices. ? Be physically  active as told by your health care provider. ? Do not use any products that contain nicotine or tobacco, such as cigarettes, e-cigarettes, and chewing tobacco. If you need help quitting, ask your health care provider. ? Do not drink alcohol. ? Practice stress-management techniques when you get stressed. ? Spend time with other people.  Make sure to get quality sleep. These tips can help you get a good night's rest: ? Avoid napping during the day. ? Keep your sleeping area dark and cool. ? Avoid exercising during the few hours before you go to bed. ? Avoid caffeine products in the evening. Eating and drinking  Drink enough fluid to keep your urine pale yellow.  Eat a healthy diet. General instructions  Work with your health care provider to determine what you need help with and what your safety needs are.  Talk with your health care provider about whether it is safe for you to drive.  If you were given a bracelet that identifies you as a person with memory loss or tracks your location, make sure to wear it at all times.  Work with your family to make important decisions, such as advance directives, medical power of attorney, or a living will.  Keep all follow-up visits. This is important.   Where to find more information  Alzheimer's Association: CapitalMile.co.nz  National Institute on Aging: DVDEnthusiasts.nl  World Health Organization: RoleLink.com.br Contact a health care provider if:  You have any new or worsening symptoms.  You have problems with choking or swallowing. Get help right away if:  You feel depressed or sad, or feel that you want to harm yourself.  Your family members become concerned for your safety. If you ever feel like you may hurt yourself or others, or have thoughts about taking your own life, get help right away. Go to your nearest emergency department or:  Call your local emergency services (911 in the [place]  Call a suicide crisis helpline,  such as the [place] at 571 778 2047. This is open 24 hours a day in the [place]  Text the Crisis Text Line at (929)139-9147 (in the [place].). Summary  Dementia is a condition that affects the way the brain functions. Dementia often affects memory and thinking.  Usually, dementia gets worse with time and cannot be reversed (progressive dementia).  Treatment for this condition depends on the cause of the dementia.  Work with your health care provider to determine what you need help with and what your safety needs are.  Your health care provider can direct you to support groups, organizations, and other health care providers who can help with decisions about your care. This information is not intended to replace advice given to you by your health care provider. Make sure you discuss any questions you have with your health care provider. Document Revised: 01/14/2020 Document Reviewed: 01/14/2020 Elsevier Patient Education  [place].

## 2020-12-08 NOTE — Progress Notes (Signed)
Patient ID: [name] [name], male    DOB: 08-Aug-1932  Age: 87 y.o. MRN: [id number]    Subjective:  Subjective  HPI [name] [name] presents for a hospital follow up today, accompanied by his wife. He reports feeling well. He was at the ED on 11/25/2020 and was Dx with TIA. His wife reports that he has been taking 0.1 mg Catapres PO daily.  He states that he has not been taking at home BP.  The hosp also stopped to losartan but the pt has been taking it  Pt wife states the pt is doing much better .   BP Readings from Last 3 Encounters:  12/08/20 100/70  11/27/20 (!) 192/114  11/25/20 (!) 140/100  He denies any chest pain, SOB, fever, abdominal pain, cough, chills, sore throat, dysuria, urinary incontinence, back pain, HA, or N/VD at this time.    Review of Systems  Constitutional: Negative for chills, fatigue and fever.  HENT: Negative for congestion, ear pain, sinus pain and sore throat.   Eyes: Negative for pain.  Respiratory: Negative for cough and shortness of breath.   Cardiovascular: Negative for chest pain, palpitations and leg swelling.  Gastrointestinal: Negative for abdominal pain, blood in stool, constipation, diarrhea, nausea and vomiting.  Genitourinary: Negative for dysuria, frequency, hematuria and urgency.  Musculoskeletal: Negative for back pain and myalgias.  Neurological: Negative for headaches.    History Past Medical History:  Diagnosis Date  . Abdominal pain 05/19/2010  . Abnormal EKG 04/10/2009  . Acute cholecystitis 05/22/2010  . BPH without obstruction/lower urinary tract symptoms 12/08/2006  . Bradycardia 04/21/2012  . Coronary atherosclerosis of native coronary artery 04/07/2010  . Disorder resulting from impaired renal function 04/07/2007  . Elevated PSA   . Essential hypertension 12/08/2006  . Hearing loss 12/08/2006   no utilized hearing aids  . Hip pain, left 04/29/2008  . Hyperlipidemia 12/08/2006   LDL goal <100  . 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 Mild neurocognitive disorder due to Alzheimer's disease, possible 09/26/2020  . Primary open-angle glaucoma, severe stage 12/29/2006  . Renal insufficiency   . Weight loss observed 06/19/2008    He has a past surgical history that includes Transurethral resection of prostate; Cholecystectomy (03/2010); Colonoscopy; Tonsilectomy, adenoidectomy, bilateral myringotomy and tubes; removal of cyst; Shoulder surgery; Bladder surgery; and Cataract extraction.   His family history includes Alzheimer's disease in his father; Coronary artery disease in his mother; Diabetes in his sister; Heart disease in his mother; Heart disease (age of onset: 80) in his sister; Hypertension in his daughter, mother, and son.He reports that he quit smoking about 27 years ago. His smoking use included cigarettes. He has a 6.00 pack-year smoking history. He has never used smokeless tobacco. He reports previous drug use. Drug: Marijuana. He reports that he does not drink alcohol.  Current Outpatient Medications on File Prior to Visit  Medication Sig Dispense Refill  . atorvastatin (LIPITOR) 40 MG tablet Take 1 tablet (40 mg total) by mouth daily. 30 tablet 2  . brimonidine (ALPHAGAN) 0.2 % ophthalmic solution Place 1 drop into both eyes in the morning and at bedtime.    . cloNIDine (CATAPRES) 0.1 MG tablet Take 1 tablet (0.1 mg total) by mouth 2 (two) times daily. 60 tablet 1  . dorzolamide-timolol (COSOPT) 22.3-6.8 MG/ML ophthalmic solution Place 1 drop into both eyes 2 (two) times daily.    . hydrALAZINE (APRESOLINE) 25 MG tablet Take 1 tablet (25 mg  total) by mouth every 8 (eight) hours. 90 tablet 1  . metoprolol succinate (TOPROL-XL) 25 MG 24 hr tablet Take 0.5 tablets (12.5 mg total) by mouth daily. 30 tablet 0   No current facility-administered medications on file prior to visit.     Objective:  Objective  Physical Exam Vitals and nursing note reviewed.  Constitutional:       General: He is not in acute distress.    Appearance: He is well-developed.  HENT:     Head: Normocephalic and atraumatic.     Right Ear: External ear normal.     Left Ear: External ear normal.     Nose: Nose normal.  Eyes:     Extraocular Movements: Extraocular movements intact.     Pupils: Pupils are equal, round, and reactive to light.  Cardiovascular:     Rate and Rhythm: Normal rate and regular rhythm.     Pulses: Normal pulses.     Heart sounds: Normal heart sounds. No murmur heard. No friction rub. No gallop.   Pulmonary:     Effort: Pulmonary effort is normal. No respiratory distress.     Breath sounds: Normal breath sounds. No wheezing, rhonchi or rales.  Abdominal:     General: Bowel sounds are normal. There is no distension.     Palpations: Abdomen is soft.     Tenderness: There is no abdominal tenderness. There is no guarding.     Hernia: No hernia is present.  Musculoskeletal:        General: Normal range of motion.     Cervical back: Normal range of motion and neck supple.  Skin:    General: Skin is warm and dry.  Neurological:     Mental Status: He is alert and oriented to person, place, and time.  Psychiatric:        Thought Content: Thought content normal.    BP 100/70 (BP Location: Right Arm, Patient Position: Sitting, Cuff Size: Normal)   Pulse (!) 45   Temp (!) 97.5 F (36.4 C) (Oral)   Resp 18   Ht 5\' 9"  (1.753 m)   Wt 132 lb 3.2 oz (60 kg)   SpO2 99%   BMI 19.52 kg/m  Wt Readings from Last 3 Encounters:  12/08/20 132 lb 3.2 oz (60 kg)  11/25/20 132 lb (59.9 kg)  11/04/20 134 lb 6.4 oz (61 kg)     Lab Results  Component Value Date   WBC 6.8 11/26/2020   HGB 12.6 (L) 11/26/2020   HCT 36.0 (L) 11/26/2020   PLT 184 11/26/2020   GLUCOSE 72 11/26/2020   CHOL 158 11/26/2020   TRIG 79 11/26/2020   HDL 41 11/26/2020   LDLDIRECT 170.2 10/09/2007   LDLCALC 101 (H) 11/26/2020   ALT 8 11/26/2020   AST 15 11/26/2020   NA 138 11/26/2020   K 3.3  (L) 11/26/2020   CL 107 11/26/2020   CREATININE 1.45 (H) 11/26/2020   BUN 15 11/26/2020   CO2 23 11/26/2020   TSH 1.71 05/01/2019   PSA 31.37 (H) 04/06/2016   INR 1.0 11/25/2020   HGBA1C 4.8 11/26/2020   MICROALBUR 9.4 (H) 10/06/2015    MR BRAIN WO CONTRAST  Result Date: 11/26/2020 CLINICAL DATA:  Initial evaluation for neuro deficit, stroke suspected. EXAM: MRI HEAD WITHOUT CONTRAST TECHNIQUE: Multiplanar, multiecho pulse sequences of the brain and surrounding structures were obtained without intravenous contrast. COMPARISON:  Prior CTs from 11/25/2020 FINDINGS: Brain: Generalized age-related cerebral atrophy with mild chronic small  vessel ischemic disease. No abnormal foci of restricted diffusion to suggest acute or subacute ischemia. Gray-white matter differentiation maintained. No encephalomalacia to suggest chronic cortical infarction. No evidence for acute or chronic intracranial hemorrhage. No mass lesion, midline shift or mass effect. No hydrocephalus or extra-axial fluid collection. Note made of a partially empty sella. Midline structures intact. Vascular: Major intracranial vascular flow voids are maintained. Skull and upper cervical spine: Craniocervical junction within normal limits. Bone marrow signal intensity normal. No scalp soft tissue abnormality. Sinuses/Orbits: Patient status post bilateral ocular lens replacement. Globes and orbital soft tissues demonstrate no acute finding. Paranasal sinuses are largely clear. No mastoid effusion. Inner ear structures grossly normal. Other: None. IMPRESSION: 1. No acute intracranial abnormality. 2. Generalized age-related cerebral atrophy with mild chronic small vessel ischemic disease. Electronically Signed   By: Jeannine Boga M.D.   On: 11/26/2020 03:41   EEG adult  Result Date: 11/26/2020 Lora Havens, MD     11/26/2020  9:19 AM Patient Name: [name] [name] MRN: [id number] Epilepsy Attending: Lora Havens Referring  Physician/Provider: Dr. Marylu Lund Date: 11/25/2020 Duration: 24.14 mins Patient history: 87 year old male with altered mental status.  EEG to evaluate for seizures. Level of alertness: Awake, asleep AEDs during EEG study: None Technical aspects: This EEG study was done with scalp electrodes positioned according to the 10-20 International system of electrode placement. Electrical activity was acquired at a sampling rate of 500Hz  and reviewed with a high frequency filter of 70Hz  and a low frequency filter of 1Hz . EEG data were recorded continuously and digitally stored. Description: The posterior dominant rhythm consists of 8-9 Hz activity of moderate voltage (25-35 uV) seen predominantly in posterior head regions, symmetric and reactive to eye opening and eye closing. Sleep was characterized by sleep spindles (12 to 14 Hz), maximal frontocentral region.   Hyperventilation and photic stimulation were not performed.   IMPRESSION: This study is within normal limits. No seizures or epileptiform discharges were seen throughout the recording. Lora Havens   ECHOCARDIOGRAM COMPLETE  Result Date: 11/26/2020    ECHOCARDIOGRAM REPORT   Patient Name:   [name] [name] Date of Exam: 11/26/2020 Medical Rec #:  [id number]       Height:       69.0 in Accession #:    [id number]      Weight:       132.0 lb Date of Birth:  [birth year]        BSA:          1.732 m Patient Age:    65 years        BP:           174/89 mmHg Patient Gender: M               HR:           53 bpm. Exam Location:  Inpatient Procedure: 2D Echo, Cardiac Doppler, Color Doppler and Intracardiac            Opacification Agent Indications:    Stroke  History:        Patient has no prior history of Echocardiogram examinations.                 Risk Factors:Hypertension.  Sonographer:    Merrie Roof RDCS Referring Phys: [place]  1. There is severe asymmetric hypertrophy of the apical segments up to 1.6 cm. Findings could represent apical  variant hypertrophic cardiomyopathy and would recommend cardiac  MRI for better characterizationm as some of the images may be foreshortened. There is no apparent LV apical aneurysm or LV thrombus on contrast imaging. Left ventricular ejection fraction, by estimation, is 55 to 60%. The left ventricle has normal function. The left ventricle has no regional wall motion abnormalities. There is severe asymmetric left ventricular hypertrophy of the apical segment. Left ventricular diastolic parameters are consistent with Grade I diastolic dysfunction (impaired relaxation).  2. Right ventricular systolic function is normal. The right ventricular size is normal. Tricuspid regurgitation signal is inadequate for assessing PA pressure.  3. Left atrial size was mildly dilated.  4. The mitral valve is degenerative. Trivial mitral valve regurgitation. No evidence of mitral stenosis.  5. The aortic valve is tricuspid. There is mild thickening of the aortic valve. Aortic valve regurgitation is not visualized. Mild aortic valve sclerosis is present, with no evidence of aortic valve stenosis.  6. Aortic dilatation noted. There is mild dilatation of the aortic root and of the ascending aorta, measuring 41 mm.  7. The inferior vena cava is normal in size with greater than 50% respiratory variability, suggesting right atrial pressure of 3 mmHg. Conclusion(s)/Recommendation(s): No intracardiac source of embolism detected on this transthoracic study. A transesophageal echocardiogram is recommended to exclude cardiac source of embolism if clinically indicated. FINDINGS  Left Ventricle: There is severe asymmetric hypertrophy of the apical segments up to 1.6 cm. Findings could represent apical variant hypertrophic cardiomyopathy and would recommend cardiac MRI for better characterizationm as some of the images may be foreshortened. There is no apparent LV apical aneurysm or LV thrombus on contrast imaging. Left ventricular ejection fraction,  by estimation, is 55 to 60%. The left ventricle has normal function. The left ventricle has no regional wall motion abnormalities. Definity contrast agent was given IV to delineate the left ventricular endocardial borders. The left ventricular internal cavity size was normal in size. There is severe asymmetric left ventricular hypertrophy of the apical segment. Left ventricular diastolic parameters are consistent with Grade I diastolic dysfunction (impaired relaxation). Right Ventricle: The right ventricular size is normal. No increase in right ventricular wall thickness. Right ventricular systolic function is normal. Tricuspid regurgitation signal is inadequate for assessing PA pressure. Left Atrium: Left atrial size was mildly dilated. Right Atrium: Right atrial size was normal in size. Pericardium: Trivial pericardial effusion is present. Mitral Valve: The mitral valve is degenerative in appearance. There is mild calcification of the posterior mitral valve leaflet(s). Mild mitral annular calcification. Trivial mitral valve regurgitation. No evidence of mitral valve stenosis. Tricuspid Valve: The tricuspid valve is grossly normal. Tricuspid valve regurgitation is trivial. No evidence of tricuspid stenosis. Aortic Valve: The aortic valve is tricuspid. There is mild thickening of the aortic valve. Aortic valve regurgitation is not visualized. Mild aortic valve sclerosis is present, with no evidence of aortic valve stenosis. Pulmonic Valve: The pulmonic valve was grossly normal. Pulmonic valve regurgitation is trivial. No evidence of pulmonic stenosis. Aorta: Aortic dilatation noted. There is mild dilatation of the aortic root and of the ascending aorta, measuring 41 mm. Venous: The inferior vena cava is normal in size with greater than 50% respiratory variability, suggesting right atrial pressure of 3 mmHg. IAS/Shunts: The atrial septum is grossly normal.  LEFT VENTRICLE PLAX 2D LVIDd:         4.30 cm  Diastology  LVIDs:         3.30 cm  LV e' medial:    3.15 cm/s LV PW:  0.94 cm  LV E/e' medial:  15.7 LV IVS:        1.45 cm  LV e' lateral:   3.48 cm/s LVOT diam:     2.30 cm  LV E/e' lateral: 14.2 LV SV:         85 LV SV Index:   49 LVOT Area:     4.15 cm  RIGHT VENTRICLE RV Basal diam:  3.00 cm LEFT ATRIUM             Index       RIGHT ATRIUM           Index LA diam:        3.20 cm 1.85 cm/m  RA Area:     11.90 cm LA Vol (A2C):   59.9 ml 34.59 ml/m RA Volume:   21.10 ml  12.19 ml/m LA Vol (A4C):   72.4 ml 41.81 ml/m LA Biplane Vol: 66.4 ml 38.35 ml/m  AORTIC VALVE LVOT Vmax:   116.00 cm/s LVOT Vmean:  63.900 cm/s LVOT VTI:    0.204 m  AORTA Ao Root diam: 4.10 cm Ao Asc diam:  4.10 cm MITRAL VALVE MV Area (PHT): 1.94 cm    SHUNTS MV Decel Time: 391 msec    Systemic VTI:  0.20 m MV E velocity: 49.30 cm/s  Systemic Diam: 2.30 cm MV A velocity: 96.80 cm/s MV E/A ratio:  0.51 Eleonore Chiquito MD Electronically signed by Eleonore Chiquito MD Signature Date/Time: 11/26/2020/11:53:10 AM    Final    CT HEAD CODE STROKE WO CONTRAST  Result Date: 11/25/2020 CLINICAL DATA:  Code stroke. EXAM: CT HEAD WITHOUT CONTRAST TECHNIQUE: Contiguous axial images were obtained from the base of the skull through the vertex without intravenous contrast. COMPARISON:  None. FINDINGS: Brain: No acute intracranial hemorrhage, mass effect, or edema. Gray-white differentiation is preserved. Minimal patchy hypoattenuation in the supratentorial white matter is nonspecific but may reflect minor chronic microvascular ischemic changes. Chronic left thalamic insult reported on prior MRI is not present. Prominence of the ventricles and sulci reflects mild generalized parenchymal volume loss. Vascular: No hyperdense vessel. There is intracranial atherosclerotic calcification at the skull base. Skull: Unremarkable. Sinuses/Orbits: No acute abnormality. Other: None. ASPECTS ([place] Stroke Program Early CT Score) - Ganglionic level infarction (caudate,  lentiform nuclei, internal capsule, insula, M1-M3 cortex): 7 - Supraganglionic infarction (M4-M6 cortex): 3 Total score (0-10 with 10 being normal): 10 IMPRESSION: There is no acute intracranial hemorrhage or evidence of acute infarction. ASPECT score is 10. These results were called by telephone at the time of interpretation on 11/25/2020 at 10:15 am to provider Sherwood Gambler , who verbally acknowledged these results. Electronically Signed   By: Macy Mis M.D.   On: 11/25/2020 10:20   CT ANGIO HEAD CODE STROKE  Result Date: 11/25/2020 CLINICAL DATA:  Neuro deficit, acute, stroke suspected. Additional provided: Weakness, slumped over in car while headed to doctor's appointment, low blood sugar, stroke suspected. EXAM: CT ANGIOGRAPHY HEAD AND NECK TECHNIQUE: Multidetector CT imaging of the head and neck was performed using the standard protocol during bolus administration of intravenous contrast. Multiplanar CT image reconstructions and MIPs were obtained to evaluate the vascular anatomy. Carotid stenosis measurements (when applicable) are obtained utilizing NASCET criteria, using the distal internal carotid diameter as the denominator. CONTRAST:  68mL OMNIPAQUE IOHEXOL 350 MG/ML SOLN COMPARISON:  Noncontrast head CT performed earlier today 11/25/2020. FINDINGS: CTA NECK FINDINGS Aortic arch: Common origin of the innominate and left common carotid arteries. Atherosclerotic plaque  within the visualized aortic arch and proximal major branch vessels of the neck. No hemodynamically significant innominate or proximal subclavian artery stenosis. Right carotid system: CCA and ICA patent within the neck. Soft and calcified plaque within the carotid bifurcation and proximal ICA with less than 50% stenosis. Left carotid system: CCA and ICA patent within the neck. Prominent calcified plaque within the proximal ICA with estimated 70% stenosis (series 7, image 154). Vertebral arteries: Codominant and patent within the  neck without stenosis. Nonstenotic calcified plaque at the origin of the left vertebral artery. Skeleton: Cervical spondylosis. No acute bony abnormality or aggressive osseous lesion. Other neck: No neck mass or cervical lymphadenopathy. Subcentimeter right thyroid lobe nodule not meeting consensus criteria for ultrasound follow-up. Upper chest: Ill-defined and linear opacities within the left upper lobe with an appearance most suggestive of scarring on the coronal reconstructions. Review of the MIP images confirms the above findings CTA HEAD FINDING: Anterior circulation: The intracranial internal carotid arteries are patent. Calcified plaque with vessels with no more than mild stenosis. The M1 middle cerebral arteries are patent. No M2 proxi mal branch occlusion or high-grade proximal stenosis is identified. No intracranial aneurysm is identified. Posterior circulation: The intracranial vertebral arteries are patent. The basilar artery is patent. The posterior cerebral arteries are patent. High-grade focal stenosis within the right PCA at the P2/P3 junction (series 10, image 52). Posterior communicating arteries are hypoplastic or absent bilaterally. Venous sinuses: Within the limitations of contrast timing, no convincing thrombus. Anatomic variants: As described Review of the MIP images confirms the above findings These results were called by telephone at the time of interpretation on 11/25/2020 at 12:25 pm to provider Sherwood Gambler , who verbally acknowledged these results. IMPRESSION: CTA neck: 1. Calcified plaque within the proximal left ICA with estimated 70%. However, this apparent stenosis could be exaggerated by blooming artifact from calcified plaque. Consider a carotid artery duplex for further quantification of stenosis at this site. 2. Atherosclerotic plaque within the right carotid bifurcation and proximal ICA with less than 50% stenosis. 3. Vertebral arteries patent within the neck without stenosis.  CTA neck: 1. No intracranial large vessel occlusion. 2. High-grade focal stenosis within the right posterior cerebral artery at the P2/P3 junction. 3. Atherosclerotic plaque within the intracranial ICAs bilaterally with no more than mild stenosis. Electronically Signed   By: Kellie Simmering DO   On: 11/25/2020 12:25   CT ANGIO NECK CODE STROKE  Result Date: 11/25/2020 CLINICAL DATA:  Neuro deficit, acute, stroke suspected. Additional provided: Weakness, slumped over in car while headed to doctor's appointment, low blood sugar, stroke suspected. EXAM: CT ANGIOGRAPHY HEAD AND NECK TECHNIQUE: Multidetector CT imaging of the head and neck was performed using the standard protocol during bolus administration of intravenous contrast. Multiplanar CT image reconstructions and MIPs were obtained to evaluate the vascular anatomy. Carotid stenosis measurements (when applicable) are obtained utilizing NASCET criteria, using the distal internal carotid diameter as the denominator. CONTRAST:  29mL OMNIPAQUE IOHEXOL 350 MG/ML SOLN COMPARISON:  Noncontrast head CT performed earlier today 11/25/2020. FINDINGS: CTA NECK FINDINGS Aortic arch: Common origin of the innominate and left common carotid arteries. Atherosclerotic plaque within the visualized aortic arch and proximal major branch vessels of the neck. No hemodynamically significant innominate or proximal subclavian artery stenosis. Right carotid system: CCA and ICA patent within the neck. Soft and calcified plaque within the carotid bifurcation and proximal ICA with less than 50% stenosis. Left carotid system: CCA and ICA patent within the neck. Prominent  calcified plaque within the proximal ICA with estimated 70% stenosis (series 7, image 154). Vertebral arteries: Codominant and patent within the neck without stenosis. Nonstenotic calcified plaque at the origin of the left vertebral artery. Skeleton: Cervical spondylosis. No acute bony abnormality or aggressive osseous  lesion. Other neck: No neck mass or cervical lymphadenopathy. Subcentimeter right thyroid lobe nodule not meeting consensus criteria for ultrasound follow-up. Upper chest: Ill-defined and linear opacities within the left upper lobe with an appearance most suggestive of scarring on the coronal reconstructions. Review of the MIP images confirms the above findings CTA HEAD FINDING: Anterior circulation: The intracranial internal carotid arteries are patent. Calcified plaque with vessels with no more than mild stenosis. The M1 middle cerebral arteries are patent. No M2 proxi mal branch occlusion or high-grade proximal stenosis is identified. No intracranial aneurysm is identified. Posterior circulation: The intracranial vertebral arteries are patent. The basilar artery is patent. The posterior cerebral arteries are patent. High-grade focal stenosis within the right PCA at the P2/P3 junction (series 10, image 52). Posterior communicating arteries are hypoplastic or absent bilaterally. Venous sinuses: Within the limitations of contrast timing, no convincing thrombus. Anatomic variants: As described Review of the MIP images confirms the above findings These results were called by telephone at the time of interpretation on 11/25/2020 at 12:25 pm to provider Sherwood Gambler , who verbally acknowledged these results. IMPRESSION: CTA neck: 1. Calcified plaque within the proximal left ICA with estimated 70%. However, this apparent stenosis could be exaggerated by blooming artifact from calcified plaque. Consider a carotid artery duplex for further quantification of stenosis at this site. 2. Atherosclerotic plaque within the right carotid bifurcation and proximal ICA with less than 50% stenosis. 3. Vertebral arteries patent within the neck without stenosis. CTA neck: 1. No intracranial large vessel occlusion. 2. High-grade focal stenosis within the right posterior cerebral artery at the P2/P3 junction. 3. Atherosclerotic plaque  within the intracranial ICAs bilaterally with no more than mild stenosis. Electronically Signed   By: Kellie Simmering DO   On: 11/25/2020 12:25     Assessment & Plan:  Plan    No orders of the defined types were placed in this encounter.   Problem List Items Addressed This Visit      Unprioritized   Essential hypertension    Running low today--- instructed pt to take the clonidine bid and stop losartan F/u 2-3 weeks for bp check with nurse Pt was also put on hydralazine       Mild neurocognitive disorder due to Alzheimer's disease, possible    F/u neuro       Other Visit Diagnoses    Alzheimer's dementia without behavioral disturbance, unspecified timing of dementia onset ([place])    -  Primary   Primary hypertension          Follow-up: Return in about 2 weeks (around 12/22/2020), or if symptoms worsen or fail to improve, for for bp check with nurse .   I,Gordon Zheng,acting as a Education administrator for [employer], DO.,have documented all relevant documentation on the behalf of [name], DO,as directed by  [name], DO while in the presence of [place], DO, have reviewed all documentation for this visit. The documentation on 12/08/20 for the exam, diagnosis, procedures, and orders are all accurate and complete.  [name], DO

## 2020-12-08 NOTE — Assessment & Plan Note (Signed)
Running low today--- instructed pt to take the clonidine bid and stop losartan F/u 2-3 weeks for bp check with nurse Pt was also put on hydralazine

## 2020-12-08 NOTE — Assessment & Plan Note (Signed)
F/u neuro  

## 2020-12-09 ENCOUNTER — Encounter: Payer: Self-pay | Admitting: Neurology

## 2020-12-09 ENCOUNTER — Ambulatory Visit: Payer: Federal, State, Local not specified - PPO | Admitting: Neurology

## 2020-12-09 VITALS — BP 198/109 | HR 50 | Ht 69.0 in | Wt 133.0 lb

## 2020-12-09 DIAGNOSIS — I10 Essential (primary) hypertension: Secondary | ICD-10-CM

## 2020-12-09 DIAGNOSIS — F321 Major depressive disorder, single episode, moderate: Secondary | ICD-10-CM | POA: Diagnosis not present

## 2020-12-09 DIAGNOSIS — G309 Alzheimer's disease, unspecified: Secondary | ICD-10-CM

## 2020-12-09 DIAGNOSIS — G3184 Mild cognitive impairment, so stated: Secondary | ICD-10-CM | POA: Diagnosis not present

## 2020-12-09 MED ORDER — SERTRALINE HCL 25 MG PO TABS: 25.0000 mg | ORAL_TABLET | Freq: Every day | ORAL | 11 refills | Status: DC

## 2020-12-09 NOTE — Progress Notes (Signed)
NEUROLOGY CONSULTATION NOTE  MCKENNA [name] MRN: [id number] DOB: September 23, 1931  Referring provider: Dr. Hazle Coca Primary care provider: Dr. Roma Schanz  Reason for consult:  Mild Neurocognitive Disorder  Dear Dr [name] [name]:  Thank you for your kind referral of [name] [name] for consultation of the above symptoms. Although his history is well known to you, please allow me to reiterate it for the purpose of our medical record. The patient was accompanied to the clinic by his wife who also provides collateral information. Records and images were personally reviewed where available.   HISTORY OF PRESENT ILLNESS: This is an 87 year old right-handed man with a history of hypertension, hyperlipidemia, CAD, bradycardia, presenting for evaluation of Mild Neurocognitive disorder, etiology concerning for Alzheimer's disease. He is accompanied by his wife who helps supplement the history today. He underwent Neuropsychological evaluation in January 2022 with Dr. Melvyn [name], results limited due to visual impairment, however pattern of performance is suggestive of fairly diffuse cognitive impairment concerning for late-onset Alzheimer's disease. He largely denied difficulties completing ADLs, which his wife did not strongly contradict, meeting criteria for Mild Neurocognitive disorder, likely at severe end of this spectrum and at increased risk for conversion to Major Neurocognitive disorder.   He states his memory is "kind of terrible." His wife started noticing mood swings, but over the past year he has had progressively worsening memory where he would forget things, misplacing things frequently. He stopped driving in 3428 due to vision issues. His wife also took over writing his checks due to vision changes, however they still have separate accounts and his son is his POA. He states she manages some medications, he does some of them, and he sometimes forgets. He cooks sometimes, but his wife has noticed he  has forgotten how to cook some foods, such a steak he made recently. He has not left the stove on. He is independent with dressing and bathing. His father had memory issues. He was in the [employer] where he had head injuries, no neurosurgical procedures. He denies any alcohol use. He reports his mood is terrible, he gets frustrated not being able to do things. His wife reports he does not remember where his money is, he gets frustrated and can't get it together when he counts it. His wife also reports visual hallucinations for the past 3-4 months, he sees people in the house, talking to little children. He denies any auditory component. He sleeps well at night, however sleeps during the day as well, taking naps when he sits down.  He was admitted to [facility] last 11/25/20 for acute encephalopathy. His wife reports they were in the car to Dr. [name] office when he suddenly got confused and could hardly get out of the car. When they got to the parking lot, he was walking around in circles.  His wife had reported that he "fell asleep" and when they got to the office he was too weak to get out and walk on his own, needing a wheelchair. BP was 140/100. He was noted to have slurred speech, very lethargic, falling asleep on the wheelchair. He was transferred to [facility], BP was 207/87, HR as low as the 30s at some point during his stay, at that point conversing appropriately. I personally reviewed MRI brain without contrast which did not show any acute changes. There was moderate diffuse atrophy and mild chronic microvascular disease. CTA showed calcified plaque within the proximal left ICA with estimated 70% stenosis, however this could  be exaggerated by blooming artifact from calcified plaque, right ICA less than 50% stenosis. His wife notes he was still confused and kept thinking that he was in a shack and not in the hospital (note of in-hospital delirium). EEG was normal. He denies any headaches, dizziness, diplopia,  dysarthria/dysphagia, neck/back pain, focal numbness/tingling/weakness, bowel/bladder dysfunction, anosmia, or tremors.    Laboratory Data: Lab Results  Component Value Date   TSH 1.71 05/01/2019   Lab Results  Component Value Date   [id number] 542 05/01/2019     PAST MEDICAL HISTORY: Past Medical History:  Diagnosis Date  . Abdominal pain 05/19/2010  . Abnormal EKG 04/10/2009  . Acute cholecystitis 05/22/2010  . BPH without obstruction/lower urinary tract symptoms 12/08/2006  . Bradycardia 04/21/2012  . Coronary atherosclerosis of native coronary artery 04/07/2010  . Disorder resulting from impaired renal function 04/07/2007  . Elevated PSA   . Essential hypertension 12/08/2006  . Hearing loss 12/08/2006   no utilized hearing aids  . Hip pain, left 04/29/2008  . Hyperlipidemia 12/08/2006   LDL goal <100  . 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 Mild neurocognitive disorder due to Alzheimer's disease, possible 09/26/2020  . Primary open-angle glaucoma, severe stage 12/29/2006  . Renal insufficiency   . Weight loss observed 06/19/2008    PAST SURGICAL HISTORY: Past Surgical History:  Procedure Laterality Date  . BLADDER SURGERY    . CATARACT EXTRACTION    . CHOLECYSTECTOMY  03/2010   [place] [facility]  . COLONOSCOPY    . removal of cyst     right arm  . SHOULDER SURGERY     cyst removed left shoulder  . TONSILECTOMY, ADENOIDECTOMY, BILATERAL MYRINGOTOMY AND TUBES    . TRANSURETHRAL RESECTION OF PROSTATE      MEDICATIONS: Current Outpatient Medications on File Prior to Visit  Medication Sig Dispense Refill  . atorvastatin (LIPITOR) 40 MG tablet Take 1 tablet (40 mg total) by mouth daily. 30 tablet 2  . brimonidine (ALPHAGAN) 0.2 % ophthalmic solution Place 1 drop into both eyes in the morning and at bedtime.    . cloNIDine (CATAPRES) 0.1 MG tablet Take 1 tablet (0.1 mg total) by mouth 2 (two) times daily. 60 tablet 1  .  dorzolamide-timolol (COSOPT) 22.3-6.8 MG/ML ophthalmic solution Place 1 drop into both eyes 2 (two) times daily.    . hydrALAZINE (APRESOLINE) 25 MG tablet Take 1 tablet (25 mg total) by mouth every 8 (eight) hours. 90 tablet 1  . metoprolol succinate (TOPROL-XL) 25 MG 24 hr tablet Take 0.5 tablets (12.5 mg total) by mouth daily. 30 tablet 0   No current facility-administered medications on file prior to visit.    ALLERGIES: Allergies  Allergen Reactions  . Latanoprost Other (See Comments)    Eye irritation  . Netarsudil Other (See Comments)    Eye burning and irritation    FAMILY HISTORY: Family History  Problem Relation Age of Onset  . Coronary artery disease Mother   . Hypertension Mother   . Heart disease Mother   . Diabetes Sister   . Heart disease Sister 51       died during cabg  . Hypertension Daughter   . Hypertension Son   . Alzheimer's disease Father     SOCIAL HISTORY: Social History   Socioeconomic History  . Marital status: Married    Spouse name: Not on file  . Number of children: Not on file  . Years of education: 87  .  Highest education level: Some college, no degree  Occupational History  . Occupation: retired Actor  Tobacco Use  . Smoking status: Former Smoker    Packs/day: 0.30    Years: 20.00    Pack years: 6.00    Types: Cigarettes    Quit date: 02/22/1993    Years since quitting: 27.8  . Smokeless tobacco: Never Used  Vaping Use  . Vaping Use: Never used  Substance and Sexual Activity  . Alcohol use: No  . Drug use: Not Currently    Types: Marijuana    Comment: past marijuana use; none currently  . Sexual activity: Not Currently    Partners: Female  Other Topics Concern  . Not on file  Social History Narrative   Exercise--- stair step,  Sit ups, pull ups and bike   Lives with wife   Right handed    Social Determinants of Health   Financial Resource Strain: Not on file  Food Insecurity: Not on file  Transportation  Needs: Not on file  Physical Activity: Not on file  Stress: Not on file  Social Connections: Not on file  Intimate Partner Violence: Not on file     PHYSICAL EXAM: Vitals:   12/09/20 0832 12/09/20 0839  BP: (!) 190/103 (!) 198/109  Pulse: (!) 50   SpO2: 100%    General: No acute distress Head:  Normocephalic/atraumatic Skin/Extremities: No rash, no edema Neurological Exam: Mental status: alert and awake. No dysarthria or aphasia, Fund of knowledge is reduced.  Recent and remote memory are impaired.  Attention and concentration are normal.  Cranial nerves: CN I: not tested CN II: pupils equal, round and reactive to light, visual fields intact CN III, IV, VI:  full range of motion, no nystagmus, no ptosis CN V: facial sensation intact CN VII: upper and lower face symmetric CN VIII: hearing intact to conversation CN XI: sternocleidomastoid and trapezius muscles intact CN XII: tongue midline Bulk & Tone: normal, no fasciculations. Motor: 5/5 throughout with no pronator drift. Sensation: intact to light touch, cold, vibration sense.  No extinction to double simultaneous stimulation.  Romberg test negative Deep Tendon Reflexes: +2 throughout Cerebellar: no incoordination on finger to nose testing Gait: slow and cautious, no ataxia, difficulty with tandem walk Tremor: none   IMPRESSION: This is an 87 year old right-handed man with a history of hypertension, hyperlipidemia, CAD, bradycardia, with Neurocognitive testing in January 2022 indicating Mild Neurocognitive disorder, etiology concerning for Alzheimer's disease. Finding discussed with the patient and his wife. We discussed risks and benefits of medications in dementia, and agreed that the risks/side effects at his age outweigh potential benefits. His wife is reporting more mood swings, as well as visual hallucinations. We discussed starting an SSRI, side effects of Sertraline 25mg  daily discussed. We also discussed increasing  supervision with medications and taking firearms out of the house. There appears to be some family/marital discord, his son is his POA and his wife states son does not speak to her. Discussed prognosis and need for future planning, eventual need for increased help at home. He does not drive. He was admitted last week for malignant hypertension with encephalopathy, MRI brain no acute stroke. BP today again elevated, 190/103, wife advised to monitor BP and show BP log to Dr. Cheri Rous. Follow-up in 6 months, call for any changes.   Thank you for allowing me to participate in the care of this patient. Please do not hesitate to call for any questions or concerns.  Ellouise Newer, M.D.  CC: Dr. Cheri Rous

## 2020-12-09 NOTE — Patient Instructions (Signed)
1. Start Sertraline 25mg  daily  2. Recommend having your wife manage medications. Take guns out of the house  3. Follow-up in 6-8 months, call for any changes   FALL PRECAUTIONS: Be cautious when walking. Scan the area for obstacles that may increase the risk of trips and falls. When getting up in the mornings, sit up at the edge of the bed for a few minutes before getting out of bed. Consider elevating the bed at the head end to avoid drop of blood pressure when getting up. Walk always in a well-lit room (use night lights in the walls). Avoid area rugs or power cords from appliances in the middle of the walkways. Use a walker or a cane if necessary and consider physical therapy for balance exercise. Get your eyesight checked regularly.  HOME SAFETY: Consider the safety of the kitchen when operating appliances like stoves, microwave oven, and blender. Consider having supervision and share cooking responsibilities until no longer able to participate in those. Accidents with firearms and other hazards in the house should be identified and addressed as well.  ABILITY TO BE LEFT ALONE: If patient is unable to contact 911 operator, consider using LifeLine, or when the need is there, arrange for someone to stay with patients. Smoking is a fire hazard, consider supervision or cessation. Risk of wandering should be assessed by caregiver and if detected at any point, supervision and safe proof recommendations should be instituted.  MEDICATION SUPERVISION: Inability to self-administer medication needs to be constantly addressed. Implement a mechanism to ensure safe administration of the medications.  RECOMMENDATIONS FOR ALL PATIENTS WITH MEMORY PROBLEMS: 1. Continue to exercise (Recommend 30 minutes of walking everyday, or 3 hours every week) 2. Increase social interactions - continue going to [place] and enjoy social gatherings with friends and family 3. Eat healthy, avoid fried foods and eat more fruits and  vegetables 4. Maintain adequate blood pressure, blood sugar, and blood cholesterol level. Reducing the risk of stroke and cardiovascular disease also helps promoting better memory. 5. Avoid stressful situations. Live a simple life and avoid aggravations. Organize your time and prepare for the next day in anticipation. 6. Sleep well, avoid any interruptions of sleep and avoid any distractions in the bedroom that may interfere with adequate sleep quality 7. Avoid sugar, avoid sweets as there is a strong link between excessive sugar intake, diabetes, and cognitive impairment The Mediterranean diet has been shown to help patients reduce the risk of progressive memory disorders and reduces cardiovascular risk. This includes eating fish, eat fruits and green leafy vegetables, nuts like almonds and hazelnuts, walnuts, and also use olive oil. Avoid fast foods and fried foods as much as possible. Avoid sweets and sugar as sugar use has been linked to worsening of memory function.  There is always a concern of gradual progression of memory problems. If this is the case, then we may need to adjust level of care according to patient needs. Support, both to the patient and caregiver, should then be put into place.

## 2020-12-24 ENCOUNTER — Ambulatory Visit (INDEPENDENT_AMBULATORY_CARE_PROVIDER_SITE_OTHER): Payer: Federal, State, Local not specified - PPO | Admitting: Family Medicine

## 2020-12-24 ENCOUNTER — Other Ambulatory Visit: Payer: Self-pay

## 2020-12-24 DIAGNOSIS — I10 Essential (primary) hypertension: Secondary | ICD-10-CM

## 2020-12-24 NOTE — Progress Notes (Signed)
Pt here for Blood pressure check per Dr.Lowne  Pt currently takes:metoprolol succinate (TOPROL-XL) 25 MG 24 hr tablet  Taking 0.5 tablets (12.5 mg total) by mouth daily.   Pt reports compliance with medication.  BP today @ =108/64 HR =69  Pt advised per Dr. Lazaro Arms) would like pt to continue medication regimen

## 2021-02-17 ENCOUNTER — Other Ambulatory Visit (HOSPITAL_BASED_OUTPATIENT_CLINIC_OR_DEPARTMENT_OTHER): Payer: Self-pay

## 2021-02-17 ENCOUNTER — Ambulatory Visit: Payer: Federal, State, Local not specified - PPO | Attending: Internal Medicine

## 2021-02-17 DIAGNOSIS — Z23 Encounter for immunization: Secondary | ICD-10-CM

## 2021-02-17 MED ORDER — PFIZER-BIONT COVID-19 VAC-TRIS 30 MCG/0.3ML IM SUSP
INTRAMUSCULAR | 0 refills | Status: DC
  Filled 2021-02-17: qty 0.3, 1d supply, fill #0

## 2021-02-17 NOTE — Progress Notes (Signed)
   Covid-19 Vaccination Clinic  Name:  [name] [name]    MRN: [id number] DOB: [birth year]  02/17/2021  [name] was observed post Covid-19 immunization for 15 minutes without incident. He was provided with Vaccine Information Sheet and instruction to access the V-Safe system.   [name] was instructed to call 911 with any severe reactions post vaccine: Marland Kitchen Difficulty breathing  . Swelling of face and throat  . A fast heartbeat  . A bad rash all over body  . Dizziness and weakness   Immunizations Administered    Name Date Dose VIS Date Route   PFIZER Comrnaty(Gray TOP) Covid-19 Vaccine 02/17/2021  9:28 AM 0.3 mL 08/21/2020 Intramuscular   Manufacturer: [place]   Lot: [id number]   [place]ora: [phone]

## 2021-05-11 ENCOUNTER — Other Ambulatory Visit: Payer: Self-pay | Admitting: Family Medicine

## 2021-05-11 ENCOUNTER — Encounter: Payer: Self-pay | Admitting: Family Medicine

## 2021-05-11 ENCOUNTER — Ambulatory Visit (INDEPENDENT_AMBULATORY_CARE_PROVIDER_SITE_OTHER): Payer: Federal, State, Local not specified - PPO | Admitting: Family Medicine

## 2021-05-11 ENCOUNTER — Other Ambulatory Visit: Payer: Self-pay

## 2021-05-11 VITALS — BP 148/90 | HR 53 | Temp 98.1°F | Resp 18 | Ht 69.0 in | Wt 135.2 lb

## 2021-05-11 DIAGNOSIS — I2510 Atherosclerotic heart disease of native coronary artery without angina pectoris: Secondary | ICD-10-CM

## 2021-05-11 DIAGNOSIS — N400 Enlarged prostate without lower urinary tract symptoms: Secondary | ICD-10-CM

## 2021-05-11 DIAGNOSIS — G309 Alzheimer's disease, unspecified: Secondary | ICD-10-CM

## 2021-05-11 DIAGNOSIS — F32A Depression, unspecified: Secondary | ICD-10-CM | POA: Diagnosis not present

## 2021-05-11 DIAGNOSIS — Z0000 Encounter for general adult medical examination without abnormal findings: Secondary | ICD-10-CM | POA: Diagnosis not present

## 2021-05-11 DIAGNOSIS — Z125 Encounter for screening for malignant neoplasm of prostate: Secondary | ICD-10-CM

## 2021-05-11 DIAGNOSIS — Z23 Encounter for immunization: Secondary | ICD-10-CM

## 2021-05-11 DIAGNOSIS — I10 Essential (primary) hypertension: Secondary | ICD-10-CM | POA: Diagnosis not present

## 2021-05-11 DIAGNOSIS — F0280 Dementia in other diseases classified elsewhere without behavioral disturbance: Secondary | ICD-10-CM

## 2021-05-11 DIAGNOSIS — G3184 Mild cognitive impairment, so stated: Secondary | ICD-10-CM

## 2021-05-11 DIAGNOSIS — E785 Hyperlipidemia, unspecified: Secondary | ICD-10-CM | POA: Diagnosis not present

## 2021-05-11 LAB — LIPID PANEL
Cholesterol: 137 mg/dL (ref 0–200)
HDL: 41.9 mg/dL (ref >39.00)
LDL Cholesterol: 80 mg/dL (ref 0–99)
NonHDL: 95.1
Total CHOL/HDL Ratio: 3
Triglycerides: 76 mg/dL (ref 0.0–149.0)
VLDL: 15.2 mg/dL (ref 0.0–40.0)

## 2021-05-11 LAB — COMPREHENSIVE METABOLIC PANEL
ALT: 8 U/L (ref 0–53)
AST: 16 U/L (ref 0–37)
Albumin: 3.4 g/dL — ABNORMAL LOW (ref 3.5–5.2)
Alkaline Phosphatase: 47 U/L (ref 39–117)
BUN: 16 mg/dL (ref 6–23)
CO2: 25 mEq/L (ref 19–32)
Calcium: 8.5 mg/dL (ref 8.4–10.5)
Chloride: 108 mEq/L (ref 96–112)
Creatinine, Ser: 1.45 mg/dL (ref 0.40–1.50)
GFR: 43.38 mL/min — ABNORMAL LOW (ref >60.00)
Glucose, Bld: 79 mg/dL (ref 70–99)
Potassium: 4.1 mEq/L (ref 3.5–5.1)
Sodium: 140 mEq/L (ref 135–145)
Total Bilirubin: 1.2 mg/dL (ref 0.2–1.2)
Total Protein: 6.5 g/dL (ref 6.0–8.3)

## 2021-05-11 LAB — POC URINALSYSI DIPSTICK (AUTOMATED)
Blood, UA: NEGATIVE
Glucose, UA: NEGATIVE
Leukocytes, UA: NEGATIVE
Nitrite, UA: NEGATIVE
Protein, UA: POSITIVE — AB
Spec Grav, UA: 1.02 (ref 1.010–1.025)
Urobilinogen, UA: 0.2 E.U./dL
pH, UA: 6 (ref 5.0–8.0)

## 2021-05-11 LAB — CBC WITH DIFFERENTIAL/PLATELET
Basophils Absolute: 0 10*3/uL (ref 0.0–0.1)
Basophils Relative: 0.6 % (ref 0.0–3.0)
Eosinophils Absolute: 0.1 10*3/uL (ref 0.0–0.7)
Eosinophils Relative: 1.5 % (ref 0.0–5.0)
HCT: 34.5 % — ABNORMAL LOW (ref 39.0–52.0)
Hemoglobin: 11.5 g/dL — ABNORMAL LOW (ref 13.0–17.0)
Lymphocytes Relative: 18.4 % (ref 12.0–46.0)
Lymphs Abs: 1.1 10*3/uL (ref 0.7–4.0)
MCHC: 33.2 g/dL (ref 30.0–36.0)
MCV: 92.5 fl (ref 78.0–100.0)
Monocytes Absolute: 0.6 10*3/uL (ref 0.1–1.0)
Monocytes Relative: 9 % (ref 3.0–12.0)
Neutro Abs: 4.3 10*3/uL (ref 1.4–7.7)
Neutrophils Relative %: 70.5 % (ref 43.0–77.0)
Platelets: 164 10*3/uL (ref 150.0–400.0)
RBC: 3.73 Mil/uL — ABNORMAL LOW (ref 4.22–5.81)
RDW: 13 % (ref 11.5–15.5)
WBC: 6.1 10*3/uL (ref 4.0–10.5)

## 2021-05-11 LAB — TSH: TSH: 1.73 u[IU]/mL (ref 0.35–5.50)

## 2021-05-11 LAB — PSA: PSA: 52.33 ng/mL — ABNORMAL HIGH (ref 0.10–4.00)

## 2021-05-11 MED ORDER — ATORVASTATIN CALCIUM 40 MG PO TABS: 40.0000 mg | ORAL_TABLET | Freq: Every day | ORAL | 1 refills | Status: DC

## 2021-05-11 MED ORDER — SERTRALINE HCL 25 MG PO TABS: 25.0000 mg | ORAL_TABLET | Freq: Every day | ORAL | 1 refills | Status: DC

## 2021-05-11 MED ORDER — HYDRALAZINE HCL 25 MG PO TABS: 25.0000 mg | ORAL_TABLET | Freq: Three times a day (TID) | ORAL | 1 refills | Status: DC

## 2021-05-11 NOTE — Assessment & Plan Note (Signed)
Pt noncompliant with meds meds refilled today and discussed with his wife the importance of taking his medication

## 2021-05-11 NOTE — Assessment & Plan Note (Signed)
Check labs today.

## 2021-05-11 NOTE — Progress Notes (Signed)
Established Patient Office Visit  Subjective:  Patient ID: GAD EMGE, male    DOB: 1932-09-09  Age: 87 y.o. MRN: [id number]  CC:  Chief Complaint  Patient presents with   Annual Exam    Pt states fasting     HPI [name] [name] presents for cpe and f/u bp , cholesterol.   His wife is with him and wants to go over his meds.  [name] [name] has not been taking all his medication.   His dementia is worsening and [name] [name] has started hallucinating a little bit.  [name] [name] ran from home a few weeks ago and the police had to bring him home    [name] [name] has an appointment with neuro next month.    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; chronic left thalamic   Mild neurocognitive disorder due to Alzheimer's disease, possible 09/26/2020   Primary open-angle glaucoma, severe stage 12/29/2006   Renal insufficiency    Weight loss observed 06/19/2008    Past Surgical History:  Procedure Laterality Date   BLADDER SURGERY     CATARACT EXTRACTION     CHOLECYSTECTOMY  03/2010   [place] [facility]   COLONOSCOPY     removal of cyst     right arm   SHOULDER SURGERY     cyst removed left shoulder   TONSILECTOMY, ADENOIDECTOMY, BILATERAL MYRINGOTOMY AND TUBES     TRANSURETHRAL RESECTION OF PROSTATE      Family History  Problem Relation Age of Onset   Coronary artery disease Mother    Hypertension Mother    Heart disease Mother    Diabetes Sister    Heart disease Sister 61       died  during cabg   Hypertension Daughter    Hypertension Son    Alzheimer's disease Father     Social History   Socioeconomic History   Marital status: Married    Spouse name: Not on file   Number of children: Not on file   Years of education: 14   Highest education level: Some college, no degree  Occupational History   Occupation: retired Actor  Tobacco Use   Smoking status: Former    Packs/day: 0.30    Years: 20.00    Pack years: 6.00    Types: Cigarettes    Quit date: 02/22/1993    Years since quitting: 28.2   Smokeless tobacco: Never  Vaping Use   Vaping Use: Never used  Substance and Sexual Activity   Alcohol use: No   Drug use: Not Currently    Types: Marijuana    Comment: past marijuana use; none currently   Sexual activity: Not Currently    Partners: Female  Other Topics Concern   Not on file  Social History Narrative   Exercise--- stair step,  Sit ups, pull ups and bike   Lives with wife   Right handed    Social Determinants of Health   Financial Resource Strain:  Not on file  Food Insecurity: Not on file  Transportation Needs: Not on file  Physical Activity: Not on file  Stress: Not on file  Social Connections: Not on file  Intimate Partner Violence: Not on file    Outpatient Medications Prior to Visit  Medication Sig Dispense Refill   brimonidine (ALPHAGAN) 0.2 % ophthalmic solution Place 1 drop into both eyes in the morning and at bedtime.     cloNIDine (CATAPRES) 0.1 MG tablet Take 1 tablet (0.1 mg total) by mouth 2 (two) times daily. 60 tablet 1   dorzolamide-timolol (COSOPT) 22.3-6.8 MG/ML ophthalmic solution Place 1 drop into both eyes 2 (two) times daily.     metoprolol succinate (TOPROL-XL) 25 MG 24 hr tablet Take 0.5 tablets (12.5 mg total) by mouth daily. 30 tablet 0   atorvastatin (LIPITOR) 40 MG tablet Take 1 tablet (40 mg total) by mouth daily. (Patient not taking: Reported on 05/11/2021) 30 tablet 2   COVID-19 mRNA Vac-TriS, Pfizer,  (PFIZER-BIONT COVID-19 VAC-TRIS) SUSP injection Inject into the muscle. (Patient not taking: Reported on 05/11/2021) 0.3 mL 0   hydrALAZINE (APRESOLINE) 25 MG tablet Take 1 tablet (25 mg total) by mouth every 8 (eight) hours. (Patient not taking: Reported on 05/11/2021) 90 tablet 1   sertraline (ZOLOFT) 25 MG tablet Take 1 tablet (25 mg total) by mouth daily. (Patient not taking: Reported on 05/11/2021) 30 tablet 11   No facility-administered medications prior to visit.    Allergies  Allergen Reactions   Latanoprost Other (See Comments)    Eye irritation   Netarsudil Other (See Comments)    Eye burning and irritation    ROS Review of Systems  Constitutional:  Negative for appetite change, diaphoresis, fatigue and unexpected weight change.  Eyes:  Negative for pain, redness and visual disturbance.  Respiratory:  Negative for cough, chest tightness, shortness of breath and wheezing.   Cardiovascular:  Negative for chest pain, palpitations and leg swelling.  Endocrine: Negative for cold intolerance, heat intolerance, polydipsia, polyphagia and polyuria.  Genitourinary:  Negative for difficulty urinating, dysuria and frequency.  Neurological:  Negative for dizziness, tremors, weakness, light-headedness, numbness and headaches.  Hematological:  Negative for adenopathy.  Psychiatric/Behavioral:  Positive for confusion, dysphoric mood and hallucinations. Negative for agitation, decreased concentration, self-injury, sleep disturbance and suicidal ideas. The patient is not nervous/anxious.      Objective:    Physical Exam Vitals and nursing note reviewed.  Constitutional:      General: [name] [name] is not in acute distress.    Appearance: [name] [name] is well-developed. [name] [name] is not diaphoretic.  HENT:     Head: Normocephalic and atraumatic.     Right Ear: External ear normal.     Left Ear: External ear normal.     Nose: Nose normal.     Mouth/Throat:     Pharynx: No oropharyngeal exudate.  Eyes:      General:        Right eye: No discharge.        Left eye: No discharge.     Conjunctiva/sclera: Conjunctivae normal.     Pupils: Pupils are equal, round, and reactive to light.  Neck:     Thyroid: No thyromegaly.     Vascular: No JVD.  Cardiovascular:     Rate and Rhythm: Normal rate and regular rhythm.     Heart sounds: No murmur heard.   No friction rub. No gallop.  Pulmonary:     Effort: Pulmonary effort is normal. No respiratory distress.  Breath sounds: Normal breath sounds. No wheezing or rales.  Chest:     Chest wall: No tenderness.  Abdominal:     General: Bowel sounds are normal. There is no distension.     Palpations: Abdomen is soft. There is no mass.     Tenderness: no abdominal tenderness There is no guarding or rebound.  Genitourinary:    Rectum: Normal.  Musculoskeletal:        General: No tenderness. Normal range of motion.     Cervical back: Normal range of motion and neck supple.  Lymphadenopathy:     Cervical: No cervical adenopathy.  Skin:    General: Skin is warm and dry.     Coloration: Skin is not pale.     Findings: No erythema or rash.  Neurological:     Mental Status: [name] [name] is alert and oriented to person, place, and time.     Motor: No abnormal muscle tone.     Deep Tendon Reflexes: Reflexes are normal and symmetric. Reflexes normal.  Psychiatric:        Attention and Perception: [name] [name] does not perceive visual hallucinations.        Mood and Affect: Mood is depressed. Affect is flat.        Speech: Speech normal.        Behavior: Behavior normal. Behavior is cooperative.        Thought Content: Thought content normal. Thought content is not paranoid. Thought content does not include homicidal or suicidal ideation. Thought content does not include homicidal or suicidal plan.        Cognition and Memory: Cognition is impaired. Memory is impaired.        Judgment: Judgment normal.     Comments: Pt answers questions appropriately today Has had episodes  of visual hallucinations at home     BP (!) 148/90 (BP Location: Right Arm, Patient Position: Sitting, Cuff Size: Normal)   Pulse (!) 53   Temp 98.1 F (36.7 C) (Oral)   Resp 18   Ht '5\' 9"'$  (1.753 m)   Wt 135 lb 3.2 oz (61.3 kg)   SpO2 99%   BMI 19.97 kg/m  Wt Readings from Last 3 Encounters:  05/11/21 135 lb 3.2 oz (61.3 kg)  12/09/20 133 lb (60.3 kg)  12/08/20 132 lb 3.2 oz (60 kg)     Health Maintenance Due  Topic Date Due   Zoster Vaccines- Shingrix (1 of 2) Never done   PNA vac Low Risk Adult (1 of 2 - PCV13) 01/12/1999   TETANUS/TDAP  02/23/2015   INFLUENZA VACCINE  04/13/2021    There are no preventive care reminders to display for this patient.  Lab Results  Component Value Date   TSH 1.71 05/01/2019   Lab Results  Component Value Date   WBC 6.8 11/26/2020   HGB 12.6 (L) 11/26/2020   HCT 36.0 (L) 11/26/2020   MCV 90.2 11/26/2020   PLT 184 11/26/2020   Lab Results  Component Value Date   NA 138 11/26/2020   K 3.3 (L) 11/26/2020   CO2 23 11/26/2020   GLUCOSE 72 11/26/2020   BUN 15 11/26/2020   CREATININE 1.45 (H) 11/26/2020   BILITOT 1.8 (H) 11/26/2020   ALKPHOS 49 11/26/2020   AST 15 11/26/2020   ALT 8 11/26/2020   PROT 6.3 (L) 11/26/2020   ALBUMIN 3.0 (L) 11/26/2020   CALCIUM 8.7 (L) 11/26/2020   ANIONGAP 8 11/26/2020   GFR 43.90 (L) 11/04/2020   Lab  Results  Component Value Date   CHOL 158 11/26/2020   Lab Results  Component Value Date   HDL 41 11/26/2020   Lab Results  Component Value Date   LDLCALC 101 (H) 11/26/2020   Lab Results  Component Value Date   TRIG 79 11/26/2020   Lab Results  Component Value Date   CHOLHDL 3.9 11/26/2020   Lab Results  Component Value Date   HGBA1C 4.8 11/26/2020      Assessment & Plan:   Problem List Items Addressed This Visit       Unprioritized   BPH without obstruction/lower urinary tract symptoms    Check labs today       Coronary atherosclerosis of native coronary artery     Pt noncompliant with meds meds refilled today and discussed with his wife the importance of taking his medication      Relevant Medications   hydrALAZINE (APRESOLINE) 25 MG tablet   atorvastatin (LIPITOR) 40 MG tablet   Essential hypertension    Poorly controlled will alter medications, encouraged DASH diet, minimize caffeine and obtain adequate sleep. Report concerning symptoms and follow up as directed and as needed       Relevant Medications   hydrALAZINE (APRESOLINE) 25 MG tablet   atorvastatin (LIPITOR) 40 MG tablet   Hyperlipidemia LDL goal <100    Encourage heart healthy diet such as MIND or DASH diet, increase exercise, avoid trans fats, simple carbohydrates and processed foods, consider a krill or fish or flaxseed oil cap daily.  Restart statin      Relevant Medications   hydrALAZINE (APRESOLINE) 25 MG tablet   atorvastatin (LIPITOR) 40 MG tablet   Mild neurocognitive disorder due to Alzheimer's disease, possible    F/u neuro       Relevant Medications   sertraline (ZOLOFT) 25 MG tablet   Preventative health care - Primary    ghm utd Pneum 20 given Td/ shingrix from pharmacy Check labs       Relevant Orders   PSA   TSH   Lipid panel   CBC with Differential/Platelet   Comprehensive metabolic panel   Other Visit Diagnoses     Hyperlipidemia, unspecified hyperlipidemia type       Relevant Medications   hydrALAZINE (APRESOLINE) 25 MG tablet   atorvastatin (LIPITOR) 40 MG tablet   Other Relevant Orders   PSA   TSH   Lipid panel   CBC with Differential/Platelet   Comprehensive metabolic panel   Primary hypertension       Relevant Medications   hydrALAZINE (APRESOLINE) 25 MG tablet   atorvastatin (LIPITOR) 40 MG tablet   Other Relevant Orders   Lipid panel   CBC with Differential/Platelet   Comprehensive metabolic panel   Depression, unspecified depression type       Relevant Medications   sertraline (ZOLOFT) 25 MG tablet   Alzheimer's dementia  without behavioral disturbance, unspecified timing of dementia onset (HCC)       Relevant Medications   sertraline (ZOLOFT) 25 MG tablet   Other Relevant Orders   AMB Referral to [facility] Care Coordin[facility]   POCT Urinalysis Dipstick (Automated)   Need for pneumococcal vaccination       Relevant Orders   Pneumococcal conjugate vaccine 20-valent (Prevnar 20) (Completed)       Meds ordered this encounter  Medications   hydrALAZINE (APRESOLINE) 25 MG tablet    Sig: Take 1 tablet (25 mg total) by mouth every 8 (eight) hours.    Dispense:  90 tablet    Refill:  1   atorvastatin (LIPITOR) 40 MG tablet    Sig: Take 1 tablet (40 mg total) by mouth daily.    Dispense:  90 tablet    Refill:  1   sertraline (ZOLOFT) 25 MG tablet    Sig: Take 1 tablet (25 mg total) by mouth daily.    Dispense:  90 tablet    Refill:  1    Follow-up: Return in about 3 months (around 08/11/2021), or if symptoms worsen or fail to improve, for hypertension, hyperlipidemia.    [name], DO

## 2021-05-11 NOTE — Assessment & Plan Note (Signed)
ghm utd Pneum 20 given Td/ shingrix from pharmacy Check labs

## 2021-05-11 NOTE — Assessment & Plan Note (Signed)
Encourage heart healthy diet such as MIND or DASH diet, increase exercise, avoid trans fats, simple carbohydrates and processed foods, consider a krill or fish or flaxseed oil cap daily.  Restart statin

## 2021-05-11 NOTE — Assessment & Plan Note (Signed)
Poorly controlled will alter medications, encouraged DASH diet, minimize caffeine and obtain adequate sleep. Report concerning symptoms and follow up as directed and as needed 

## 2021-05-11 NOTE — Assessment & Plan Note (Signed)
F/u neuro  

## 2021-05-11 NOTE — Patient Instructions (Signed)
Preventive Care 85 Years and Older, Male Preventive care refers to lifestyle choices and visits with your health care provider that can promote health and wellness. This includes: A yearly physical exam. This is also called an annual wellness visit. Regular dental and eye exams. Immunizations. Screening for certain conditions. Healthy lifestyle choices, such as: Eating a healthy diet. Getting regular exercise. Not using drugs or products that contain nicotine and tobacco. Limiting alcohol use. What can I expect for my preventive care visit? Physical exam Your health care provider will check your: Height and weight. These may be used to calculate your BMI (body mass index). BMI is a measurement that tells if you are at a healthy weight. Heart rate and blood pressure. Body temperature. Skin for abnormal spots. Counseling Your health care provider may ask you questions about your: Past medical problems. Family's medical history. Alcohol, tobacco, and drug use. Emotional well-being. Home life and relationship well-being. Sexual activity. Diet, exercise, and sleep habits. History of falls. Memory and ability to understand (cognition). Work and work environment. Access to firearms. What immunizations do I need?  Vaccines are usually given at various ages, according to a schedule. Your health care provider will recommend vaccines for you based on your age, medicalhistory, and lifestyle or other factors, such as travel or where you work. What tests do I need? Blood tests Lipid and cholesterol levels. These may be checked every 5 years, or more often depending on your overall health. Hepatitis C test. Hepatitis B test. Screening Lung cancer screening. You may have this screening every year starting at age 55 if you have a 30-pack-year history of smoking and currently smoke or have quit within the past 15 years. Colorectal cancer screening. All adults should have this screening  starting at age 50 and continuing until age 75. Your health care provider may recommend screening at age 45 if you are at increased risk. You will have tests every 1-10 years, depending on your results and the type of screening test. Prostate cancer screening. Recommendations will vary depending on your family history and other risks. Genital exam to check for testicular cancer or hernias. Diabetes screening. This is done by checking your blood sugar (glucose) after you have not eaten for a while (fasting). You may have this done every 1-3 years. Abdominal aortic aneurysm (AAA) screening. You may need this if you are a current or former smoker. STD (sexually transmitted disease) testing, if you are at risk. Follow these instructions at home: Eating and drinking  Eat a diet that includes fresh fruits and vegetables, whole grains, lean protein, and low-fat dairy products. Limit your intake of foods with high amounts of sugar, saturated fats, and salt. Take vitamin and mineral supplements as recommended by your health care provider. Do not drink alcohol if your health care provider tells you not to drink. If you drink alcohol: Limit how much you have to 0-2 drinks a day. Be aware of how much alcohol is in your drink. In the [place] one drink equals one 12 oz bottle of beer (355 mL), one 5 oz glass of wine (148 mL), or one 1 oz glass of hard liquor (44 mL).  Lifestyle Take daily care of your teeth and gums. Brush your teeth every morning and night with fluoride toothpaste. Floss one time each day. Stay active. Exercise for at least 30 minutes 5 or more days each week. Do not use any products that contain nicotine or tobacco, such as cigarettes, e-cigarettes, and chewing tobacco.   If you need help quitting, ask your health care provider. Do not use drugs. If you are sexually active, practice safe sex. Use a condom or other form of protection to prevent STIs (sexually transmitted infections). Talk  with your health care provider about taking a low-dose aspirin or statin. Find healthy ways to cope with stress, such as: Meditation, yoga, or listening to music. Journaling. Talking to a trusted person. Spending time with friends and family. Safety Always wear your seat belt while driving or riding in a vehicle. Do not drive: If you have been drinking alcohol. Do not ride with someone who has been drinking. When you are tired or distracted. While texting. Wear a helmet and other protective equipment during sports activities. If you have firearms in your house, make sure you follow all gun safety procedures. What's next? Visit your health care provider once a year for an annual wellness visit. Ask your health care provider how often you should have your eyes and teeth checked. Stay up to date on all vaccines. This information is not intended to replace advice given to you by your health care provider. Make sure you discuss any questions you have with your healthcare provider. Document Revised: 05/29/2019 Document Reviewed: 08/24/2018 Elsevier Patient Education  2022 Elsevier Inc.  

## 2021-05-12 ENCOUNTER — Telehealth: Payer: Self-pay | Admitting: Family Medicine

## 2021-05-12 NOTE — Telephone Encounter (Signed)
Patient's wife called to see what she needs to do in order to get a power of attorney for her husband. Please Advise.

## 2021-05-12 NOTE — Telephone Encounter (Signed)
Spoke with pt's wife. She states the attorney needs a statement stating that [name] [name] is of sound mind to sign off on a POA. Once the letter is written we need to fax it to the attorney Renard Hamper at [phone]

## 2021-05-13 NOTE — Telephone Encounter (Signed)
Letter faxed.

## 2021-05-15 ENCOUNTER — Telehealth: Payer: Self-pay | Admitting: *Deleted

## 2021-05-15 NOTE — Chronic Care Management (AMB) (Signed)
  Chronic Care Management   Outreach Note  05/15/2021 Name: [name] [name] MRN: [id number] DOB: September 29, 1931  [name] [name] is a 87 y.o. year old male who is a primary care patient of [name], DO. I reached out to [employer] by phone today in response to a referral sent by [name] [name]'s PCP [name], DO     An unsuccessful telephone outreach was attempted today. The patient was referred to the case management team for assistance with care management and care coordination.   Follow Up Plan: A HIPAA compliant phone message was left for the patient providing contact information and requesting a return call.  If patient returns call to provider office, please advise to call Embedded Care Management Care Guide Yesenia Fontenette at [place], [place] [facility]  Direct Dial: [phone]

## 2021-05-15 NOTE — Chronic Care Management (AMB) (Signed)
  Chronic Care Management   Note  05/15/2021 Name: JOSEAN LYCAN MRN: [id number] DOB: 02/05/1932  GAVRIEL HOLZHAUER is a 87 y.o. year old male who is a primary care patient of [name], DO. I reached out to [employer] by phone today in response to a referral sent by Mr. Robyne Askew Daubert's PCP [name], DO     Mr. Thayer was given information about Chronic Care Management services today including:  CCM service includes personalized support from designated clinical staff supervised by his physician, including individualized plan of care and coordination with other care providers 24/7 contact phone numbers for assistance for urgent and routine care needs. Service will only be billed when office clinical staff spend 20 minutes or more in a month to coordinate care. Only one practitioner may furnish and bill the service in a calendar month. The patient may stop CCM services at any time (effective at the end of the month) by phone call to the office staff. The patient will be responsible for cost sharing (co-pay) of up to 20% of the service fee (after annual deductible is met).  Patient agreed to services and verbal consent obtained.   Follow up plan: Telephone appointment with care management team member scheduled for: 05/27/2021  Julian Hy, [place] [facility]  Direct Dial: [phone]

## 2021-05-25 ENCOUNTER — Other Ambulatory Visit (HOSPITAL_BASED_OUTPATIENT_CLINIC_OR_DEPARTMENT_OTHER): Payer: Self-pay

## 2021-05-27 ENCOUNTER — Ambulatory Visit (INDEPENDENT_AMBULATORY_CARE_PROVIDER_SITE_OTHER): Payer: Federal, State, Local not specified - PPO | Admitting: *Deleted

## 2021-05-27 DIAGNOSIS — I10 Essential (primary) hypertension: Secondary | ICD-10-CM

## 2021-05-27 DIAGNOSIS — G309 Alzheimer's disease, unspecified: Secondary | ICD-10-CM

## 2021-05-27 DIAGNOSIS — I2510 Atherosclerotic heart disease of native coronary artery without angina pectoris: Secondary | ICD-10-CM

## 2021-05-27 DIAGNOSIS — E785 Hyperlipidemia, unspecified: Secondary | ICD-10-CM

## 2021-05-27 DIAGNOSIS — N259 Disorder resulting from impaired renal tubular function, unspecified: Secondary | ICD-10-CM

## 2021-05-27 DIAGNOSIS — F0280 Dementia in other diseases classified elsewhere without behavioral disturbance: Secondary | ICD-10-CM

## 2021-05-27 DIAGNOSIS — F0670 Mild neurocognitive disorder due to known physiological condition without behavioral disturbance: Secondary | ICD-10-CM

## 2021-05-27 NOTE — Chronic Care Management (AMB) (Signed)
Chronic Care Management    Clinical Social Work Note  05/27/2021 Name: [name] [name] MRN: [id number] DOB: [birth year]  [name] [name] is a 87 y.o. year old male who is a primary care patient of [name], DO. The CCM team was consulted to assist the patient with chronic disease management and/or care coordination needs related to: [employer] .   Engaged with patient's wife by telephone for initial visit in response to provider referral for social work chronic care management and care coordination services.   Consent to Services:  The patient was given information about Chronic Care Management services, agreed to services, and gave verbal consent prior to initiation of services.  Please see initial visit note for detailed documentation.   Patient agreed to services and consent obtained.   Assessment: Review of patient past medical history, allergies, medications, and health status, including review of relevant consultants reports was performed today as part of a comprehensive evaluation and provision of chronic care management and care coordination services.     SDOH (Social Determinants of Health) assessments and interventions performed:  SDOH Interventions    Flowsheet Row Most Recent Value  SDOH Interventions   Food Insecurity Interventions Intervention Not Indicated, Other (Comment)  [Verified by Wife - [name] Sami]  Financial Strain Interventions Intervention Not Indicated, Other (Comment)  [Verified by Wife - [place] Interventions Intervention Not Indicated, Other (Comment)  [Verified by Wife - [name] Dilorenzo]  Intimate Partner Violence Interventions Intervention Not Indicated, Other (Comment)  [Verified by Wife - [name] Rumpf]  Physical Activity Interventions Intervention Not Indicated, Other (Comments)  [Verified by Wife - [name] Baumgardner]  Stress Interventions Intervention Not Indicated, Other (Comment)  [Verified by Wife - [name] Kreitz]   Social Connections Interventions Intervention Not Indicated, Other (Comment)  [Verified by Wife - [name] Barbaro]  Transportation Interventions Intervention Not Indicated, Other (Comment)  [Verified by Wife - [name] Moroney]        Advanced Directives Status: See Care Plan for related entries.  CCM Care Plan  Allergies  Allergen Reactions   Latanoprost Other (See Comments)    Eye irritation   Netarsudil Other (See Comments)    Eye burning and irritation    Outpatient Encounter Medications as of 05/27/2021  Medication Sig   atorvastatin (LIPITOR) 40 MG tablet Take 1 tablet (40 mg total) by mouth daily.   brimonidine (ALPHAGAN) 0.2 % ophthalmic solution Place 1 drop into both eyes in the morning and at bedtime.   cloNIDine (CATAPRES) 0.1 MG tablet Take 1 tablet (0.1 mg total) by mouth 2 (two) times daily.   dorzolamide-timolol (COSOPT) 22.3-6.8 MG/ML ophthalmic solution Place 1 drop into both eyes 2 (two) times daily.   hydrALAZINE (APRESOLINE) 25 MG tablet Take 1 tablet (25 mg total) by mouth every 8 (eight) hours.   metoprolol succinate (TOPROL-XL) 25 MG 24 hr tablet Take 0.5 tablets (12.5 mg total) by mouth daily.   sertraline (ZOLOFT) 25 MG tablet Take 1 tablet (25 mg total) by mouth daily.   No facility-administered encounter medications on file as of 05/27/2021.    Patient Active Problem List   Diagnosis Date Noted   Malignant hypertension 11/26/2020   Acute encephalopathy 11/25/2020   Mild neurocognitive disorder due to Alzheimer's disease, possible 09/26/2020   Lacunar infarct 08/20/2020   Preventative health care 04/01/2015   Bradycardia 04/21/2012   Flatulence, eructation, and gas pain 05/19/2010   Abdominal pain 05/19/2010   Coronary atherosclerosis of native coronary artery 04/07/2010  Elevated PSA 12/06/2009   Benign neoplasm of skin, site unspecified 11/24/2009   Abnormal EKG 04/10/2009   Weight loss observed 06/19/2008   Hip pain, left 04/29/2008   Disorder  resulting from impaired renal function 04/07/2007   Primary open-angle glaucoma, severe stage 12/29/2006   Hyperlipidemia LDL goal <100 12/08/2006   Hearing loss 12/08/2006   Essential hypertension 12/08/2006   BPH without obstruction/lower urinary tract symptoms 12/08/2006    Conditions to be addressed/monitored: Dementia.  Cognitive Deficits and Memory Deficits.  Follow-Up Plan:  No Follow-Up Required, Per Wife      Nat Christen LCSW Licensed Clinical Social Worker [place] [phone]

## 2021-06-10 ENCOUNTER — Ambulatory Visit: Payer: Federal, State, Local not specified - PPO | Admitting: Physician Assistant

## 2021-06-10 NOTE — Progress Notes (Signed)
Assessment/Plan:    MMSE today is 14/30= MoCA 8/30, with decreasing performance since his prior in August 2020 at 22/30.  Delayed recall was 0/3.  Neurocognitive testing in January 2022 indicating Mild Neurocognitive disorder, etiology concerning for Alzheimer's disease. Patient is not on any cognitive medications in view of his history of bradycardia.  Mood is not controlled, as he never started the sertraline 25 mg nightly that had been prescribed during the prior office visit.    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  Discussed the importance of regular daily schedule with inclusion of crossword puzzles to maintain brain function.  Continue to monitor mood .Start Sertraline 25 mg nightly Stay active at least 30 minutes at least 3 times a week.  Naps should be scheduled and should be no longer than 60 minutes and should not occur after 2 PM.  Follow up in 6 months.   Subjective:   ED visits since last seen: none  Hospital admissions: none  [name] [name] is a 87 y.o. male  right-handed man with a history of hypertension, hyperlipidemia, CAD, bradycardia, with Neurocognitive testing in January 2022 indicating Mild Neurocognitive disorder, etiology concerning for Alzheimer's disease, seen today in follow up for memory loss. This patient is accompanied in the office by his wife who supplements the history.  Previous records as well as any outside records available were reviewed prior to todays visit.  He is not on cognitive medications at this time.   He states that his mood in the morning is "okay", and wife adds that during the evening, it gets worse, although he sees people that they are not there, during the night, his behavior is more evident.  One time, she was sitting at the porch, the police came, and brought him home.  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  He has a history of depression.  He is not sleeping very well, he gets confused.  A few nights ago, he got into bed, and 30 minutes later he was trying to put his clothes on, and told her that he has to go to the doctor, she told him to go back to the bed.  A few minutes later, he got up again, and he told his wife that he is going to his house (that he lived in 1976", where his prior wife would take good care of him.  He has vivid dreams at times, denies sleepwalking.  "He loses everything ", but denies placing objects in unusual places.  His wife is in charge of the medications because he has not been taking them.  She also takes care of the finances.  Appetite is good, denies trouble swallowing.  His wife does the cooking.  He ambulates without difficulty without the use of a walker or a cane, he had a fall recently, when he hopped the fence as he was running to a neighbor house because he was feeling that a truck was following him.  During that fall, he did not hit his head, loss consciousness.  He no longer drives.  He denies any headaches, anosmia, he has  a history of glaucoma, but denies double vision, dizziness, focal numbness or tingling, unilateral great weakness or tremors, he denies any urine incontinence, retention constipation or diarrhea.   Initial evaluation on 12/09/2020, Dr. [name] this is an 87 year old right-handed man with a history of hypertension, hyperlipidemia, CAD, bradycardia, presenting for evaluation of Mild Neurocognitive disorder, etiology concerning for Alzheimer's disease. He is accompanied by his wife who helps supplement the history today. He underwent Neuropsychological evaluation in January 2022 with Dr. [name], results limited  due to visual impairment, however pattern of performance is suggestive of fairly diffuse cognitive impairment concerning for late-onset Alzheimer's disease. He largely denied difficulties completing ADLs, which his wife did not strongly contradict, meeting criteria for Mild Neurocognitive disorder, likely at severe end of this spectrum and at increased risk for conversion to Major Neurocognitive disorder.    He states his memory is "kind of terrible." His wife started noticing mood swings, but over the past year he has had progressively worsening memory where he would forget things, misplacing things frequently. He stopped driving in [id number] due to vision issues. His wife also took over writing his checks due to vision changes, however they still have separate accounts and his son is his POA. He states she manages some medications, he does some of them, and he sometimes forgets. He cooks sometimes, but his wife has noticed he has forgotten how to cook some foods, such a steak he made recently. He has not left the stove on. He is independent with dressing and bathing. His father had memory issues. He was in the [employer] where he had head injuries, no neurosurgical procedures. He denies any alcohol use. He reports his mood is terrible, he gets frustrated not being able to do things. His wife reports he does not remember where his money is, he gets frustrated and can't get it together when he counts it. His wife also reports visual hallucinations for the past 3-4 months, he sees people in the house, talking to little children. He denies any auditory component. He sleeps well at night, however sleeps during the day as well, taking naps when he sits down.   He was admitted to [facility] last 11/25/20 for acute encephalopathy. His wife reports they were in the car to Dr. [name] office when he suddenly got confused and could hardly get out of the car. When they got to the parking lot, he was walking around in circles.  His wife  had reported that he "fell asleep" and when they got to the office he was too weak to get out and walk on his own, needing a wheelchair. BP was 140/100. He was noted to have slurred speech, very lethargic, falling asleep on the wheelchair. He was transferred to [facility], BP was 207/87, HR as low as the 30s at some point during his stay, at that point conversing appropriately. I personally reviewed MRI brain without contrast which did not show any acute changes. There was moderate diffuse atrophy and mild chronic microvascular disease. CTA showed calcified plaque within the proximal left ICA with estimated 70% stenosis, however this could be exaggerated by blooming artifact from calcified plaque, right ICA less than 50% stenosis. His wife notes he was still confused and kept thinking that he was in a shack and not in the hospital (note of in-hospital delirium). EEG was normal. He denies any headaches, dizziness, diplopia, dysarthria/dysphagia, neck/back pain, focal numbness/tingling/weakness, bowel/bladder dysfunction, anosmia, or tremors.   Lab Results  Component Value Date  TSH 1.71 05/01/2019      Recent Labs[] Expand by Default       Lab Results  Component Value Date    [id number] 096 05/01/2019       PREVIOUS MEDICATIONS:   CURRENT MEDICATIONS:  Outpatient Encounter Medications as of 06/11/2021  Medication Sig   atorvastatin (LIPITOR) 40 MG tablet Take 1 tablet (40 mg total) by mouth daily.   brimonidine (ALPHAGAN) 0.2 % ophthalmic solution Place 1 drop into both eyes in the morning and at bedtime.   cloNIDine (CATAPRES) 0.1 MG tablet Take 1 tablet (0.1 mg total) by mouth 2 (two) times daily.   dorzolamide-timolol (COSOPT) 22.3-6.8 MG/ML ophthalmic solution Place 1 drop into both eyes 2 (two) times daily.   hydrALAZINE (APRESOLINE) 25 MG tablet Take 1 tablet (25 mg total) by mouth every 8 (eight) hours.   metoprolol succinate (TOPROL-XL) 25 MG 24 hr tablet Take 0.5 tablets (12.5 mg  total) by mouth daily.   sertraline (ZOLOFT) 25 MG tablet Take 1 tablet (25 mg total) by mouth daily.   No facility-administered encounter medications on file as of 06/11/2021.     Objective:     PHYSICAL EXAMINATION:    VITALS:   Vitals:   06/11/21 0847  BP: (!) 226/111  Pulse: (!) 58  Resp: 20  SpO2: 98%  Weight: 134 lb (60.8 kg)  Height: 5\' 8"  (1.727 m)    GEN:  The patient appears stated age and is in NAD. HEENT:  Normocephalic, atraumatic.   Neurological examination:  General: NAD, well-groomed, appears stated age. Orientation: The patient is alert. Oriented to person, place and not to date, is 1985, April, it is the spring.  He does note that he is in [facility]. Cranial nerves: There is good facial symmetry.The speech is fluent and clear. No aphasia or dysarthria. Fund of knowledge is appropriate. Recent and remote memory are impaired. Attention and concentration are reduced.  Able to name objects and repeat phrases.  Hearing is intact to conversational tone.    Sensation: Sensation is intact to light touch throughout Motor: Strength is at least antigravity x4. Tremors: none  DTR's 2/4 in UE/LE    No flowsheet data found. MMSE - Mini Mental State Exam 06/11/2021 05/01/2019 08/21/2018  Orientation to time 1 2 5   Orientation to Place 3 5 5   Registration 3 3 3   Attention/ Calculation 2 5 4   Recall 0 1 1  Language- name 2 objects 2 2 2   Language- repeat 1 1 1   Language- follow 3 step command 1 3 1   Language- read & follow direction 1 0 1  Write a sentence 0 0 0  Copy design 0 0 0  Total score 14 22 23     No flowsheet data found.     Movement examination: Tone: There is normal tone in the UE/LE Abnormal movements:  no tremor.  No myoclonus.  No asterixis.   Coordination:  There is no decremation with RAM's. Normal finger to nose  Gait and Station: The patient has no difficulty arising out of a deep-seated chair without the use of  the hands. The patient's stride length is good.  Gait is cautious and narrow.        Total time spent on today's visit was  30 minutes, including both face-to-face time and nonface-to-face time. Time included that spent on review of records (prior notes available to me/labs/imaging if pertinent), discussing treatment and goals, answering patient's questions and coordinating care.  Cc:  Carollee Herter, Alferd Apa, DO Sharene Butters, PA-C

## 2021-06-11 ENCOUNTER — Ambulatory Visit: Payer: Federal, State, Local not specified - PPO | Admitting: Physician Assistant

## 2021-06-11 ENCOUNTER — Encounter: Payer: Self-pay | Admitting: Physician Assistant

## 2021-06-11 ENCOUNTER — Other Ambulatory Visit: Payer: Self-pay

## 2021-06-11 VITALS — BP 226/111 | HR 58 | Resp 20 | Ht 68.0 in | Wt 134.0 lb

## 2021-06-11 DIAGNOSIS — F0281 Dementia in other diseases classified elsewhere with behavioral disturbance: Secondary | ICD-10-CM

## 2021-06-11 DIAGNOSIS — G309 Alzheimer's disease, unspecified: Secondary | ICD-10-CM

## 2021-06-11 NOTE — Patient Instructions (Addendum)
It was a pleasure to see you today at our office.   Recommendations:  Start the Sertraline 25 mg at night  No naps after 2 pm  Follow up in 6 months  Look into sitters to take care of him when you are not home, he needs close monitoring   RECOMMENDATIONS FOR ALL PATIENTS WITH MEMORY PROBLEMS: 1. Continue to exercise (Recommend 30 minutes of walking everyday, or 3 hours every week) 2. Increase social interactions - continue going to [place] and enjoy social gatherings with friends and family 3. Eat healthy, avoid fried foods and eat more fruits and vegetables 4. Maintain adequate blood pressure, blood sugar, and blood cholesterol level. Reducing the risk of stroke and cardiovascular disease also helps promoting better memory. 5. Avoid stressful situations. Live a simple life and avoid aggravations. Organize your time and prepare for the next day in anticipation. 6. Sleep well, avoid any interruptions of sleep and avoid any distractions in the bedroom that may interfere with adequate sleep quality 7. Avoid sugar, avoid sweets as there is a strong link between excessive sugar intake, diabetes, and cognitive impairment We discussed the Mediterranean diet, which has been shown to help patients reduce the risk of progressive memory disorders and reduces cardiovascular risk. This includes eating fish, eat fruits and green leafy vegetables, nuts like almonds and hazelnuts, walnuts, and also use olive oil. Avoid fast foods and fried foods as much as possible. Avoid sweets and sugar as sugar use has been linked to worsening of memory function.  There is always a concern of gradual progression of memory problems. If this is the case, then we may need to adjust level of care according to patient needs. Support, both to the patient and caregiver, should then be put into place.     FALL PRECAUTIONS: Be cautious when walking. Scan the area for obstacles that may increase the risk of trips and falls. When  getting up in the mornings, sit up at the edge of the bed for a few minutes before getting out of bed. Consider elevating the bed at the head end to avoid drop of blood pressure when getting up. Walk always in a well-lit room (use night lights in the walls). Avoid area rugs or power cords from appliances in the middle of the walkways. Use a walker or a cane if necessary and consider physical therapy for balance exercise. Get your eyesight checked regularly.  FINANCIAL OVERSIGHT: Supervision, especially oversight when making financial decisions or transactions is also recommended.  HOME SAFETY: Consider the safety of the kitchen when operating appliances like stoves, microwave oven, and blender. Consider having supervision and share cooking responsibilities until no longer able to participate in those. Accidents with firearms and other hazards in the house should be identified and addressed as well.   ABILITY TO BE LEFT ALONE: If patient is unable to contact 911 operator, consider using LifeLine, or when the need is there, arrange for someone to stay with patients. Smoking is a fire hazard, consider supervision or cessation. Risk of wandering should be assessed by caregiver and if detected at any point, supervision and safe proof recommendations should be instituted.  MEDICATION SUPERVISION: Inability to self-administer medication needs to be constantly addressed. Implement a mechanism to ensure safe administration of the medications.   DRIVING: Regarding driving, in patients with progressive memory problems, driving will be impaired. We advise to have someone else do the driving if trouble finding directions or if minor accidents are reported. Independent driving assessment  is available to determine safety of driving.      Mediterranean Diet A Mediterranean diet refers to food and lifestyle choices that are based on the traditions of countries located on the [employer]. This way of eating  has been shown to help prevent certain conditions and improve outcomes for people who have chronic diseases, like kidney disease and heart disease. What are tips for following this plan? Lifestyle  Cook and eat meals together with your family, when possible. Drink enough fluid to keep your urine clear or pale yellow. Be physically active every day. This includes: Aerobic exercise like running or swimming. Leisure activities like gardening, walking, or housework. Get 7-8 hours of sleep each night. If recommended by your health care provider, drink red wine in moderation. This means 1 glass a day for nonpregnant women and 2 glasses a day for men. A glass of wine equals 5 oz (150 mL). Reading food labels  Check the serving size of packaged foods. For foods such as rice and pasta, the serving size refers to the amount of cooked product, not dry. Check the total fat in packaged foods. Avoid foods that have saturated fat or trans fats. Check the ingredients list for added sugars, such as corn syrup. Shopping  At the grocery store, buy most of your food from the areas near the walls of the store. This includes: Fresh fruits and vegetables (produce). Grains, beans, nuts, and seeds. Some of these may be available in unpackaged forms or large amounts (in bulk). Fresh seafood. Poultry and eggs. Low-fat dairy products. Buy whole ingredients instead of prepackaged foods. Buy fresh fruits and vegetables in-season from local farmers markets. Buy frozen fruits and vegetables in resealable bags. If you do not have access to quality fresh seafood, buy precooked frozen shrimp or canned fish, such as tuna, salmon, or sardines. Buy small amounts of raw or cooked vegetables, salads, or olives from the deli or salad bar at your store. Stock your pantry so you always have certain foods on hand, such as olive oil, canned tuna, canned tomatoes, rice, pasta, and beans. Cooking  Cook foods with extra-virgin olive  oil instead of using butter or other vegetable oils. Have meat as a side dish, and have vegetables or grains as your main dish. This means having meat in small portions or adding small amounts of meat to foods like pasta or stew. Use beans or vegetables instead of meat in common dishes like chili or lasagna. Experiment with different cooking methods. Try roasting or broiling vegetables instead of steaming or sauteing them. Add frozen vegetables to soups, stews, pasta, or rice. Add nuts or seeds for added healthy fat at each meal. You can add these to yogurt, salads, or vegetable dishes. Marinate fish or vegetables using olive oil, lemon juice, garlic, and fresh herbs. Meal planning  Plan to eat 1 vegetarian meal one day each week. Try to work up to 2 vegetarian meals, if possible. Eat seafood 2 or more times a week. Have healthy snacks readily available, such as: Vegetable sticks with hummus. Greek yogurt. Fruit and nut trail mix. Eat balanced meals throughout the week. This includes: Fruit: 2-3 servings a day Vegetables: 4-5 servings a day Low-fat dairy: 2 servings a day Fish, poultry, or lean meat: 1 serving a day Beans and legumes: 2 or more servings a week Nuts and seeds: 1-2 servings a day Whole grains: 6-8 servings a day Extra-virgin olive oil: 3-4 servings a day Limit red meat and sweets  to only a few servings a month What are my food choices? Mediterranean diet Recommended Grains: Whole-grain pasta. Brown rice. Bulgar wheat. Polenta. Couscous. Whole-wheat bread. [place]. Vegetables: Artichokes. Beets. Broccoli. Cabbage. Carrots. Eggplant. Green beans. Chard. Kale. Spinach. Onions. Leeks. Peas. Squash. Tomatoes. Peppers. Radishes. Fruits: Apples. Apricots. Avocado. Berries. Bananas. Cherries. Dates. Figs. Grapes. Lemons. Melon. Oranges. Peaches. Plums. Pomegranate. Meats and other protein foods: Beans. Almonds. Sunflower seeds. Pine nuts. Peanuts. [place]. Salmon. Scallops.  Shrimp. [place]. Tilapia. Clams. Oysters. Eggs. Dairy: Low-fat milk. Cheese. Greek yogurt. Beverages: Water. Red wine. Herbal tea. Fats and oils: Extra virgin olive oil. Avocado oil. Grape seed oil. Sweets and desserts: [place] yogurt with honey. Baked apples. Poached pears. Trail mix. Seasoning and other foods: Basil. Cilantro. Coriander. Cumin. Mint. Parsley. Sage. Rosemary. Tarragon. Garlic. Oregano. Thyme. Pepper. Balsalmic vinegar. Tahini. Hummus. Tomato sauce. Olives. Mushrooms. Limit these Grains: Prepackaged pasta or rice dishes. Prepackaged cereal with added sugar. Vegetables: Deep fried potatoes (french fries). Fruits: Fruit canned in syrup. Meats and other protein foods: Beef. Pork. Lamb. Poultry with skin. Hot dogs. [place] Salines. Dairy: Ice cream. Sour cream. Whole milk. Beverages: Juice. Sugar-sweetened soft drinks. Beer. Liquor and spirits. Fats and oils: Butter. Canola oil. Vegetable oil. Beef fat (tallow). Lard. Sweets and desserts: Cookies. Cakes. Pies. Candy. Seasoning and other foods: Mayonnaise. Premade sauces and marinades. The items listed may not be a complete list. Talk with your dietitian about what dietary choices are right for you. Summary The Mediterranean diet includes both food and lifestyle choices. Eat a variety of fresh fruits and vegetables, beans, nuts, seeds, and whole grains. Limit the amount of red meat and sweets that you eat. Talk with your health care provider about whether it is safe for you to drink red wine in moderation. This means 1 glass a day for nonpregnant women and 2 glasses a day for men. A glass of wine equals 5 oz (150 mL). This information is not intended to replace advice given to you by your health care provider. Make sure you discuss any questions you have with your health care provider. Document Released: 04/22/2016 Document Revised: 05/25/2016 Document Reviewed: 04/22/2016 Elsevier Interactive Patient Education  2017 [employer].

## 2021-06-12 DIAGNOSIS — E785 Hyperlipidemia, unspecified: Secondary | ICD-10-CM | POA: Diagnosis not present

## 2021-06-12 DIAGNOSIS — G3184 Mild cognitive impairment, so stated: Secondary | ICD-10-CM

## 2021-06-12 DIAGNOSIS — F0280 Dementia in other diseases classified elsewhere without behavioral disturbance: Secondary | ICD-10-CM

## 2021-06-12 DIAGNOSIS — N259 Disorder resulting from impaired renal tubular function, unspecified: Secondary | ICD-10-CM

## 2021-06-12 DIAGNOSIS — I10 Essential (primary) hypertension: Secondary | ICD-10-CM | POA: Diagnosis not present

## 2021-06-12 DIAGNOSIS — I2510 Atherosclerotic heart disease of native coronary artery without angina pectoris: Secondary | ICD-10-CM

## 2021-06-12 DIAGNOSIS — G309 Alzheimer's disease, unspecified: Secondary | ICD-10-CM

## 2021-07-14 ENCOUNTER — Telehealth: Payer: Self-pay | Admitting: Family Medicine

## 2021-07-14 NOTE — Telephone Encounter (Signed)
Medication:  cloNIDine (CATAPRES) 0.1 MG tablet    Has the patient contacted their pharmacy? Yes.   (If no, request that the patient contact the pharmacy for the refill.) (If yes, when and what did the pharmacy advise?) Pt spouse was advised prescription refill request was sent over to pcp.      Preferred Pharmacy (with phone number or street name):  [place], [place] - [place]  [address] [place] [address]  Phone:  [phone]  Fax:  [phone]     Agent: Please be advised that RX refills may take up to 3 business days. We ask that you follow-up with your pharmacy.

## 2021-07-15 NOTE — Telephone Encounter (Signed)
Last refilled by Renne Crigler in March. Last refilled by you in early 2021. Please advise

## 2021-07-16 ENCOUNTER — Other Ambulatory Visit: Payer: Self-pay | Admitting: Family Medicine

## 2021-07-16 DIAGNOSIS — I10 Essential (primary) hypertension: Secondary | ICD-10-CM

## 2021-07-16 MED ORDER — CLONIDINE HCL 0.1 MG PO TABS: 0.1000 mg | ORAL_TABLET | Freq: Two times a day (BID) | ORAL | 1 refills | Status: DC

## 2021-07-17 ENCOUNTER — Ambulatory Visit: Payer: Federal, State, Local not specified - PPO | Attending: Internal Medicine

## 2021-07-17 DIAGNOSIS — Z23 Encounter for immunization: Secondary | ICD-10-CM

## 2021-07-17 NOTE — Progress Notes (Signed)
   Covid-19 Vaccination Clinic  Name:  [name] [name]    MRN: [id number] DOB: [birth year]  07/17/2021  Mr. [name] was observed post Covid-19 immunization for 15 minutes without incident. He was provided with Vaccine Information Sheet and instruction to access the V-Safe system.   Mr. [name] was instructed to call 911 with any severe reactions post vaccine: Difficulty breathing  Swelling of face and throat  A fast heartbeat  A bad rash all over body  Dizziness and weakness   Immunizations Administered     Name Date Dose VIS Date Route   Pfizer Covid-19 Vaccine Bivalent Booster 07/17/2021  9:29 AM 0.3 mL 05/13/2021 Intramuscular   Manufacturer: [place]   Lot: [id number]   [place]: [phone]

## 2021-08-04 ENCOUNTER — Other Ambulatory Visit (HOSPITAL_BASED_OUTPATIENT_CLINIC_OR_DEPARTMENT_OTHER): Payer: Self-pay

## 2021-08-04 MED ORDER — PFIZER COVID-19 VAC BIVALENT 30 MCG/0.3ML IM SUSP
INTRAMUSCULAR | 0 refills | Status: DC
  Filled 2021-08-04: qty 0.3, 1d supply, fill #0

## 2021-08-11 IMAGING — MR MR HEAD W/O CM
9 of 13 series · 30 of 48 positions shown · non-contrast
Comparison: Prior CTs from 11/25/2020

CLINICAL DATA: Initial evaluation for neuro deficit, stroke
suspected.

EXAM:
MRI HEAD WITHOUT CONTRAST
TECHNIQUE: Multiplanar, multiecho pulse sequences of the brain and surrounding
structures were obtained without intravenous contrast.

[Series 2: DWI · axial · 3.0mm · 0.94mm/px · z∈[-80,+67]mm · 6 of 100 slices shown (1 of 4)]
[im 1/100]
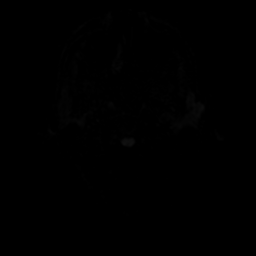
[im 20/100]
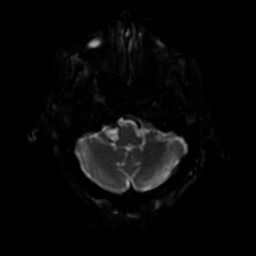
[im 40/100]
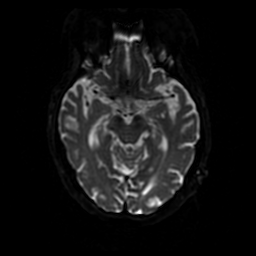
[im 60/100]
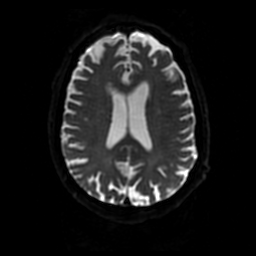
[im 80/100]
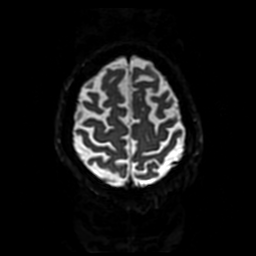
[im 100/100]
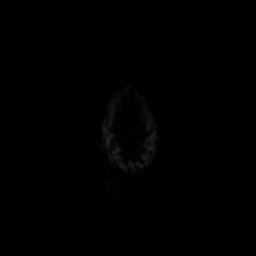

[Series 3: DWI · coronal · 4.0mm · 0.94mm/px · 5 of 74 slices shown (2 of 4)]
[im 1/74]
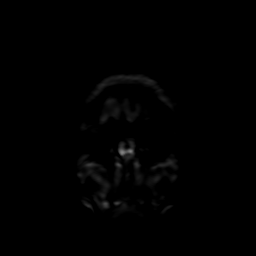
[im 19/74]
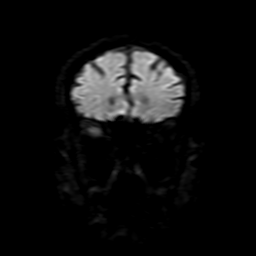
[im 37/74]
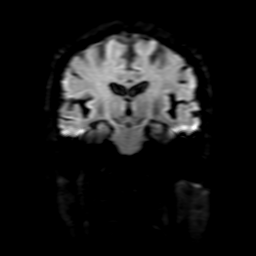
[im 55/74]
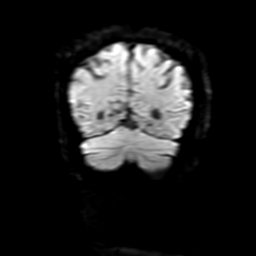
[im 74/74]
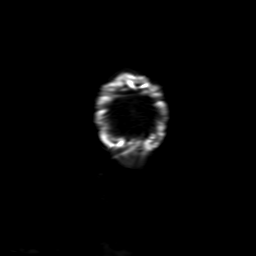

[Series 4: FLAIR · sagittal · 5.0mm · 0.23mm/px · 1 of 23 slices shown (1 of 2)]
[im 1/23]
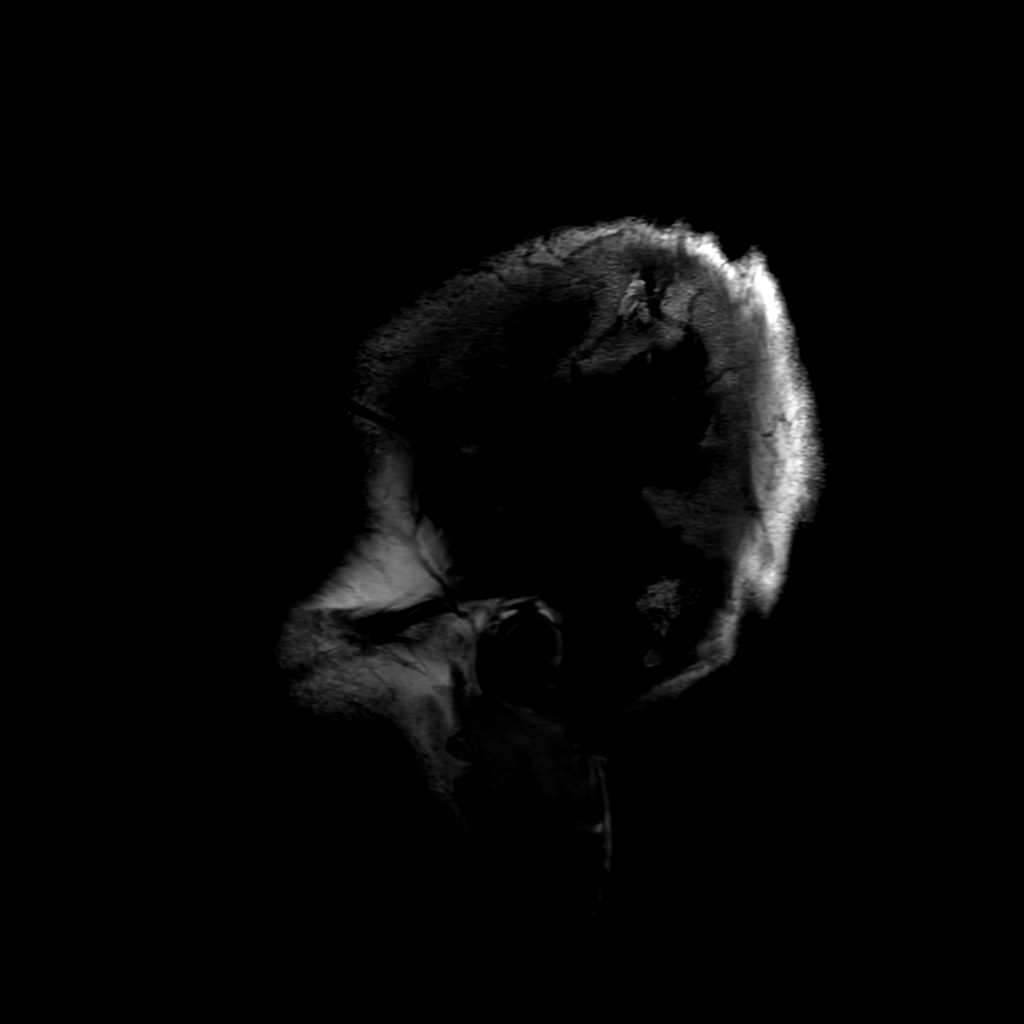

[Series 5: T2 · axial · 5.0mm · 0.23mm/px · 1 of 25 slices shown]
[im 1/25]
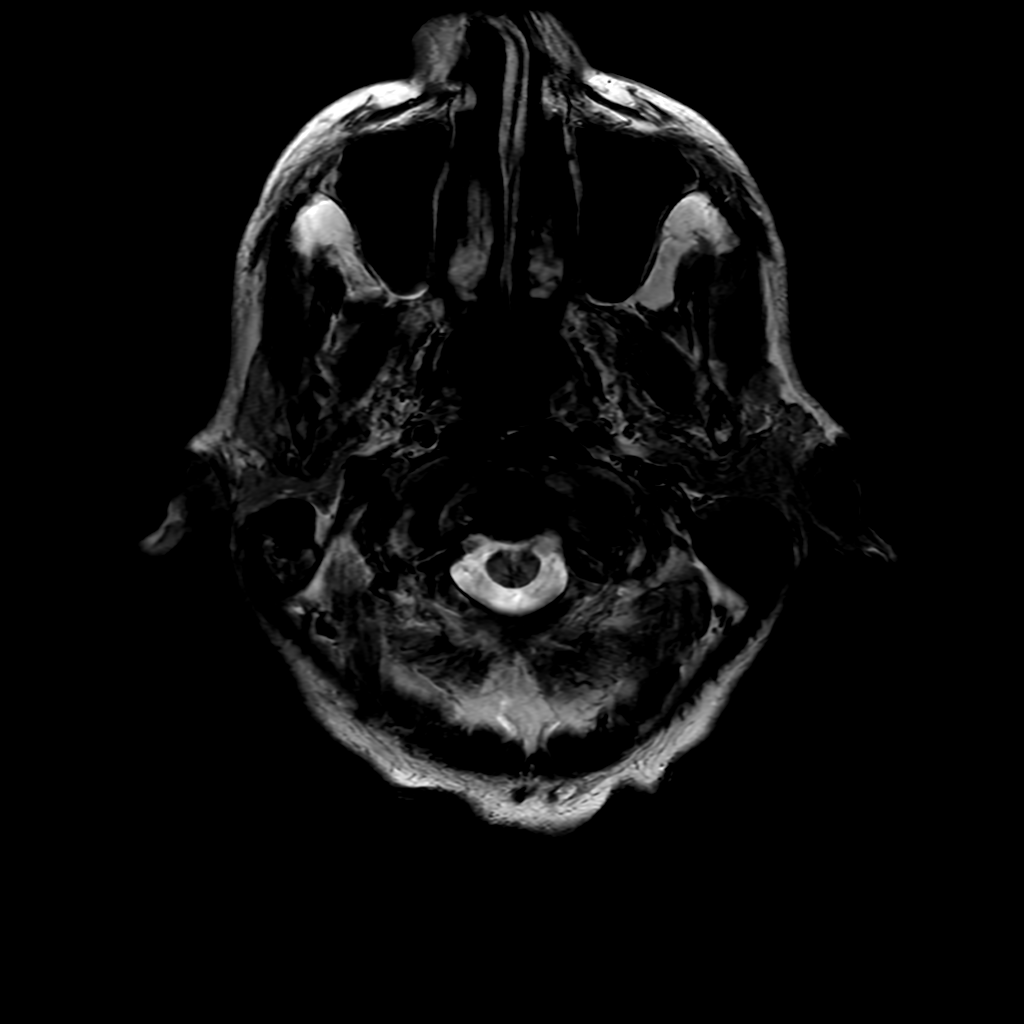

[Series 6: FLAIR · axial · 3.0mm · 0.45mm/px · z∈[-78,+65]mm · 2 of 25 slices shown (2 of 2)]
[im 1/25]
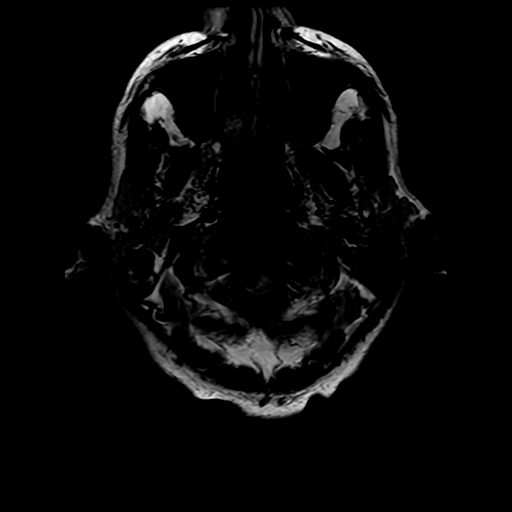
[im 25/25]
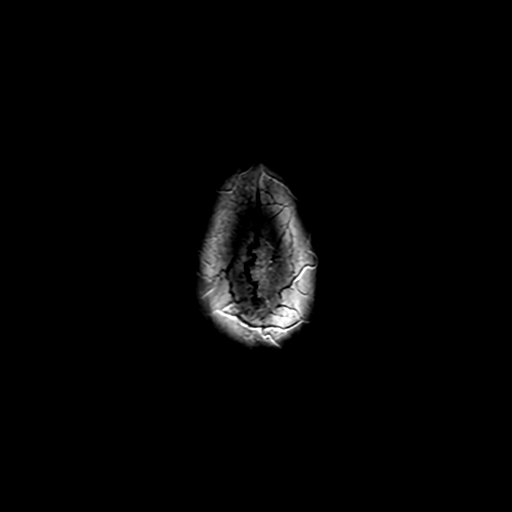

[Series 210: DWI · axial · 3.0mm · 0.94mm/px · z∈[-80,+67]mm · 6 of 100 slices shown (3 of 4)]
[im 1/100]
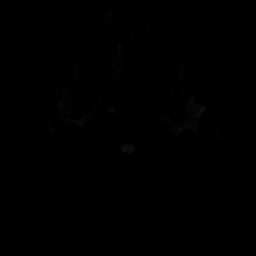
[im 20/100]
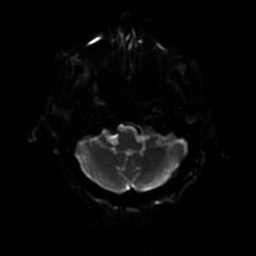
[im 40/100]
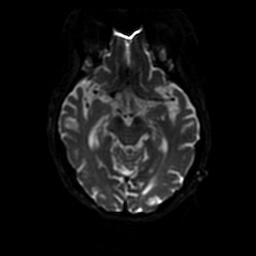
[im 60/100]
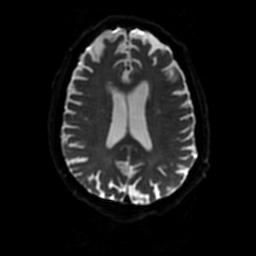
[im 80/100]
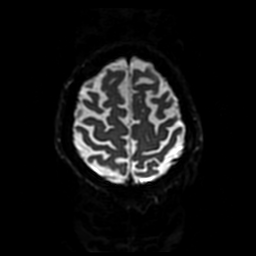
[im 100/100]
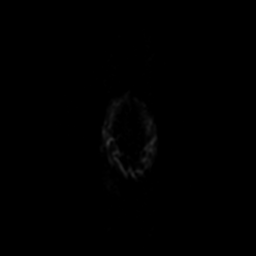

[Series 250: ADC · axial · 3.0mm · 0.94mm/px · z∈[-80,+67]mm · 3 of 50 slices shown (1 of 2)]
[im 1/50]
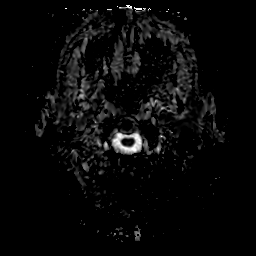
[im 25/50]
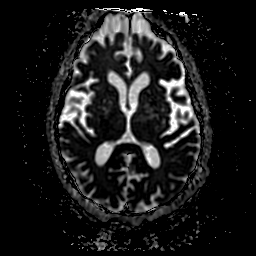
[im 50/50]
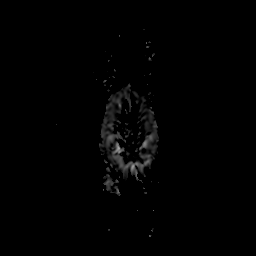

[Series 310: DWI · coronal · 4.0mm · 0.94mm/px · 4 of 73 slices shown (4 of 4)]
[im 1/73]
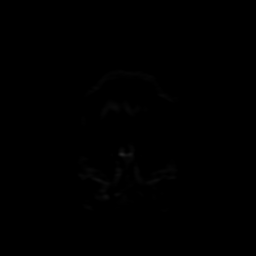
[im 25/73]
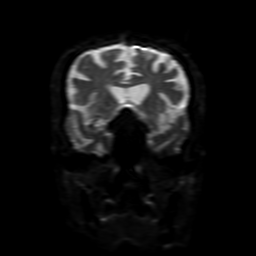
[im 49/73]
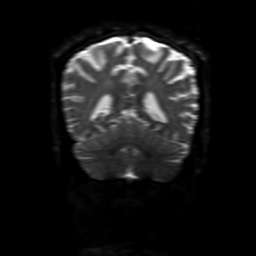
[im 73/73]
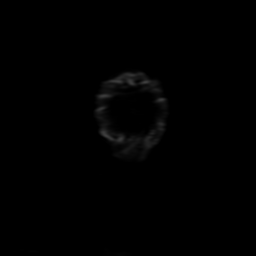

[Series 350: ADC · coronal · 4.0mm · 0.94mm/px · 2 of 37 slices shown (2 of 2)]
[im 1/37]
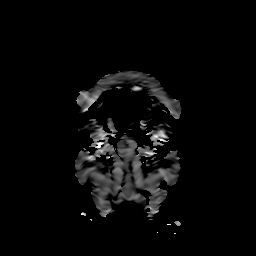
[im 37/37]
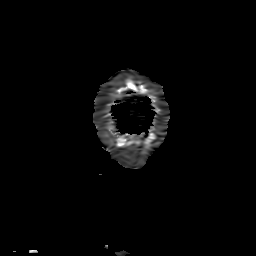

[30 of 48 positions shown; findings below may reference images not displayed]

FINDINGS: Brain: Generalized age-related cerebral atrophy with mild chronic
small vessel ischemic disease. No abnormal foci of restricted
diffusion to suggest acute or subacute ischemia. Gray-white matter
differentiation maintained. No encephalomalacia to suggest chronic
cortical infarction. No evidence for acute or chronic intracranial
hemorrhage.

No mass lesion, midline shift or mass effect. No hydrocephalus or
extra-axial fluid collection. Note made of a partially empty sella.
Midline structures intact.

Vascular: Major intracranial vascular flow voids are maintained.

Skull and upper cervical spine: Craniocervical junction within
normal limits. Bone marrow signal intensity normal. No scalp soft
tissue abnormality.

Sinuses/Orbits: Patient status post bilateral ocular lens
replacement. Globes and orbital soft tissues demonstrate no acute
finding. Paranasal sinuses are largely clear. No mastoid effusion.
Inner ear structures grossly normal.

Other: None.
IMPRESSION: 1. No acute intracranial abnormality.
2. Generalized age-related cerebral atrophy with mild chronic small
vessel ischemic disease.

## 2021-08-17 ENCOUNTER — Ambulatory Visit: Payer: Federal, State, Local not specified - PPO | Admitting: Family Medicine

## 2021-08-17 ENCOUNTER — Encounter: Payer: Self-pay | Admitting: Family Medicine

## 2021-08-17 VITALS — BP 118/72 | HR 57 | Temp 98.6°F | Resp 16 | Ht 68.0 in | Wt 125.6 lb

## 2021-08-17 DIAGNOSIS — I10 Essential (primary) hypertension: Secondary | ICD-10-CM

## 2021-08-17 DIAGNOSIS — E785 Hyperlipidemia, unspecified: Secondary | ICD-10-CM

## 2021-08-17 LAB — CBC WITH DIFFERENTIAL/PLATELET
Basophils Absolute: 0 10*3/uL (ref 0.0–0.1)
Basophils Relative: 0.2 % (ref 0.0–3.0)
Eosinophils Absolute: 0 10*3/uL (ref 0.0–0.7)
Eosinophils Relative: 0.1 % (ref 0.0–5.0)
HCT: 34.5 % — ABNORMAL LOW (ref 39.0–52.0)
Hemoglobin: 11.3 g/dL — ABNORMAL LOW (ref 13.0–17.0)
Lymphocytes Relative: 4.7 % — ABNORMAL LOW (ref 12.0–46.0)
Lymphs Abs: 0.6 10*3/uL — ABNORMAL LOW (ref 0.7–4.0)
MCHC: 32.8 g/dL (ref 30.0–36.0)
MCV: 94 fl (ref 78.0–100.0)
Monocytes Absolute: 0.7 10*3/uL (ref 0.1–1.0)
Monocytes Relative: 5.5 % (ref 3.0–12.0)
Neutro Abs: 11.8 10*3/uL — ABNORMAL HIGH (ref 1.4–7.7)
Neutrophils Relative %: 89.5 % — ABNORMAL HIGH (ref 43.0–77.0)
Platelets: 163 10*3/uL (ref 150.0–400.0)
RBC: 3.67 Mil/uL — ABNORMAL LOW (ref 4.22–5.81)
RDW: 13.6 % (ref 11.5–15.5)
WBC: 13.1 10*3/uL — ABNORMAL HIGH (ref 4.0–10.5)

## 2021-08-17 LAB — COMPREHENSIVE METABOLIC PANEL
ALT: 11 U/L (ref 0–53)
AST: 15 U/L (ref 0–37)
Albumin: 3.4 g/dL — ABNORMAL LOW (ref 3.5–5.2)
Alkaline Phosphatase: 45 U/L (ref 39–117)
BUN: 28 mg/dL — ABNORMAL HIGH (ref 6–23)
CO2: 22 mEq/L (ref 19–32)
Calcium: 8.5 mg/dL (ref 8.4–10.5)
Chloride: 106 mEq/L (ref 96–112)
Creatinine, Ser: 1.87 mg/dL — ABNORMAL HIGH (ref 0.40–1.50)
GFR: 31.91 mL/min — ABNORMAL LOW (ref >60.00)
Glucose, Bld: 147 mg/dL — ABNORMAL HIGH (ref 70–99)
Potassium: 3.6 mEq/L (ref 3.5–5.1)
Sodium: 137 mEq/L (ref 135–145)
Total Bilirubin: 2 mg/dL — ABNORMAL HIGH (ref 0.2–1.2)
Total Protein: 6.3 g/dL (ref 6.0–8.3)

## 2021-08-17 LAB — LIPID PANEL
Cholesterol: 133 mg/dL (ref 0–200)
HDL: 41.5 mg/dL (ref >39.00)
LDL Cholesterol: 76 mg/dL (ref 0–99)
NonHDL: 91.05
Total CHOL/HDL Ratio: 3
Triglycerides: 74 mg/dL (ref 0.0–149.0)
VLDL: 14.8 mg/dL (ref 0.0–40.0)

## 2021-08-17 NOTE — Progress Notes (Signed)
Established Patient Office Visit  Subjective:  Patient ID: [name] [name], male    DOB: 14-Apr-1932  Age: 87 y.o. MRN: [id number]  CC:  Chief Complaint  Patient presents with   Hypertension   Hyperlipidemia   Follow-up    HPI [name] [name] presents for f/u chol and bp.   His wife is with him.  He is seeing neuro for memory loss.   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; chronic left thalamic   Mild neurocognitive disorder due to Alzheimer's disease, possible 09/26/2020   Primary open-angle glaucoma, severe stage 12/29/2006   Renal insufficiency    Weight loss observed 06/19/2008    Past Surgical History:  Procedure Laterality Date   BLADDER SURGERY     CATARACT EXTRACTION     CHOLECYSTECTOMY  03/2010   [place] [facility]   COLONOSCOPY     removal of cyst     right arm   SHOULDER SURGERY     cyst removed left shoulder   TONSILECTOMY, ADENOIDECTOMY, BILATERAL MYRINGOTOMY AND TUBES     TRANSURETHRAL RESECTION OF PROSTATE      Family History  Problem Relation Age of Onset   Coronary artery disease Mother    Hypertension Mother    Heart disease Mother    Diabetes Sister    Heart disease Sister 71       died during cabg   Hypertension Daughter    Hypertension Son    Alzheimer's disease Father     Social History   Socioeconomic History   Marital status: Married    Spouse name: Not on file   Number of children:  Not on file   Years of education: 14   Highest education level: Some college, no degree  Occupational History   Occupation: retired Actor  Tobacco Use   Smoking status: Former    Packs/day: 0.30    Years: 20.00    Pack years: 6.00    Types: Cigarettes    Quit date: 02/22/1993    Years since quitting: 28.5   Smokeless tobacco: Never  Vaping Use   Vaping Use: Never used  Substance and Sexual Activity   Alcohol use: No   Drug use: Not Currently    Types: Marijuana    Comment: past marijuana use; none currently   Sexual activity: Not Currently    Partners: Female  Other Topics Concern   Not on file  Social History Narrative   Exercise--- stair step,  Sit ups, pull ups and bike   Lives with wife   Right handed    Social Determinants of Health   Financial Resource Strain: Low Risk    Difficulty of Paying Living Expenses: Not hard at all  Food Insecurity: No Food Insecurity   Worried About Charity fundraiser in the Last Year: Never true   Ran Out of Food in the Last Year: Never true  Transportation Needs: No  Transportation Needs   Lack of Transportation (Medical): No   Lack of Transportation (Non-Medical): No  Physical Activity: Inactive   Days of Exercise per Week: 0 days   Minutes of Exercise per Session: 0 min  Stress: No Stress Concern Present   Feeling of Stress : Not at all  Social Connections: Moderately Integrated   Frequency of Communication with Friends and Family: More than three times a week   Frequency of Social Gatherings with Friends and Family: More than three times a week   Attends Religious Services: 1 to 4 times per year   Active Member of [employer] or Organizations: No   Attends Archivist Meetings: Never   Marital Status: Married  Human resources officer Violence: Not At Risk   Fear of Current or Ex-Partner: No   Emotionally Abused: No   Physically Abused: No   Sexually Abused: No    Outpatient Medications Prior to Visit  Medication  Sig Dispense Refill   brimonidine (ALPHAGAN) 0.2 % ophthalmic solution Place 1 drop into both eyes in the morning and at bedtime.     cloNIDine (CATAPRES) 0.1 MG tablet Take 1 tablet (0.1 mg total) by mouth 2 (two) times daily. 60 tablet 1   dorzolamide-timolol (COSOPT) 22.3-6.8 MG/ML ophthalmic solution Place 1 drop into both eyes 2 (two) times daily.     metoprolol succinate (TOPROL-XL) 25 MG 24 hr tablet Take 0.5 tablets (12.5 mg total) by mouth daily. 30 tablet 0   sertraline (ZOLOFT) 25 MG tablet Take 1 tablet (25 mg total) by mouth daily. 90 tablet 1   atorvastatin (LIPITOR) 40 MG tablet Take 1 tablet (40 mg total) by mouth daily. (Patient not taking: Reported on 08/17/2021) 90 tablet 1   COVID-19 mRNA bivalent vaccine, Pfizer, (PFIZER COVID-19 VAC BIVALENT) injection Inject into the muscle. (Patient not taking: Reported on 08/17/2021) 0.3 mL 0   hydrALAZINE (APRESOLINE) 25 MG tablet Take 1 tablet (25 mg total) by mouth every 8 (eight) hours. (Patient not taking: Reported on 08/17/2021) 90 tablet 1   No facility-administered medications prior to visit.    Allergies  Allergen Reactions   Latanoprost Other (See Comments)    Eye irritation   Netarsudil Other (See Comments)    Eye burning and irritation    ROS Review of Systems  Constitutional:  Negative for appetite change, diaphoresis, fatigue and unexpected weight change.  Eyes:  Negative for pain, redness and visual disturbance.  Respiratory:  Negative for cough, chest tightness, shortness of breath and wheezing.   Cardiovascular:  Negative for chest pain, palpitations and leg swelling.  Endocrine: Negative for cold intolerance, heat intolerance, polydipsia, polyphagia and polyuria.  Genitourinary:  Negative for difficulty urinating, dysuria and frequency.  Neurological:  Negative for dizziness, light-headedness, numbness and headaches.  Psychiatric/Behavioral:  Positive for confusion. Negative for agitation and behavioral problems.       Objective:    Physical Exam Vitals and nursing note reviewed.  Constitutional:      Appearance: He is well-developed.  HENT:     Head: Normocephalic and atraumatic.  Eyes:     Pupils: Pupils are equal, round, and reactive to light.  Neck:     Thyroid: No thyromegaly.  Cardiovascular:     Rate and Rhythm: Normal rate and regular rhythm.     Heart sounds: No murmur heard. Pulmonary:     Effort: Pulmonary effort is normal. No respiratory distress.     Breath sounds: Normal breath sounds. No wheezing or rales.  Chest:  Chest wall: No tenderness.  Musculoskeletal:        General: No tenderness.     Cervical back: Normal range of motion and neck supple.  Skin:    General: Skin is warm and dry.  Neurological:     Mental Status: He is alert and oriented to person, place, and time.  Psychiatric:        Behavior: Behavior normal.        Thought Content: Thought content normal.        Judgment: Judgment normal.    BP 118/72 (BP Location: Left Arm, Patient Position: Sitting, Cuff Size: Normal)   Pulse (!) 57   Temp 98.6 F (37 C) (Oral)   Resp 16   Ht 5\' 8"  (1.727 m)   Wt 125 lb 9.6 oz (57 kg)   SpO2 98%   BMI 19.10 kg/m  Wt Readings from Last 3 Encounters:  08/17/21 125 lb 9.6 oz (57 kg)  06/11/21 134 lb (60.8 kg)  05/11/21 135 lb 3.2 oz (61.3 kg)     Health Maintenance Due  Topic Date Due   Zoster Vaccines- Shingrix (1 of 2) Never done   TETANUS/TDAP  02/23/2015    There are no preventive care reminders to display for this patient.  Lab Results  Component Value Date   TSH 1.73 05/11/2021   Lab Results  Component Value Date   WBC 6.1 05/11/2021   HGB 11.5 (L) 05/11/2021   HCT 34.5 (L) 05/11/2021   MCV 92.5 05/11/2021   PLT 164.0 05/11/2021   Lab Results  Component Value Date   NA 140 05/11/2021   K 4.1 05/11/2021   CO2 25 05/11/2021   GLUCOSE 79 05/11/2021   BUN 16 05/11/2021   CREATININE 1.45 05/11/2021   BILITOT 1.2 05/11/2021   ALKPHOS  47 05/11/2021   AST 16 05/11/2021   ALT 8 05/11/2021   PROT 6.5 05/11/2021   ALBUMIN 3.4 (L) 05/11/2021   CALCIUM 8.5 05/11/2021   ANIONGAP 8 11/26/2020   GFR 43.38 (L) 05/11/2021   Lab Results  Component Value Date   CHOL 137 05/11/2021   Lab Results  Component Value Date   HDL 41.90 05/11/2021   Lab Results  Component Value Date   LDLCALC 80 05/11/2021   Lab Results  Component Value Date   TRIG 76.0 05/11/2021   Lab Results  Component Value Date   CHOLHDL 3 05/11/2021   Lab Results  Component Value Date   HGBA1C 4.8 11/26/2020      Assessment & Plan:   Problem List Items Addressed This Visit       Unprioritized   Essential hypertension    Well controlled, no changes to meds. Encouraged heart healthy diet such as the DASH diet and exercise as tolerated.       Hyperlipidemia LDL goal <100    Encourage heart healthy diet such as MIND or DASH diet, increase exercise, avoid trans fats, simple carbohydrates and processed foods, consider a krill or fish or flaxseed oil cap daily.       Other Visit Diagnoses     Primary hypertension    -  Primary   Relevant Orders   Comprehensive metabolic panel   CBC with Differential/Platelet   Lipid panel   Hyperlipidemia, unspecified hyperlipidemia type       Relevant Orders   Comprehensive metabolic panel   CBC with Differential/Platelet   Lipid panel       No orders of the defined types  were placed in this encounter.   Follow-up: Return in about 6 months (around 02/15/2022) for annual exam, fasting.    [name], DO

## 2021-08-17 NOTE — Assessment & Plan Note (Signed)
Encourage heart healthy diet such as MIND or DASH diet, increase exercise, avoid trans fats, simple carbohydrates and processed foods, consider a krill or fish or flaxseed oil cap daily.  °

## 2021-08-17 NOTE — Assessment & Plan Note (Signed)
Well controlled, no changes to meds. Encouraged heart healthy diet such as the DASH diet and exercise as tolerated.  °

## 2021-08-17 NOTE — Patient Instructions (Signed)

## 2021-08-20 ENCOUNTER — Other Ambulatory Visit: Payer: Self-pay | Admitting: Family Medicine

## 2021-08-20 DIAGNOSIS — D72829 Elevated white blood cell count, unspecified: Secondary | ICD-10-CM

## 2021-10-22 ENCOUNTER — Other Ambulatory Visit: Payer: Self-pay | Admitting: Family Medicine

## 2021-10-22 DIAGNOSIS — I10 Essential (primary) hypertension: Secondary | ICD-10-CM

## 2021-12-09 ENCOUNTER — Ambulatory Visit: Payer: Federal, State, Local not specified - PPO | Admitting: Physician Assistant

## 2021-12-09 ENCOUNTER — Encounter: Payer: Self-pay | Admitting: Physician Assistant

## 2021-12-09 ENCOUNTER — Other Ambulatory Visit: Payer: Self-pay

## 2021-12-09 VITALS — BP 162/87 | HR 62 | Resp 18 | Ht 68.0 in | Wt 131.0 lb

## 2021-12-09 DIAGNOSIS — F03B18 Unspecified dementia, moderate, with other behavioral disturbance: Secondary | ICD-10-CM

## 2021-12-09 DIAGNOSIS — G309 Alzheimer's disease, unspecified: Secondary | ICD-10-CM

## 2021-12-09 DIAGNOSIS — F0280 Dementia in other diseases classified elsewhere without behavioral disturbance: Secondary | ICD-10-CM

## 2021-12-09 MED ORDER — DIVALPROEX SODIUM 125 MG PO DR TAB: 125.0000 mg | DELAYED_RELEASE_TABLET | Freq: Every day | ORAL | 11 refills | Status: DC

## 2021-12-09 NOTE — Patient Instructions (Signed)
It was a pleasure to see you today at our office.  ? ?Recommendations: ? ?Continue Sertraline 25 mg at night  ?No naps after 2 pm  ?Start Depakote 125 mg at night, may increase to 2 x a day if needed  ?Follow up in 6 months  ?Look into Caregiver support group  ? ? ?RECOMMENDATIONS FOR ALL PATIENTS WITH MEMORY PROBLEMS: ?1. Continue to exercise (Recommend 30 minutes of walking everyday, or 3 hours every week) ?2. Increase social interactions - continue going to [place] and enjoy social gatherings with friends and family ?3. Eat healthy, avoid fried foods and eat more fruits and vegetables ?4. Maintain adequate blood pressure, blood sugar, and blood cholesterol level. Reducing the risk of stroke and cardiovascular disease also helps promoting better memory. ?5. Avoid stressful situations. Live a simple life and avoid aggravations. Organize your time and prepare for the next day in anticipation. ?6. Sleep well, avoid any interruptions of sleep and avoid any distractions in the bedroom that may interfere with adequate sleep quality ?7. Avoid sugar, avoid sweets as there is a strong link between excessive sugar intake, diabetes, and cognitive impairment ?We discussed the Mediterranean diet, which has been shown to help patients reduce the risk of progressive memory disorders and reduces cardiovascular risk. This includes eating fish, eat fruits and green leafy vegetables, nuts like almonds and hazelnuts, walnuts, and also use olive oil. Avoid fast foods and fried foods as much as possible. Avoid sweets and sugar as sugar use has been linked to worsening of memory function. ? ?There is always a concern of gradual progression of memory problems. If this is the case, then we may need to adjust level of care according to patient needs. Support, both to the patient and caregiver, should then be put into place.  ? ? ? ?FALL PRECAUTIONS: Be cautious when walking. Scan the area for obstacles that may increase the risk of trips  and falls. When getting up in the mornings, sit up at the edge of the bed for a few minutes before getting out of bed. Consider elevating the bed at the head end to avoid drop of blood pressure when getting up. Walk always in a well-lit room (use night lights in the walls). Avoid area rugs or power cords from appliances in the middle of the walkways. Use a walker or a cane if necessary and consider physical therapy for balance exercise. Get your eyesight checked regularly. ? ?FINANCIAL OVERSIGHT: Supervision, especially oversight when making financial decisions or transactions is also recommended. ? ?HOME SAFETY: Consider the safety of the kitchen when operating appliances like stoves, microwave oven, and blender. Consider having supervision and share cooking responsibilities until no longer able to participate in those. Accidents with firearms and other hazards in the house should be identified and addressed as well. ? ? ?ABILITY TO BE LEFT ALONE: If patient is unable to contact 911 operator, consider using [facility]Line, or when the need is there, arrange for someone to stay with patients. Smoking is a fire hazard, consider supervision or cessation. Risk of wandering should be assessed by caregiver and if detected at any point, supervision and safe proof recommendations should be instituted. ? ?MEDICATION SUPERVISION: Inability to self-administer medication needs to be constantly addressed. Implement a mechanism to ensure safe administration of the medications. ? ? ?DRIVING: Regarding driving, in patients with progressive memory problems, driving will be impaired. We advise to have someone else do the driving if trouble finding directions or if minor accidents are  reported. Independent driving assessment is available to determine safety of driving. ? ? ? ? ? ?Mediterranean Diet ?A Mediterranean diet refers to food and lifestyle choices that are based on the traditions of countries located on the [employer]. This  way of eating has been shown to help prevent certain conditions and improve outcomes for people who have chronic diseases, like kidney disease and heart disease. ?What are tips for following this plan? ?Lifestyle  ?Cook and eat meals together with your family, when possible. ?Drink enough fluid to keep your urine clear or pale yellow. ?Be physically active every day. This includes: ?Aerobic exercise like running or swimming. ?Leisure activities like gardening, walking, or housework. ?Get 7-8 hours of sleep each night. ?If recommended by your health care provider, drink red wine in moderation. This means 1 glass a day for nonpregnant women and 2 glasses a day for men. A glass of wine equals 5 oz (150 mL). ?Reading food labels  ?Check the serving size of packaged foods. For foods such as rice and pasta, the serving size refers to the amount of cooked product, not dry. ?Check the total fat in packaged foods. Avoid foods that have saturated fat or trans fats. ?Check the ingredients list for added sugars, such as corn syrup. ?Shopping  ?At the grocery store, buy most of your food from the areas near the walls of the store. This includes: ?Fresh fruits and vegetables (produce). ?Grains, beans, nuts, and seeds. Some of these may be available in unpackaged forms or large amounts (in bulk). ?Fresh seafood. ?Poultry and eggs. ?Low-fat dairy products. ?Buy whole ingredients instead of prepackaged foods. ?Buy fresh fruits and vegetables in-season from local farmers markets. ?Buy frozen fruits and vegetables in resealable bags. ?If you do not have access to quality fresh seafood, buy precooked frozen shrimp or canned fish, such as tuna, salmon, or sardines. ?Buy small amounts of raw or cooked vegetables, salads, or olives from the deli or salad bar at your store. ?Stock your pantry so you always have certain foods on hand, such as olive oil, canned tuna, canned tomatoes, rice, pasta, and beans. ?Cooking  ?Cook foods with  extra-virgin olive oil instead of using butter or other vegetable oils. ?Have meat as a side dish, and have vegetables or grains as your main dish. This means having meat in small portions or adding small amounts of meat to foods like pasta or stew. ?Use beans or vegetables instead of meat in common dishes like chili or lasagna. ?Experiment with different cooking methods. Try roasting or broiling vegetables instead of steaming or saut?eing them. ?Add frozen vegetables to soups, stews, pasta, or rice. ?Add nuts or seeds for added healthy fat at each meal. You can add these to yogurt, salads, or vegetable dishes. ?Marinate fish or vegetables using olive oil, lemon juice, garlic, and fresh herbs. ?Meal planning  ?Plan to eat 1 vegetarian meal one day each week. Try to work up to 2 vegetarian meals, if possible. ?Eat seafood 2 or more times a week. ?Have healthy snacks readily available, such as: ?Vegetable sticks with hummus. ?[place] yogurt. ?Fruit and nut trail mix. ?Eat balanced meals throughout the week. This includes: ?Fruit: 2-3 servings a day ?Vegetables: 4-5 servings a day ?Low-fat dairy: 2 servings a day ?Fish, poultry, or lean meat: 1 serving a day ?Beans and legumes: 2 or more servings a week ?Nuts and seeds: 1-2 servings a day ?Whole grains: 6-8 servings a day ?Extra-virgin olive oil: 3-4 servings a day ?Limit  red meat and sweets to only a few servings a month ?What are my food choices? ?Mediterranean diet ?Recommended ?Grains: Whole-grain pasta. Brown rice. Bulgar wheat. Polenta. Couscous. Whole-wheat bread. [place]. ?Vegetables: Artichokes. Beets. Broccoli. Cabbage. Carrots. Eggplant. Green beans. Chard. Kale. Spinach. Onions. Leeks. Peas. Squash. Tomatoes. Peppers. Radishes. ?Fruits: Apples. Apricots. Avocado. Berries. Bananas. Cherries. Dates. Figs. Grapes. Lemons. Melon. Oranges. Peaches. Plums. Pomegranate. ?Meats and other protein foods: Beans. Almonds. Sunflower seeds. Pine nuts. Peanuts. [place].  Salmon. Scallops. Shrimp. [place]. Tilapia. Clams. Oysters. Eggs. ?Dairy: Low-fat milk. Cheese. Greek yogurt. ?Beverages: Water. Red wine. Herbal tea. ?Fats and oils: Extra virgin olive oil. Avocado oil. Grape

## 2021-12-09 NOTE — Progress Notes (Signed)
? ?Assessment/Plan:  ? ? 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 ?Last MMSE  05/2021 was14/30 Delayed recall was 0/3.  Neurocognitive testing in January 2022 indicating Mild Neurocognitive disorder, etiology concerning for Alzheimer's disease. Patient is not on any cognitive medications due to a history of bradycardia. He is on Zoloft 25 mg daily for mood control.  He continues to have hallucinations and paranoia. ?  ? Recommendations:  ?Discussed safety both in and out of the home.  ?Discussed the importance of regular daily schedule with inclusion of crossword puzzles to maintain brain function.  ?Continue Sertraline 25 mg nightly ?Start Depakote 125 mg nightly, may increase to 125 mg twice daily if needed, for mood control including paranoia and hallucinations. ?Stay active at least 30 minutes at least 3 times a week.  ?Naps should be scheduled and should be no longer than 60 minutes and should not occur after 2 PM.  ?Follow up in 6 months. ? ? ?Subjective:  ? ? ?[name] [name] is a 88 y.o. male  right-handed man with a history of hypertension, hyperlipidemia, CAD, bradycardia, with Neurocognitive testing in January 2022 indicating Mild Neurocognitive disorder, etiology concerning for Alzheimer's disease, seen today in follow up for memory loss. This patient is accompanied in the office by his wife who supplements the history.  Previous records as well as any outside records available were reviewed prior to todays visit.   ? ?He states that his mood in the morning is "okay", and wife adds that during the evening, it gets worse, although he sees people that they are not there, during the night, his behavior is more evident.  "All the time he sees all these people that they are not there, men, women, and today he was seeing little children ".  He also reports that he wants to go home even though he lives in the house for decades.  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  He has a history of depression.  He is not sleeping very well, he gets confused. He has vivid dreams at times, but denies REM behavior.  Denies sleepwalking.  "He loses everything ", but denies placing objects in unusual places.  His wife is in charge of the medications because he has not been taking them.  She also takes care of the finances.  Appetite is good, denies trouble swallowing.  "He drinks too many pop sodas ".  His wife does the cooking.  He ambulates without difficulty without the use of a walker or a cane without further falls.  No further wandering behavior.  He no longer drives.  He denies any headaches, anosmia, he has a history of glaucoma, but denies double vision, dizziness, focal numbness or tingling, unilateral great weakness or tremors, he denies any urine incontinence, retention constipation or diarrhea. ? ?Initial evaluation on 12/09/2020, Dr. [name] this is an 88 year old right-handed man with a history of hypertension, hyperlipidemia, CAD, bradycardia, presenting for evaluation of Mild Neurocognitive disorder, etiology concerning for Alzheimer's disease. He is accompanied by his wife who helps supplement the history today. He underwent Neuropsychological evaluation in January 2022 with Dr. [name], results limited due to visual impairment, however pattern of performance is suggestive of fairly diffuse cognitive impairment concerning for late-onset Alzheimer's disease. He largely denied difficulties completing ADLs, which his  wife did not strongly contradict, meeting criteria for Mild Neurocognitive disorder, likely at severe end of this spectrum and at increased risk for conversion to Major Neurocognitive disorder.  ?  ?He states his memory is "kind of terrible." His wife started noticing mood swings, but over the past year he has had  progressively worsening memory where he would forget things, misplacing things frequently. He stopped driving in 6256 due to vision issues. His wife also took over writing his checks due to vision changes, however they still have separate accounts and his son is his POA. He states she manages some medications, he does some of them, and he sometimes forgets. He cooks sometimes, but his wife has noticed he has forgotten how to cook some foods, such a steak he made recently. He has not left the stove on. He is independent with dressing and bathing. His father had memory issues. He was in the [employer] where he had head injuries, no neurosurgical procedures. He denies any alcohol use. He reports his mood is terrible, he gets frustrated not being able to do things. His wife reports he does not remember where his money is, he gets frustrated and can't get it together when he counts it. His wife also reports visual hallucinations for the past 3-4 months, he sees people in the house, talking to little children. He denies any auditory component. He sleeps well at night, however sleeps during the day as well, taking naps when he sits down. ?  ?He was admitted to [facility] last 11/25/20 for acute encephalopathy. His wife reports they were in the car to Dr. [name] office when he suddenly got confused and could hardly get out of the car. When they got to the parking lot, he was walking around in circles.  His wife had reported that he "fell asleep" and when they got to the office he was too weak to get out and walk on his own, needing a wheelchair. BP was 140/100. He was noted to have slurred speech, very lethargic, falling asleep on the wheelchair. He was transferred to [facility], BP was 207/87, HR as low as the 30s at some point during his stay, at that point conversing appropriately. I personally reviewed MRI brain without contrast which did not show any acute changes. There was moderate diffuse atrophy and mild chronic  microvascular disease. CTA showed calcified plaque within the proximal left ICA with estimated 70% stenosis, however this could be exaggerated by blooming artifact from calcified plaque, right ICA less than 50% stenosis. His wife notes he was still confused and kept thinking that he was in a shack and not in the hospital (note of in-hospital delirium). EEG was normal. He denies any headaches, dizziness, diplopia, dysarthria/dysphagia, neck/back pain, focal numbness/tingling/weakness, bowel/bladder dysfunction, anosmia, or tremors.  ? ?Lab Results  ?Component Value Date  ?  TSH 1.71 05/01/2019  ?  ?  ?Recent Labs'[]'$ Expand by Default  ?     ?Lab Results  ?Component Value Date  ?  [id number] 497 05/01/2019  ?  ? ?MRI of the brain 11/26/2020 no acute intracranial abnormality  Generalized age-related cerebral atrophy with mild chronic small vessel ischemic disease. ?  ?PREVIOUS MEDICATIONS:  ? ?CURRENT MEDICATIONS:  ?Outpatient Encounter Medications as of 12/09/2021  ?Medication Sig  ? brimonidine (ALPHAGAN) 0.2 % ophthalmic solution Place 1 drop into both eyes in the morning and at bedtime.  ? cloNIDine (CATAPRES) 0.1 MG tablet Take 1 tablet by mouth twice daily  ? dorzolamide-timolol (COSOPT) 22.3-6.8  MG/ML ophthalmic solution Place 1 drop into both eyes 2 (two) times daily.  ? metoprolol succinate (TOPROL-XL) 25 MG 24 hr tablet Take 0.5 tablets (12.5 mg total) by mouth daily.  ? sertraline (ZOLOFT) 25 MG tablet Take 1 tablet (25 mg total) by mouth daily.  ? ?No facility-administered encounter medications on file as of 12/09/2021.  ? ? ? ?Objective:  ?  ? ?PHYSICAL EXAMINATION:   ? ?VITALS:   ?Vitals:  ? 12/09/21 0853  ?BP: (!) 162/87  ?Pulse: 62  ?Resp: 18  ?SpO2: 98%  ?Weight: 131 lb (59.4 kg)  ?Height: '5\' 8"'$  (1.727 m)  ? ? ? ?GEN:  The patient appears stated age and is in NAD. ?HEENT:  Normocephalic, atraumatic.  ? ?Neurological examination: ? ?General: NAD, well-groomed, appears stated age. ?Orientation: The patient is  alert. Oriented to person, place and not to date, is 30, it is the spring.  Delayed recall 0/3 he does note that he is in [facility]. ?Cranial nerves: There is good facial symmetry.Terie Purser

## 2021-12-30 ENCOUNTER — Other Ambulatory Visit: Payer: Self-pay | Admitting: Neurology

## 2021-12-30 ENCOUNTER — Other Ambulatory Visit: Payer: Self-pay | Admitting: Family Medicine

## 2021-12-30 DIAGNOSIS — F32A Depression, unspecified: Secondary | ICD-10-CM

## 2021-12-30 DIAGNOSIS — I10 Essential (primary) hypertension: Secondary | ICD-10-CM

## 2022-01-01 ENCOUNTER — Other Ambulatory Visit: Payer: Self-pay

## 2022-01-01 ENCOUNTER — Telehealth: Payer: Self-pay | Admitting: Physician Assistant

## 2022-01-01 DIAGNOSIS — F32A Depression, unspecified: Secondary | ICD-10-CM

## 2022-01-01 MED ORDER — SERTRALINE HCL 25 MG PO TABS: 25.0000 mg | ORAL_TABLET | Freq: Every day | ORAL | 1 refills | Status: DC

## 2022-01-01 NOTE — Telephone Encounter (Signed)
Patients wife called regarding his zoloft. She said aquino is the one that prescribed this medicine, not his PCP. Said he needs a refill, and we declined the request ?

## 2022-01-01 NOTE — Telephone Encounter (Signed)
Sent to pharmacy, tried to call patient ?

## 2022-01-01 NOTE — Telephone Encounter (Signed)
Patient's wife called back and left a message requesting a call back at the home number: [phone]. ? ?I returned her call for scheduling and she explained patient was just seen in March 2023 and is already set to return in 6 month as instructed. ? ?Please refill per spouse to last until next appointment. ? ?[facility] [address] ?

## 2022-01-01 NOTE — Telephone Encounter (Signed)
Tried calling patient no answer at 8:18am, patient needs an appt before further refills of zoloft.   ?

## 2022-02-23 ENCOUNTER — Encounter: Payer: Self-pay | Admitting: Family Medicine

## 2022-02-23 ENCOUNTER — Ambulatory Visit: Payer: Federal, State, Local not specified - PPO | Admitting: Family Medicine

## 2022-02-23 VITALS — BP 138/80 | HR 52 | Temp 98.0°F | Resp 18 | Ht 68.0 in | Wt 133.8 lb

## 2022-02-23 DIAGNOSIS — G309 Alzheimer's disease, unspecified: Secondary | ICD-10-CM

## 2022-02-23 DIAGNOSIS — F0670 Mild neurocognitive disorder due to known physiological condition without behavioral disturbance: Secondary | ICD-10-CM

## 2022-02-23 DIAGNOSIS — F02A18 Dementia in other diseases classified elsewhere, mild, with other behavioral disturbance: Secondary | ICD-10-CM

## 2022-02-23 DIAGNOSIS — I10 Essential (primary) hypertension: Secondary | ICD-10-CM

## 2022-02-23 DIAGNOSIS — Z23 Encounter for immunization: Secondary | ICD-10-CM

## 2022-02-23 DIAGNOSIS — E785 Hyperlipidemia, unspecified: Secondary | ICD-10-CM

## 2022-02-23 DIAGNOSIS — H401193 Primary open-angle glaucoma, unspecified eye, severe stage: Secondary | ICD-10-CM

## 2022-02-23 DIAGNOSIS — F32A Depression, unspecified: Secondary | ICD-10-CM | POA: Diagnosis not present

## 2022-02-23 LAB — CBC WITH DIFFERENTIAL/PLATELET
Basophils Absolute: 0 10*3/uL (ref 0.0–0.1)
Basophils Relative: 0.5 % (ref 0.0–3.0)
Eosinophils Absolute: 0.1 10*3/uL (ref 0.0–0.7)
Eosinophils Relative: 2.3 % (ref 0.0–5.0)
HCT: 36.2 % — ABNORMAL LOW (ref 39.0–52.0)
Hemoglobin: 11.9 g/dL — ABNORMAL LOW (ref 13.0–17.0)
Lymphocytes Relative: 23.4 % (ref 12.0–46.0)
Lymphs Abs: 1.3 10*3/uL (ref 0.7–4.0)
MCHC: 33 g/dL (ref 30.0–36.0)
MCV: 93.3 fl (ref 78.0–100.0)
Monocytes Absolute: 0.6 10*3/uL (ref 0.1–1.0)
Monocytes Relative: 10.4 % (ref 3.0–12.0)
Neutro Abs: 3.6 10*3/uL (ref 1.4–7.7)
Neutrophils Relative %: 63.4 % (ref 43.0–77.0)
Platelets: 163 10*3/uL (ref 150.0–400.0)
RBC: 3.88 Mil/uL — ABNORMAL LOW (ref 4.22–5.81)
RDW: 13.5 % (ref 11.5–15.5)
WBC: 5.7 10*3/uL (ref 4.0–10.5)

## 2022-02-23 LAB — COMPREHENSIVE METABOLIC PANEL
ALT: 6 U/L (ref 0–53)
AST: 14 U/L (ref 0–37)
Albumin: 3.5 g/dL (ref 3.5–5.2)
Alkaline Phosphatase: 46 U/L (ref 39–117)
BUN: 17 mg/dL (ref 6–23)
CO2: 29 mEq/L (ref 19–32)
Calcium: 8.7 mg/dL (ref 8.4–10.5)
Chloride: 106 mEq/L (ref 96–112)
Creatinine, Ser: 1.94 mg/dL — ABNORMAL HIGH (ref 0.40–1.50)
GFR: 30.42 mL/min — ABNORMAL LOW (ref >60.00)
Glucose, Bld: 78 mg/dL (ref 70–99)
Potassium: 3.8 mEq/L (ref 3.5–5.1)
Sodium: 141 mEq/L (ref 135–145)
Total Bilirubin: 1.1 mg/dL (ref 0.2–1.2)
Total Protein: 6.6 g/dL (ref 6.0–8.3)

## 2022-02-23 LAB — LIPID PANEL
Cholesterol: 147 mg/dL (ref 0–200)
HDL: 45.5 mg/dL (ref >39.00)
LDL Cholesterol: 77 mg/dL (ref 0–99)
NonHDL: 101.17
Total CHOL/HDL Ratio: 3
Triglycerides: 123 mg/dL (ref 0.0–149.0)
VLDL: 24.6 mg/dL (ref 0.0–40.0)

## 2022-02-23 LAB — TSH: TSH: 1.93 u[IU]/mL (ref 0.35–5.50)

## 2022-02-23 LAB — VITAMIN B12: Vitamin B-12: 402 pg/mL (ref 211–911)

## 2022-02-23 MED ORDER — DIVALPROEX SODIUM 125 MG PO DR TAB: 125.0000 mg | DELAYED_RELEASE_TABLET | Freq: Two times a day (BID) | ORAL | 1 refills | Status: AC

## 2022-02-23 MED ORDER — METOPROLOL SUCCINATE ER 25 MG PO TB24: 12.5000 mg | ORAL_TABLET | Freq: Every day | ORAL | 1 refills | Status: DC

## 2022-02-23 MED ORDER — CLONIDINE HCL 0.1 MG PO TABS: 0.1000 mg | ORAL_TABLET | Freq: Two times a day (BID) | ORAL | 1 refills | Status: DC

## 2022-02-23 NOTE — Assessment & Plan Note (Signed)
Per opth=== pt legally blind

## 2022-02-23 NOTE — Progress Notes (Signed)
Subjective:   By signing my name below, I, [name] [name], attest that this documentation has been prepared under the direction and in the presence of [name], DO  02/23/2022    Patient ID: [name] [name], male    DOB: Jan 16, 1932, 88 y.o.   MRN: [id number]  Chief Complaint  Patient presents with   Hyperlipidemia   Hypertension   Memory Loss    HPI Patient is in today for a follow up visit. He is present with his wife during this visit.   His wife is requesting a refill for 0.1 mg clonidine and 25 mg metoprolol succinate.  His wife reports he is seeing the neurologist regularly. She reports no new improvement in his symptoms.  She reports patient is wandering more often and leaving the house. She has an alarm on the front door but patient is getting around the alarm. Patient does not remember where he lives so when he wanders he cannot tell others where he is from. She reports patient is not sleeping at night and falls asleep during the morning.  Patient does not have the shingles vaccine. His wife does not remember the last tetanus vaccine patient received.    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; chronic left thalamic   Mild neurocognitive disorder due to Alzheimer's disease, possible 09/26/2020   Primary open-angle glaucoma, severe stage 12/29/2006   Renal insufficiency    Weight loss observed 06/19/2008    Past Surgical History:   Procedure Laterality Date   BLADDER SURGERY     CATARACT EXTRACTION     CHOLECYSTECTOMY  03/2010   [place] [facility]   COLONOSCOPY     removal of cyst     right arm   SHOULDER SURGERY     cyst removed left shoulder   TONSILECTOMY, ADENOIDECTOMY, BILATERAL MYRINGOTOMY AND TUBES     TRANSURETHRAL RESECTION OF PROSTATE      Family History  Problem Relation Age of Onset   Coronary artery disease Mother    Hypertension Mother    Heart disease Mother    Diabetes Sister    Heart disease Sister 4       died during cabg   Hypertension Daughter    Hypertension Son    Alzheimer's disease Father     Social History   Socioeconomic History   Marital status: Married    Spouse name: Not on file   Number of children: Not on file   Years of education: 14   Highest education level: Some college, no degree  Occupational History   Occupation: retired Actor  Tobacco Use   Smoking status: Former    Packs/day: 0.30    Years: 20.00    Total pack years: 6.00    Types: Cigarettes    Quit date: 02/22/1993    Years since quitting: 29.0   Smokeless tobacco: Never  Vaping Use   Vaping Use: Never used  Substance and Sexual  Activity   Alcohol use: No   Drug use: Not Currently    Types: Marijuana    Comment: past marijuana use; none currently   Sexual activity: Not Currently    Partners: Female  Other Topics Concern   Not on file  Social History Narrative   Exercise--- stair step,  Sit ups, pull ups and bike   Lives with wife   Right handed    Social Determinants of Health   Financial Resource Strain: Low Risk  (05/27/2021)   Overall Financial Resource Strain (CARDIA)    Difficulty of Paying Living Expenses: Not hard at all  Food Insecurity: No Food Insecurity (05/27/2021)   Hunger Vital Sign    Worried About Running Out of Food in the Last Year: Never true    Ran Out of Food in the Last Year: Never true  Transportation Needs: No Transportation Needs (05/27/2021)    PRAPARE - Hydrologist (Medical): No    Lack of Transportation (Non-Medical): No  Physical Activity: Inactive (05/27/2021)   Exercise Vital Sign    Days of Exercise per Week: 0 days    Minutes of Exercise per Session: 0 min  Stress: No Stress Concern Present (05/27/2021)   [place]    Feeling of Stress : Not at all  Social Connections: Moderately Integrated (05/27/2021)   Social Connection and Isolation Panel [NHANES]    Frequency of Communication with Friends and Family: More than three times a week    Frequency of Social Gatherings with Friends and Family: More than three times a week    Attends Religious Services: 1 to 4 times per year    Active Member of [employer] or Organizations: No    Attends Archivist Meetings: Never    Marital Status: Married  Human resources officer Violence: Not At Risk (05/27/2021)   Humiliation, Afraid, Rape, and Kick questionnaire    Fear of Current or Ex-Partner: No    Emotionally Abused: No    Physically Abused: No    Sexually Abused: No    Outpatient Medications Prior to Visit  Medication Sig Dispense Refill   brimonidine (ALPHAGAN) 0.2 % ophthalmic solution Place 1 drop into both eyes in the morning and at bedtime.     dorzolamide-timolol (COSOPT) 22.3-6.8 MG/ML ophthalmic solution Place 1 drop into both eyes 2 (two) times daily.     sertraline (ZOLOFT) 25 MG tablet Take 1 tablet (25 mg total) by mouth daily. 90 tablet 1   cloNIDine (CATAPRES) 0.1 MG tablet Take 1 tablet by mouth twice daily 60 tablet 0   divalproex (DEPAKOTE) 125 MG DR tablet Take 1 tablet (125 mg total) by mouth at bedtime. 30 tablet 11   metoprolol succinate (TOPROL-XL) 25 MG 24 hr tablet Take 0.5 tablets (12.5 mg total) by mouth daily. 30 tablet 0   No facility-administered medications prior to visit.    Allergies  Allergen Reactions   Latanoprost Other (See Comments)    Eye  irritation   Netarsudil Other (See Comments)    Eye burning and irritation    Review of Systems  Constitutional:  Negative for fever and malaise/fatigue.  HENT:  Negative for congestion.   Eyes:  Negative for blurred vision.  Respiratory:  Negative for cough and shortness of breath.   Cardiovascular:  Negative for chest pain, palpitations and leg swelling.  Gastrointestinal:  Negative for vomiting.  Musculoskeletal:  Negative for [name] pain.  Skin:  Negative for rash.  Neurological:  Negative for loss of consciousness and headaches.       Objective:    Physical Exam Vitals and nursing note reviewed.  Constitutional:      General: He is not in acute distress.    Appearance: Normal appearance. He is well-developed. He is not ill-appearing.  HENT:     Head: Normocephalic and atraumatic.     Right Ear: External ear normal.     Left Ear: External ear normal.  Eyes:     Extraocular Movements: Extraocular movements intact.     Pupils: Pupils are equal, round, and reactive to light.  Neck:     Thyroid: No thyromegaly.  Cardiovascular:     Rate and Rhythm: Normal rate and regular rhythm.     Heart sounds: Normal heart sounds. No murmur heard.    No gallop.  Pulmonary:     Effort: Pulmonary effort is normal. No respiratory distress.     Breath sounds: Normal breath sounds. No wheezing or rales.  Chest:     Chest wall: No tenderness.  Musculoskeletal:     Cervical [name]: Normal range of motion and neck supple.     Right hip: Tenderness present. Normal range of motion. Normal strength.     Left hip: Tenderness present. Normal range of motion. Normal strength.     Right foot: Bony tenderness present. No swelling.     Left foot: Bony tenderness present. No swelling.  Skin:    General: Skin is warm and dry.  Neurological:     General: No focal deficit present.     Mental Status: He is alert and oriented to person, place, and time.  Psychiatric:        Mood and Affect: Mood  normal.        Behavior: Behavior normal.        Thought Content: Thought content normal.        Judgment: Judgment normal.     BP 138/80 (BP Location: Left Arm, Patient Position: Sitting, Cuff Size: Normal)   Pulse (!) 52   Temp 98 F (36.7 C) (Oral)   Resp 18   Ht '5\' 8"'$  (1.727 m)   Wt 133 lb 12.8 oz (60.7 kg)   SpO2 97%   BMI 20.34 kg/m  Wt Readings from Last 3 Encounters:  02/23/22 133 lb 12.8 oz (60.7 kg)  12/09/21 131 lb (59.4 kg)  08/17/21 125 lb 9.6 oz (57 kg)    Diabetic Foot Exam - Simple   No data filed    Lab Results  Component Value Date   WBC 13.1 (H) 08/17/2021   HGB 11.3 (L) 08/17/2021   HCT 34.5 (L) 08/17/2021   PLT 163.0 08/17/2021   GLUCOSE 147 (H) 08/17/2021   CHOL 133 08/17/2021   TRIG 74.0 08/17/2021   HDL 41.50 08/17/2021   LDLDIRECT 170.2 10/09/2007   LDLCALC 76 08/17/2021   ALT 11 08/17/2021   AST 15 08/17/2021   NA 137 08/17/2021   K 3.6 08/17/2021   CL 106 08/17/2021   CREATININE 1.87 (H) 08/17/2021   BUN 28 (H) 08/17/2021   CO2 22 08/17/2021   TSH 1.73 05/11/2021   PSA 52.33 (H) 05/11/2021   INR 1.0 11/25/2020   HGBA1C 4.8 11/26/2020   MICROALBUR 9.4 (H) 10/06/2015    Lab Results  Component Value Date   TSH 1.73 05/11/2021   Lab Results  Component Value Date   WBC 13.1 (H) 08/17/2021   HGB 11.3 (  L) 08/17/2021   HCT 34.5 (L) 08/17/2021   MCV 94.0 08/17/2021   PLT 163.0 08/17/2021   Lab Results  Component Value Date   NA 137 08/17/2021   K 3.6 08/17/2021   CO2 22 08/17/2021   GLUCOSE 147 (H) 08/17/2021   BUN 28 (H) 08/17/2021   CREATININE 1.87 (H) 08/17/2021   BILITOT 2.0 (H) 08/17/2021   ALKPHOS 45 08/17/2021   AST 15 08/17/2021   ALT 11 08/17/2021   PROT 6.3 08/17/2021   ALBUMIN 3.4 (L) 08/17/2021   CALCIUM 8.5 08/17/2021   ANIONGAP 8 11/26/2020   GFR 31.91 (L) 08/17/2021   Lab Results  Component Value Date   CHOL 133 08/17/2021   Lab Results  Component Value Date   HDL 41.50 08/17/2021   Lab  Results  Component Value Date   LDLCALC 76 08/17/2021   Lab Results  Component Value Date   TRIG 74.0 08/17/2021   Lab Results  Component Value Date   CHOLHDL 3 08/17/2021   Lab Results  Component Value Date   HGBA1C 4.8 11/26/2020       Assessment & Plan:   Problem List Items Addressed This Visit       Unprioritized   Primary open-angle glaucoma (POAG), severe stage   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 Encouraged heart healthy diet such as the DASH diet and exercise as tolerated.       Relevant Medications   metoprolol succinate (TOPROL-XL) 25 MG 24 hr tablet   cloNIDine (CATAPRES) 0.1 MG tablet   Other Relevant Orders   CBC with Differential/Platelet   Comprehensive metabolic panel   Lipid panel   Vitamin B12   TSH   Other Visit Diagnoses     Mild neurocognitive disorder due to Alzheimer's disease ([place])    -  Primary   Relevant Medications   divalproex (DEPAKOTE) 125 MG DR tablet   Other Relevant Orders   CBC with Differential/Platelet   Comprehensive metabolic panel   Lipid panel   Vitamin B12   TSH   Primary hypertension       Relevant Medications   metoprolol succinate (TOPROL-XL) 25 MG 24 hr tablet   cloNIDine (CATAPRES) 0.1 MG tablet   Other Relevant Orders   CBC with Differential/Platelet   Comprehensive metabolic panel   Lipid panel   Vitamin B12   TSH   Need for varicella vaccine       Relevant Orders   Varicella-Zoster Virus Antibody (Immunity Screen), ACIF ,Serum   Hyperlipidemia, unspecified hyperlipidemia type       Relevant Medications   metoprolol succinate (TOPROL-XL) 25 MG 24 hr tablet   cloNIDine (CATAPRES) 0.1 MG tablet   Other Relevant Orders   CBC with Differential/Platelet   Comprehensive metabolic panel   Lipid  panel   Vitamin B12   TSH   Depression, unspecified depression type       Relevant Orders   CBC with Differential/Platelet   Comprehensive metabolic panel   Lipid panel   Vitamin B12   TSH        Meds ordered this encounter  Medications   metoprolol succinate (TOPROL-XL) 25 MG 24 hr tablet    Sig: Take 0.5 tablets (12.5 mg total) by mouth daily.  Dispense:  90 tablet    Refill:  1   cloNIDine (CATAPRES) 0.1 MG tablet    Sig: Take 1 tablet (0.1 mg total) by mouth 2 (two) times daily.    Dispense:  180 tablet    Refill:  1   divalproex (DEPAKOTE) 125 MG DR tablet    Sig: Take 1 tablet (125 mg total) by mouth 2 (two) times daily.    Dispense:  180 tablet    Refill:  1    I, [name], DO, personally preformed the services described in this documentation.  All medical record entries made by the scribe were at my direction and in my presence.  I have reviewed the chart and discharge instructions (if applicable) and agree that the record reflects my personal performance and is accurate and complete. 02/23/2022   I,[name] [name],acting as a Education administrator for [employer], DO.,have documented all relevant documentation on the behalf of [name], DO,as directed by  [name], DO while in the presence of [name], DO.   [name], DO

## 2022-02-23 NOTE — Patient Instructions (Signed)
Mild Neurocognitive Disorder Mild neurocognitive disorder, formerly known as mild cognitive impairment, is a disorder in which memory does not work as well as it should. This disorder may also cause problems with other mental functions, including thought, communication, behavior, and completion of tasks. These problems can be noticed and measured, but they usually do not interfere with daily activities or the ability to live independently. Mild neurocognitive disorder typically develops after 86 years of age, but it can also develop at younger ages. It is not as serious as major neurocognitive disorder, also known as dementia, but it may be the first sign of it. Generally, symptoms of this condition get worse over time. In rare cases, symptoms can get better. What are the causes? This condition may be caused by: Brain disorders like Alzheimer's disease, Parkinson's disease, and other conditions that gradually damage nerve cells (neurodegenerative conditions). Diseases that affect blood vessels in the brain and result in small strokes. Certain infections, such as HIV. Traumatic brain injury. Other medical conditions, such as brain tumors, underactive thyroid (hypothyroidism), and vitamin B12 deficiency. Use of certain drugs or prescription medicines. What increases the risk? The following factors may make you more likely to develop this condition: Being older than 65 years. Being male. Low education level. Diabetes, high blood pressure, high cholesterol, and other conditions that increase the risk for blood vessel diseases. Untreated or undertreated sleep apnea. Having a certain type of gene that can be passed from parent to child (inherited). Chronic health problems such as heart disease, lung disease, liver disease, kidney disease, or depression. What are the signs or symptoms? Symptoms of this condition include: Difficulty remembering. You may: Forget names, phone numbers, or details of  recent events. Forget social events and appointments. Repeatedly forget where you put your car keys or other items. Difficulty thinking and solving problems. You may have trouble with complex tasks, such as: Paying bills. Driving in unfamiliar places. Difficulty communicating. You may have trouble: Finding the right word or naming an object. Forming a sentence that makes sense, or understanding what you read or hear. Changes in your behavior or personality. When this happens, you may: Lose interest in the things that you used to enjoy. Withdraw from social situations. Get angry more easily than usual. Act before thinking. How is this diagnosed? This condition is diagnosed based on: Your symptoms. Your health care provider may ask you and the people you spend time with, such as family and friends, about your symptoms. Evaluation of mental functions (neuropsychological testing). Your health care provider may refer you to a neurologist or mental health specialist to evaluate your mental functions in detail. To identify the cause of your condition, your health care provider may: Get a detailed medical history. Ask about use of alcohol, drugs, and prescription medicines. Do a physical exam. Order blood tests and brain imaging exams. How is this treated? Mild neurocognitive disorder that is caused by medicine use, drug use, infection, or another medical condition may improve when the cause is treated, or when medicines or drugs are stopped. If this disorder has another cause, it generally does not improve and may get worse. In these cases, the goal of treatment is to help you manage the loss of mental function. Treatments in these cases include: Medicine. Medicine mainly helps memory and behavior symptoms. Talk therapy. Talk therapy provides education, emotional support, memory aids, and other ways of making up for problems with mental function. Lifestyle changes, including: Getting regular  exercise. Eating a healthy diet   that includes omega-3 fatty acids. Challenging your thinking and memory skills. Having more social interaction. Follow these instructions at home: Eating and drinking  Drink enough fluid to keep your urine pale yellow. Eat a healthy diet that includes omega-3 fatty acids. These can be found in: Fish. Nuts. Leafy vegetables. Vegetable oils. If you drink alcohol: Limit how much you use to: 0-1 drink a day for women. 0-2 drinks a day for men. Be aware of how much alcohol is in your drink. In the [place] one drink equals one 12 oz bottle of beer (355 mL), one 5 oz glass of wine (148 mL), or one 1 oz glass of hard liquor (44 mL). Lifestyle  Get regular exercise as told by your health care provider. Do not use any products that contain nicotine or tobacco, such as cigarettes, e-cigarettes, and chewing tobacco. If you need help quitting, ask your health care provider. Practice ways to manage stress. If you need help managing stress, ask your health care provider. Continue to have social interaction. Keep your mind active with stimulating activities you enjoy, such as reading or playing games. Make sure to get quality sleep. Follow these tips: Avoid napping during the day. Keep your sleeping area dark and cool. Avoid exercising during the few hours before you go to bed. Avoid caffeine products in the evening. General instructions Take over-the-counter and prescription medicines only as told by your health care provider. Your health care provider may recommend that you avoid taking medicines that can affect thinking, such as pain medicines or sleep medicines. Work with your health care provider to find out what you need help with and what your safety needs are. Keep all follow-up visits. This is important. Where to find more information National Institute on Aging: www.nia.nih.gov Contact a health care provider if: You have any new symptoms. Get help right  away if: You develop new confusion or your confusion gets worse. You act in ways that place you or your family in danger. Summary Mild neurocognitive disorder is a disorder in which memory does not work as well as it should. Mild neurocognitive disorder can have many causes. It may be the first stage of dementia. To manage your condition, get regular exercise, keep your mind active, get quality sleep, and eat a healthy diet. This information is not intended to replace advice given to you by your health care provider. Make sure you discuss any questions you have with your health care provider. Document Revised: 01/14/2020 Document Reviewed: 01/14/2020 Elsevier Patient Education  2023 [employer] Inc[employer]  

## 2022-02-23 NOTE — Assessment & Plan Note (Signed)
Per neuro 

## 2022-02-23 NOTE — Assessment & Plan Note (Signed)
Well controlled, no changes to meds. Encouraged heart healthy diet such as the DASH diet and exercise as tolerated.  °

## 2022-03-01 LAB — VARICELLA-ZOSTER VIRUS AB(IMMUNITY SCREEN),ACIF,SERUM: VARICELLA ZOSTER VIRUS AB (IMMUNITY SCR),ACIF SERUM: >=1:4 {titer}

## 2022-03-20 ENCOUNTER — Emergency Department (HOSPITAL_BASED_OUTPATIENT_CLINIC_OR_DEPARTMENT_OTHER): Payer: Federal, State, Local not specified - PPO

## 2022-03-20 ENCOUNTER — Ambulatory Visit (INDEPENDENT_AMBULATORY_CARE_PROVIDER_SITE_OTHER): Payer: Federal, State, Local not specified - PPO

## 2022-03-20 ENCOUNTER — Encounter: Payer: Self-pay | Admitting: Emergency Medicine

## 2022-03-20 ENCOUNTER — Encounter (HOSPITAL_BASED_OUTPATIENT_CLINIC_OR_DEPARTMENT_OTHER): Payer: Self-pay | Admitting: Emergency Medicine

## 2022-03-20 ENCOUNTER — Ambulatory Visit
Admission: EM | Admit: 2022-03-20 | Discharge: 2022-03-20 | Disposition: A | Discharge status: Home / Self Care | Payer: Federal, State, Local not specified - PPO | Attending: Urgent Care | Admitting: Urgent Care

## 2022-03-20 ENCOUNTER — Ambulatory Visit (HOSPITAL_COMMUNITY): Payer: Federal, State, Local not specified - PPO

## 2022-03-20 ENCOUNTER — Other Ambulatory Visit: Payer: Self-pay

## 2022-03-20 ENCOUNTER — Emergency Department (HOSPITAL_BASED_OUTPATIENT_CLINIC_OR_DEPARTMENT_OTHER)
Admission: EM | Admit: 2022-03-20 | Discharge: 2022-03-20 | Disposition: A | Discharge status: Home / Self Care | Payer: Federal, State, Local not specified - PPO | Attending: Emergency Medicine | Admitting: Emergency Medicine

## 2022-03-20 DIAGNOSIS — M545 Low back pain, unspecified: Secondary | ICD-10-CM | POA: Diagnosis not present

## 2022-03-20 DIAGNOSIS — Z23 Encounter for immunization: Secondary | ICD-10-CM | POA: Diagnosis not present

## 2022-03-20 DIAGNOSIS — I10 Essential (primary) hypertension: Secondary | ICD-10-CM

## 2022-03-20 DIAGNOSIS — I160 Hypertensive urgency: Secondary | ICD-10-CM | POA: Diagnosis not present

## 2022-03-20 DIAGNOSIS — F0390 Unspecified dementia without behavioral disturbance: Secondary | ICD-10-CM | POA: Insufficient documentation

## 2022-03-20 DIAGNOSIS — R410 Disorientation, unspecified: Secondary | ICD-10-CM | POA: Diagnosis not present

## 2022-03-20 DIAGNOSIS — W1789XA Other fall from one level to another, initial encounter: Secondary | ICD-10-CM | POA: Diagnosis not present

## 2022-03-20 DIAGNOSIS — M25561 Pain in right knee: Secondary | ICD-10-CM

## 2022-03-20 DIAGNOSIS — N3000 Acute cystitis without hematuria: Secondary | ICD-10-CM | POA: Insufficient documentation

## 2022-03-20 DIAGNOSIS — R778 Other specified abnormalities of plasma proteins: Secondary | ICD-10-CM | POA: Diagnosis not present

## 2022-03-20 DIAGNOSIS — E876 Hypokalemia: Secondary | ICD-10-CM | POA: Insufficient documentation

## 2022-03-20 DIAGNOSIS — S80211A Abrasion, right knee, initial encounter: Secondary | ICD-10-CM | POA: Diagnosis not present

## 2022-03-20 DIAGNOSIS — Y9339 Activity, other involving climbing, rappelling and jumping off: Secondary | ICD-10-CM | POA: Diagnosis not present

## 2022-03-20 DIAGNOSIS — W19XXXA Unspecified fall, initial encounter: Secondary | ICD-10-CM

## 2022-03-20 DIAGNOSIS — S8991XA Unspecified injury of right lower leg, initial encounter: Secondary | ICD-10-CM | POA: Diagnosis present

## 2022-03-20 LAB — CBC
HCT: 36.4 % — ABNORMAL LOW (ref 39.0–52.0)
Hemoglobin: 12.1 g/dL — ABNORMAL LOW (ref 13.0–17.0)
MCH: 30.9 pg (ref 26.0–34.0)
MCHC: 33.2 g/dL (ref 30.0–36.0)
MCV: 92.9 fL (ref 80.0–100.0)
Platelets: 177 10*3/uL (ref 150–400)
RBC: 3.92 MIL/uL — ABNORMAL LOW (ref 4.22–5.81)
RDW: 12.8 % (ref 11.5–15.5)
WBC: 8.7 10*3/uL (ref 4.0–10.5)
nRBC: 0 % (ref 0.0–0.2)

## 2022-03-20 LAB — BASIC METABOLIC PANEL
Anion gap: 5 (ref 5–15)
BUN: 17 mg/dL (ref 8–23)
CO2: 25 mmol/L (ref 22–32)
Calcium: 8.6 mg/dL — ABNORMAL LOW (ref 8.9–10.3)
Chloride: 110 mmol/L (ref 98–111)
Creatinine, Ser: 1.32 mg/dL — ABNORMAL HIGH (ref 0.61–1.24)
GFR, Estimated: 52 mL/min — ABNORMAL LOW (ref >60)
Glucose, Bld: 98 mg/dL (ref 70–99)
Potassium: 3.1 mmol/L — ABNORMAL LOW (ref 3.5–5.1)
Sodium: 140 mmol/L (ref 135–145)

## 2022-03-20 LAB — URINALYSIS, ROUTINE W REFLEX MICROSCOPIC
Bilirubin Urine: NEGATIVE
Glucose, UA: NEGATIVE mg/dL
Ketones, ur: NEGATIVE mg/dL
Nitrite: NEGATIVE
Protein, ur: NEGATIVE mg/dL
Specific Gravity, Urine: 1.015 (ref 1.005–1.030)
pH: 7 (ref 5.0–8.0)

## 2022-03-20 LAB — URINALYSIS, MICROSCOPIC (REFLEX): WBC, UA: >50 WBC/hpf (ref 0–5)

## 2022-03-20 LAB — TROPONIN I (HIGH SENSITIVITY)
Troponin I (High Sensitivity): 83 ng/L — ABNORMAL HIGH (ref <18)
Troponin I (High Sensitivity): 85 ng/L — ABNORMAL HIGH (ref <18)

## 2022-03-20 MED ORDER — TETANUS-DIPHTH-ACELL PERTUSSIS 5-2.5-18.5 LF-MCG/0.5 IM SUSY
0.5000 mL | PREFILLED_SYRINGE | Freq: Once | INTRAMUSCULAR | Status: AC
  Administered 2022-03-20: 0.5 mL via INTRAMUSCULAR
  Filled 2022-03-20: qty 0.5

## 2022-03-20 MED ORDER — LORAZEPAM 2 MG/ML IJ SOLN
1.0000 mg | INTRAMUSCULAR | Status: DC | PRN
Start: 2022-03-20 — End: 2022-03-20

## 2022-03-20 MED ORDER — CEPHALEXIN 500 MG PO CAPS: 500.0000 mg | ORAL_CAPSULE | Freq: Four times a day (QID) | ORAL | 0 refills | Status: DC

## 2022-03-20 MED ORDER — SODIUM CHLORIDE 0.9 % IV SOLN
1.0000 g | Freq: Once | INTRAVENOUS | Status: AC
  Administered 2022-03-20: 1 g via INTRAVENOUS
  Filled 2022-03-20: qty 10

## 2022-03-20 MED ORDER — LORAZEPAM 2 MG/ML IJ SOLN
1.0000 mg | INTRAMUSCULAR | Status: DC | PRN
Start: 2022-03-20 — End: 2022-03-20
  Administered 2022-03-20 (×2): 1 mg via INTRAVENOUS
  Filled 2022-03-20 (×2): qty 1

## 2022-03-20 MED ORDER — ACETAMINOPHEN 325 MG PO TABS: 650.0000 mg | ORAL_TABLET | Freq: Four times a day (QID) | ORAL | 0 refills | Status: AC | PRN

## 2022-03-20 MED ORDER — SODIUM CHLORIDE 0.9 % IV SOLN: INTRAVENOUS | Status: DC | PRN

## 2022-03-20 MED ORDER — HYDROCODONE-ACETAMINOPHEN 5-325 MG PO TABS
1.0000 | ORAL_TABLET | Freq: Once | ORAL | Status: AC
  Administered 2022-03-20: 1 via ORAL
  Filled 2022-03-20: qty 1

## 2022-03-20 MED ORDER — LABETALOL HCL 5 MG/ML IV SOLN
10.0000 mg | Freq: Once | INTRAVENOUS | Status: AC
  Administered 2022-03-20: 10 mg via INTRAVENOUS
  Filled 2022-03-20: qty 4

## 2022-03-20 NOTE — ED Notes (Signed)
Pt with mild agitation and confusion at baseline per family,  multiple attempts to get out of bed and pull out IV and monitoring equipment.  Provider made aware, ordered ativan needed to complete MRI.

## 2022-03-20 NOTE — ED Notes (Signed)
Patient transported to MRI, cooperative with transfer to wheelchair.

## 2022-03-20 NOTE — ED Triage Notes (Signed)
Pt arrives pov, to triage in wheelchair, endorses jump from 27f high window, referred by UC. Denies thinners, denies loc. Pts wife reports right knee injury, sent for CT

## 2022-03-20 NOTE — Discharge Instructions (Signed)
You have been evaluated in the ED for your symptoms.  It appears you are experiencing increased confusion likely due to an ongoing urinary tract infection.  Your blood pressure is high today causing stress to your heart.  We recommend for you to be In the hospital but understand your desire to go home.  Please take antibiotic as prescribed, follow-up closely with your doctor for reassessment but do not hesitate to return to the ER if your symptoms worsen or if you have any concern.

## 2022-03-20 NOTE — ED Notes (Signed)
Patient transported to MRI 

## 2022-03-20 NOTE — ED Provider Notes (Signed)
[facility] Provider Note   CSN: [id number] Arrival date & time: 03/20/22  1124     History  Chief Complaint  Patient presents with   [name] [name] is a 88 y.o. male.  The history is provided by the spouse and medical records. No language interpreter was used.  Fall    88 year old male sent here from urgent care center for evaluation of fall.  Patient has history of dementia and apparently jumped out of a window that is approximately 6 feet high.  Reportedly landed on his feet without any head injury or loss of consciousness.  Neighbors found him wandering outside the house.  Wife mention patient has history of leaving the house and therefore they normally lock his door and he has a security alarm.  However he apparently jumped out of his window at 6 AM this morning.  Patient initially brought to the urgent care center today for evaluation.  X-ray performed and right knee x-ray is concerning for potential tibial plateau fracture.  Patient sent here for further assessment.  At this time patient without any specific complaints specifically no headache neck pain chest pain trouble breathing abdominal pain back pain hip pain knee pain foot pain or any other pain.  He is not on any blood thinner medication.  He is a poor historian.  Additional history obtained through wife at bedside.  No recent medication changes, no recent sickness.  No urinary symptoms.  Home Medications Prior to Admission medications   Medication Sig Start Date End Date Taking? Authorizing Provider  acetaminophen (TYLENOL) 325 MG tablet Take 2 tablets (650 mg total) by mouth every 6 (six) hours as needed for moderate pain. 03/20/22   Jaynee Eagles, PA-C  brimonidine (ALPHAGAN) 0.2 % ophthalmic solution Place 1 drop into both eyes in the morning and at bedtime.    [provider]  cloNIDine (CATAPRES) 0.1 MG tablet Take 1 tablet (0.1 mg total) by mouth 2 (two) times daily.  02/23/22   Roma Schanz R, DO  divalproex (DEPAKOTE) 125 MG DR tablet Take 1 tablet (125 mg total) by mouth 2 (two) times daily. 02/23/22   Roma Schanz R, DO  dorzolamide-timolol (COSOPT) 22.3-6.8 MG/ML ophthalmic solution Place 1 drop into both eyes 2 (two) times daily.    [provider]  metoprolol succinate (TOPROL-XL) 25 MG 24 hr tablet Take 0.5 tablets (12.5 mg total) by mouth daily. 02/23/22   [name], DO  sertraline (ZOLOFT) 25 MG tablet Take 1 tablet (25 mg total) by mouth daily. 01/01/22   Rondel Jumbo, PA-C      Allergies    Latanoprost and Netarsudil    Review of Systems   Review of Systems  Unable to perform ROS: Dementia    Physical Exam Updated Vital Signs BP (!) 214/112 (BP Location: Right Arm)   Pulse 65   Temp 98.1 F (36.7 C) (Oral)   Resp 20   Ht '5\' 8"'$  (1.727 m)   Wt 61.2 kg   SpO2 100%   BMI 20.53 kg/m  Physical Exam Vitals and nursing note reviewed.  Constitutional:      General: He is not in acute distress.    Appearance: He is well-developed.  HENT:     Head: Normocephalic and atraumatic.     Comments: No scalp tenderness no signs of head trauma Eyes:     Conjunctiva/sclera: Conjunctivae normal.  Cardiovascular:     Rate  and Rhythm: Normal rate and regular rhythm.     Pulses: Normal pulses.     Heart sounds: Normal heart sounds.  Pulmonary:     Breath sounds: Normal breath sounds.  Musculoskeletal:        General: Signs of injury (Right knee: Small abrasion noted to the anterior knee with normal flexion extension and no deformity noted.) present.     Cervical back: Normal range of motion and neck supple. No tenderness.     Comments: No tenderness along midline spine no crepitus or step-off  No tenderness to bilateral feet including the heels.  No ankle tenderness.  Skin:    Findings: No rash.  Neurological:     Mental Status: He is alert.     Comments: Alert to self only, unable to tell place time  situation or current event.  Appears to be in no acute discomfort     ED Results / Procedures / Treatments   Labs (all labs ordered are listed, but only abnormal results are displayed) Labs Reviewed  BASIC METABOLIC PANEL - Abnormal; Notable for the following components:      Result Value   Potassium 3.1 (*)    Creatinine, Ser 1.32 (*)    Calcium 8.6 (*)    GFR, Estimated 52 (*)    All other components within normal limits  CBC - Abnormal; Notable for the following components:   RBC 3.92 (*)    Hemoglobin 12.1 (*)    HCT 36.4 (*)    All other components within normal limits  URINALYSIS, ROUTINE W REFLEX MICROSCOPIC - Abnormal; Notable for the following components:   Hgb urine dipstick MODERATE (*)    Leukocytes,Ua LARGE (*)    All other components within normal limits  URINALYSIS, MICROSCOPIC (REFLEX) - Abnormal; Notable for the following components:   Bacteria, UA MANY (*)    All other components within normal limits  TROPONIN I (HIGH SENSITIVITY) - Abnormal; Notable for the following components:   Troponin I (High Sensitivity) 83 (*)    All other components within normal limits  TROPONIN I (HIGH SENSITIVITY) - Abnormal; Notable for the following components:   Troponin I (High Sensitivity) 85 (*)    All other components within normal limits  URINE CULTURE    EKG None ED ECG REPORT   Date: 03/20/2022  Rate: 78  Rhythm: normal sinus rhythm  QRS Axis: normal  Intervals: normal  ST/T Wave abnormalities: nonspecific ST changes  Conduction Disutrbances:none  Narrative Interpretation: LVH  Old EKG Reviewed: unchanged  I have personally reviewed the EKG tracing and agree with the computerized printout as noted.   Radiology MR BRAIN WO CONTRAST  Result Date: 03/20/2022 CLINICAL DATA:  Neuro deficit, acute, stroke suspected EXAM: MRI HEAD WITHOUT CONTRAST TECHNIQUE: Multiplanar, multiecho pulse sequences of the brain and surrounding structures were obtained without  intravenous contrast. COMPARISON:  MRI head November 26, 2020. FINDINGS: Motion limited study. Brain: No acute infarction, hemorrhage, hydrocephalus, extra-axial collection or mass lesion. Remote right occipital infarct with encephalomalacia and surrounding gliosis. Cerebral atrophy. Vascular: Major arterial flow voids are maintained at the skull base. Skull and upper cervical spine: Normal marrow signal. Sinuses/Orbits: Clear sinuses.  No acute orbital findings. Other: No mastoid effusions. IMPRESSION: 1. No evidence of f acute intracranial abnormality. 2. Remote right occipital infarct. 3.  Cerebral Atrophy (ICD10-G31.9). Electronically Signed   By: Margaretha Sheffield M.D.   On: 03/20/2022 17:03   CT Head Wo Contrast  Result Date: 03/20/2022 CLINICAL DATA:  Blunt trauma. EXAM: CT HEAD WITHOUT CONTRAST TECHNIQUE: Contiguous axial images were obtained from the base of the skull through the vertex without intravenous contrast. RADIATION DOSE REDUCTION: This exam was performed according to the departmental dose-optimization program which includes automated exposure control, adjustment of the mA and/or kV according to patient size and/or use of iterative reconstruction technique. COMPARISON:  November 25, 2020 FINDINGS: Brain: No evidence of acute hemorrhage, hydrocephalus, extra-axial collection or mass lesion/mass effect. Area of hypoattenuation in the right occipital lobe involves cortical and subcortical matter. No visible surrounding brain parenchymal edema. Moderate brain parenchymal volume loss and deep white matter microangiopathy. Vascular: No hyperdense vessel or unexpected calcification. Skull: Normal. Negative for fracture or focal lesion. Sinuses/Orbits: Status post bilateral ocular lens replacement. Paranasal sinuses and mastoid air cells are clear. Other: None. IMPRESSION: 1. Area of hypoattenuation in the right occipital lobe involves cortical and subcortical matter. No visible surrounding brain parenchymal  edema. This may represent an area of age-indeterminate infarct. 2. Moderate brain parenchymal volume loss and deep white matter microangiopathy. Electronically Signed   By: Fidela Salisbury M.D.   On: 03/20/2022 12:55   CT Knee Right Wo Contrast  Result Date: 03/20/2022 CLINICAL DATA:  Right knee pain after jumping out of a 6 foot high window. EXAM: CT OF THE RIGHT KNEE WITHOUT CONTRAST TECHNIQUE: Multidetector CT imaging of the right knee was performed according to the standard protocol. Multiplanar CT image reconstructions were also generated. RADIATION DOSE REDUCTION: This exam was performed according to the departmental dose-optimization program which includes automated exposure control, adjustment of the mA and/or kV according to patient size and/or use of iterative reconstruction technique. COMPARISON:  Right knee x-rays from same day. FINDINGS: Bones/Joint/Cartilage No fracture or dislocation. Tricompartmental degenerative changes with subchondral sclerosis, cystic change, and marginal osteophytes. Mild-to-moderate medial and patellofemoral compartment joint space narrowing. 9 mm intra-articular body in the posterior joint space (series 4, image 111). Osteopenia. No joint effusion. Ligaments Ligaments are suboptimally evaluated by CT. Muscles and Tendons Grossly intact. Soft tissue Mild prepatellar soft tissue swelling. No fluid collection or hematoma. No soft tissue mass. IMPRESSION: 1. No acute osseous abnormality. 2. Tricompartmental osteoarthritis. Electronically Signed   By: Titus Dubin M.D.   On: 03/20/2022 12:48   DG Knee Complete 4 Views Right  Result Date: 03/20/2022 CLINICAL DATA:  Right knee pain EXAM: RIGHT KNEE - COMPLETE 4+ VIEW COMPARISON:  None Available. FINDINGS: There is grid line artifact present which limits evaluation. Tricompartment osteophyte formation with and mild-to-moderate medial compartment joint space narrowing. There is evidence of osteochondral defect or fracture  of the medial femoral condyle. There is a trace joint effusion. There is a possible nondisplaced medial tibial plateau fracture. Vascular calcifications. IMPRESSION: Possible nondisplaced medial tibial plateau fracture and osteochondral defect/fracture of the medial femoral condyle. Recommend CT. Tricompartment osteoarthritis with mild to moderate medial compartment joint space narrowing. Electronically Signed   By: Maurine Simmering M.D.   On: 03/20/2022 09:51   DG Lumbar Spine Complete  Result Date: 03/20/2022 CLINICAL DATA:  low back pain, right knee pain EXAM: LUMBAR SPINE - COMPLETE 4+ VIEW COMPARISON:  CT 01/13/2010 FINDINGS: There are 5 non-rib-bearing lumbar vertebrae. There is no evidence of lumbar spine fracture. There is mild multilevel degenerative disc disease, most prominent at L5-S1. There is mild to moderate lower lumbar predominant facet arthropathy. Vascular calcifications. IMPRESSION: No evidence of lumbar spine fracture. Mild multilevel degenerative disc disease worst at L5-S1. Mild to moderate lower lumbar predominant facet arthropathy. Electronically  Signed   By: Maurine Simmering M.D.   On: 03/20/2022 09:42    Procedures .Critical Care  Performed by: Domenic Moras, PA-C Authorized by: Domenic Moras, PA-C   Critical care provider statement:    Critical care time (minutes):  30   Critical care was time spent personally by me on the following activities:  Development of treatment plan with patient or surrogate, discussions with consultants, evaluation of patient's response to treatment, examination of patient, ordering and review of laboratory studies, ordering and review of radiographic studies, ordering and performing treatments and interventions, pulse oximetry, re-evaluation of patient's condition and review of old charts     Medications Ordered in ED Medications  LORazepam (ATIVAN) injection 1 mg (1 mg Intravenous Given 03/20/22 1437)  0.9 %  sodium chloride infusion (0 mLs Intravenous  Stopped 03/20/22 1531)  LORazepam (ATIVAN) injection 1 mg (has no administration in time range)  HYDROcodone-acetaminophen (NORCO/VICODIN) 5-325 MG per tablet 1 tablet (1 tablet Oral Given 03/20/22 1221)  Tdap (BOOSTRIX) injection 0.5 mL (0.5 mLs Intramuscular Given 03/20/22 1221)  labetalol (NORMODYNE) injection 10 mg (10 mg Intravenous Given 03/20/22 1316)  cefTRIAXone (ROCEPHIN) 1 g in sodium chloride 0.9 % 100 mL IVPB (0 g Intravenous Stopped 03/20/22 1531)    ED Course/ Medical Decision Making/ A&P                           Medical Decision Making Amount and/or Complexity of Data Reviewed Labs: ordered. Radiology: ordered.  Risk Prescription drug management.   BP (!) 214/112 (BP Location: Right Arm)   Pulse 65   Temp 98.1 F (36.7 C) (Oral)   Resp 20   Ht '5\' 8"'$  (1.727 m)   Wt 61.2 kg   SpO2 100%   BMI 20.53 kg/m   11:59 AM This is an 88 year old male with known history of dementia and history of wandering outside of his house who apparently jumped out of his window from his room at 6 AM this morning and was wandering around his house.  He was initially seen at urgent care center for evaluation and an x-ray of his right knee obtained showing possible tibial plateau fracture.  This is the only area that he voiced complaints of pain.  He was able to ambulate.  Patient does not have any signs of headache neck pain or any other injury noted on exam.  He is not on any blood thinner medication.  He is in no acute discomfort.  Does not have any tenderness to his lower back or his feet.  He was noted to be hypertensive with a blood pressure of 214/112.  Wife reports that patient did take his blood pressure medication this morning.  Given history of dementia and his age, will obtain labs, and obtain head CT scan along with CT of his right knee for further assessment.  Unsure last tetanus status therefore I will update his tetanus.  1:02 PM An EKG was obtained independently viewed interpreted by  me which shows evidence of LVH.  This is unchanged from prior EKG.  Due to patient's markedly elevated blood pressure, troponin was obtained and is elevated at 80 suggestive of demand ischemia as patient does not endorse any chest discomfort or shortness of breath.  Labs and imaging obtained independently viewed interpreted by me and I agree with radiologist interpretation.  Patient does have mild hypokalemia with a potassium of 3.1, supplementation given.  Creatinine is 1.32 improved from  prior.  Head CT scan shows an area of age-indeterminate infarct but no acute abnormalities.  CT scan of the right knee shows mild prepatellar soft tissue swelling without any acute osseous abnormalities.  Osteoarthritis noted.  Due to elevated blood pressure, elevated troponin, I have given the patient labetalol 10 mg to help with reductions of his blood pressure.  He does not have any focal neurodeficit to suggest acute stroke.  5:13 PM Second troponin is 85, slightly flat compared to previous value but not a significant drop.  Blood pressure is improving.  Urinalysis consistent with UTI patient was given Rocephin as treatment.  And brain MRI obtained show no acute stroke.  Patient has a remote right occipital stroke.  I discussed this finding with patient and with wife.  I recommend admission for further management of his UTI likely causing delirium and increased infusion thus leading up to his action of jumping out of the window.  Also mentioned about elevated troponin which could be due to demand ischemia from elevated blood pressure however cannot rule out ACS.  Wife mentioned that patient does not do well in the hospital setting due to his increased dementia and delirium and she felt she would much prefer to have him home taking antibiotic and follow-up with [place] Dr. Instead.  After further discussion and shared decision making, plan to discharge patient home with Keflex as treatment for his UTI and strong  recommendation to follow-up outpatient or to return if symptoms worsen.  I have reviewed patient is EMR and considered in my plan of care.  I have also considered patient's social determinant of health including lack of physical activity and considering my plan of care.  I have discussed care with DR. Rancour.         Final Clinical Impression(s) / ED Diagnoses Final diagnoses:  Acute cystitis without hematuria  Delirium  Elevated troponin  Abrasion of right knee, initial encounter  Fall in home, initial encounter  Hypertensive urgency    Rx / DC Orders ED Discharge Orders          Ordered    cephALEXin (KEFLEX) 500 MG capsule  4 times daily        03/20/22 1721              Domenic Moras, PA-C 03/20/22 1722    Rancour, Annie Main, MD 03/21/22 [phone]

## 2022-03-20 NOTE — ED Triage Notes (Signed)
Pt here after jumping out of a window 6 ft high. His right knee and back is hurting. Pt is not on blood thinners and has Alzheimer's, so this is his baseline. Pt did not hit head and no LOC.

## 2022-03-20 NOTE — ED Notes (Signed)
Patient transported to CT 

## 2022-03-20 NOTE — ED Provider Notes (Signed)
[name] [name]   MRN: [id number] DOB: [birth year]  Subjective:   [name] [name] is a 88 y.o. male presenting for suffering right knee pain and low back pain.  Patient unfortunately has a history of dementia and jumped out of a window that was about 6 feet high.  He landed on his feet without causing a head injury, losing consciousness.  No headache, chest pain, confusion.  Since this incident he has had some low back pain across the low back and some milder right knee pain.  Patient is able to bear weight and walk but with mostly mild pain of the low back and right knee.  Regarding his blood pressure, patient took his medication just prior to coming into the clinic.  No current facility-administered medications for this encounter.  Current Outpatient Medications:    brimonidine (ALPHAGAN) 0.2 % ophthalmic solution, Place 1 drop into both eyes in the morning and at bedtime., Disp: , Rfl:    cloNIDine (CATAPRES) 0.1 MG tablet, Take 1 tablet (0.1 mg total) by mouth 2 (two) times daily., Disp: 180 tablet, Rfl: 1   divalproex (DEPAKOTE) 125 MG DR tablet, Take 1 tablet (125 mg total) by mouth 2 (two) times daily., Disp: 180 tablet, Rfl: 1   dorzolamide-timolol (COSOPT) 22.3-6.8 MG/ML ophthalmic solution, Place 1 drop into both eyes 2 (two) times daily., Disp: , Rfl:    metoprolol succinate (TOPROL-XL) 25 MG 24 hr tablet, Take 0.5 tablets (12.5 mg total) by mouth daily., Disp: 90 tablet, Rfl: 1   sertraline (ZOLOFT) 25 MG tablet, Take 1 tablet (25 mg total) by mouth daily., Disp: 90 tablet, Rfl: 1   Allergies  Allergen Reactions   Latanoprost Other (See Comments)    Eye irritation   Netarsudil Other (See Comments)    Eye burning and irritation    Past Medical History:  Diagnosis Date   Abdominal pain 05/19/2010   Abnormal EKG 04/10/2009   Acute cholecystitis 05/22/2010   BPH without obstruction/lower urinary tract symptoms 12/08/2006   Bradycardia 04/21/2012    Coronary atherosclerosis of native coronary artery 04/07/2010   Disorder resulting from impaired renal function 04/07/2007   Elevated PSA    Essential hypertension 12/08/2006   Hearing loss 12/08/2006   no utilized hearing aids   Hip pain, left 04/29/2008   Hyperlipidemia 12/08/2006   LDL goal <100   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; chronic left thalamic   Mild neurocognitive disorder due to Alzheimer's disease, possible 09/26/2020   Primary open-angle glaucoma, severe stage 12/29/2006   Renal insufficiency    Weight loss observed 06/19/2008     Past Surgical History:  Procedure Laterality Date   BLADDER SURGERY     CATARACT EXTRACTION     CHOLECYSTECTOMY  03/2010   [place]   COLONOSCOPY     removal of cyst     right arm   SHOULDER SURGERY     cyst removed left shoulder   TONSILECTOMY, ADENOIDECTOMY, BILATERAL MYRINGOTOMY AND TUBES     TRANSURETHRAL RESECTION OF PROSTATE      Family History  Problem Relation Age of Onset   Coronary artery disease Mother    Hypertension Mother    Heart disease Mother    Diabetes Sister    Heart disease Sister 12       died during cabg   Hypertension Daughter    Hypertension Son    Alzheimer's disease Father     Social History  Tobacco Use   Smoking status: Former    Packs/day: 0.30    Years: 20.00    Total pack years: 6.00    Types: Cigarettes    Quit date: 02/22/1993    Years since quitting: 29.0   Smokeless tobacco: Never  Vaping Use   Vaping Use: Never used  Substance Use Topics   Alcohol use: No   Drug use: Not Currently    Types: Marijuana    Comment: past marijuana use; none currently    ROS   Objective:   Vitals: BP (!) 200/97   Pulse 64   Temp 98.2 F (36.8 C)   Resp 20   SpO2 98%   Physical Exam Constitutional:      General: He is not in acute distress.    Appearance: Normal appearance. He is well-developed and normal weight. He is not ill-appearing, toxic-appearing or  diaphoretic.  HENT:     Head: Normocephalic and atraumatic.     Right Ear: External ear normal.     Left Ear: External ear normal.     Nose: Nose normal.     Mouth/Throat:     Pharynx: Oropharynx is clear.  Eyes:     General: No scleral icterus.       Right eye: No discharge.        Left eye: No discharge.     Extraocular Movements: Extraocular movements intact.  Cardiovascular:     Rate and Rhythm: Normal rate.  Pulmonary:     Effort: Pulmonary effort is normal.  Musculoskeletal:     Cervical back: Normal range of motion.     Lumbar back: Tenderness (across the areas outlined) present. No swelling, edema, deformity, signs of trauma, lacerations, spasms or bony tenderness. Normal range of motion. Negative right straight leg raise test and negative left straight leg raise test. No scoliosis.       Back:     Right knee: Bony tenderness present. No swelling, deformity, effusion, erythema, ecchymosis, lacerations or crepitus. Normal range of motion. Tenderness (mild) present. No medial joint line, lateral joint line or patellar tendon tenderness. Normal alignment and normal patellar mobility.  Neurological:     Mental Status: He is alert and oriented to person, place, and time.  Psychiatric:        Mood and Affect: Mood normal.        Behavior: Behavior normal.        Thought Content: Thought content normal.        Judgment: Judgment normal.    DG Knee Complete 4 Views Right  Result Date: 03/20/2022 CLINICAL DATA:  Right knee pain EXAM: RIGHT KNEE - COMPLETE 4+ VIEW COMPARISON:  None Available. FINDINGS: There is grid line artifact present which limits evaluation. Tricompartment osteophyte formation with and mild-to-moderate medial compartment joint space narrowing. There is evidence of osteochondral defect or fracture of the medial femoral condyle. There is a trace joint effusion. There is a possible nondisplaced medial tibial plateau fracture. Vascular calcifications. IMPRESSION:  Possible nondisplaced medial tibial plateau fracture and osteochondral defect/fracture of the medial femoral condyle. Recommend CT. Tricompartment osteoarthritis with mild to moderate medial compartment joint space narrowing. Electronically Signed   By: Maurine Simmering M.D.   On: 03/20/2022 09:51   DG Lumbar Spine Complete  Result Date: 03/20/2022 CLINICAL DATA:  low back pain, right knee pain EXAM: LUMBAR SPINE - COMPLETE 4+ VIEW COMPARISON:  CT 01/13/2010 FINDINGS: There are 5 non-rib-bearing lumbar vertebrae. There is no evidence of lumbar spine  fracture. There is mild multilevel degenerative disc disease, most prominent at L5-S1. There is mild to moderate lower lumbar predominant facet arthropathy. Vascular calcifications. IMPRESSION: No evidence of lumbar spine fracture. Mild multilevel degenerative disc disease worst at L5-S1. Mild to moderate lower lumbar predominant facet arthropathy. Electronically Signed   By: Maurine Simmering M.D.   On: 03/20/2022 09:42    Assessment and Plan :   PDMP not reviewed this encounter.  1. Acute bilateral low back pain without sciatica   2. Acute pain of right knee   3. Essential hypertension    X-ray over-read for the right knee was pending at time of discharge, recommended follow up with only abnormal results. Otherwise will not call for negative over-read.  Patient and his wife were in agreement was in agreement. Use Tylenol for pain relief. Counseled patient on potential for adverse effects with medications prescribed/recommended today, ER and return-to-clinic precautions discussed, patient verbalized understanding.   UPDATE: I discussed possibility of a right tibial plateau fracture.  Recommended immobilizer but patient's wife declined.  Discussed the recommendation to pursue the CT scan and she prefers to go straight to the emergency room now for further evaluation.  I advised that the patient be nonweightbearing and that she had requested assistance through the  use of a wheelchair to get him into the emergency room.  She will head straight there now.   Jaynee Eagles, [place] 03/20/22 1042

## 2022-03-20 NOTE — ED Notes (Signed)
Pt was able to stand at bedside with assistance to use the urinal. Pt was unsteady without support by staff. Pt had 2 staff members at bedside to assist Pt.

## 2022-03-22 LAB — URINE CULTURE: Culture: >=100000 — AB

## 2022-03-23 ENCOUNTER — Telehealth: Payer: Self-pay | Admitting: *Deleted

## 2022-03-23 NOTE — Progress Notes (Signed)
ED Antimicrobial Stewardship Positive Culture Follow Up   [name] [name] is an 88 y.o. male who presented to [facility] on 03/20/2022 with a chief complaint of  Chief Complaint  Patient presents with   Fall    Recent Results (from the past 83 hour(s))  Urine Culture     Status: Abnormal   Collection Time: 03/20/22  1:10 PM   Specimen: Urine, Random  Result Value Ref Range Status   Specimen Description   Final    URINE, RANDOM Performed at [facility], [address] [place], [place] [address]    Special Requests   Final    NONE Performed at [facility], [address] [place], [place] [address]    Culture >=100,000 COLONIES/mL [facility] MORGANII (A)  Final   Report Status 03/22/2022 FINAL  Final   Organism ID, Bacteria MORGANELLA MORGANII (A)  Final      Susceptibility   Morganella morganii - MIC*    AMPICILLIN >=32 RESISTANT Resistant     CEFAZOLIN >=64 RESISTANT Resistant     CIPROFLOXACIN <=0.25 SENSITIVE Sensitive     GENTAMICIN <=1 SENSITIVE Sensitive     IMIPENEM 4 SENSITIVE Sensitive     NITROFURANTOIN 128 RESISTANT Resistant     TRIMETH/SULFA <=20 SENSITIVE Sensitive     AMPICILLIN/SULBACTAM >=32 RESISTANT Resistant     PIP/TAZO >=128 RESISTANT Resistant     * >=100,000 COLONIES/mL MORGANELLA MORGANII    '[x]'$  Treated with cephalexin, organism resistant to prescribed antimicrobial  New antibiotic prescription: Stop cephalexin. Start cipro '250mg'$  PO q12h x 5 days.  ED Provider: Lacretia Leigh, MD   [place] 03/23/2022, 10:43 AM Clinical Pharmacist Monday - Friday phone -  [phone] Saturday - Sunday phone - [phone]

## 2022-03-23 NOTE — Telephone Encounter (Signed)
Post ED Visit - Positive Culture Follow-up: Successful Patient Follow-Up  Culture assessed and recommendations reviewed by:  '[]'$  Elenor Quinones, Pharm.D. '[]'$  Heide Guile, Pharm.D., BCPS AQ-ID '[]'$  Parks Neptune, Pharm.D., BCPS '[]'$  Alycia Rossetti, Pharm.D., BCPS '[]'$  B[place], Pharm.D., BCPS, AAHIVP '[]'$  Legrand Como, Pharm.D., BCPS, AAHIVP '[]'$  Salome Arnt, PharmD, BCPS '[]'$  Johnnette Gourd, PharmD, BCPS '[]'$  Hughes Better, PharmD, BCPS '[]'$  Leeroy Cha, PharmD  Positive urine culture  '[]'$  Patient discharged without antimicrobial prescription and treatment is now indicated '[x]'$  Organism is resistant to prescribed ED discharge antimicrobial '[]'$  Patient with positive blood cultures  Changes discussed with ED provider: Lacretia Leigh, [facility] New antibiotic prescription Cipro '250mg'$  PO q12hrs x 5 days, stop Keflex Called to [place]  Contacted patient, date 03/23/2022, time [place], [place] 03/23/2022, 11:17 AM

## 2022-03-30 ENCOUNTER — Other Ambulatory Visit: Payer: Self-pay

## 2022-03-30 ENCOUNTER — Telehealth: Payer: Self-pay | Admitting: Physician Assistant

## 2022-03-30 DIAGNOSIS — F0280 Dementia in other diseases classified elsewhere without behavioral disturbance: Secondary | ICD-10-CM

## 2022-03-30 NOTE — Telephone Encounter (Signed)
Awaiting referral out

## 2022-03-30 NOTE — Telephone Encounter (Signed)
Can we try and get home health to evaluate him, she is going to look into ALF.

## 2022-03-30 NOTE — Telephone Encounter (Signed)
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   She said he is trying to go home but he is at home. He went out in the middle of the night and jumped over a 6 foot fence and hurt his knee the other night. He was taken to urgent care and then the ED.  She is very concerned about his well-being and she hasn't slept herself in 4 nights due to the disruptions going on. Mrs. Gautier is open to a visit with a Education officer, museum, if possible.  She'd like Kennis Carina recommendations and assistance with the patient.

## 2022-03-30 NOTE — Telephone Encounter (Signed)
Sent referral to [employer] at [place]  [phone]

## 2022-03-31 ENCOUNTER — Telehealth: Payer: Self-pay | Admitting: Family Medicine

## 2022-03-31 NOTE — Telephone Encounter (Signed)
Verbal given. Orders to start on 04/05/2022

## 2022-03-31 NOTE — Telephone Encounter (Signed)
[name]) called stating pt's wife was requesting start of Skilled Nursing, PT, and speech therapy.

## 2022-04-05 ENCOUNTER — Emergency Department (HOSPITAL_COMMUNITY): Payer: Federal, State, Local not specified - PPO

## 2022-04-05 ENCOUNTER — Other Ambulatory Visit: Payer: Self-pay

## 2022-04-05 ENCOUNTER — Emergency Department (HOSPITAL_COMMUNITY)
Admission: EM | Admit: 2022-04-05 | Discharge: 2022-04-05 | Disposition: A | Discharge status: Home / Self Care | Payer: Federal, State, Local not specified - PPO | Attending: Emergency Medicine | Admitting: Emergency Medicine

## 2022-04-05 ENCOUNTER — Encounter (HOSPITAL_COMMUNITY): Payer: Self-pay

## 2022-04-05 DIAGNOSIS — S0990XA Unspecified injury of head, initial encounter: Secondary | ICD-10-CM | POA: Diagnosis present

## 2022-04-05 DIAGNOSIS — S0003XA Contusion of scalp, initial encounter: Secondary | ICD-10-CM | POA: Insufficient documentation

## 2022-04-05 DIAGNOSIS — Z87891 Personal history of nicotine dependence: Secondary | ICD-10-CM | POA: Diagnosis not present

## 2022-04-05 DIAGNOSIS — Y92009 Unspecified place in unspecified non-institutional (private) residence as the place of occurrence of the external cause: Secondary | ICD-10-CM | POA: Diagnosis not present

## 2022-04-05 DIAGNOSIS — W1830XA Fall on same level, unspecified, initial encounter: Secondary | ICD-10-CM | POA: Diagnosis not present

## 2022-04-05 DIAGNOSIS — I10 Essential (primary) hypertension: Secondary | ICD-10-CM | POA: Diagnosis not present

## 2022-04-05 DIAGNOSIS — G309 Alzheimer's disease, unspecified: Secondary | ICD-10-CM | POA: Insufficient documentation

## 2022-04-05 LAB — URINALYSIS, ROUTINE W REFLEX MICROSCOPIC
Bacteria, UA: NONE SEEN
Bilirubin Urine: NEGATIVE
Glucose, UA: NEGATIVE mg/dL
Ketones, ur: NEGATIVE mg/dL
Leukocytes,Ua: NEGATIVE
Nitrite: NEGATIVE
Protein, ur: NEGATIVE mg/dL
Specific Gravity, Urine: 1.011 (ref 1.005–1.030)
pH: 7 (ref 5.0–8.0)

## 2022-04-05 NOTE — ED Provider Notes (Signed)
[facility] DEPT Provider Note: Georgena Spurling, MD, FACEP  CSN: [id number] MRN: [id number] ARRIVAL: 04/05/22 at [place]: RESB/RESB   CHIEF COMPLAINT  Fall  Level 5 caveat: Dementia HISTORY OF PRESENT ILLNESS  04/05/22 1:38 AM AHYAN KREEGER is a 88 y.o. male with a history of Alzheimer's dementia.  He fell getting out of the shower this morning.  When he fell he hit the back of his head and has a hematoma to his occiput.  He is denying pain.  He did not lose consciousness.  He has not been vomiting.  Per his wife, he is acting at his baseline.  He is able to ambulate without difficulty.   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; chronic left thalamic   Mild neurocognitive disorder due to Alzheimer's disease, possible 09/26/2020   Primary open-angle glaucoma, severe stage 12/29/2006   Renal insufficiency    Weight loss observed 06/19/2008    Past Surgical History:  Procedure Laterality Date   BLADDER SURGERY     CATARACT EXTRACTION     CHOLECYSTECTOMY  03/2010   [place]   COLONOSCOPY     removal of cyst     right arm   SHOULDER SURGERY     cyst removed left shoulder   TONSILECTOMY, ADENOIDECTOMY, BILATERAL MYRINGOTOMY AND TUBES     TRANSURETHRAL RESECTION OF PROSTATE      Family History  Problem Relation Age of Onset   Coronary artery disease Mother    Hypertension Mother    Heart disease Mother    Diabetes Sister    Heart  disease Sister 68       died during cabg   Hypertension Daughter    Hypertension Son    Alzheimer's disease Father     Social History   Tobacco Use   Smoking status: Former    Packs/day: 0.30    Years: 20.00    Total pack years: 6.00    Types: Cigarettes    Quit date: 02/22/1993    Years since quitting: 29.1   Smokeless tobacco: Never  Vaping Use   Vaping Use: Never used  Substance Use Topics   Alcohol use: No   Drug use: Not Currently    Types: Marijuana    Comment: past marijuana use; none currently    Prior to Admission medications   Medication Sig Start Date End Date Taking? Authorizing Provider  acetaminophen (TYLENOL) 325 MG tablet Take 2 tablets (650 mg total) by mouth every 6 (six) hours as needed for moderate pain. 03/20/22   Jaynee Eagles, PA-C  brimonidine (ALPHAGAN) 0.2 % ophthalmic solution Place 1 drop into both eyes in the morning and at bedtime.    [provider]  cloNIDine (CATAPRES) 0.1 MG tablet Take 1 tablet (0.1 mg total) by mouth 2 (two) times daily. 02/23/22   Roma Schanz R, DO  divalproex (DEPAKOTE) 125 MG DR tablet Take 1 tablet (125 mg total) by mouth 2 (  two) times daily. 02/23/22   Roma Schanz R, DO  dorzolamide-timolol (COSOPT) 22.3-6.8 MG/ML ophthalmic solution Place 1 drop into both eyes 2 (two) times daily.    [provider]  metoprolol succinate (TOPROL-XL) 25 MG 24 hr tablet Take 0.5 tablets (12.5 mg total) by mouth daily. 02/23/22   [name], DO  sertraline (ZOLOFT) 25 MG tablet Take 1 tablet (25 mg total) by mouth daily. 01/01/22   Rondel Jumbo, PA-C    Allergies Latanoprost and Netarsudil   REVIEW OF SYSTEMS  Negative except as noted here or in the History of Present Illness.   PHYSICAL EXAMINATION  Initial Vital Signs Blood pressure (!) 233/107, pulse 64, temperature 97.9 F (36.6 C), resp. rate 18, SpO2 100 %.  Examination General: Well-developed, well-nourished male in no acute  distress; appearance consistent with age of record HENT: normocephalic; soft hematoma to occiput Eyes: pupils equal, round and reactive to light; extraocular muscles intact Neck: supple; nontender Heart: regular rate and rhythm Lungs: clear to auscultation bilaterally Abdomen: soft; nondistended; nontender; bowel sounds present Back: Nontender Extremities: No deformity; full range of motion; trace edema of lower legs Neurologic: Awake, alert; motor function intact in all extremities and symmetric; no facial droop Skin: Warm and dry Psychiatric: Normal mood and affect   RESULTS  Summary of this visit's results, reviewed and interpreted by myself:   EKG Interpretation  Date/Time:    Ventricular Rate:    PR Interval:    QRS Duration:   QT Interval:    QTC Calculation:   R Axis:     Text Interpretation:         Laboratory Studies: Results for orders placed or performed during the hospital encounter of 04/05/22 (from the past 24 hour(s))  Urinalysis, Routine w reflex microscopic     Status: Abnormal   Collection Time: 04/05/22  2:34 AM  Result Value Ref Range   Color, Urine STRAW (A) YELLOW   APPearance CLEAR CLEAR   Specific Gravity, Urine 1.011 1.005 - 1.030   pH 7.0 5.0 - 8.0   Glucose, UA NEGATIVE NEGATIVE mg/dL   Hgb urine dipstick MODERATE (A) NEGATIVE   Bilirubin Urine NEGATIVE NEGATIVE   Ketones, ur NEGATIVE NEGATIVE mg/dL   Protein, ur NEGATIVE NEGATIVE mg/dL   Nitrite NEGATIVE NEGATIVE   Leukocytes,Ua NEGATIVE NEGATIVE   WBC, UA 0-5 0 - 5 WBC/hpf   Bacteria, UA NONE SEEN NONE SEEN   Squamous Epithelial / LPF 0-5 0 - 5   Mucus PRESENT    Imaging Studies: CT Head Wo Contrast  Result Date: 04/05/2022 CLINICAL DATA:  Head trauma, intracranial venous injury suspected. Fell and hit back of head. EXAM: CT HEAD WITHOUT CONTRAST TECHNIQUE: Contiguous axial images were obtained from the base of the skull through the vertex without intravenous contrast. RADIATION DOSE  REDUCTION: This exam was performed according to the departmental dose-optimization program which includes automated exposure control, adjustment of the mA and/or kV according to patient size and/or use of iterative reconstruction technique. COMPARISON:  03/20/2022. FINDINGS: Brain: No acute intracranial hemorrhage, midline shift or mass effect. No extra-axial fluid collection. Generalized atrophy is noted. Periventricular white matter hypodensities are present bilaterally. There is no hydrocephalus. There is an old infarct in the medial aspect of the occipital lobe on the right. Vascular: No hyperdense vessel or unexpected calcification. Skull: Normal. Negative for fracture or focal lesion. Sinuses/Orbits: No acute finding. Other: A small scalp hematoma with foci of air is noted over the  parietal bone on the left. IMPRESSION: 1. No acute intracranial hemorrhage. 2. Atrophy with chronic microvascular ischemic changes an old infarct in the occipital lobe on the right. 3. Small scalp hematoma with foci of air over the parietal bone on the left. Electronically Signed   By: Brett Fairy M.D.   On: 04/05/2022 02:58    ED COURSE and MDM  Nursing notes, initial and subsequent vitals signs, including pulse oximetry, reviewed and interpreted by myself.  Vitals:   04/05/22 0134  BP: (!) 233/107  Pulse: 64  Resp: 18  Temp: 97.9 F (36.6 C)  SpO2: 100%   Medications - No data to display  No evidence of significant skull or intracranial injury on CT scan.  Patient is at his baseline per his wife.  He is not on anticoagulation.  PROCEDURES  Procedures   ED DIAGNOSES     ICD-10-CM   1. Fall in home, initial encounter  W19.XXXA    Y92.009     2. Minor head injury, initial encounter  S09.90XA     3. Scalp hematoma, initial encounter  S00.Volanda Napoleon, MD 04/05/22 [phone]

## 2022-04-05 NOTE — ED Triage Notes (Signed)
Pt. BIB [facility] from home for a fall. Pt. Was getting out of the shower when he fell and hit the the back of his head. Denies LOC, neck and back pain, and taking blood thinners. Pt. Has hx of dementia and HTN. Compliant with meds.

## 2022-04-07 ENCOUNTER — Telehealth: Payer: Self-pay | Admitting: Physician Assistant

## 2022-04-07 NOTE — Telephone Encounter (Signed)
The following message was left with [facility] on 04/07/22 at 12:47 PM.   Gwinda Passe from [place] called to get PT verbal orders: 2 x a week for 3 weeks and once a week for 1 week. Also requesting a OT evaluation.

## 2022-04-09 ENCOUNTER — Other Ambulatory Visit: Payer: Self-pay

## 2022-04-09 NOTE — Telephone Encounter (Signed)
Call and left message on [facility] voice mail.

## 2022-04-12 ENCOUNTER — Telehealth: Payer: Self-pay

## 2022-04-12 ENCOUNTER — Other Ambulatory Visit: Payer: Self-pay

## 2022-04-12 NOTE — Telephone Encounter (Signed)
Done

## 2022-04-13 ENCOUNTER — Ambulatory Visit: Payer: Federal, State, Local not specified - PPO | Admitting: Family Medicine

## 2022-04-13 ENCOUNTER — Encounter: Payer: Self-pay | Admitting: Family Medicine

## 2022-04-13 VITALS — BP 142/88 | HR 53 | Temp 98.4°F | Resp 18 | Ht 68.0 in | Wt 134.0 lb

## 2022-04-13 DIAGNOSIS — F02A18 Dementia in other diseases classified elsewhere, mild, with other behavioral disturbance: Secondary | ICD-10-CM

## 2022-04-13 DIAGNOSIS — F4310 Post-traumatic stress disorder, unspecified: Secondary | ICD-10-CM | POA: Diagnosis not present

## 2022-04-13 DIAGNOSIS — N183 Chronic kidney disease, stage 3 unspecified: Secondary | ICD-10-CM

## 2022-04-13 DIAGNOSIS — G309 Alzheimer's disease, unspecified: Secondary | ICD-10-CM

## 2022-04-13 DIAGNOSIS — F5105 Insomnia due to other mental disorder: Secondary | ICD-10-CM | POA: Diagnosis not present

## 2022-04-13 DIAGNOSIS — F99 Mental disorder, not otherwise specified: Secondary | ICD-10-CM | POA: Insufficient documentation

## 2022-04-13 DIAGNOSIS — I10 Essential (primary) hypertension: Secondary | ICD-10-CM

## 2022-04-13 LAB — CBC WITH DIFFERENTIAL/PLATELET
Basophils Absolute: 0 10*3/uL (ref 0.0–0.1)
Basophils Relative: 0.6 % (ref 0.0–3.0)
Eosinophils Absolute: 0.1 10*3/uL (ref 0.0–0.7)
Eosinophils Relative: 2.1 % (ref 0.0–5.0)
HCT: 33.5 % — ABNORMAL LOW (ref 39.0–52.0)
Hemoglobin: 11.2 g/dL — ABNORMAL LOW (ref 13.0–17.0)
Lymphocytes Relative: 26.6 % (ref 12.0–46.0)
Lymphs Abs: 1.4 10*3/uL (ref 0.7–4.0)
MCHC: 33.3 g/dL (ref 30.0–36.0)
MCV: 93 fl (ref 78.0–100.0)
Monocytes Absolute: 0.6 10*3/uL (ref 0.1–1.0)
Monocytes Relative: 10.8 % (ref 3.0–12.0)
Neutro Abs: 3.1 10*3/uL (ref 1.4–7.7)
Neutrophils Relative %: 59.9 % (ref 43.0–77.0)
Platelets: 157 10*3/uL (ref 150.0–400.0)
RBC: 3.6 Mil/uL — ABNORMAL LOW (ref 4.22–5.81)
RDW: 13.6 % (ref 11.5–15.5)
WBC: 5.1 10*3/uL (ref 4.0–10.5)

## 2022-04-13 LAB — COMPREHENSIVE METABOLIC PANEL
ALT: 5 U/L (ref 0–53)
AST: 12 U/L (ref 0–37)
Albumin: 3.5 g/dL (ref 3.5–5.2)
Alkaline Phosphatase: 44 U/L (ref 39–117)
BUN: 19 mg/dL (ref 6–23)
CO2: 26 mEq/L (ref 19–32)
Calcium: 8.6 mg/dL (ref 8.4–10.5)
Chloride: 108 mEq/L (ref 96–112)
Creatinine, Ser: 1.73 mg/dL — ABNORMAL HIGH (ref 0.40–1.50)
GFR: 34.87 mL/min — ABNORMAL LOW (ref >60.00)
Glucose, Bld: 76 mg/dL (ref 70–99)
Potassium: 3.5 mEq/L (ref 3.5–5.1)
Sodium: 141 mEq/L (ref 135–145)
Total Bilirubin: 0.9 mg/dL (ref 0.2–1.2)
Total Protein: 6.7 g/dL (ref 6.0–8.3)

## 2022-04-13 MED ORDER — QUETIAPINE FUMARATE 50 MG PO TABS: 50.0000 mg | ORAL_TABLET | Freq: Every day | ORAL | 2 refills | Status: DC

## 2022-04-13 MED ORDER — SERTRALINE HCL 50 MG PO TABS: 50.0000 mg | ORAL_TABLET | Freq: Every day | ORAL | 3 refills | Status: DC

## 2022-04-13 NOTE — Assessment & Plan Note (Signed)
Inc zoloft to 50 mg daily Refer to psych

## 2022-04-13 NOTE — Assessment & Plan Note (Signed)
seroquel qpm

## 2022-04-13 NOTE — Assessment & Plan Note (Signed)
seroquel po qhs

## 2022-04-13 NOTE — Assessment & Plan Note (Signed)
Well controlled, no changes to meds. Encouraged heart healthy diet such as the DASH diet and exercise as tolerated.  °

## 2022-04-13 NOTE — Progress Notes (Signed)
Subjective:   By signing my name below, I, [name] [name], attest that this documentation has been prepared under the direction and in the presence of [name] Held, DO 04/13/2022    Patient ID: [name] [name], male    DOB: [birth year], 88 y.o.   MRN: [id number]  Chief Complaint  Patient presents with   ED follow up    Fall,     HPI Patient is in today for urgent care follow up.   [name] went to urgent care 04/05/2022. He had fallen while getting out of the shower and hit the [name] of his head, resulting in a hematoma in his occiput. During the encounter he stated that he did not lose consciousness, and was able to ambulate without difficulty.  He is accompanied with his wife who says they plan to get to get a shower chair to avoid having any more falls.   She also reports that he recently "jumped out of a window" due to having a flashback of being [name] in war. She has set alarms and put locks on the windows to prevent him from hurting himself. She adds that he plans to see a neurologist in September.   He is currently going through occupational and physical therapy.   His wife complains that he has been having difficulty falling asleep at night. He normally goes to sleep during the day.   He has not been drinking water, and regularly drinks Pepsi Cola.   Of note, his wife mentions that he has worsening glaucoma which has been interfering with activities such as eating and ambulating. He currently has to feel for his food whenever he has meals, and his soon to have a cane to help him walk.   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; chronic left thalamic   Mild neurocognitive disorder due to Alzheimer's disease, possible 09/26/2020   Primary open-angle glaucoma, severe stage 12/29/2006   Renal insufficiency    Weight loss observed 06/19/2008    Past Surgical History:  Procedure Laterality Date   BLADDER SURGERY     CATARACT EXTRACTION     CHOLECYSTECTOMY  03/2010   [place]   COLONOSCOPY     removal of cyst     right arm   SHOULDER SURGERY     cyst removed left shoulder   TONSILECTOMY, ADENOIDECTOMY, BILATERAL MYRINGOTOMY AND TUBES     TRANSURETHRAL RESECTION OF PROSTATE      Family History  Problem Relation Age of Onset   Coronary artery disease Mother    Hypertension Mother    Heart disease Mother    Diabetes Sister    Heart disease Sister 9       died during cabg   Hypertension Daughter    Hypertension Son    Alzheimer's disease Father     Social History   Socioeconomic History   Marital status: Married    Spouse name: Not on file  Number of children: Not on file   Years of education: 14   Highest education level: Some college, no degree  Occupational History   Occupation: retired Actor  Tobacco Use   Smoking status: Former    Packs/day: 0.30    Years: 20.00    Total pack years: 6.00    Types: Cigarettes    Quit date: 02/22/1993    Years since quitting: 29.1   Smokeless tobacco: Never  Vaping Use   Vaping Use: Never used  Substance and Sexual Activity   Alcohol use: No   Drug use: Not Currently    Types: Marijuana    Comment: past marijuana use; none currently   Sexual activity: Not Currently    Partners: Female  Other Topics Concern   Not on file  Social History Narrative   Exercise--- stair step,  Sit ups, pull ups and bike   Lives with wife   Right handed     Social Determinants of Health   Financial Resource Strain: Low Risk  (05/27/2021)   Overall Financial Resource Strain (CARDIA)    Difficulty of Paying Living Expenses: Not hard at all  Food Insecurity: No Food Insecurity (05/27/2021)   Hunger Vital Sign    Worried About Running Out of Food in the Last Year: Never true    [place] in the Last Year: Never true  Transportation Needs: No Transportation Needs (05/27/2021)   PRAPARE - Hydrologist (Medical): No    Lack of Transportation (Non-Medical): No  Physical Activity: Inactive (05/27/2021)   Exercise Vital Sign    Days of Exercise per Week: 0 days    Minutes of Exercise per Session: 0 min  Stress: No Stress Concern Present (05/27/2021)   [place]    Feeling of Stress : Not at all  Social Connections: Moderately Integrated (05/27/2021)   Social Connection and Isolation Panel [NHANES]    Frequency of Communication with Friends and Family: More than three times a week    Frequency of Social Gatherings with Friends and Family: More than three times a week    Attends Religious Services: 1 to 4 times per year    Active Member of [employer] or Organizations: No    Attends Archivist Meetings: Never    Marital Status: Married  Human resources officer Violence: Not At Risk (05/27/2021)   Humiliation, Afraid, Rape, and Kick questionnaire    Fear of Current or Ex-Partner: No    Emotionally Abused: No    Physically Abused: No    Sexually Abused: No    Outpatient Medications Prior to Visit  Medication Sig Dispense Refill   acetaminophen (TYLENOL) 325 MG tablet Take 2 tablets (650 mg total) by mouth every 6 (six) hours as needed for moderate pain. 30 tablet 0   brimonidine (ALPHAGAN) 0.2 % ophthalmic solution Place 1 drop into both eyes in the morning and at bedtime.     cloNIDine (CATAPRES) 0.1 MG tablet Take 1 tablet (0.1 mg total) by mouth 2  (two) times daily. 180 tablet 1   divalproex (DEPAKOTE) 125 MG DR tablet Take 1 tablet (125 mg total) by mouth 2 (two) times daily. 180 tablet 1   dorzolamide-timolol (COSOPT) 22.3-6.8 MG/ML ophthalmic solution Place 1 drop into both eyes 2 (two) times daily.     metoprolol succinate (TOPROL-XL) 25 MG 24 hr tablet Take 0.5 tablets (12.5 mg total) by mouth daily. 90 tablet  1   sertraline (ZOLOFT) 25 MG tablet Take 1 tablet (25 mg total) by mouth daily. 90 tablet 1   No facility-administered medications prior to visit.    Allergies  Allergen Reactions   Latanoprost Other (See Comments)    Eye irritation   Netarsudil Other (See Comments)    Eye burning and irritation    Review of Systems  Eyes:        (+) Glaucoma    Musculoskeletal:  Positive for falls (in the shower).  Psychiatric/Behavioral:  Positive for hallucinations.        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 He is not ill-appearing.  HENT:     Head: Normocephalic and atraumatic.     Right Ear: External ear normal.     Left Ear: External ear normal.  Eyes:     Extraocular Movements: Extraocular movements intact.     Pupils: Pupils are equal, round, and reactive to light.  Cardiovascular:     Rate and Rhythm: Normal rate and regular rhythm.     Pulses: Normal pulses.     Heart sounds: Normal heart sounds. No murmur heard.    No gallop.  Pulmonary:     Effort: Pulmonary effort is normal. No respiratory distress.     Breath sounds: Normal breath sounds. No wheezing or rales.  Skin:    General: Skin is warm and dry.  Psychiatric:        Judgment: Judgment normal.     BP (!) 142/88 (BP Location: Right Arm, Patient Position: Sitting, Cuff Size: Normal)   Pulse (!) 53   Temp 98.4 F (36.9 C) (Oral)   Resp 18   Ht '5\' 8"'$  (1.727 m)   Wt 134 lb (60.8 kg)   SpO2 99%   BMI 20.37 kg/m  Wt Readings from Last 3  Encounters:  04/13/22 134 lb (60.8 kg)  03/20/22 135 lb (61.2 kg)  02/23/22 133 lb 12.8 oz (60.7 kg)    Diabetic Foot Exam - Simple   No data filed    Lab Results  Component Value Date   WBC 8.7 03/20/2022   HGB 12.1 (L) 03/20/2022   HCT 36.4 (L) 03/20/2022   PLT 177 03/20/2022   GLUCOSE 98 03/20/2022   CHOL 147 02/23/2022   TRIG 123.0 02/23/2022   HDL 45.50 02/23/2022   LDLDIRECT 170.2 10/09/2007   LDLCALC 77 02/23/2022   ALT 6 02/23/2022   AST 14 02/23/2022   NA 140 03/20/2022   K 3.1 (L) 03/20/2022   CL 110 03/20/2022   CREATININE 1.32 (H) 03/20/2022   BUN 17 03/20/2022   CO2 25 03/20/2022   TSH 1.93 02/23/2022   PSA 52.33 (H) 05/11/2021   INR 1.0 11/25/2020   HGBA1C 4.8 11/26/2020   MICROALBUR 9.4 (H) 10/06/2015    Lab Results  Component Value Date   TSH 1.93 02/23/2022   Lab Results  Component Value Date   WBC 8.7 03/20/2022   HGB 12.1 (L) 03/20/2022   HCT 36.4 (L) 03/20/2022   MCV 92.9 03/20/2022   PLT 177 03/20/2022   Lab Results  Component Value Date   NA 140 03/20/2022   K 3.1 (L) 03/20/2022   CO2 25 03/20/2022   GLUCOSE 98 03/20/2022   BUN 17 03/20/2022   CREATININE 1.32 (H) 03/20/2022   BILITOT 1.1 02/23/2022   ALKPHOS 46  02/23/2022   AST 14 02/23/2022   ALT 6 02/23/2022   PROT 6.6 02/23/2022   ALBUMIN 3.5 02/23/2022   CALCIUM 8.6 (L) 03/20/2022   ANIONGAP 5 03/20/2022   GFR 30.42 (L) 02/23/2022   Lab Results  Component Value Date   CHOL 147 02/23/2022   Lab Results  Component Value Date   HDL 45.50 02/23/2022   Lab Results  Component Value Date   LDLCALC 77 02/23/2022   Lab Results  Component Value Date   TRIG 123.0 02/23/2022   Lab Results  Component Value Date   CHOLHDL 3 02/23/2022   Lab Results  Component Value Date   HGBA1C 4.8 11/26/2020       Assessment & Plan:   Problem List Items Addressed This Visit       Unprioritized   PTSD (post-traumatic stress disorder) - Primary    Inc zoloft to 50 mg  daily Refer to psych       Relevant Medications   QUEtiapine (SEROQUEL) 50 MG tablet   sertraline (ZOLOFT) 50 MG tablet   Other Relevant Orders   Ambulatory referral to Psychiatry   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 Encouraged heart healthy diet such as the DASH diet and exercise as tolerated.       CRF (chronic renal failure), stage 3 (moderate) (HCC)   Relevant Orders   CBC with Differential/Platelet   Comprehensive metabolic panel     Meds ordered this encounter  Medications   QUEtiapine (SEROQUEL) 50 MG tablet    Sig: Take 1 tablet (50 mg total) by mouth at bedtime.    Dispense:  30 tablet    Refill:  2   sertraline (ZOLOFT) 50 MG tablet    Sig: Take 1 tablet (50 mg total) by mouth daily.    Dispense:  30 tablet    Refill:  3    I, [name] Held, DO, personally preformed the services described in this documentation.  All medical record entries made by the scribe were at my direction and in my presence.  I have reviewed the chart and discharge instructions (if applicable) and agree that the record reflects my personal performance and is accurate and complete. 04/13/2022   I,Tinashe Williams,acting as a scribe for [name] Held, DO.,have documented all relevant documentation on the behalf of [name] Held, DO,as directed by  [name] Held, DO while in the presence of [name] Held, DO.    [name] Held, DO

## 2022-04-19 ENCOUNTER — Telehealth: Payer: Self-pay | Admitting: Physician Assistant

## 2022-04-19 NOTE — Telephone Encounter (Signed)
I spoke with [facility], they will contact PCP to discuss this on bloodpressure.

## 2022-04-19 NOTE — Telephone Encounter (Signed)
[facility] called and left a message on 04/16/22 at 4:47 pm to get verbal orders for occupational therapy 2 times a week for 2 weeks. PB today was 166/93 triggers a call to provider.

## 2022-04-21 ENCOUNTER — Telehealth: Payer: Self-pay | Admitting: Family Medicine

## 2022-04-21 NOTE — Telephone Encounter (Signed)
Pt just seen on 04/13/2022

## 2022-04-21 NOTE — Telephone Encounter (Signed)
[name] [name] called stating they had a visit from [facility] nurse and the nurse was concerned that pt needed additional treatment to help with his dementia.

## 2022-04-23 ENCOUNTER — Telehealth: Payer: Self-pay | Admitting: Family Medicine

## 2022-04-23 NOTE — Telephone Encounter (Signed)
Erica with [facility] called to report a high blood pressure. She said his blood pressure was 199/120 when she got to patient's residence and she had him drink some water and his wife gave him a second clonidine pill around noon. BP came down to 190/110. She said patient took metoprolol this morning. His other vitals are normal. No chest pain or other pain. She took it while we were on the phone and his bp was at 197/110. Her number if needed is [phone]. Please call patient's wife to advise.

## 2022-04-26 ENCOUNTER — Other Ambulatory Visit: Payer: Self-pay

## 2022-04-26 MED ORDER — METOPROLOL SUCCINATE ER 50 MG PO TB24: 50.0000 mg | ORAL_TABLET | Freq: Every day | ORAL | 2 refills | Status: DC

## 2022-04-26 NOTE — Telephone Encounter (Signed)
Betsy from [facility] calling to report elevated blood pressure. It's 182/92.

## 2022-04-26 NOTE — Telephone Encounter (Signed)
Spoke with patient's wife. Advised of increase in medication. Rx sent to pharmacy and appt made.

## 2022-04-28 ENCOUNTER — Other Ambulatory Visit: Payer: Self-pay

## 2022-04-28 DIAGNOSIS — N289 Disorder of kidney and ureter, unspecified: Secondary | ICD-10-CM

## 2022-04-29 ENCOUNTER — Ambulatory Visit: Payer: Federal, State, Local not specified - PPO | Admitting: Family Medicine

## 2022-04-29 ENCOUNTER — Encounter: Payer: Self-pay | Admitting: Family Medicine

## 2022-04-29 VITALS — BP 138/100 | HR 51 | Temp 97.8°F | Resp 18 | Ht 68.0 in | Wt 134.2 lb

## 2022-04-29 DIAGNOSIS — I10 Essential (primary) hypertension: Secondary | ICD-10-CM

## 2022-04-29 MED ORDER — AMLODIPINE BESYLATE 5 MG PO TABS: 5.0000 mg | ORAL_TABLET | Freq: Every day | ORAL | 1 refills | Status: DC

## 2022-04-29 NOTE — Patient Instructions (Signed)

## 2022-04-29 NOTE — Assessment & Plan Note (Signed)
Poorly controlled will alter medications, encouraged DASH diet, minimize caffeine and obtain adequate sleep. Report concerning symptoms and follow up as directed and as needed Add norvasc 5 mg daily F/u 1 month

## 2022-04-29 NOTE — Progress Notes (Signed)
Subjective:   By signing my name below, I, [name] [name], attest that this documentation has been prepared under the direction and in the presence of [name] Held, DO  04/29/2022    Patient ID: [name] [name], male    DOB: [birth year], 88 y.o.   MRN: [id number]  Chief Complaint  Patient presents with   Hypertension   Follow-up    HPI Patient is in today for a follow up visit. He is present with his wife during this visit.   His blood pressure is elevated during this visit. He continues taking 25 mg metoprolol succinate daily PO, 0.1 mg clonidine 2x daily PO and reports no new issues while taking them.  BP Readings from Last 3 Encounters:  04/29/22 (!) 138/100  04/13/22 (!) 142/88  04/05/22 (!) 218/105   Pulse Readings from Last 3 Encounters:  04/29/22 (!) 51  04/13/22 (!) 53  04/05/22 (!) 58   He continues drinking ensure regularly.    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; chronic left thalamic   Mild neurocognitive disorder due to Alzheimer's disease, possible 09/26/2020   Primary open-angle glaucoma, severe stage 12/29/2006   Renal insufficiency    Weight loss observed 06/19/2008    Past Surgical History:  Procedure Laterality Date   BLADDER SURGERY     CATARACT EXTRACTION     CHOLECYSTECTOMY  03/2010   [place] [facility]   COLONOSCOPY     removal of cyst     right arm   SHOULDER SURGERY     cyst removed left shoulder    TONSILECTOMY, ADENOIDECTOMY, BILATERAL MYRINGOTOMY AND TUBES     TRANSURETHRAL RESECTION OF PROSTATE      Family History  Problem Relation Age of Onset   Coronary artery disease Mother    Hypertension Mother    Heart disease Mother    Diabetes Sister    Heart disease Sister 94       died during cabg   Hypertension Daughter    Hypertension Son    Alzheimer's disease Father     Social History   Socioeconomic History   Marital status: Married    Spouse name: Not on file   Number of children: Not on file   Years of education: 14   Highest education level: Some college, no degree  Occupational History   Occupation: retired Actor  Tobacco Use   Smoking status: Former    Packs/day: 0.30    Years: 20.00    Total pack years: 6.00    Types: Cigarettes    Quit date: 02/22/1993    Years since quitting: 29.2   Smokeless tobacco: Never  Vaping Use   Vaping Use: Never used  Substance and Sexual Activity   Alcohol use: No   Drug use: Not Currently    Types: Marijuana    Comment: past marijuana use; none currently   Sexual activity: Not Currently    Partners: Female  Other  Topics Concern   Not on file  Social History Narrative   Exercise--- stair step,  Sit ups, pull ups and bike   Lives with wife   Right handed    Social Determinants of Health   Financial Resource Strain: Low Risk  (05/27/2021)   Overall Financial Resource Strain (CARDIA)    Difficulty of Paying Living Expenses: Not hard at all  Food Insecurity: No Food Insecurity (05/27/2021)   Hunger Vital Sign    Worried About Running Out of Food in the Last Year: Never true    Ran Out of Food in the Last Year: Never true  Transportation Needs: No Transportation Needs (05/27/2021)   PRAPARE - Hydrologist (Medical): No    Lack of Transportation (Non-Medical): No  Physical Activity: Inactive (05/27/2021)   Exercise Vital Sign    Days of Exercise per Week: 0 days    Minutes of  Exercise per Session: 0 min  Stress: No Stress Concern Present (05/27/2021)   [place]    Feeling of Stress : Not at all  Social Connections: Moderately Integrated (05/27/2021)   Social Connection and Isolation Panel [NHANES]    Frequency of Communication with Friends and Family: More than three times a week    Frequency of Social Gatherings with Friends and Family: More than three times a week    Attends Religious Services: 1 to 4 times per year    Active Member of [employer] or Organizations: No    Attends Archivist Meetings: Never    Marital Status: Married  Human resources officer Violence: Not At Risk (05/27/2021)   Humiliation, Afraid, Rape, and Kick questionnaire    Fear of Current or Ex-Partner: No    Emotionally Abused: No    Physically Abused: No    Sexually Abused: No    Outpatient Medications Prior to Visit  Medication Sig Dispense Refill   acetaminophen (TYLENOL) 325 MG tablet Take 2 tablets (650 mg total) by mouth every 6 (six) hours as needed for moderate pain. 30 tablet 0   brimonidine (ALPHAGAN) 0.2 % ophthalmic solution Place 1 drop into both eyes in the morning and at bedtime.     cloNIDine (CATAPRES) 0.1 MG tablet Take 1 tablet (0.1 mg total) by mouth 2 (two) times daily. 180 tablet 1   divalproex (DEPAKOTE) 125 MG DR tablet Take 1 tablet (125 mg total) by mouth 2 (two) times daily. 180 tablet 1   dorzolamide-timolol (COSOPT) 22.3-6.8 MG/ML ophthalmic solution Place 1 drop into both eyes 2 (two) times daily.     metoprolol succinate (TOPROL XL) 50 MG 24 hr tablet Take 1 tablet (50 mg total) by mouth daily. Take with or immediately following a meal. 30 tablet 2   QUEtiapine (SEROQUEL) 50 MG tablet Take 1 tablet (50 mg total) by mouth at bedtime. 30 tablet 2   sertraline (ZOLOFT) 50 MG tablet Take 1 tablet (50 mg total) by mouth daily. 30 tablet 3   No facility-administered medications prior to visit.     Allergies  Allergen Reactions   Latanoprost Other (See Comments)    Eye irritation   Netarsudil Other (See Comments)    Eye burning and irritation    Review of Systems  Constitutional:  Negative for fever and malaise/fatigue.  HENT:  Negative for congestion.   Eyes:  Negative for blurred vision.  Respiratory:  Negative for shortness of breath.   Cardiovascular:  Negative for chest  pain, palpitations and leg swelling.  Gastrointestinal:  Negative for abdominal pain, blood in stool and nausea.  Genitourinary:  Negative for dysuria and frequency.  Musculoskeletal:  Negative for falls.  Skin:  Negative for rash.  Neurological:  Negative for dizziness, loss of consciousness and headaches.  Endo/Heme/Allergies:  Negative for environmental allergies.  Psychiatric/Behavioral:  Negative for depression. The patient is not nervous/anxious.        Objective:    Physical Exam Vitals and nursing note reviewed.  Constitutional:      General: He is not in acute distress.    Appearance: Normal appearance. He is not ill-appearing.  HENT:     Head: Normocephalic and atraumatic.     Right Ear: External ear normal.     Left Ear: External ear normal.  Eyes:     Extraocular Movements: Extraocular movements intact.     Pupils: Pupils are equal, round, and reactive to light.  Cardiovascular:     Rate and Rhythm: Normal rate and regular rhythm.     Heart sounds: Normal heart sounds. No murmur heard.    No gallop.  Pulmonary:     Effort: Pulmonary effort is normal. No respiratory distress.     Breath sounds: Normal breath sounds. No wheezing or rales.  Skin:    General: Skin is warm and dry.  Neurological:     Mental Status: He is alert and oriented to person, place, and time.  Psychiatric:        Judgment: Judgment normal.     BP (!) 138/100 (BP Location: Left Arm, Patient Position: Sitting, Cuff Size: Normal)   Pulse (!) 51   Temp 97.8 F (36.6 C) (Oral)   Resp 18   Ht '5\' 8"'$   (1.727 m)   Wt 134 lb 3.2 oz (60.9 kg)   SpO2 98%   BMI 20.41 kg/m  Wt Readings from Last 3 Encounters:  04/29/22 134 lb 3.2 oz (60.9 kg)  04/13/22 134 lb (60.8 kg)  03/20/22 135 lb (61.2 kg)    Diabetic Foot Exam - Simple   No data filed    Lab Results  Component Value Date   WBC 5.1 04/13/2022   HGB 11.2 (L) 04/13/2022   HCT 33.5 (L) 04/13/2022   PLT 157.0 04/13/2022   GLUCOSE 76 04/13/2022   CHOL 147 02/23/2022   TRIG 123.0 02/23/2022   HDL 45.50 02/23/2022   LDLDIRECT 170.2 10/09/2007   LDLCALC 77 02/23/2022   ALT 5 04/13/2022   AST 12 04/13/2022   NA 141 04/13/2022   K 3.5 04/13/2022   CL 108 04/13/2022   CREATININE 1.73 (H) 04/13/2022   BUN 19 04/13/2022   CO2 26 04/13/2022   TSH 1.93 02/23/2022   PSA 52.33 (H) 05/11/2021   INR 1.0 11/25/2020   HGBA1C 4.8 11/26/2020   MICROALBUR 9.4 (H) 10/06/2015    Lab Results  Component Value Date   TSH 1.93 02/23/2022   Lab Results  Component Value Date   WBC 5.1 04/13/2022   HGB 11.2 (L) 04/13/2022   HCT 33.5 (L) 04/13/2022   MCV 93.0 04/13/2022   PLT 157.0 04/13/2022   Lab Results  Component Value Date   NA 141 04/13/2022   K 3.5 04/13/2022   CO2 26 04/13/2022   GLUCOSE 76 04/13/2022   BUN 19 04/13/2022   CREATININE 1.73 (H) 04/13/2022   BILITOT 0.9 04/13/2022   ALKPHOS 44 04/13/2022   AST 12 04/13/2022   ALT 5 04/13/2022   PROT 6.7  04/13/2022   ALBUMIN 3.5 04/13/2022   CALCIUM 8.6 04/13/2022   ANIONGAP 5 03/20/2022   GFR 34.87 (L) 04/13/2022   Lab Results  Component Value Date   CHOL 147 02/23/2022   Lab Results  Component Value Date   HDL 45.50 02/23/2022   Lab Results  Component Value Date   LDLCALC 77 02/23/2022   Lab Results  Component Value Date   TRIG 123.0 02/23/2022   Lab Results  Component Value Date   CHOLHDL 3 02/23/2022   Lab Results  Component Value Date   HGBA1C 4.8 11/26/2020       Assessment & Plan:   Problem List Items Addressed This Visit        Unprioritized   Essential hypertension    Poorly controlled will alter medications, encouraged DASH diet, minimize caffeine and obtain adequate sleep. Report concerning symptoms and follow up as directed and as needed Add norvasc 5 mg daily F/u 1 month       Relevant Medications   amLODipine (NORVASC) 5 MG tablet   Other Visit Diagnoses     Primary hypertension    -  Primary   Relevant Medications   amLODipine (NORVASC) 5 MG tablet        Meds ordered this encounter  Medications   amLODipine (NORVASC) 5 MG tablet    Sig: Take 1 tablet (5 mg total) by mouth daily.    Dispense:  90 tablet    Refill:  1    I, [name] Held, DO, personally preformed the services described in this documentation.  All medical record entries made by the scribe were at my direction and in my presence.  I have reviewed the chart and discharge instructions (if applicable) and agree that the record reflects my personal performance and is accurate and complete. 04/29/2022   I,[name] [name],acting as a scribe for [name] Held, DO.,have documented all relevant documentation on the behalf of [name] Held, DO,as directed by  [name] Held, DO while in the presence of [name] Held, DO.   [name] Held, DO

## 2022-05-23 ENCOUNTER — Encounter (HOSPITAL_COMMUNITY): Payer: Self-pay | Admitting: Emergency Medicine

## 2022-05-23 ENCOUNTER — Inpatient Hospital Stay (HOSPITAL_COMMUNITY)
Admission: EM | Admit: 2022-05-23 | Discharge: 2022-05-28 | DRG: 304 | Disposition: A | Discharge status: Hospice | Payer: Medicare Other | Attending: Internal Medicine | Admitting: Internal Medicine

## 2022-05-23 ENCOUNTER — Emergency Department (HOSPITAL_COMMUNITY): Payer: Medicare Other

## 2022-05-23 ENCOUNTER — Other Ambulatory Visit: Payer: Self-pay

## 2022-05-23 DIAGNOSIS — Z79899 Other long term (current) drug therapy: Secondary | ICD-10-CM

## 2022-05-23 DIAGNOSIS — Z515 Encounter for palliative care: Secondary | ICD-10-CM | POA: Diagnosis not present

## 2022-05-23 DIAGNOSIS — Z888 Allergy status to other drugs, medicaments and biological substances status: Secondary | ICD-10-CM

## 2022-05-23 DIAGNOSIS — R29810 Facial weakness: Secondary | ICD-10-CM | POA: Diagnosis not present

## 2022-05-23 DIAGNOSIS — I6522 Occlusion and stenosis of left carotid artery: Secondary | ICD-10-CM | POA: Diagnosis present

## 2022-05-23 DIAGNOSIS — Z66 Do not resuscitate: Secondary | ICD-10-CM | POA: Diagnosis present

## 2022-05-23 DIAGNOSIS — I674 Hypertensive encephalopathy: Secondary | ICD-10-CM | POA: Diagnosis present

## 2022-05-23 DIAGNOSIS — I129 Hypertensive chronic kidney disease with stage 1 through stage 4 chronic kidney disease, or unspecified chronic kidney disease: Secondary | ICD-10-CM | POA: Diagnosis present

## 2022-05-23 DIAGNOSIS — Z681 Body mass index (BMI) 19 or less, adult: Secondary | ICD-10-CM | POA: Diagnosis not present

## 2022-05-23 DIAGNOSIS — G8191 Hemiplegia, unspecified affecting right dominant side: Secondary | ICD-10-CM | POA: Diagnosis not present

## 2022-05-23 DIAGNOSIS — I639 Cerebral infarction, unspecified: Secondary | ICD-10-CM

## 2022-05-23 DIAGNOSIS — E785 Hyperlipidemia, unspecified: Secondary | ICD-10-CM | POA: Diagnosis present

## 2022-05-23 DIAGNOSIS — R001 Bradycardia, unspecified: Secondary | ICD-10-CM | POA: Diagnosis present

## 2022-05-23 DIAGNOSIS — N1832 Chronic kidney disease, stage 3b: Secondary | ICD-10-CM | POA: Diagnosis present

## 2022-05-23 DIAGNOSIS — R29709 NIHSS score 9: Secondary | ICD-10-CM | POA: Diagnosis not present

## 2022-05-23 DIAGNOSIS — R4182 Altered mental status, unspecified: Secondary | ICD-10-CM | POA: Diagnosis not present

## 2022-05-23 DIAGNOSIS — E876 Hypokalemia: Secondary | ICD-10-CM | POA: Diagnosis present

## 2022-05-23 DIAGNOSIS — R778 Other specified abnormalities of plasma proteins: Secondary | ICD-10-CM

## 2022-05-23 DIAGNOSIS — R059 Cough, unspecified: Secondary | ICD-10-CM | POA: Diagnosis not present

## 2022-05-23 DIAGNOSIS — Z820 Family history of epilepsy and other diseases of the nervous system: Secondary | ICD-10-CM

## 2022-05-23 DIAGNOSIS — R4781 Slurred speech: Secondary | ICD-10-CM | POA: Diagnosis not present

## 2022-05-23 DIAGNOSIS — R4702 Dysphasia: Secondary | ICD-10-CM | POA: Diagnosis not present

## 2022-05-23 DIAGNOSIS — Z20822 Contact with and (suspected) exposure to covid-19: Secondary | ICD-10-CM | POA: Diagnosis present

## 2022-05-23 DIAGNOSIS — W19XXXA Unspecified fall, initial encounter: Secondary | ICD-10-CM

## 2022-05-23 DIAGNOSIS — F32A Depression, unspecified: Secondary | ICD-10-CM | POA: Diagnosis present

## 2022-05-23 DIAGNOSIS — I7389 Other specified peripheral vascular diseases: Secondary | ICD-10-CM | POA: Diagnosis present

## 2022-05-23 DIAGNOSIS — I2510 Atherosclerotic heart disease of native coronary artery without angina pectoris: Secondary | ICD-10-CM | POA: Diagnosis present

## 2022-05-23 DIAGNOSIS — N289 Disorder of kidney and ureter, unspecified: Secondary | ICD-10-CM

## 2022-05-23 DIAGNOSIS — I422 Other hypertrophic cardiomyopathy: Secondary | ICD-10-CM | POA: Diagnosis present

## 2022-05-23 DIAGNOSIS — F0390 Unspecified dementia without behavioral disturbance: Secondary | ICD-10-CM

## 2022-05-23 DIAGNOSIS — I161 Hypertensive emergency: Secondary | ICD-10-CM | POA: Diagnosis not present

## 2022-05-23 DIAGNOSIS — F0393 Unspecified dementia, unspecified severity, with mood disturbance: Secondary | ICD-10-CM | POA: Diagnosis present

## 2022-05-23 DIAGNOSIS — Z9049 Acquired absence of other specified parts of digestive tract: Secondary | ICD-10-CM

## 2022-05-23 DIAGNOSIS — I6381 Other cerebral infarction due to occlusion or stenosis of small artery: Secondary | ICD-10-CM | POA: Diagnosis not present

## 2022-05-23 DIAGNOSIS — N400 Enlarged prostate without lower urinary tract symptoms: Secondary | ICD-10-CM | POA: Diagnosis present

## 2022-05-23 DIAGNOSIS — G929 Unspecified toxic encephalopathy: Secondary | ICD-10-CM

## 2022-05-23 DIAGNOSIS — R1310 Dysphagia, unspecified: Secondary | ICD-10-CM | POA: Diagnosis not present

## 2022-05-23 DIAGNOSIS — I169 Hypertensive crisis, unspecified: Principal | ICD-10-CM

## 2022-05-23 DIAGNOSIS — I6521 Occlusion and stenosis of right carotid artery: Secondary | ICD-10-CM | POA: Diagnosis present

## 2022-05-23 DIAGNOSIS — R638 Other symptoms and signs concerning food and fluid intake: Secondary | ICD-10-CM | POA: Diagnosis not present

## 2022-05-23 DIAGNOSIS — Z8249 Family history of ischemic heart disease and other diseases of the circulatory system: Secondary | ICD-10-CM

## 2022-05-23 DIAGNOSIS — H9190 Unspecified hearing loss, unspecified ear: Secondary | ICD-10-CM | POA: Diagnosis present

## 2022-05-23 DIAGNOSIS — R627 Adult failure to thrive: Secondary | ICD-10-CM | POA: Diagnosis present

## 2022-05-23 DIAGNOSIS — I10 Essential (primary) hypertension: Secondary | ICD-10-CM | POA: Diagnosis not present

## 2022-05-23 DIAGNOSIS — H401193 Primary open-angle glaucoma, unspecified eye, severe stage: Secondary | ICD-10-CM | POA: Diagnosis present

## 2022-05-23 DIAGNOSIS — H409 Unspecified glaucoma: Secondary | ICD-10-CM | POA: Diagnosis present

## 2022-05-23 DIAGNOSIS — R296 Repeated falls: Secondary | ICD-10-CM | POA: Diagnosis present

## 2022-05-23 DIAGNOSIS — Z8673 Personal history of transient ischemic attack (TIA), and cerebral infarction without residual deficits: Secondary | ICD-10-CM

## 2022-05-23 DIAGNOSIS — Z7189 Other specified counseling: Secondary | ICD-10-CM | POA: Diagnosis not present

## 2022-05-23 DIAGNOSIS — M5030 Other cervical disc degeneration, unspecified cervical region: Secondary | ICD-10-CM

## 2022-05-23 DIAGNOSIS — Z87891 Personal history of nicotine dependence: Secondary | ICD-10-CM

## 2022-05-23 DIAGNOSIS — R9431 Abnormal electrocardiogram [ECG] [EKG]: Secondary | ICD-10-CM | POA: Diagnosis not present

## 2022-05-23 LAB — CBC WITH DIFFERENTIAL/PLATELET
Abs Immature Granulocytes: 0.01 10*3/uL (ref 0.00–0.07)
Basophils Absolute: 0 10*3/uL (ref 0.0–0.1)
Basophils Relative: 1 %
Eosinophils Absolute: 0.1 10*3/uL (ref 0.0–0.5)
Eosinophils Relative: 1 %
HCT: 36.6 % — ABNORMAL LOW (ref 39.0–52.0)
Hemoglobin: 12.2 g/dL — ABNORMAL LOW (ref 13.0–17.0)
Immature Granulocytes: 0 %
Lymphocytes Relative: 24 %
Lymphs Abs: 1.5 10*3/uL (ref 0.7–4.0)
MCH: 30.9 pg (ref 26.0–34.0)
MCHC: 33.3 g/dL (ref 30.0–36.0)
MCV: 92.7 fL (ref 80.0–100.0)
Monocytes Absolute: 0.5 10*3/uL (ref 0.1–1.0)
Monocytes Relative: 8 %
Neutro Abs: 4.2 10*3/uL (ref 1.7–7.7)
Neutrophils Relative %: 66 %
Platelets: 211 10*3/uL (ref 150–400)
RBC: 3.95 MIL/uL — ABNORMAL LOW (ref 4.22–5.81)
RDW: 12.4 % (ref 11.5–15.5)
WBC: 6.3 10*3/uL (ref 4.0–10.5)
nRBC: 0 % (ref 0.0–0.2)

## 2022-05-23 LAB — COMPREHENSIVE METABOLIC PANEL
ALT: 11 U/L (ref 0–44)
AST: 18 U/L (ref 15–41)
Albumin: 3.4 g/dL — ABNORMAL LOW (ref 3.5–5.0)
Alkaline Phosphatase: 46 U/L (ref 38–126)
Anion gap: 13 (ref 5–15)
BUN: 16 mg/dL (ref 8–23)
CO2: 23 mmol/L (ref 22–32)
Calcium: 8.8 mg/dL — ABNORMAL LOW (ref 8.9–10.3)
Chloride: 104 mmol/L (ref 98–111)
Creatinine, Ser: 1.44 mg/dL — ABNORMAL HIGH (ref 0.61–1.24)
GFR, Estimated: 47 mL/min — ABNORMAL LOW (ref >60)
Glucose, Bld: 96 mg/dL (ref 70–99)
Potassium: 3.6 mmol/L (ref 3.5–5.1)
Sodium: 140 mmol/L (ref 135–145)
Total Bilirubin: 1.2 mg/dL (ref 0.3–1.2)
Total Protein: 7.1 g/dL (ref 6.5–8.1)

## 2022-05-23 LAB — URINALYSIS, ROUTINE W REFLEX MICROSCOPIC
Bilirubin Urine: NEGATIVE
Glucose, UA: NEGATIVE mg/dL
Hgb urine dipstick: NEGATIVE
Ketones, ur: NEGATIVE mg/dL
Leukocytes,Ua: NEGATIVE
Nitrite: NEGATIVE
Protein, ur: NEGATIVE mg/dL
Specific Gravity, Urine: 1.015 (ref 1.005–1.030)
pH: 6 (ref 5.0–8.0)

## 2022-05-23 LAB — GLUCOSE, CAPILLARY
Glucose-Capillary: 120 mg/dL — ABNORMAL HIGH (ref 70–99)
Glucose-Capillary: 135 mg/dL — ABNORMAL HIGH (ref 70–99)

## 2022-05-23 LAB — CBC
HCT: 38.8 % — ABNORMAL LOW (ref 39.0–52.0)
Hemoglobin: 13.4 g/dL (ref 13.0–17.0)
MCH: 31.5 pg (ref 26.0–34.0)
MCHC: 34.5 g/dL (ref 30.0–36.0)
MCV: 91.3 fL (ref 80.0–100.0)
Platelets: 195 10*3/uL (ref 150–400)
RBC: 4.25 MIL/uL (ref 4.22–5.81)
RDW: 12.5 % (ref 11.5–15.5)
WBC: 14.8 10*3/uL — ABNORMAL HIGH (ref 4.0–10.5)
nRBC: 0 % (ref 0.0–0.2)

## 2022-05-23 LAB — BASIC METABOLIC PANEL
Anion gap: 10 (ref 5–15)
BUN: 14 mg/dL (ref 8–23)
CO2: 22 mmol/L (ref 22–32)
Calcium: 8.6 mg/dL — ABNORMAL LOW (ref 8.9–10.3)
Chloride: 107 mmol/L (ref 98–111)
Creatinine, Ser: 1.2 mg/dL (ref 0.61–1.24)
GFR, Estimated: 58 mL/min — ABNORMAL LOW (ref >60)
Glucose, Bld: 134 mg/dL — ABNORMAL HIGH (ref 70–99)
Potassium: 3.3 mmol/L — ABNORMAL LOW (ref 3.5–5.1)
Sodium: 139 mmol/L (ref 135–145)

## 2022-05-23 LAB — TROPONIN I (HIGH SENSITIVITY)
Troponin I (High Sensitivity): 34 ng/L — ABNORMAL HIGH (ref <18)
Troponin I (High Sensitivity): 36 ng/L — ABNORMAL HIGH (ref <18)

## 2022-05-23 LAB — MAGNESIUM: Magnesium: 1.9 mg/dL (ref 1.7–2.4)

## 2022-05-23 LAB — SARS CORONAVIRUS 2 BY RT PCR: SARS Coronavirus 2 by RT PCR: NEGATIVE

## 2022-05-23 LAB — MRSA NEXT GEN BY PCR, NASAL: MRSA by PCR Next Gen: NOT DETECTED

## 2022-05-23 LAB — VALPROIC ACID LEVEL: Valproic Acid Lvl: 13 ug/mL — ABNORMAL LOW (ref 50.0–100.0)

## 2022-05-23 MED ORDER — MIDAZOLAM HCL 2 MG/2ML IJ SOLN
2.0000 mg | Freq: Once | INTRAMUSCULAR | Status: AC
Start: 2022-05-23 — End: 2022-05-23
  Administered 2022-05-23: 2 mg via INTRAVENOUS
  Filled 2022-05-23: qty 2

## 2022-05-23 MED ORDER — HEPARIN SODIUM (PORCINE) 5000 UNIT/ML IJ SOLN
5000.0000 [IU] | Freq: Three times a day (TID) | INTRAMUSCULAR | Status: DC
  Administered 2022-05-23 – 2022-05-27 (×14): 5000 [IU] via SUBCUTANEOUS
  Filled 2022-05-23 (×15): qty 1

## 2022-05-23 MED ORDER — METOPROLOL SUCCINATE ER 50 MG PO TB24
50.0000 mg | ORAL_TABLET | Freq: Every day | ORAL | Status: DC
  Administered 2022-05-23: 50 mg via ORAL
  Filled 2022-05-23: qty 1

## 2022-05-23 MED ORDER — HYDRALAZINE HCL 20 MG/ML IJ SOLN
10.0000 mg | Freq: Four times a day (QID) | INTRAMUSCULAR | Status: DC | PRN
  Administered 2022-05-25: 10 mg via INTRAVENOUS
  Filled 2022-05-23 (×2): qty 1

## 2022-05-23 MED ORDER — CLONIDINE HCL 0.1 MG PO TABS
0.1000 mg | ORAL_TABLET | Freq: Two times a day (BID) | ORAL | Status: DC
  Administered 2022-05-23 – 2022-05-24 (×3): 0.1 mg via ORAL
  Filled 2022-05-23 (×3): qty 1

## 2022-05-23 MED ORDER — POLYETHYLENE GLYCOL 3350 17 G PO PACK: 17.0000 g | PACK | Freq: Every day | ORAL | Status: DC | PRN

## 2022-05-23 MED ORDER — ORAL CARE MOUTH RINSE: 15.0000 mL | OROMUCOSAL | Status: DC | PRN

## 2022-05-23 MED ORDER — DIVALPROEX SODIUM 125 MG PO DR TAB
125.0000 mg | DELAYED_RELEASE_TABLET | Freq: Two times a day (BID) | ORAL | Status: DC
  Administered 2022-05-23 – 2022-05-28 (×11): 125 mg via ORAL
  Filled 2022-05-23 (×13): qty 1

## 2022-05-23 MED ORDER — CHLORHEXIDINE GLUCONATE CLOTH 2 % EX PADS
6.0000 | MEDICATED_PAD | Freq: Every day | CUTANEOUS | Status: DC
  Administered 2022-05-23 – 2022-05-27 (×6): 6 via TOPICAL

## 2022-05-23 MED ORDER — DOCUSATE SODIUM 100 MG PO CAPS
100.0000 mg | ORAL_CAPSULE | Freq: Two times a day (BID) | ORAL | Status: DC | PRN
  Administered 2022-05-23 – 2022-05-27 (×3): 100 mg via ORAL
  Filled 2022-05-23 (×3): qty 1

## 2022-05-23 MED ORDER — DORZOLAMIDE HCL-TIMOLOL MAL 2-0.5 % OP SOLN
1.0000 [drp] | Freq: Two times a day (BID) | OPHTHALMIC | Status: DC
  Administered 2022-05-23 – 2022-05-28 (×11): 1 [drp] via OPHTHALMIC
  Filled 2022-05-23: qty 10

## 2022-05-23 MED ORDER — SERTRALINE HCL 50 MG PO TABS
50.0000 mg | ORAL_TABLET | Freq: Every day | ORAL | Status: DC
Start: 2022-05-23 — End: 2022-05-27
  Administered 2022-05-23 – 2022-05-27 (×5): 50 mg via ORAL
  Filled 2022-05-23 (×5): qty 1

## 2022-05-23 MED ORDER — NICARDIPINE HCL IN NACL 20-0.86 MG/200ML-% IV SOLN
3.0000 mg/h | INTRAVENOUS | Status: DC
  Administered 2022-05-23 (×2): 15 mg/h via INTRAVENOUS
  Administered 2022-05-23: 5 mg/h via INTRAVENOUS
  Administered 2022-05-23: 10 mg/h via INTRAVENOUS
  Filled 2022-05-23 (×8): qty 200

## 2022-05-23 MED ORDER — AMLODIPINE BESYLATE 10 MG PO TABS
10.0000 mg | ORAL_TABLET | Freq: Every day | ORAL | Status: DC
  Administered 2022-05-23 – 2022-05-27 (×5): 10 mg via ORAL
  Filled 2022-05-23 (×5): qty 1

## 2022-05-23 MED ORDER — QUETIAPINE FUMARATE 50 MG PO TABS
50.0000 mg | ORAL_TABLET | Freq: Every day | ORAL | Status: DC
  Administered 2022-05-23 – 2022-05-24 (×2): 50 mg via ORAL
  Filled 2022-05-23 (×2): qty 1

## 2022-05-23 MED ORDER — PANTOPRAZOLE SODIUM 40 MG PO TBEC
40.0000 mg | DELAYED_RELEASE_TABLET | Freq: Every day | ORAL | Status: DC
  Administered 2022-05-23: 40 mg via ORAL
  Filled 2022-05-23: qty 1

## 2022-05-23 MED ORDER — BRIMONIDINE TARTRATE 0.2 % OP SOLN
1.0000 [drp] | Freq: Two times a day (BID) | OPHTHALMIC | Status: DC
Start: 2022-05-23 — End: 2022-05-29
  Administered 2022-05-23 – 2022-05-28 (×11): 1 [drp] via OPHTHALMIC
  Filled 2022-05-23: qty 5

## 2022-05-23 MED ORDER — POTASSIUM CHLORIDE 10 MEQ/100ML IV SOLN
10.0000 meq | INTRAVENOUS | Status: AC
  Administered 2022-05-23 (×6): 10 meq via INTRAVENOUS
  Filled 2022-05-23 (×6): qty 100

## 2022-05-23 NOTE — Progress Notes (Signed)
Patient admitted to 3M09. Wife at bedside, all patient belongings taken home by her. Pt pleasantly confused.

## 2022-05-23 NOTE — ED Triage Notes (Signed)
Patient arrived with EMS from home family reported multiple falls this evening , denies injury , persistent fatigue and generalized weakness. Hypertensive at triage . Denies pain/respirations unlabored.

## 2022-05-23 NOTE — ED Provider Notes (Signed)
[facility] EMERGENCY DEPARTMENT Provider Note   CSN: [id number] Arrival date & time: 05/23/22  0157     History  Chief Complaint  Patient presents with   Fall / Hypertension     BP=232/111    [name] [name] is a 88 y.o. male.  The history is provided by the EMS personnel. The history is limited by the condition of the patient (level 5 caveat dementia).  Fall This is a new problem. The current episode started 12 to 24 hours ago. The problem occurs constantly. The problem has been resolved. Pertinent negatives include no chest pain. Nothing aggravates the symptoms. Nothing relieves the symptoms. He has tried nothing for the symptoms. The treatment provided no relief.  Patient with HTN and dementia brought in by EMS  for multiple falls and global weakness at home.      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 Authorizing Provider  acetaminophen (TYLENOL) 325 MG tablet Take 2 tablets (650 mg total) by mouth every 6 (six) hours as needed for moderate pain. 03/20/22   Jaynee Eagles, PA-C  amLODipine (NORVASC) 5 MG tablet Take 1 tablet (5 mg  total) by mouth daily. 04/29/22   Roma Schanz R, DO  brimonidine (ALPHAGAN) 0.2 % ophthalmic solution Place 1 drop into both eyes in the morning and at bedtime.    [provider]  cloNIDine (CATAPRES) 0.1 MG tablet Take 1 tablet (0.1 mg total) by mouth 2 (two) times daily. 02/23/22   Roma Schanz R, DO  divalproex (DEPAKOTE) 125 MG DR tablet Take 1 tablet (125 mg total) by mouth 2 (two) times daily. 02/23/22   Roma Schanz R, DO  dorzolamide-timolol (COSOPT) 22.3-6.8 MG/ML ophthalmic solution Place 1 drop into both eyes 2 (two) times daily.    [provider]  metoprolol succinate (TOPROL XL) 50 MG 24 hr tablet Take 1 tablet (50 mg total) by mouth daily. Take with or immediately following a meal. 04/26/22   Carollee Herter, Alferd Apa, DO  QUEtiapine (SEROQUEL) 50 MG tablet Take 1 tablet (50 mg total) by mouth at bedtime. 04/13/22   [name], DO  sertraline (ZOLOFT) 50 MG tablet Take 1 tablet (50 mg total) by mouth daily. 04/13/22   [name], DO      Allergies    Latanoprost and Netarsudil    Review of Systems   Review of Systems  Unable to perform ROS: Dementia  Constitutional:  Negative for fever.  HENT:  Negative for facial swelling.   Cardiovascular:  Negative for chest pain.  Gastrointestinal:  Negative for vomiting.  Musculoskeletal:  Negative for back pain and  neck pain.    Physical Exam Updated Vital Signs BP (!) 232/111 (BP Location: Right Arm)   Pulse (!) 56   Temp 98.7 F (37.1 C) (Oral)   Resp (!) 24   SpO2 100%  Physical Exam Vitals and nursing note reviewed. Exam conducted with a chaperone present.  Constitutional:      General: He is not in acute distress.    Appearance: Normal appearance. He is well-developed. He is not diaphoretic.  HENT:     Head: Normocephalic and atraumatic.     Nose: Nose normal.  Eyes:     Extraocular Movements: Extraocular movements intact.     Conjunctiva/sclera: Conjunctivae normal.      Pupils: Pupils are equal, round, and reactive to light.  Cardiovascular:     Rate and Rhythm: Normal rate and regular rhythm.     Pulses: Normal pulses.     Heart sounds: Normal heart sounds.  Pulmonary:     Effort: Pulmonary effort is normal.     Breath sounds: Normal breath sounds. No wheezing or rales.  Abdominal:     General: Bowel sounds are normal.     Palpations: Abdomen is soft.     Tenderness: There is no abdominal tenderness. There is no guarding or rebound.  Musculoskeletal:        General: Normal range of motion.     Cervical back: Normal range of motion and neck supple.  Skin:    General: Skin is warm and dry.  Neurological:     General: No focal deficit present.     Mental Status: He is alert.     Deep Tendon Reflexes: Reflexes normal.  Psychiatric:        Mood and Affect: Mood normal.     ED Results / Procedures / Treatments   Labs (all labs ordered are listed, but only abnormal results are displayed) Results for orders placed or performed during the hospital encounter of 05/23/22  CBC with Differential  Result Value Ref Range   WBC 6.3 4.0 - 10.5 K/uL   RBC 3.95 (L) 4.22 - 5.81 MIL/uL   Hemoglobin 12.2 (L) 13.0 - 17.0 g/dL   HCT 36.6 (L) 39.0 - 52.0 %   MCV 92.7 80.0 - 100.0 fL   MCH 30.9 26.0 - 34.0 pg   MCHC 33.3 30.0 - 36.0 g/dL   RDW 12.4 11.5 - 15.5 %   Platelets 211 150 - 400 K/uL   nRBC 0.0 0.0 - 0.2 %   Neutrophils Relative % 66 %   Neutro Abs 4.2 1.7 - 7.7 K/uL   Lymphocytes Relative 24 %   Lymphs Abs 1.5 0.7 - 4.0 K/uL   Monocytes Relative 8 %   Monocytes Absolute 0.5 0.1 - 1.0 K/uL   Eosinophils Relative 1 %   Eosinophils Absolute 0.1 0.0 - 0.5 K/uL   Basophils Relative 1 %   Basophils Absolute 0.0 0.0 - 0.1 K/uL   Immature Granulocytes 0 %   Abs Immature Granulocytes 0.01 0.00 - 0.07 K/uL  Comprehensive metabolic panel  Result Value Ref Range   Sodium 140 135 - 145 mmol/L   Potassium 3.6 3.5 - 5.1 mmol/L   Chloride 104 98 -  111 mmol/L   CO2 23 22 - 32 mmol/L   Glucose, Bld 96 70 - 99 mg/dL   BUN 16 8 - 23 mg/dL   Creatinine, Ser 1.44 (H) 0.61 - 1.24 mg/dL   Calcium 8.8 (L) 8.9 - 10.3 mg/dL   Total  Protein 7.1 6.5 - 8.1 g/dL   Albumin 3.4 (L) 3.5 - 5.0 g/dL   AST 18 15 - 41 U/L   ALT 11 0 - 44 U/L   Alkaline Phosphatase 46 38 - 126 U/L   Total Bilirubin 1.2 0.3 - 1.2 mg/dL   GFR, Estimated 47 (L) >60 mL/min   Anion gap 13 5 - 15  Urinalysis, Routine w reflex microscopic Urine, Clean Catch  Result Value Ref Range   Color, Urine YELLOW YELLOW   APPearance CLEAR CLEAR   Specific Gravity, Urine 1.015 1.005 - 1.030   pH 6.0 5.0 - 8.0   Glucose, UA NEGATIVE NEGATIVE mg/dL   Hgb urine dipstick NEGATIVE NEGATIVE   Bilirubin Urine NEGATIVE NEGATIVE   Ketones, ur NEGATIVE NEGATIVE mg/dL   Protein, ur NEGATIVE NEGATIVE mg/dL   Nitrite NEGATIVE NEGATIVE   Leukocytes,Ua NEGATIVE NEGATIVE  Valproic acid level  Result Value Ref Range   Valproic Acid Lvl 13 (L) 50.0 - 100.0 ug/mL  Troponin I (High Sensitivity)  Result Value Ref Range   Troponin I (High Sensitivity) 34 (H) <18 ng/L   DG Chest Portable 1 View  Result Date: 05/23/2022 CLINICAL DATA:  Hypertension, weakness EXAM: PORTABLE CHEST 1 VIEW COMPARISON:  06/26/2010 FINDINGS: Left upper lobe scarring. Right lung is clear. No pleural effusion or pneumothorax. The heart is normal in size.  Thoracic aortic atherosclerosis. IMPRESSION: No evidence of acute cardiopulmonary disease. Left upper lobe scarring. Electronically Signed   By: Julian Hy M.D.   On: 05/23/2022 03:39     EKG EKG Interpretation  Date/Time:  Sunday May 23 2022 02:07:40 EDT Ventricular Rate:  54 PR Interval:  164 QRS Duration: 86 QT Interval:  526 QTC Calculation: 498 R Axis:   35 Text Interpretation: Sinus bradycardia with Premature supraventricular complexes Left ventricular hypertrophy with repolarization abnormality ( R in aVL , Sokolow-Lyon , Cornell product ,  Romhilt-Estes ) Prolonged QT Confirmed by Randal Buba, Anamaria Dusenbury ([id number]) on 05/23/2022 2:38:00 AM  Radiology No results found.  Procedures Procedures    Medications Ordered in ED Medications  nicardipine (CARDENE) '20mg'$  in 0.86% saline 224m IV infusion (0.1 mg/ml) (5 mg/hr Intravenous New Bag/Given 05/23/22 0324)     ED Course/ Medical Decision Making/ A&P                           Medical Decision Making Multiple falls at home tonight secondary to global weakness.  BP markedly elevated.    Amount and/or Complexity of Data Reviewed Independent Historian: EMS    Details: See above  External Data Reviewed: notes.    Details: Previous outpatient notes reviewed  Labs: ordered.    Details: All labs reviewed:  Normal white count 6.3, low hemoglobin 12.2, normal platelet count 211k.  Urine is without UTI.  Troponin elevated at 34.  Normal sodium 140 and potassium 3.6.  elevated creatinine 1.44.  Normal LFT Radiology: ordered and independent interpretation performed.    Details: No pneumonia or CHF by me on CXR.  Negative CT head by me  ECG/medicine tests: ordered and independent interpretation performed. Decision-making details documented in ED Course. Discussion of management or test interpretation with external provider(s): Case d/w Dr. BLamonte Sakaiof PCCM who will see the patient   Risk Prescription drug management. Decision regarding hospitalization.  Critical Care Total time providing critical care: 60 minutes (Complexity of diagnostic work up.  Assessments.  Carden drip for BP management  )    Final  Clinical Impression(s) / ED Diagnoses Final diagnoses:  None   The patient appears reasonably stabilized for admission considering the current resources, flow, and capabilities available in the ED at this time, and I doubt any other [facility] requiring further screening and/or treatment in the ED prior to admission.  Rx / DC Orders ED Discharge Orders     None         Prabhjot Piscitello,  MD 05/23/22 0430

## 2022-05-23 NOTE — Progress Notes (Signed)
eLink Physician-Brief Progress Note Patient Name: [name] [name] DOB: 1932/07/03 MRN: [id number]   Date of Service  05/23/2022  HPI/Events of Note  89/M admitted for hypertensive emergency, requiring nicardipine infusion.  eICU Interventions  Titrate nicardipine as per protocol. Would aim to reduce BP by 25-30% acutely.  Plan to start home antihypoertensive medications, and wean off nicardipine as warranted.  Head CT negative for bleed/infarct.         Shan Padgett M DELA CRUZ 05/23/2022, 6:26 AM

## 2022-05-23 NOTE — Progress Notes (Signed)
Pt noted to be bradycardic, Dr. Loanne Drilling paged. Heart rate noted to be as low as 48, blood pressure remains stable.

## 2022-05-23 NOTE — Progress Notes (Signed)
eLink Physician-Brief Progress Note Patient Name: [name] [name] DOB: 1931-12-13 MRN: [id number]   Date of Service  05/23/2022  HPI/Events of Note  Notified of bradycardia mostly in the high 40s when the patient sleeps.   On camera assessment, BP 177/91, HR 59, RR 18, O2 sats 100%.   eICU Interventions  Would tolerate HR in the 40s as long as BP is within normal limits.      Intervention Category Intermediate Interventions: Other:  [name] 05/23/2022, 8:23 PM

## 2022-05-23 NOTE — H&P (Signed)
NAME:  ANTONI [name], MRN:  [id number], DOB:  1932/09/04, LOS: 0 ADMISSION DATE:  05/23/2022, CONSULTATION DATE:  05/23/22 REFERRING MD:  Dr Randal Buba, CHIEF COMPLAINT:  Hypertension, encephalopathy   History of Present Illness:  88 year old man with history of dementia, hypertension, chronic renal insufficiency, CAD, BPH. He has apparently been experiencing persistent fatigue generalized weakness.  Per family experienced multiple falls 9/9, possibly w R sided weakness, prompting EMS.  On arrival he is profoundly hypertensive 232/111, confused.  He does not remember falling.  Denies any injury, discomfort.  Started on nicardipine infusion.   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; chronic left thalamic   Mild neurocognitive disorder due to Alzheimer's disease, possible 09/26/2020   Primary open-angle glaucoma, severe stage 12/29/2006   Renal insufficiency    Weight loss observed 06/19/2008     Significant Hospital Events: Including procedures, antibiotic start and stop dates in addition to other pertinent events   Admitted with hypertensive emergency 9/10 Head CT 05/23/2022 >  CT cervical spine 05/23/2022 >  Chest x-ray 9/10 clear lungs, normal heart size, some chronic left upper lobe scarring    Interim History / Subjective:  Currently nicardipine 5 Denies any discomfort  Objective   Blood pressure (!) 180/98, pulse 73,  temperature 98.7 F (37.1 C), temperature source Oral, resp. rate 20, SpO2 100 %.       No intake or output data in the 24 hours ending 05/23/22 0411 There were no vitals filed for this visit.  Examination: General: Thin elderly man in no distress HENT: Oropharynx clear, pupils equal Lungs: Clear bilaterally Cardiovascular: Regular, no murmur Abdomen: Nondistended, positive bowel sounds Extremities: No edema Neuro: Awake, interacting and answering questions but clearly with some confusion, occasionally garbled speech.  Moves all extremities symmetrically with good strength GU: Foley catheter  Resolved Hospital Problem list     Assessment & Plan:   Hypertensive emergency.  On amlodipine 5, clonidine 0.1, metoprolol per home meds.  Question whether has been compliant with home medications -notes from PCP indicate that his BP has been adequately controlled when he is reliably on meds.  Amlodipine added 8/17.  We will try to confirm with family -Continue nicardipine, initial goal SBP 155 for 12 to 24 hours, then aim for normotension -Continue amlodipine, increase to 10 mg -Continue metoprolol -Continue clonidine 0.1 twice daily -Consider repeat echocardiogram  Encephalopathy, presumed due to hypertension superimposed on known Alzheimer's dementia with mood disturbance -Correct blood pressure as outlined above -Depakote 125 mg twice daily -Seroquel 50 mg nightly  Open-angle glaucoma -Cosopt 1 drop bilaterally twice daily -Alphagan 1 drop twice daily  Depression -Continue home sertraline   Best Practice (right click and "Reselect all SmartList Selections" daily)   Diet/type: Regular consistency (see orders) DVT prophylaxis: prophylactic heparin  GI prophylaxis: PPI Lines: N/A Foley:  Yes, and it is still needed Code Status:  full code Last date of multidisciplinary goals of  care discussion [pending]  Labs   CBC: Recent Labs  Lab 05/23/22 0230  WBC 6.3  NEUTROABS  4.2  HGB 12.2*  HCT 36.6*  MCV 92.7  PLT 858    Basic Metabolic Panel: Recent Labs  Lab 05/23/22 0230  NA 140  K 3.6  CL 104  CO2 23  GLUCOSE 96  BUN 16  CREATININE 1.44*  CALCIUM 8.8*   GFR: CrCl cannot be calculated (Unknown ideal weight.). Recent Labs  Lab 05/23/22 0230  WBC 6.3    Liver Function Tests: Recent Labs  Lab 05/23/22 0230  AST 18  ALT 11  ALKPHOS 46  BILITOT 1.2  PROT 7.1  ALBUMIN 3.4*   No results for input(s): "LIPASE", "AMYLASE" in the last 168 hours. No results for input(s): "AMMONIA" in the last 168 hours.  ABG No results found for: "PHART", "PCO2ART", "PO2ART", "HCO3", "TCO2", "ACIDBASEDEF", "O2SAT"   Coagulation Profile: No results for input(s): "INR", "PROTIME" in the last 168 hours.  Cardiac Enzymes: No results for input(s): "CKTOTAL", "CKMB", "CKMBINDEX", "TROPONINI" in the last 168 hours.  HbA1C: Hgb A1c MFr Bld  Date/Time Value Ref Range Status  11/26/2020 04:09 AM 4.8 4.8 - 5.6 % Final    Comment:    (NOTE) Pre diabetes:          5.7%-6.4%  Diabetes:              >6.4%  Glycemic control for   <7.0% adults with diabetes   04/02/2013 12:08 PM 5.1 4.6 - 6.5 % Final    Comment:    Glycemic Control Guidelines for People with Diabetes:Non Diabetic:  <6%Goal of Therapy: <7%Additional Action Suggested:  >8%     CBG: No results for input(s): "GLUCAP" in the last 168 hours.  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   Surgical History:   Past Surgical History:  Procedure Laterality Date   BLADDER SURGERY     CATARACT EXTRACTION     CHOLECYSTECTOMY  03/2010   [place] surgery   COLONOSCOPY     removal of cyst     right arm   SHOULDER SURGERY     cyst removed left shoulder   TONSILECTOMY, ADENOIDECTOMY, BILATERAL MYRINGOTOMY AND TUBES     TRANSURETHRAL RESECTION OF PROSTATE       Social History:   reports that he quit smoking about 29 years ago. His smoking use included cigarettes. He has a 6.00 pack-year smoking history. He has never used smokeless tobacco. He reports that he does not currently use drugs after having used the following drugs: Marijuana. He reports that he does not drink alcohol.   Family History:  His family history includes Alzheimer's disease in his father; Coronary artery disease in his mother; Diabetes in his sister; Heart disease in his mother; Heart disease (age of onset: 32) in his sister; Hypertension in his daughter, mother, and son.   Allergies Allergies  Allergen Reactions   Latanoprost Other (See Comments)    Eye irritation  Netarsudil Other (See Comments)    Eye burning and irritation     Home Medications  Prior to Admission medications   Medication Sig Start Date End Date Taking? Authorizing Provider  acetaminophen (TYLENOL) 325 MG tablet Take 2 tablets (650 mg total) by mouth every 6 (six) hours as needed for moderate pain. 03/20/22   Jaynee Eagles, PA-C  amLODipine (NORVASC) 5 MG tablet Take 1 tablet (5 mg total) by mouth daily. 04/29/22   Roma Schanz R, DO  brimonidine (ALPHAGAN) 0.2 % ophthalmic solution Place 1 drop into both eyes in the morning and at bedtime.    [provider]  cloNIDine (CATAPRES) 0.1 MG tablet Take 1 tablet  (0.1 mg total) by mouth 2 (two) times daily. 02/23/22   Roma Schanz R, DO  divalproex (DEPAKOTE) 125 MG DR tablet Take 1 tablet (125 mg total) by mouth 2 (two) times daily. 02/23/22   Roma Schanz R, DO  dorzolamide-timolol (COSOPT) 22.3-6.8 MG/ML ophthalmic solution Place 1 drop into both eyes 2 (two) times daily.    [provider]  metoprolol succinate (TOPROL XL) 50 MG 24 hr tablet Take 1 tablet (50 mg total) by mouth daily. Take with or immediately following a meal. 04/26/22   Carollee Herter, Alferd Apa, DO  QUEtiapine (SEROQUEL) 50 MG tablet Take 1 tablet (50 mg total) by mouth at bedtime. 04/13/22   [name], DO  sertraline (ZOLOFT) 50 MG tablet Take 1 tablet (50 mg total) by mouth daily. 04/13/22   [name], DO     Critical care time: 40 minutes     Baltazar Apo, MD, PhD 05/23/2022, 4:38 AM [facility] [phone] or if no answer before 7:00PM call [phone] For any issues after 7:00PM please call eLink [phone]

## 2022-05-23 NOTE — Progress Notes (Signed)
During assessment patient noted with right sided weakness in his hands and weakness with right leg. Wife stated he is right handed and noted he had been dropping his spoon lately while attempting to feed himself. Patient also have HR in 40's while sleeping. When awake he is in th lower 60's. [place] notified.

## 2022-05-23 NOTE — Progress Notes (Signed)
Pt attempting to exit bed despite several attempts to redirect from staff. Pt became agitated, combative, wrist restraints and mitts applied.

## 2022-05-23 NOTE — ED Notes (Signed)
This RN returned to room with patient from CT.

## 2022-05-24 ENCOUNTER — Inpatient Hospital Stay (HOSPITAL_COMMUNITY): Payer: Medicare Other

## 2022-05-24 DIAGNOSIS — I161 Hypertensive emergency: Secondary | ICD-10-CM | POA: Diagnosis not present

## 2022-05-24 DIAGNOSIS — R9431 Abnormal electrocardiogram [ECG] [EKG]: Secondary | ICD-10-CM

## 2022-05-24 DIAGNOSIS — R4182 Altered mental status, unspecified: Secondary | ICD-10-CM | POA: Diagnosis not present

## 2022-05-24 LAB — BLOOD GAS, VENOUS
Acid-Base Excess: 0.8 mmol/L (ref 0.0–2.0)
Bicarbonate: 26.6 mmol/L (ref 20.0–28.0)
Drawn by: 164
O2 Saturation: 53.3 %
Patient temperature: 36.7
pCO2, Ven: 45 mmHg (ref 44–60)
pH, Ven: 7.37 (ref 7.25–7.43)
pO2, Ven: <31 mmHg — CL (ref 32–45)

## 2022-05-24 LAB — ECHOCARDIOGRAM COMPLETE
AR max vel: 3.36 cm2
AV Area VTI: 3.84 cm2
AV Area mean vel: 3.5 cm2
AV Mean grad: 2 mmHg
AV Peak grad: 4.3 mmHg
Ao pk vel: 1.03 m/s
Area-P 1/2: 1.83 cm2
Calc EF: 58.6 %
MV M vel: 3.56 m/s
MV Peak grad: 50.6 mmHg
P 1/2 time: 923 msec
S' Lateral: 3.5 cm
Single Plane A2C EF: 56.2 %
Single Plane A4C EF: 58.9 %
Weight: 2038.81 oz

## 2022-05-24 LAB — BASIC METABOLIC PANEL
Anion gap: 7 (ref 5–15)
BUN: 13 mg/dL (ref 8–23)
CO2: 22 mmol/L (ref 22–32)
Calcium: 8.5 mg/dL — ABNORMAL LOW (ref 8.9–10.3)
Chloride: 108 mmol/L (ref 98–111)
Creatinine, Ser: 1.22 mg/dL (ref 0.61–1.24)
GFR, Estimated: 57 mL/min — ABNORMAL LOW (ref >60)
Glucose, Bld: 82 mg/dL (ref 70–99)
Potassium: 3.4 mmol/L — ABNORMAL LOW (ref 3.5–5.1)
Sodium: 137 mmol/L (ref 135–145)

## 2022-05-24 LAB — CBC
HCT: 34 % — ABNORMAL LOW (ref 39.0–52.0)
Hemoglobin: 11.3 g/dL — ABNORMAL LOW (ref 13.0–17.0)
MCH: 31 pg (ref 26.0–34.0)
MCHC: 33.2 g/dL (ref 30.0–36.0)
MCV: 93.2 fL (ref 80.0–100.0)
Platelets: 163 10*3/uL (ref 150–400)
RBC: 3.65 MIL/uL — ABNORMAL LOW (ref 4.22–5.81)
RDW: 12.7 % (ref 11.5–15.5)
WBC: 7.6 10*3/uL (ref 4.0–10.5)
nRBC: 0 % (ref 0.0–0.2)

## 2022-05-24 LAB — HEMOGLOBIN A1C
Hgb A1c MFr Bld: 4.6 % — ABNORMAL LOW (ref 4.8–5.6)
Mean Plasma Glucose: 85.32 mg/dL

## 2022-05-24 LAB — MAGNESIUM: Magnesium: 2 mg/dL (ref 1.7–2.4)

## 2022-05-24 MED ORDER — LATANOPROST 0.005 % OP SOLN
1.0000 [drp] | Freq: Every day | OPHTHALMIC | Status: DC
Start: 2022-05-24 — End: 2022-05-29
  Administered 2022-05-24 – 2022-05-27 (×4): 1 [drp] via OPHTHALMIC
  Filled 2022-05-24: qty 2.5

## 2022-05-24 MED ORDER — POTASSIUM CHLORIDE 20 MEQ PO PACK
40.0000 meq | PACK | Freq: Once | ORAL | Status: AC
  Administered 2022-05-24: 40 meq via ORAL
  Filled 2022-05-24: qty 2

## 2022-05-24 MED ORDER — LORAZEPAM 2 MG/ML IJ SOLN
0.5000 mg | Freq: Once | INTRAMUSCULAR | Status: AC
Start: 2022-05-24 — End: 2022-05-24
  Administered 2022-05-24: 0.5 mg via INTRAVENOUS
  Filled 2022-05-24: qty 1

## 2022-05-24 MED ORDER — ASPIRIN 81 MG PO TBEC
81.0000 mg | DELAYED_RELEASE_TABLET | Freq: Every day | ORAL | Status: DC
  Administered 2022-05-24 – 2022-05-27 (×4): 81 mg via ORAL
  Filled 2022-05-24 (×5): qty 1

## 2022-05-24 MED ORDER — CLONIDINE HCL 0.2 MG PO TABS
0.2000 mg | ORAL_TABLET | Freq: Two times a day (BID) | ORAL | Status: DC
  Administered 2022-05-24 – 2022-05-27 (×6): 0.2 mg via ORAL
  Filled 2022-05-24 (×6): qty 1

## 2022-05-24 MED ORDER — PERFLUTREN LIPID MICROSPHERE
1.0000 mL | INTRAVENOUS | Status: AC | PRN
  Administered 2022-05-24: 2 mL via INTRAVENOUS

## 2022-05-24 MED ORDER — POTASSIUM CHLORIDE CRYS ER 20 MEQ PO TBCR: 40.0000 meq | EXTENDED_RELEASE_TABLET | Freq: Once | ORAL | Status: DC

## 2022-05-24 NOTE — Evaluation (Signed)
Clinical/Bedside Swallow Evaluation Patient Details  Name: [name] [name] MRN: [id number] Date of Birth: 08-Jul-1932  Today's Date: 05/24/2022 Time: SLP Start Time (ACUTE ONLY): 16 SLP Stop Time (ACUTE ONLY): 2440 SLP Time Calculation (min) (ACUTE ONLY): 16 min  Past Medical History:  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; chronic left thalamic   Mild neurocognitive disorder due to Alzheimer's disease, possible 09/26/2020   Primary open-angle glaucoma, severe stage 12/29/2006   Renal insufficiency    Weight loss observed 06/19/2008   Past Surgical History:  Past Surgical History:  Procedure Laterality Date   BLADDER SURGERY     CATARACT EXTRACTION     CHOLECYSTECTOMY  03/2010   [place] [facility]   COLONOSCOPY     removal of cyst     right arm   SHOULDER SURGERY     cyst removed left shoulder   TONSILECTOMY, ADENOIDECTOMY, BILATERAL MYRINGOTOMY AND TUBES     TRANSURETHRAL RESECTION OF PROSTATE     HPI:  88 year old male lives at home with his wife admitted with hypertension, generalized weakness but concern for predominant right-sided weakness. MRI pending.  Pmhx dementia, CKD stage IIIb, CAD, BPH admitted with generalized weakness.  Family reported multiple falls prior to admission.    Assessment / Plan / Recommendation  Clinical Impression  Mr. Ressler presents with functional swallowing. He demonstrates mild difficulty shifting motor pattern  between sucking through a straw and removing pudding from a spoon, for example, but he adapts quickly. He demonstrated adequate attention to POs, thorough mastication of solids, and no s/s of aspiration of thin liquids. Recommend continuing a regular diet to allow more food options; thin liquids; meds in water. He will need assistance with tray set-up and feeding while in this new environment. No further f/u needed for swallowing - our service will sign off. SLP Visit Diagnosis: Dysphagia, unspecified (R13.10)    Aspiration Risk  No limitations    Diet Recommendation   Regular solids, thin liquids  Medication Administration: Whole meds with liquid    Other  Recommendations Oral Care Recommendations: Oral care BID    Recommendations for follow up therapy are one component of a multi-disciplinary discharge planning process, led by the attending physician.  Recommendations may be updated based on patient status, additional functional criteria and insurance authorization.  Follow up Recommendations No SLP follow up      Assistance Recommended at Discharge Frequent or constant Supervision/Assistance    Swallow Study   General Date of Onset: 05/23/22 HPI: 88 year old male lives at home with his wife admitted with hypertension, generalized weakness but concern for predominant right-sided weakness. MRI pending.  Pmhx dementia, CKD stage IIIb, CAD, BPH admitted with generalized weakness.  Family reported multiple falls prior to admission. Type of Study: Bedside Swallow Evaluation Previous Swallow Assessment: no Diet Prior to this Study: Regular;Thin liquids Temperature Spikes Noted: No Respiratory Status: Room air History of Recent Intubation: No Behavior/Cognition: Alert;Pleasant mood;Confused Oral Cavity Assessment: Within Functional Limits  Oral Care Completed by SLP: Recent completion by staff Oral Cavity - Dentition: Adequate natural dentition Self-Feeding Abilities: Needs  assist Patient Positioning: Upright in bed Baseline Vocal Quality: Normal Volitional Cough: Strong Volitional Swallow: Able to elicit    Oral/Motor/Sensory Function Overall Oral Motor/Sensory Function: Mild impairment Facial Symmetry: Abnormal symmetry right;Suspected CN VII (facial) dysfunction Lingual Symmetry: Within Functional Limits   Ice Chips Ice chips: Within functional limits   Thin Liquid Thin Liquid: Within functional limits    Nectar Thick Nectar Thick Liquid: Not tested   Honey Thick Honey Thick Liquid: Not tested   Puree Puree: Within functional limits   Solid     Solid: Within functional limits      [name] [name] 05/24/2022,11:26 AM  [name] Bamberg L. Tivis Ringer, MA CCC/SLP Clinical Specialist - [place] Office number [phone]

## 2022-05-24 NOTE — Progress Notes (Signed)
  Echocardiogram 2D Echocardiogram has been performed.  [name] [name] 05/24/2022, 11:11 AM

## 2022-05-24 NOTE — Consult Note (Signed)
Neurology Consultation  Reason for Consult: right sided weakness, stroke seen on MRI Referring Physician: Dr. Rodena Piety  CC: right sided weakness  History is obtained from:spouse and chart  HPI: [name] [name] is a 88 y.o. male with history of dementia, glaucoma, HTN, renal insufficiency, CAD and BPH who presented after multiple falls at home, right sided weakness and acute hypertension.  His wife states that he has a history of an irregular heartbeat, but only a history of sinus bradycardia could be found in the chart.  According to patient's wife, on Saturday 9/9, he began falling, was holding his right arm at an odd angle and had difficulty putting weight on his right leg.  He fell multiple times at home, and the first few times, his wife assisted him.  EMS was called, and patient was brought to the hospital and found to be quite hypertensive.  He was treated with a nicardipine gtt.  MRI shows acute infarct in left external capsule.   LKW: 9/9 TNK given?: no, outside of window IR Thrombectomy? No, outside of window Modified Rankin Scale: 3-Moderate disability-requires help but walks WITHOUT assistance  ROS: Unable to obtain due to altered mental status.   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; chronic left thalamic   Mild neurocognitive disorder due to Alzheimer's disease, possible 09/26/2020   Primary open-angle glaucoma, severe stage 12/29/2006   Renal  insufficiency    Weight loss observed 06/19/2008     Family History  Problem Relation Age of Onset   Coronary artery disease Mother    Hypertension Mother    Heart disease Mother    Diabetes Sister    Heart disease Sister 70       died during cabg   Hypertension Daughter    Hypertension Son    Alzheimer's disease Father      Social History:   reports that he quit smoking about 29 years ago. His smoking use included cigarettes. He has a 6.00 pack-year smoking history. He has never used smokeless tobacco. He reports that he does not currently use drugs after having used the following drugs: Marijuana. He reports that he does not drink alcohol.  Medications  Current Facility-Administered Medications:    amLODipine (NORVASC) tablet 10 mg, 10 mg, Oral, Daily, Byrum, Rose Fillers, MD, 10 mg at 05/24/22 2694   aspirin EC tablet 81 mg, 81 mg, Oral, Daily, Georgette Shell, MD   brimonidine (ALPHAGAN) 0.2 % ophthalmic solution 1 drop, 1 drop, Both Eyes, BID, Byrum, Rose Fillers, MD, 1 drop at 05/24/22 8546   Chlorhexidine Gluconate Cloth 2 % PADS 6 each, 6 each, Topical, Daily, Collene Gobble, MD, 6 each at 05/23/22 0649   cloNIDine (CATAPRES) tablet 0.2 mg, 0.2 mg, Oral, BID, Georgette Shell, MD   divalproex (DEPAKOTE) DR tablet 125 mg, 125 mg, Oral, BID, Collene Gobble, MD, 125 mg at 05/24/22 2703   docusate sodium (COLACE) capsule 100 mg, 100 mg, Oral, BID  PRN, Collene Gobble, MD, 100 mg at 05/23/22 2049   dorzolamide-timolol (COSOPT) 22.3-6.8 MG/ML ophthalmic solution 1 drop, 1 drop, Both Eyes, BID, Byrum, Rose Fillers, MD, 1 drop at 05/24/22 0933   heparin injection 5,000 Units, 5,000 Units, Subcutaneous, Q8H, Byrum, Rose Fillers, MD, 5,000 Units at 05/24/22 1345   hydrALAZINE (APRESOLINE) injection 10 mg, 10 mg, Intravenous, Q6H PRN, Margaretha Seeds, MD   latanoprost (XALATAN) 0.005 % ophthalmic solution 1 drop, 1 drop, Both Eyes, QHS, Georgette Shell, MD   nicardipine (CARDENE)  70m in 0.86% saline 2045mIV infusion (0.1 mg/ml), 3-15 mg/hr, Intravenous, Continuous, ByCollene GobbleMD, Stopped at 05/23/22 1223   Oral care mouth rinse, 15 mL, Mouth Rinse, PRN, ElMargaretha SeedsMD   polyethylene glycol (MIRALAX / GLYCOLAX) packet 17 g, 17 g, Oral, Daily PRN, ByCollene GobbleMD   QUEtiapine (SEROQUEL) tablet 50 mg, 50 mg, Oral, QHS, ByCollene GobbleMD, 50 mg at 05/23/22 2048   sertraline (ZOLOFT) tablet 50 mg, 50 mg, Oral, Daily, ByCollene GobbleMD, 50 mg at 05/24/22 [id number] Exam: Current vital signs: BP 121/69 (BP Location: Right Arm)   Pulse (!) 43   Temp 97.6 F (36.4 C) (Oral)   Resp 12   Wt 57.8 kg   SpO2 97%   BMI 19.37 kg/m  Vital signs in last 24 hours: Temp:  [97.6 F (36.4 C)-98.1 F (36.7 C)] 97.6 F (36.4 C) (09/11 1523) Pulse Rate:  [43-63] 43 (09/11 1500) Resp:  [0-25] 12 (09/11 1500) BP: (111-198)/(61-118) 121/69 (09/11 1500) SpO2:  [97 %-100 %] 97 % (09/11 1500) Weight:  [57.8 kg] 57.8 kg (09/11 0500)  GENERAL: Drowsy, in no acute distress Psych: Affect appropriate for situation, patient is calm and cooperative with examination Head: Normocephalic and atraumatic, without obvious abnormality EENT: Normal conjunctivae, moist mucous membranes, no OP obstruction LUNGS: Normal respiratory effort. Non-labored breathing on room air CV: Regular rate and rhythm on telemetry Extremities: warm, well perfused, without obvious deformity  NEURO (recently received lorazepam for MRI):  Mental Status: Drowsy, oriented x1 only He is not able to provide a clear and coherent history of present illness. Speech/Language: speech is slow but clear.   Fluency, and comprehension intact without aphasia  No neglect is noted Cranial Nerves:  II: Right pupil larger than left and off center (75m66might 2mm30mft), this is his baseline per wife, unable to test visual fields due to drowsiness and AMS III, IV, VI: EOMI. Lid elevation symmetric and full.  V:  Sensation is intact to light touch and symmetrical to face.  VII: Right sided facial droop VIII: Hearing intact to voice IX, X:Phonation normal.  XII: Tongue protrudes midline without fasciculations.   Motor: 4+/5 strength to LUE and LLE, 4-/5 strength to RUE and RLE  Tone is normal. Bulk is normal.  Sensation: Intact to light touch bilaterally in all four extremities. No extinction to DSS present.  Coordination: Drift in right arm and leg. Unable to perform FTN due to drowsiness Gait: Deferred  NIHSS: 1a Level of Conscious.: 1 1b LOC Questions: 2 1c LOC Commands: 1 2 Best Gaze: 0 3 Visual: 0 4 Facial Palsy: 1 5a Motor Arm - left: 0 5b Motor Arm - Right: 1 6a Motor Leg - Left: 0 6b Motor Leg - Right: 1 7 Limb Ataxia: 0 8 Sensory: 0 9 Best Language: 0 10 Dysarthria: 0 11 Extinct. and Inatten.: 0 TOTAL: 7   Labs I have reviewed  labs in epic and the results pertinent to this consultation are:   CBC    Component Value Date/Time   WBC 7.6 05/24/2022 0517   RBC 3.65 (L) 05/24/2022 0517   HGB 11.3 (L) 05/24/2022 0517   HCT 34.0 (L) 05/24/2022 0517   PLT 163 05/24/2022 0517   MCV 93.2 05/24/2022 0517   MCH 31.0 05/24/2022 0517   MCHC 33.2 05/24/2022 0517   RDW 12.7 05/24/2022 0517   LYMPHSABS 1.5 05/23/2022 0230   MONOABS 0.5 05/23/2022 0230   EOSABS 0.1 05/23/2022 0230   BASOSABS 0.0 05/23/2022 0230    CMP     Component Value Date/Time   NA 137 05/24/2022 0517   K 3.4 (L) 05/24/2022 0517   CL 108 05/24/2022 0517   CO2 22 05/24/2022 0517   GLUCOSE 82 05/24/2022 0517   BUN 13 05/24/2022 0517   CREATININE 1.22 05/24/2022 0517   CREATININE 1.71 (H) 05/05/2020 1110   CALCIUM 8.5 (L) 05/24/2022 0517   PROT 7.1 05/23/2022 0230   ALBUMIN 3.4 (L) 05/23/2022 0230   AST 18 05/23/2022 0230   ALT 11 05/23/2022 0230   ALKPHOS 46 05/23/2022 0230   BILITOT 1.2 05/23/2022 0230   GFRNONAA 57 (L) 05/24/2022 0517   GFRAA (L) 06/26/2010 1504    53        The eGFR has been  calculated using the MDRD equation. This calculation has not been validated in all clinical situations. eGFR's persistently <60 mL/min signify possible Chronic Kidney Disease.    Lipid Panel     Component Value Date/Time   CHOL 147 02/23/2022 0947   TRIG 123.0 02/23/2022 0947   HDL 45.50 02/23/2022 0947   CHOLHDL 3 02/23/2022 0947   VLDL 24.6 02/23/2022 0947   LDLCALC 77 02/23/2022 0947   LDLCALC 107 (H) 05/05/2020 1110   LDLDIRECT 170.2 10/09/2007 1258     Imaging I have reviewed the images obtained:  CT-scan of the brain: No acute abnormality, atrophy and chronic microvascular disease  MRI examination of the brain: Acute infarct of left external capsule and radiating white matter tracts, old occipital infarcts  ECHO: 1. Left ventricular ejection fraction, by estimation, is 55 to 60%. The  left ventricle has normal function. The left ventricle has no regional  wall motion abnormalities. 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 Apical septal thickness 20 mm. No  evidence of apical thrombus. No evidence of apical aneurysm. No LVOT  obstruction. Left ventricular diastolic parameters are consistent with  Grade I diastolic dysfunction (impaired  relaxation).   2. Right ventricular systolic function is normal. The right ventricular  size is normal. There is normal pulmonary artery systolic pressure.   3. The mitral valve is grossly normal. No evidence of mitral valve  regurgitation. No evidence of mitral stenosis.   4. The aortic valve is tricuspid. There is mild thickening of the aortic  valve. Aortic valve regurgitation is trivial. No aortic stenosis is  present.   5. Aortic dilatation noted. There is moderate dilatation of the aortic  root, measuring 45 mm.   Assessment: 88 year old patient with history of dementia, glaucoma, HTN, renal insufficiency, CAD and BPH presents with right sided  weakness and multiple falls at home along with hypertensive emergency and confusion. MRI brain shows acute stroke in left external capsule.  Previous CTA shows 70% stenosis of left carotid and less than 50% stenosis of right carotid.  Will need to  repeat vessel imaging.    Impression:Acute ischemic stroke in patient with multiple risk factors and severe hypertension  Recommendations: Stroke/TIA Workup  - Permissive HTN x48 hrs from sx onset (was 9/9 AM) or until stroke ruled out by MRI goal BP <220/110. PRN labetalol or hydralazine if BP above these parameters. Avoid oral antihypertensives. Trend towards normotension after 48 hours. - CTA head and neck - Check A1c and LDL + add statin per guidelines (goal A1c < 7%, LDL < 70) - ASA 81 mg daily lifelong (stop if patient is on [facility] unless history of CAD) - Add plavix 300 mg loading dose + 75 mg daily, course TBD pending vessel imaging if no indication for AC - q4 hr neuro checks and NIHSS - STAT head CT for any change in neuro exam - Telemetry for afib, consider event monitor at discharge - PT/OT/SLP - Stroke education - Amb referral to neurology upon discharge    Pt seen by NP/Neuro and later by MD. Note/plan to be edited by MD as needed.   [place] , MSN, AGACNP-BC [facility] See Amion for schedule and pager information 05/24/2022 4:44 PM  NEUROHOSPITALIST ADDENDUM Performed a face to face diagnostic evaluation.   I have reviewed the contents of history and physical exam as documented by PA/ARNP/Resident and agree with above documentation.  I have discussed and formulated the above plan as documented. Edits to the note have been made as needed.  Impression/ey exam findings/Plan: much more somnolent for me but I am also seeing him at night. Per RN, did participate when cleaning earlier in the night shift. Does briefly wake up and make partial eye contact but goes back to sleep. Has a R facial droop and seems to be  moving R side much less than left. Likely etiology of the noted left external capsule stroke is small vessel disease. Recommend gradual normotension after 48hours. Dual antiplatelet treatment with aspirin 53m daily along with plavix 746mdaily x 21days, followed by aspirin 8156maily alone.  SalDonnetta SimpersD [facility] [phone] 7pm to 7am, please call on call as listed on AMION.

## 2022-05-24 NOTE — Evaluation (Signed)
Occupational Therapy Evaluation Patient Details Name: [name] [name] MRN: [id number] DOB: [birth year] Today's Date: 05/24/2022   History of Present Illness Patient is a 88 y/o male who presents on 9/10 due to falls and weakness. Admitted with encephalopathy secondary to HTN emergency. Brain MRI- acute infarct left external capsule and radiating white matter tracts.Marland Kitchen PMH includes HTN, bradycardia, CAD, cardiomyopathy, probable Alzheimer's.   Clinical Impression   Pt lives with his supportive wife, typically completes his own basic ADLs and walks without AD. His wife manages all IADLs. Pt has baseline low vision and impaired cognition. He lethargic upon assessment, likely due to medications for MRI. Pt presents with R side weakness and poor balance. He is dependent in all ADLs and requires 2 person assist for all mobility. Recommending AIR for intensive therapy prior to return home.      Recommendations for follow up therapy are one component of a multi-disciplinary discharge planning process, led by the attending physician.  Recommendations may be updated based on patient status, additional functional criteria and insurance authorization.   Follow Up Recommendations  Acute inpatient rehab (3hours/day)    Assistance Recommended at Discharge Frequent or constant Supervision/Assistance  Patient can return home with the following Two people to help with walking and/or transfers;Two people to help with bathing/dressing/bathroom;Assistance with cooking/housework;Direct supervision/assist for medications management;Direct supervision/assist for financial management;Assist for transportation;Help with stairs or ramp for entrance    Functional Status Assessment  Patient has had a recent decline in their functional status and demonstrates the ability to make significant improvements in function in a reasonable and predictable amount of time.  Equipment Recommendations  BSC/3in1;Wheelchair cushion  (measurements OT);Hospital bed;Wheelchair (measurements OT)    Recommendations for Other Services       Precautions / Restrictions Precautions Precautions: Fall Restrictions Weight Bearing Restrictions: No      Mobility Bed Mobility Overal bed mobility: Needs Assistance Bed Mobility: Supine to Sit, Sit to Supine     Supine to sit: Max assist, +2 for physical assistance, HOB elevated Sit to supine: Max assist, +2 for physical assistance, HOB elevated   General bed mobility comments: Assist of 2 to get to EOB due to lethargy.    Transfers Overall transfer level: Needs assistance Equipment used: 2 person hand held assist Transfers: Sit to/from Stand Sit to Stand: Max assist, +2 physical assistance           General transfer comment: Assist of 2 to power to standing with right lateral lean and right knee flexion.      Balance Overall balance assessment: Needs assistance Sitting-balance support: Feet supported, No upper extremity supported Sitting balance-Leahy Scale: Poor Sitting balance - Comments: cannot withstand any challenge to sitting balance Postural control: Posterior lean Standing balance support: During functional activity Standing balance-Leahy Scale: Zero Standing balance comment: Requires assist of 2 with right lateral lean and blocking right knee during dynamic tasks.                           ADL either performed or assessed with clinical judgement   ADL                                         General ADL Comments: currently requiring total assist     Vision Baseline Vision/History: 3 Glaucoma Ability to See in Adequate Light: 3 Highly impaired Patient Visual  Report: No change from baseline Additional Comments: pt with low vision at baseline     Perception     Praxis      Pertinent Vitals/Pain Pain Assessment Pain Assessment: Faces Faces Pain Scale: Hurts a little bit Pain Location: grimacing with  movement Pain Descriptors / Indicators: Grimacing Pain Intervention(s): Monitored during session     Hand Dominance Right   Extremity/Trunk Assessment Upper Extremity Assessment Upper Extremity Assessment: RUE deficits/detail;Difficult to assess due to impaired cognition;LUE deficits/detail RUE Deficits / Details: pt able to squeeze hand with 3+/5 grip strength, unable to conform to requirements for further testing RUE Coordination: decreased fine motor;decreased gross motor LUE Deficits / Details: generally weak proximally, but unable to test due to cognition, 4+/5 gross grasp   Lower Extremity Assessment Lower Extremity Assessment: Defer to PT evaluation RLE Deficits / Details: Difficulty with following commands for MMT but able to stand with knee flexion but no buckling in WB. Unable to place full weight on RLE without buckling functionally. No active ROM noted.   Cervical / Trunk Assessment Cervical / Trunk Assessment: Other exceptions Cervical / Trunk Exceptions: weakness   Communication Communication Communication: No difficulties   Cognition Arousal/Alertness: Lethargic, Suspect due to medications Behavior During Therapy: Flat affect Overall Cognitive Status: Difficult to assess                                 General Comments: Follows some commands with increased time and repetition. Lethargic due to being given medication (ativan) prior to MRI.     General Comments  Wife present during session.    Exercises     Shoulder Instructions      Home Living Family/patient expects to be discharged to:: Private residence Living Arrangements: Spouse/significant other Available Help at Discharge: Family;Available 24 hours/day Type of Home: House Home Access: Stairs to enter [employer] of Steps: 2 Entrance Stairs-Rails: Right;Left Home Layout: One level     Bathroom Shower/Tub: Walk-in shower;Door   [employer] Toilet: Handicapped height      Home Equipment: Shower seat;Hand held shower head;Cane - single point;Grab bars - tub/shower          Prior Functioning/Environment Prior Level of Function : Independent/Modified Independent             Mobility Comments: Independent. Has been working with HHPT ADLs Comments: wife manages meds and IADLs        OT Problem List: Decreased strength;Decreased activity tolerance;Impaired balance (sitting and/or standing);Decreased coordination;Impaired vision/perception;Decreased cognition;Decreased safety awareness;Decreased knowledge of use of DME or AE;Impaired UE functional use      OT Treatment/Interventions: Self-care/ADL training;DME and/or AE instruction;Therapeutic activities;Patient/family education;Balance training    OT Goals(Current goals can be found in the care plan section) Acute Rehab OT Goals OT Goal Formulation: With family Time For Goal Achievement: 06/07/22 Potential to Achieve Goals: Fair ADL Goals Pt Will Perform Eating: with min assist;sitting Pt Will Perform Grooming: with min assist;sitting Pt Will Perform Upper Body Bathing: with mod assist;sitting Pt Will Perform Upper Body Dressing: with mod assist;sitting Pt Will Transfer to Toilet: with mod assist;stand pivot transfer;bedside commode Pt/caregiver will Perform Home Exercise Program: Increased strength;Right Upper extremity;With minimal assist Additional ADL Goal #1: Pt will demonstrate fair sitting balance at EOB as a precursor to ADLs. Additional ADL Goal #2: Pt will follow one step commands with 50% accuracy.  OT Frequency: Min 2X/week    Co-evaluation PT/OT/SLP Co-Evaluation/Treatment: Yes Reason for  Co-Treatment: For patient/therapist safety;Necessary to address cognition/behavior during functional activity   OT goals addressed during session: Strengthening/ROM;ADL's and self-care      AM-PAC OT "6 Clicks" Daily Activity     Outcome Measure Help from another person eating meals?:  Total Help from another person taking care of personal grooming?: Total Help from another person toileting, which includes using toliet, bedpan, or urinal?: Total Help from another person bathing (including washing, rinsing, drying)?: Total Help from another person to put on and taking off regular upper body clothing?: Total Help from another person to put on and taking off regular lower body clothing?: Total 6 Click Score: 6   End of Session    Activity Tolerance: Patient limited by lethargy Patient left: in bed;with call bell/phone within reach;with bed alarm set;with family/visitor present  OT Visit Diagnosis: Unsteadiness on feet (R26.81);Other abnormalities of gait and mobility (R26.89);Hemiplegia and hemiparesis;Muscle weakness (generalized) (M62.81);Other symptoms and signs involving cognitive function Hemiplegia - Right/Left: Right Hemiplegia - dominant/non-dominant: Dominant Hemiplegia - caused by: Cerebral infarction                Time: 6153-7943 OT Time Calculation (min): 22 min Charges:  OT General Charges $OT Visit: 1 Visit OT Evaluation $OT Eval Moderate Complexity: 1 Mod  Cleta Alberts, OTR/L Acute Rehabilitation Services Office: [phone]  Malka So 05/24/2022, 4:27 PM

## 2022-05-24 NOTE — Progress Notes (Signed)
? ?  Inpatient Rehab Admissions Coordinator : ? ?Per therapy recommendations, patient was screened for CIR candidacy by Lorie Cleckley RN MSN.  At this time patient appears to be a potential candidate for CIR. I will place a rehab consult per protocol for full assessment. Please call me with any questions. ? ?Catalena Stanhope RN MSN ?Admissions Coordinator ?[phone] ?  ?

## 2022-05-24 NOTE — Evaluation (Signed)
Physical Therapy Evaluation Patient Details Name: [name] [name] MRN: [id number] DOB: 07-20-1932 Today's Date: 05/24/2022  History of Present Illness  Patient is a 88 y/o male who presents on 9/10 due to falls and weakness. Admitted with encephalopathy secondary to HTN emergency. Brain MRI- acute infarct left external capsule and radiating white matter tracts.Marland Kitchen PMH includes HTN, bradycardia, CAD, cardiomyopathy, probable Alzheimer's.  Clinical Impression  Patient presents with right sided weakness, lethargy, impaired balance and impaired mobility s/p above. Pt lives at home with his wife and wife provides PLOF/history. Pt was independent with walking and ADLs PTA. Does not drive due to glaucoma or cook/clean. Today, pt requires Max A of 2 for bed mobility, standing and taking a few steps along side bed with right lateral lean and difficulty advancing RLE as well as knee buckling with full WB. Pt has supportive wife. Hoping pt will do better when more alert. Would benefit from AIR to maximize independence and mobility prior to return home. Will follow acutely.       Recommendations for follow up therapy are one component of a multi-disciplinary discharge planning process, led by the attending physician.  Recommendations may be updated based on patient status, additional functional criteria and insurance authorization.  Follow Up Recommendations Acute inpatient rehab (3hours/day)      Assistance Recommended at Discharge Frequent or constant Supervision/Assistance  Patient can return home with the following  Two people to help with walking and/or transfers;A lot of help with bathing/dressing/bathroom;Help with stairs or ramp for entrance;Direct supervision/assist for financial management;Direct supervision/assist for medications management    Equipment Recommendations Other (comment) (defer to next venue)  Recommendations for Other Services  Rehab consult    Functional Status Assessment Patient  has had a recent decline in their functional status and demonstrates the ability to make significant improvements in function in a reasonable and predictable amount of time.     Precautions / Restrictions Precautions Precautions: Fall Restrictions Weight Bearing Restrictions: No      Mobility  Bed Mobility Overal bed mobility: Needs Assistance Bed Mobility: Supine to Sit, Sit to Supine     Supine to sit: Max assist, +2 for physical assistance, HOB elevated Sit to supine: Max assist, +2 for physical assistance, HOB elevated   General bed mobility comments: Assist of 2 to get to EOB due to lethargy.    Transfers Overall transfer level: Needs assistance Equipment used: 2 person hand held assist Transfers: Sit to/from Stand Sit to Stand: Max assist, +2 physical assistance           General transfer comment: Assist of 2 to power to standing with right lateral lean and right knee flexion.    Ambulation/Gait Ambulation/Gait assistance: Max assist, +2 physical assistance Gait Distance (Feet): 3 Feet Assistive device: 2 person hand held assist         General Gait Details: Able to side step along side bed with manual assist to move RLE and able to advance LLE with therapist stabilizing right knee due to buckling.  Stairs            Wheelchair Mobility    Modified Rankin (Stroke Patients Only)       Balance Overall balance assessment: Needs assistance Sitting-balance support: Feet supported, No upper extremity supported Sitting balance-Leahy Scale: Fair Sitting balance - Comments: Requires Mod A-Min guard assist, slight posterior lean. Postural control: Posterior lean Standing balance support: During functional activity Standing balance-Leahy Scale: Zero Standing balance comment: Requires assist of 2 with right  lateral lean and blocking right knee during dynamic tasks.                             Pertinent Vitals/Pain Pain Assessment Pain  Assessment: Faces Faces Pain Scale: Hurts a little bit Pain Location: grimacing with movement Pain Descriptors / Indicators: Grimacing Pain Intervention(s): Monitored during session    Home Living Family/patient expects to be discharged to:: Private residence Living Arrangements: Spouse/significant other Available Help at Discharge: Family;Available 24 hours/day Type of Home: House Home Access: Stairs to enter Entrance Stairs-Rails: Psychiatric nurse of Steps: 2   Home Layout: One level Home Equipment: Shower seat;Hand held shower head;Cane - single point;Grab bars - tub/shower      Prior Function Prior Level of Function : Independent/Modified Independent             Mobility Comments: Independent. Has been working with HHPT ADLs Comments: wife manages meds and IADLs     Hand Dominance   Dominant Hand: Right    Extremity/Trunk Assessment   Upper Extremity Assessment Upper Extremity Assessment: Defer to OT evaluation    Lower Extremity Assessment Lower Extremity Assessment: RLE deficits/detail;Difficult to assess due to impaired cognition RLE Deficits / Details: Difficulty with following commands for MMT but able to stand with knee flexion but no buckling in WB. Unable to place full weight on RLE without buckling functionally. No active ROM noted.       Communication   Communication: No difficulties  Cognition Arousal/Alertness: Lethargic, Suspect due to medications Behavior During Therapy: Flat affect Overall Cognitive Status: Difficult to assess                                 General Comments: Follows some commands with increased time and repetition. Lethargic due to being given medication (ativan) prior to MRI.        General Comments General comments (skin integrity, edema, etc.): Wife present during session.    Exercises     Assessment/Plan    PT Assessment Patient needs continued PT services  PT Problem List  Decreased range of motion;Decreased strength;Decreased mobility;Impaired tone;Decreased activity tolerance;Decreased balance;Decreased cognition       PT Treatment Interventions Therapeutic activities;Gait training;Therapeutic exercise;Patient/family education;Balance training;Functional mobility training;Neuromuscular re-education;DME instruction    PT Goals (Current goals can be found in the Care Plan section)  Acute Rehab PT Goals Patient Stated Goal: to get better and come home PT Goal Formulation: With family Time For Goal Achievement: 06/07/22 Potential to Achieve Goals: Fair    Frequency Min 4X/week     Co-evaluation               AM-PAC PT "6 Clicks" Mobility  Outcome Measure Help needed turning from your back to your side while in a flat bed without using bedrails?: Total Help needed moving from lying on your back to sitting on the side of a flat bed without using bedrails?: Total Help needed moving to and from a bed to a chair (including a wheelchair)?: Total Help needed standing up from a chair using your arms (e.g., wheelchair or bedside chair)?: Total Help needed to walk in hospital room?: Total Help needed climbing 3-5 steps with a railing? : Total 6 Click Score: 6    End of Session   Activity Tolerance: Patient limited by lethargy Patient left: in bed;with call bell/phone within reach;with bed alarm set;with family/visitor  present Nurse Communication: Mobility status PT Visit Diagnosis: Hemiplegia and hemiparesis;Difficulty in walking, not elsewhere classified (R26.2);Muscle weakness (generalized) (M62.81) Hemiplegia - Right/Left: Right Hemiplegia - dominant/non-dominant: Dominant Hemiplegia - caused by: Cerebral infarction    Time: 0488-8916 PT Time Calculation (min) (ACUTE ONLY): 22 min   Charges:   PT Evaluation $PT Eval Moderate Complexity: 1 Mod          Marisa Severin, PT, DPT Acute Rehabilitation Services Secure chat preferred Office  [place] 05/24/2022, 3:57 PM

## 2022-05-24 NOTE — Progress Notes (Signed)
PROGRESS NOTE    [name] [name]  ZOX:[id number] DOB: 01/25/1932 DOA: 05/23/2022 PCP: [name], DO   Brief Narrative: Patient was admitted by [facility] 05/24/2019 [place] pickup 05/24/2022 88 year old male lives at home with his wife with history of dementia, CKD stage IIIb, CAD, BPH admitted with generalized weakness.  Family reported multiple falls prior to admission.  There was a concern for more right-sided weakness than left-sided.  On admission his blood pressure was 232/111 and he was confused.  He was initially started on nicardipine infusion.  Thereafter he was on clonidine Norvasc and metoprolol.  However metoprolol had to be held due to bradycardia.  Assessment & Plan:   Principal Problem:   Hypertensive emergency   #1 hypertensive emergency patient was treated with nicardipine drip.  Currently on Norvasc 10 mg daily, clonidine 0.1 mg twice a day, metoprolol 50 mg daily.  Metoprolol was held due to bradycardia.  Increase clonidine 2.1 mg 3 times a day.  #2 bradycardia hold metoprolol.  Check echo.  Troponin flat.  #3 right-sided weakness concern for stroke-wife reports this is new since Saturday prior to admission.  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  He had an MRI in 04/01/2022 without any acute findings. Will repeat MRI of the brain to rule out new stroke with a new right-sided weakness. Start aspirin 81 mg daily. Check lipid panel hemoglobin A1c PT consult OT consult Speech consult MRI brain Carotid ultrasound Echocardiogram  #4 hypokalemia potassium 3.4 replete mag 2.0  #5 open angle glaucoma continue eyedrops    #6 history of dementia with recurrent falls on Depakote and Seroquel  #7 depression on Zoloft   Estimated body mass index is 19.37 kg/m as calculated from the following:   Height as of 04/29/22: '5\' 8"'$  (1.727 m).   Weight as of this encounter: 57.8 kg.  DVT prophylaxis: Heparin  code  Status: Full code  family Communication: Discussed with wife on the phone disposition Plan:  Status is: Inpatient Remains inpatient appropriate because: Hypertensive emergency   Consultants:  PCCM  Procedures: None Antimicrobials: None  Subjective: Patient is resting in bed  Overnight had some bradycardia Beta-blocker held this morning  Objective: Vitals:   05/24/22 0700 05/24/22 0745 05/24/22 0800 05/24/22 0905  BP: 116/66  (!) 153/100 (!) 190/90  Pulse: (!) 44  (!) 52 (!) 55  Resp: '12  16 18  '$ Temp:  97.6 F (36.4 C)    TempSrc:  Oral    SpO2: 100%  100% 100%  Weight:        Intake/Output Summary (Last 24 hours) at 05/24/2022 0941 Last data filed at 05/24/2022 0532 Gross per 24 hour  Intake 936.39 ml  Output 1415 ml  Net -478.61 ml   Filed Weights   05/24/22 0500  Weight: 57.8 kg    Examination:  General exam: Appears in no acute distress Respiratory system: Clear to auscultation. Respiratory effort normal. Cardiovascular system: S1 & S2 heard, RRR. No JVD, murmurs, rubs, gallops or clicks. No pedal edema. Gastrointestinal system: Abdomen is nondistended, soft and nontender. No organomegaly or masses felt. Normal bowel sounds heard. Central nervous system: Responds to some commands Wiggles his left side toes does not move his right lower extremity extremities: No edema Skin: No rashes, lesions or ulcers Psychiatry: Unable to assess   Data Reviewed: I have personally reviewed following labs and imaging studies  CBC: Recent Labs  Lab 05/23/22 0230  05/23/22 0830 05/24/22 0517  WBC 6.3 14.8* 7.6  NEUTROABS 4.2  --   --   HGB 12.2* 13.4 11.3*  HCT 36.6* 38.8* 34.0*  MCV 92.7 91.3 93.2  PLT 211 195 099   Basic Metabolic Panel: Recent Labs  Lab 05/23/22 0230 05/23/22 0830 05/24/22 0517  NA 140 139 137  K 3.6 3.3* 3.4*  CL 104 107 108  CO2 '23 22 22  '$ GLUCOSE 96 134* 82  BUN '16 14 13  '$ CREATININE 1.44* 1.20 1.22  CALCIUM 8.8* 8.6* 8.5*  MG  --   1.9 2.0   GFR: Estimated Creatinine Clearance: 34.2 mL/min (by C-G formula based on SCr of 1.22 mg/dL). Liver Function Tests: Recent Labs  Lab 05/23/22 0230  AST 18  ALT 11  ALKPHOS 46  BILITOT 1.2  PROT 7.1  ALBUMIN 3.4*   No results for input(s): "LIPASE", "AMYLASE" in the last 168 hours. No results for input(s): "AMMONIA" in the last 168 hours. Coagulation Profile: No results for input(s): "INR", "PROTIME" in the last 168 hours. Cardiac Enzymes: No results for input(s): "CKTOTAL", "CKMB", "CKMBINDEX", "TROPONINI" in the last 168 hours. BNP (last 3 results) No results for input(s): "PROBNP" in the last 8760 hours. HbA1C: No results for input(s): "HGBA1C" in the last 72 hours. CBG: Recent Labs  Lab 05/23/22 0624 05/23/22 0751  GLUCAP 120* 135*   Lipid Profile: No results for input(s): "CHOL", "HDL", "LDLCALC", "TRIG", "CHOLHDL", "LDLDIRECT" in the last 72 hours. Thyroid Function Tests: No results for input(s): "TSH", "T4TOTAL", "FREET4", "T3FREE", "THYROIDAB" in the last 72 hours. Anemia Panel: No results for input(s): "VITAMINB12", "FOLATE", "FERRITIN", "TIBC", "IRON", "RETICCTPCT" in the last 72 hours. Sepsis Labs: No results for input(s): "PROCALCITON", "LATICACIDVEN" in the last 168 hours.  Recent Results (from the past 240 hour(s))  SARS Coronavirus 2 by RT PCR (hospital order, performed in [facility] hospital lab) *cepheid single result test* Anterior Nasal Swab     Status: None   Collection Time: 05/23/22  5:18 AM   Specimen: Anterior Nasal Swab  Result Value Ref Range Status   SARS Coronavirus 2 by RT PCR NEGATIVE NEGATIVE Final    Comment: (NOTE) SARS-CoV-2 target nucleic acids are NOT DETECTED.  The SARS-CoV-2 RNA is generally detectable in upper and lower respiratory specimens during the acute phase of infection. The lowest concentration of SARS-CoV-2 viral copies this assay can detect is 250 copies / mL. A negative result does not preclude SARS-CoV-2  infection and should not be used as the sole basis for treatment or other patient management decisions.  A negative result may occur with improper specimen collection / handling, submission of specimen other than nasopharyngeal swab, presence of viral mutation(s) within the areas targeted by this assay, and inadequate number of viral copies (<250 copies / mL). A negative result must be combined with clinical observations, patient history, and epidemiological information.  Fact Sheet for Patients:   https://www.patel.info/  Fact Sheet for Healthcare Providers: https://hall.com/  This test is not yet approved or  cleared by the [place] FDA and has been authorized for detection and/or diagnosis of SARS-CoV-2 by FDA under an Emergency Use Authorization (EUA).  This EUA will remain in effect (meaning this test can be used) for the duration of the COVID-19 declaration under Section 564(b)(1) of the Act, 21 U.S.C. section 360bbb-3(b)(1), unless the authorization is terminated or revoked sooner.  Performed at [facility], [place] [address], [place], [place] [address]   MRSA Next Gen by PCR,  Nasal     Status: None   Collection Time: 05/23/22  9:20 AM   Specimen: Nasal Mucosa; Nasal Swab  Result Value Ref Range Status   MRSA by PCR Next Gen NOT DETECTED NOT DETECTED Final    Comment: (NOTE) The GeneXpert MRSA Assay (FDA approved for NASAL specimens only), is one component of a comprehensive MRSA colonization surveillance program. It is not intended to diagnose MRSA infection nor to guide or monitor treatment for MRSA infections. Test performance is not FDA approved in patients less than 22 years old. Performed at [facility], [place] [address], [place], [place] [address]          Radiology Studies: CT Head Wo Contrast  Result Date: 05/23/2022 CLINICAL DATA:  Multiple falls. Fatigue and weakness. Hypertension. EXAM: CT HEAD  WITHOUT CONTRAST CT CERVICAL SPINE WITHOUT CONTRAST TECHNIQUE: Multidetector CT imaging of the head and cervical spine was performed following the standard protocol without intravenous contrast. Multiplanar CT image reconstructions of the cervical spine were also generated. RADIATION DOSE REDUCTION: This exam was performed according to the departmental dose-optimization program which includes automated exposure control, adjustment of the mA and/or kV according to patient size and/or use of iterative reconstruction technique. COMPARISON:  04/05/2022 FINDINGS: CT HEAD FINDINGS Brain: No evidence of acute infarction, hemorrhage, hydrocephalus, extra-axial collection or mass lesion/mass effect. Remote right occipital lobe infarct. There is mild diffuse low-attenuation within the subcortical and periventricular white matter compatible with chronic microvascular disease. Prominence of sulci and ventricles compatible with brain atrophy. Vascular: No hyperdense vessel or unexpected calcification. Skull: Normal. Negative for fracture or focal lesion. Sinuses/Orbits: No acute finding. Other: None. CT CERVICAL SPINE FINDINGS Alignment: Normal alignment. Skull base and vertebrae: The vertebral body heights and disc spaces are well preserved. Facet joints are all aligned. No acute fracture or dislocation. Soft tissues and spinal canal: No prevertebral fluid or swelling. No visible canal hematoma. Disc levels: Disc space narrowing and endplate spurring is identified at C4-5, C5-6 and C6-7. Upper chest: Negative. Other: None IMPRESSION: 1. No acute intracranial abnormality. 2. Chronic microvascular disease and brain atrophy. 3. Remote right occipital lobe infarct. 4. No evidence for cervical spine fracture or dislocation. 5. Cervical degenerative disc disease. Electronically Signed   By: Kerby Moors M.D.   On: 05/23/2022 04:40   CT Cervical Spine Wo Contrast  Result Date: 05/23/2022 CLINICAL DATA:  Multiple falls. Fatigue  and weakness. Hypertension. EXAM: CT HEAD WITHOUT CONTRAST CT CERVICAL SPINE WITHOUT CONTRAST TECHNIQUE: Multidetector CT imaging of the head and cervical spine was performed following the standard protocol without intravenous contrast. Multiplanar CT image reconstructions of the cervical spine were also generated. RADIATION DOSE REDUCTION: This exam was performed according to the departmental dose-optimization program which includes automated exposure control, adjustment of the mA and/or kV according to patient size and/or use of iterative reconstruction technique. COMPARISON:  04/05/2022 FINDINGS: CT HEAD FINDINGS Brain: No evidence of acute infarction, hemorrhage, hydrocephalus, extra-axial collection or mass lesion/mass effect. Remote right occipital lobe infarct. There is mild diffuse low-attenuation within the subcortical and periventricular white matter compatible with chronic microvascular disease. Prominence of sulci and ventricles compatible with brain atrophy. Vascular: No hyperdense vessel or unexpected calcification. Skull: Normal. Negative for fracture or focal lesion. Sinuses/Orbits: No acute finding. Other: None. CT CERVICAL SPINE FINDINGS Alignment: Normal alignment. Skull base and vertebrae: The vertebral body heights and disc spaces are well preserved. Facet joints are all aligned. No acute fracture or dislocation. Soft tissues and spinal canal:  No prevertebral fluid or swelling. No visible canal hematoma. Disc levels: Disc space narrowing and endplate spurring is identified at C4-5, C5-6 and C6-7. Upper chest: Negative. Other: None IMPRESSION: 1. No acute intracranial abnormality. 2. Chronic microvascular disease and brain atrophy. 3. Remote right occipital lobe infarct. 4. No evidence for cervical spine fracture or dislocation. 5. Cervical degenerative disc disease. Electronically Signed   By: Kerby Moors M.D.   On: 05/23/2022 04:40   DG Chest Portable 1 View  Result Date:  05/23/2022 CLINICAL DATA:  Hypertension, weakness EXAM: PORTABLE CHEST 1 VIEW COMPARISON:  06/26/2010 FINDINGS: Left upper lobe scarring. Right lung is clear. No pleural effusion or pneumothorax. The heart is normal in size.  Thoracic aortic atherosclerosis. IMPRESSION: No evidence of acute cardiopulmonary disease. Left upper lobe scarring. Electronically Signed   By: Julian Hy M.D.   On: 05/23/2022 03:39        Scheduled Meds:  amLODipine  10 mg Oral Daily   brimonidine  1 drop Both Eyes BID   Chlorhexidine Gluconate Cloth  6 each Topical Daily   cloNIDine  0.1 mg Oral BID   divalproex  125 mg Oral BID   dorzolamide-timolol  1 drop Both Eyes BID   heparin  5,000 Units Subcutaneous Q8H   metoprolol succinate  50 mg Oral Daily   QUEtiapine  50 mg Oral QHS   sertraline  50 mg Oral Daily   Continuous Infusions:  niCARDipine Stopped (05/23/22 1223)     LOS: 1 day    Time spent: 35 minutes    Georgette Shell, MD 05/24/2022, 9:41 AM

## 2022-05-25 ENCOUNTER — Inpatient Hospital Stay (HOSPITAL_COMMUNITY): Payer: Medicare Other

## 2022-05-25 ENCOUNTER — Ambulatory Visit: Payer: Federal, State, Local not specified - PPO | Admitting: Family Medicine

## 2022-05-25 DIAGNOSIS — E876 Hypokalemia: Secondary | ICD-10-CM

## 2022-05-25 DIAGNOSIS — N289 Disorder of kidney and ureter, unspecified: Secondary | ICD-10-CM

## 2022-05-25 DIAGNOSIS — Z7189 Other specified counseling: Secondary | ICD-10-CM

## 2022-05-25 DIAGNOSIS — W19XXXA Unspecified fall, initial encounter: Secondary | ICD-10-CM

## 2022-05-25 DIAGNOSIS — F0390 Unspecified dementia without behavioral disturbance: Secondary | ICD-10-CM

## 2022-05-25 DIAGNOSIS — R4702 Dysphasia: Secondary | ICD-10-CM

## 2022-05-25 DIAGNOSIS — Z515 Encounter for palliative care: Secondary | ICD-10-CM

## 2022-05-25 DIAGNOSIS — F32A Depression, unspecified: Secondary | ICD-10-CM

## 2022-05-25 DIAGNOSIS — I639 Cerebral infarction, unspecified: Secondary | ICD-10-CM

## 2022-05-25 DIAGNOSIS — R4182 Altered mental status, unspecified: Secondary | ICD-10-CM | POA: Diagnosis not present

## 2022-05-25 DIAGNOSIS — I161 Hypertensive emergency: Secondary | ICD-10-CM | POA: Diagnosis not present

## 2022-05-25 LAB — LIPID PANEL
Cholesterol: 174 mg/dL (ref 0–200)
HDL: 41 mg/dL (ref >40)
LDL Cholesterol: 120 mg/dL — ABNORMAL HIGH (ref 0–99)
Total CHOL/HDL Ratio: 4.2 RATIO
Triglycerides: 66 mg/dL (ref <150)
VLDL: 13 mg/dL (ref 0–40)

## 2022-05-25 LAB — BASIC METABOLIC PANEL
Anion gap: 10 (ref 5–15)
BUN: 20 mg/dL (ref 8–23)
CO2: 22 mmol/L (ref 22–32)
Calcium: 8.9 mg/dL (ref 8.9–10.3)
Chloride: 106 mmol/L (ref 98–111)
Creatinine, Ser: 1.31 mg/dL — ABNORMAL HIGH (ref 0.61–1.24)
GFR, Estimated: 52 mL/min — ABNORMAL LOW (ref >60)
Glucose, Bld: 79 mg/dL (ref 70–99)
Potassium: 4.3 mmol/L (ref 3.5–5.1)
Sodium: 138 mmol/L (ref 135–145)

## 2022-05-25 LAB — CBC
HCT: 38.9 % — ABNORMAL LOW (ref 39.0–52.0)
Hemoglobin: 13.5 g/dL (ref 13.0–17.0)
MCH: 31.5 pg (ref 26.0–34.0)
MCHC: 34.7 g/dL (ref 30.0–36.0)
MCV: 90.7 fL (ref 80.0–100.0)
Platelets: 200 10*3/uL (ref 150–400)
RBC: 4.29 MIL/uL (ref 4.22–5.81)
RDW: 12.6 % (ref 11.5–15.5)
WBC: 9.2 10*3/uL (ref 4.0–10.5)
nRBC: 0 % (ref 0.0–0.2)

## 2022-05-25 MED ORDER — CLOPIDOGREL BISULFATE 75 MG PO TABS
300.0000 mg | ORAL_TABLET | Freq: Once | ORAL | Status: AC
  Administered 2022-05-25: 300 mg via ORAL
  Filled 2022-05-25: qty 4

## 2022-05-25 MED ORDER — LABETALOL HCL 5 MG/ML IV SOLN: 20.0000 mg | INTRAVENOUS | Status: DC | PRN

## 2022-05-25 MED ORDER — HYDRALAZINE HCL 20 MG/ML IJ SOLN
10.0000 mg | Freq: Four times a day (QID) | INTRAMUSCULAR | Status: DC | PRN
  Administered 2022-05-26 (×2): 10 mg via INTRAVENOUS
  Filled 2022-05-25 (×2): qty 1

## 2022-05-25 MED ORDER — IOHEXOL 350 MG/ML SOLN
75.0000 mL | Freq: Once | INTRAVENOUS | Status: AC | PRN
  Administered 2022-05-25: 75 mL via INTRAVENOUS

## 2022-05-25 MED ORDER — CLOPIDOGREL BISULFATE 75 MG PO TABS
75.0000 mg | ORAL_TABLET | Freq: Every day | ORAL | Status: DC
  Administered 2022-05-26 – 2022-05-27 (×2): 75 mg via ORAL
  Filled 2022-05-25 (×2): qty 1

## 2022-05-25 MED ORDER — ATORVASTATIN CALCIUM 80 MG PO TABS
80.0000 mg | ORAL_TABLET | Freq: Every day | ORAL | Status: DC
  Administered 2022-05-26 – 2022-05-27 (×2): 80 mg via ORAL
  Filled 2022-05-25 (×2): qty 1

## 2022-05-25 MED ORDER — IOHEXOL 350 MG/ML SOLN
100.0000 mL | Freq: Once | INTRAVENOUS | Status: AC | PRN
  Administered 2022-05-25: 50 mL via INTRAVENOUS

## 2022-05-25 NOTE — Progress Notes (Signed)
Speech Language Pathology Treatment: Dysphagia  Patient Details Name: [name] [name] MRN: [id number] DOB: Jun 06, 1932 Today's Date: 05/25/2022 Time: 3295-1884 SLP Time Calculation (min) (ACUTE ONLY): 8 min  Assessment / Plan / Recommendation Clinical Impression  Contacted by RN due to [name] [name] deteriorated swallowing today.  Upon arrival to room he was talking incoherently with low volume and poor intelligibility. He was retaining saliva in oral cavity with quantities spilling down his face and on to hospital gown. Cleaned face/chest and suctioned mouth. He accepted sips of water, leading to oral holding with eventual swallow response and weak coughing.  He required cues to manipulate applesauce and generate a swallow - there was no coughing after consuming purees.   He is just not sufficiently alert to safely eat/drink, and there are concerns today for potential aspiration with liquids. Recommend continuing NPO; allow critical meds crushed in applesauce if he is awake. D/W RN. SLP will follow.    HPI HPI: 88 year old male lives at home with his wife admitted with hypertension, generalized weakness but concern for predominant right-sided weakness. MRI pending.  Pmhx dementia, CKD stage IIIb, CAD, BPH admitted with generalized weakness.  Family reported multiple falls prior to admission.      SLP Plan  Continue with current plan of care      Recommendations for follow up therapy are one component of a multi-disciplinary discharge planning process, led by the attending physician.  Recommendations may be updated based on patient status, additional functional criteria and insurance authorization.    Recommendations  Diet recommendations: NPO Medication Administration: Crushed with puree Supervision: Trained caregiver to feed patient                Oral Care Recommendations: Oral care QID Assistance recommended at discharge: Frequent or constant Supervision/Assistance SLP Visit  Diagnosis: Dysphagia, unspecified (R13.10) Plan: Continue with current plan of care         [name] Findlay L. Tivis [name], [name] [name] Clinical Specialist - Acute Care SLP Acute Rehabilitation Services Office number [phone]   [name] [name]  05/25/2022, 11:34 AM

## 2022-05-25 NOTE — Progress Notes (Addendum)
STROKE TEAM PROGRESS NOTE   INTERVAL HISTORY No family at the bedside. PT is planning to work with patient this morning. EEG ordered.   1008: Code stroke for neuro change. Transfer orders canceled, re-evaluate tomorrow. NPO until speech re-evaluates   Vitals:   05/25/22 0316 05/25/22 0400 05/25/22 0500 05/25/22 0740  BP:  (!) 141/82 126/78   Pulse:  (!) 192    Resp:  14 12   Temp: 97.7 F (36.5 C)   97.8 F (36.6 C)  TempSrc: Oral   Oral  SpO2:  100%    Weight:   55.8 kg    CBC:  Recent Labs  Lab 05/23/22 0230 05/23/22 0830 05/24/22 0517 05/25/22 0618  WBC 6.3   < > 7.6 9.2  NEUTROABS 4.2  --   --   --   HGB 12.2*   < > 11.3* 13.5  HCT 36.6*   < > 34.0* 38.9*  MCV 92.7   < > 93.2 90.7  PLT 211   < > 163 200   < > = values in this interval not displayed.   Basic Metabolic Panel:  Recent Labs  Lab 05/23/22 0830 05/24/22 0517 05/25/22 0618  NA 139 137 138  K 3.3* 3.4* 4.3  CL 107 108 106  CO2 '22 22 22  '$ GLUCOSE 134* 82 79  BUN '14 13 20  '$ CREATININE 1.20 1.22 1.31*  CALCIUM 8.6* 8.5* 8.9  MG 1.9 2.0  --    Lipid Panel:  Recent Labs  Lab 05/25/22 0618  CHOL 174  TRIG 66  HDL 41  CHOLHDL 4.2  VLDL 13  LDLCALC 120*   HgbA1c:  Recent Labs  Lab 05/24/22 0517  HGBA1C 4.6*   Urine Drug Screen: No results for input(s): "LABOPIA", "COCAINSCRNUR", "LABBENZ", "AMPHETMU", "THCU", "LABBARB" in the last 168 hours.  Alcohol Level No results for input(s): "ETH" in the last 168 hours.  IMAGING past 24 hours CT ANGIO HEAD NECK W WO CM  Result Date: 05/25/2022 CLINICAL DATA:  Stroke follow-up EXAM: CT ANGIOGRAPHY HEAD AND NECK TECHNIQUE: Multidetector CT imaging of the head and neck was performed using the standard protocol during bolus administration of intravenous contrast. Multiplanar CT image reconstructions and MIPs were obtained to evaluate the vascular anatomy. Carotid stenosis measurements (when applicable) are obtained utilizing NASCET criteria, using the  distal internal carotid diameter as the denominator. RADIATION DOSE REDUCTION: This exam was performed according to the departmental dose-optimization program which includes automated exposure control, adjustment of the mA and/or kV according to patient size and/or use of iterative reconstruction technique. CONTRAST:  75m OMNIPAQUE IOHEXOL 350 MG/ML SOLN COMPARISON:  None Available. FINDINGS: CT HEAD FINDINGS Brain: There is no mass, hemorrhage or extra-axial collection. The size and configuration of the ventricles and extra-axial CSF spaces are normal. There are multiple old infarcts, including a right occipital infarct. There is hypoattenuation of the periventricular white matter, most commonly indicating chronic ischemic microangiopathy. Skull: The visualized skull base, calvarium and extracranial soft tissues are normal. Sinuses/Orbits: No fluid levels or advanced mucosal thickening of the visualized paranasal sinuses. No mastoid or middle ear effusion. There are bilateral lens replacements. CTA NECK FINDINGS SKELETON: There is no bony spinal canal stenosis. No lytic or blastic lesion. OTHER NECK: Normal pharynx, larynx and major salivary glands. No cervical lymphadenopathy. Unremarkable thyroid gland. UPPER CHEST: No pneumothorax or pleural effusion. No nodules or masses. AORTIC ARCH: There is no calcific atherosclerosis of the aortic arch. There is no aneurysm, dissection or hemodynamically  significant stenosis of the visualized portion of the aorta. Conventional 3 vessel aortic branching pattern. The visualized proximal subclavian arteries are widely patent. RIGHT CAROTID SYSTEM: No dissection, occlusion or aneurysm. Mild atherosclerotic calcification at the carotid bifurcation without hemodynamically significant stenosis. LEFT CAROTID SYSTEM: No dissection, occlusion or aneurysm. There is mixed density atherosclerosis extending into the proximal ICA, resulting in 50% stenosis. VERTEBRAL ARTERIES: Codominant  configuration. Both origins are clearly patent. No focal abnormality. CTA HEAD FINDINGS POSTERIOR CIRCULATION: --Vertebral arteries: Normal V4 segments. --Inferior cerebellar arteries: Normal. --Basilar artery: Normal. --Superior cerebellar arteries: Normal. --Posterior cerebral arteries (PCA): Multifocal severe stenosis of the right PCA. There is a critical stenosis versus short segment occlusion of the right P2 segment with maintained distal opacification. The left PCA is patent. ANTERIOR CIRCULATION: --Intracranial internal carotid arteries: Atherosclerotic calcification of the internal carotid arteries at the skull base without hemodynamically significant stenosis. --Anterior cerebral arteries (ACA): Normal. Both A1 segments are present. Patent anterior communicating artery (a-comm). --Middle cerebral arteries (MCA): Normal. VENOUS SINUSES: As permitted by contrast timing, patent. ANATOMIC VARIANTS: None Review of the MIP images confirms the above findings. IMPRESSION: 1. Multifocal severe stenosis of the right PCA with critical stenosis versus short segment occlusion of the right PCA P2 segment. Distal opacification is maintained. 2. Approximately 50% stenosis of the proximal left internal carotid artery. 3. Multiple old infarcts and chronic ischemic microangiopathy. Electronically Signed   By: Ulyses Jarred M.D.   On: 05/25/2022 02:02   MR BRAIN WO CONTRAST  Result Date: 05/24/2022 CLINICAL DATA:  Stroke. Follow-up. Dementia. Renal disease. Generalized weakness with falling. EXAM: MRI HEAD WITHOUT CONTRAST TECHNIQUE: Multiplanar, multiecho pulse sequences of the brain and surrounding structures were obtained without intravenous contrast. COMPARISON:  Head CT yesterday.  MRI 03/20/2022. FINDINGS: Brain: Diffusion imaging shows acute infarction in the left external capsule and radiating white matter tracts. No other acute insult. No evidence of swelling or hemorrhage. No focal abnormality affects the  brainstem or cerebellum. Cerebral hemispheres show generalized atrophy with minimal chronic small-vessel ischemic changes within the white matter and old bilateral occipital infarctions, right larger than left. No mass. No hydrocephalus or extra-axial collection. Vascular: Major vessels at the base of the brain show flow. Skull and upper cervical spine: Negative Sinuses/Orbits: Clear/normal Other: None IMPRESSION: Acute infarction of the left external capsule and radiating white matter tracts. No mass effect or hemorrhage. Old occipital infarctions right larger than left and minimal chronic small-vessel changes of the white matter as seen previously. Electronically Signed   By: Nelson Chimes M.D.   On: 05/24/2022 13:18   ECHOCARDIOGRAM COMPLETE  Result Date: 05/24/2022    ECHOCARDIOGRAM REPORT   Patient Name:   EESA JUSTISS Date of Exam: 05/24/2022 Medical Rec #:  [id number]       Height:       68.0 in Accession #:    [id number]      Weight:       127.4 lb Date of Birth:  05/14/1932        BSA:          1.687 m Patient Age:    73 years        BP:           164/84 mmHg Patient Gender: M               HR:           50 bpm. Exam Location:  Inpatient Procedure: 2D Echo and Intracardiac Opacification Agent  Indications:    Abnormal ECG  History:        Patient has prior history of Echocardiogram examinations, most                 recent 11/26/2020. CAD, Arrythmias:Bradycardia; Risk                 Factors:Hypertension and Dyslipidemia.  Sonographer:    Harvie Junior Referring Phys: [id number] ELIZABETH G MATHEWS  Sonographer Comments: Technically difficult study due to poor echo windows. Image acquisition challenging due to respiratory motion. IMPRESSIONS  1. Left ventricular ejection fraction, by estimation, is 55 to 60%. The left ventricle has normal function. The left ventricle has no regional wall motion abnormalities. 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 Apical septal thickness 20 mm. No evidence of apical thrombus. No evidence of apical aneurysm. No LVOT obstruction. Left ventricular diastolic parameters are consistent with Grade I diastolic dysfunction (impaired relaxation).  2. Right ventricular systolic function is normal. The right ventricular size is normal. There is normal pulmonary artery systolic pressure.  3. The mitral valve is grossly normal. No evidence of mitral valve regurgitation. No evidence of mitral stenosis.  4. The aortic valve is tricuspid. There is mild thickening of the aortic valve. Aortic valve regurgitation is trivial. No aortic stenosis is present.  5. Aortic dilatation noted. There is moderate dilatation of the aortic root, measuring 45 mm. Comparison(s): Prior images reviewed side by side. Increase in maximal LV thickness assessed. Increase in aortic root and Sinus of Valsalva dilation. This study is alos foreshortened. FINDINGS  Left Ventricle: Left ventricular ejection fraction, by estimation, is 55 to 60%. The left ventricle has normal function. The left ventricle has no regional wall motion abnormalities. Definity contrast agent was given IV to delineate the left ventricular  endocardial borders. The left ventricular internal cavity size was normal in size. There is severe asymmetric left ventricular hypertrophy. Left ventricular diastolic parameters are consistent with Grade I diastolic dysfunction (impaired relaxation). Right Ventricle: The right ventricular size is normal. No increase in right ventricular wall thickness. Right ventricular systolic function is normal. There is normal pulmonary artery systolic pressure. The tricuspid regurgitant velocity is 2.14 m/s, and  with an assumed right atrial pressure of 8 mmHg, the estimated right ventricular systolic pressure is 32.3 mmHg. Left Atrium: Left atrial size was normal in size. Right Atrium: Right atrial size was normal in size. Pericardium: Trivial pericardial  effusion is present. Mitral Valve: The mitral valve is grossly normal. Mild mitral annular calcification. No evidence of mitral valve regurgitation. No evidence of mitral valve stenosis. Tricuspid Valve: The tricuspid valve is normal in structure. Tricuspid valve regurgitation is not demonstrated. No evidence of tricuspid stenosis. Aortic Valve: The aortic valve is tricuspid. There is mild thickening of the aortic valve. Aortic valve regurgitation is trivial. Aortic regurgitation PHT measures 923 msec. No aortic stenosis is present. Aortic valve mean gradient measures 2.0 mmHg. Aortic valve peak gradient measures 4.3 mmHg. Aortic valve area, by VTI measures 3.84 cm. Pulmonic Valve: The pulmonic valve was normal in structure. Pulmonic valve regurgitation is trivial. No evidence of pulmonic stenosis. Aorta: Aortic dilatation noted and the ascending aorta was not well visualized. There is moderate dilatation of the aortic root, measuring 45 mm. IAS/Shunts: No atrial level shunt detected by color flow Doppler.  LEFT VENTRICLE PLAX 2D LVIDd:         4.60 cm      Diastology  LVIDs:         3.50 cm      LV e' medial:    2.72 cm/s LV PW:         1.10 cm      LV E/e' medial:  15.0 LV IVS:        1.40 cm      LV e' lateral:   4.35 cm/s LVOT diam:     2.20 cm      LV E/e' lateral: 9.4 LV SV:         71 LV SV Index:   42 LVOT Area:     3.80 cm  LV Volumes (MOD) LV vol d, MOD A2C: 102.0 ml LV vol d, MOD A4C: 129.0 ml LV vol s, MOD A2C: 44.7 ml LV vol s, MOD A4C: 53.0 ml LV SV MOD A2C:     57.3 ml LV SV MOD A4C:     129.0 ml LV SV MOD BP:      68.2 ml RIGHT VENTRICLE RV S prime:     12.00 cm/s TAPSE (M-mode): 1.9 cm LEFT ATRIUM             Index        RIGHT ATRIUM           Index LA diam:        3.00 cm 1.78 cm/m   RA Area:     11.00 cm LA Vol (A2C):   57.4 ml 34.02 ml/m  RA Volume:   18.10 ml  10.73 ml/m LA Vol (A4C):   41.4 ml 24.53 ml/m LA Biplane Vol: 51.7 ml 30.64 ml/m  AORTIC VALVE                    PULMONIC VALVE  AV Area (Vmax):    3.36 cm     PV Vmax:          0.90 m/s AV Area (Vmean):   3.50 cm     PV Peak grad:     3.2 mmHg AV Area (VTI):     3.84 cm     PR End Diast Vel: 5.20 msec AV Vmax:           103.30 cm/s AV Vmean:          60.900 cm/s AV VTI:            0.185 m AV Peak Grad:      4.3 mmHg AV Mean Grad:      2.0 mmHg LVOT Vmax:         91.40 cm/s LVOT Vmean:        56.000 cm/s LVOT VTI:          0.187 m LVOT/AV VTI ratio: 1.01 AI PHT:            923 msec  AORTA Ao Root diam: 4.40 cm Ao Asc diam:  4.50 cm MITRAL VALVE               TRICUSPID VALVE MV Area (PHT): 1.83 cm    TR Peak grad:   18.3 mmHg MV Decel Time: 414 msec    TR Vmax:        214.00 cm/s MR Peak grad: 50.6 mmHg MR Vmax:      355.67 cm/s  SHUNTS MV E velocity: 40.70 cm/s  Systemic VTI:  0.19 m MV A velocity: 79.70 cm/s  Systemic Diam: 2.20 cm MV E/A ratio:  0.51 Rudean Haskell MD Electronically signed  by Rudean Haskell MD Signature Date/Time: 05/24/2022/12:49:00 PM    Final     PHYSICAL EXAM GENERAL: Drowsy, in no acute distress LUNGS: Normal respiratory effort. Non-labored breathing on room air CV: Regular rate and rhythm on telemetry   NEURO:  Mental Status:  Alert, oriented to self. States he is 88y/o and the year as "50". Does not know location or why he came to the hospital. Speech is soft, but clear and fluent.  Cranial Nerves:  II: Right pupil larger than left and off center (36m right 253mleft), this is his baseline per wife, unable to follow directions to test visual fields.  III, IV, VI: EOMI. Lid elevation symmetric and full.  V: Sensation is intact to light touch and symmetrical to face.  VII: Right sided facial droop VIII: Hearing intact to voice IX, X:Phonation normal.  XII: Tongue protrudes midline without fasciculations.   Motor: 4+/5 strength to LUE and LLE RUE 3/5, RLE 4-/5- seems to be partially effort dependent Tone is normal. Bulk is normal.  Sensation: Intact to light touch bilaterally in all  four extremities. No extinction to DSS present.  Coordination: Drift in right arm and leg. No gross ataxia noted, difficulty following directions  Gait: Deferred  ASSESSMENT/PLAN Mr. LaMELDON HANZLIKs a [age].o. male with history of  dementia, bradycardia vs irregular heartbeat, glaucoma, HTN, renal insufficiency, CAD and BPH presenting after multiple falls at home, right sided weakness, and hypertension. He started having falls on Saturday 9/9 and then EMS was called 9/10 and found him to be hypertensive. Imaging shows an acute left external capsule infarct. Previous CTA shows 70% stenosis of left carotid and less than 50% stenosis of right carotid.  Stroke:  Left external capsule infarct Etiology:  Small vessel disease   CT Head No acute intracranial abnormality. Chronic microvascular disease and brain atrophy. Remote right occipital lobe infarct. CTA head & neck Multifocal severe stenosis of the right PCA with critical stenosis versus short segment occlusion of the right PCA P2 segment. Approximately 50% stenosis of the proximal left internal carotid artery. MRI  Acute infarct of left external capsule and radiating white matter tracts, old occipital infarcts 2D Echo EF 5070-26%grade I diastolic dysfunction LDL [id number] 4.6 VTE prophylaxis - SCDs    Diet   Diet Heart Room service appropriate? Yes; Fluid consistency: Thin   No antithrombotic prior to admission, now on aspirin 81 mg daily and clopidogrel 75 mg daily.  Therapy recommendations:  Pending, CIR? Disposition:    Hypertension Home meds:  amlodipine, clonidine, metoprolol succinate Stable Maintain normotension  Hyperlipidemia Home meds:  None LDL 120, goal < 70 Add Crestor when able to swallow  Continue statin at discharge  Other Stroke Risk Factors Advanced Age >/= 6563Coronary artery disease  Other Active Problems Dementia  Home Meds: Depakote, Seroquel  Dysphagia Re-eval per SLP CXR Oral hygiene per protocol   Maintain aspiration precautions   Hospital day # 2  Patient seen and examined by NP/APP with MD. MD to update note as needed.   DeJanine OresDNP, FNP-BC [facility] Pager: [phone] ATTESTATION:  [age]ear old with left internal capsule stroke.  CTA showed improved stenosis in the left ICA 50% from previous.  He also has a right PCA critical stenosis.  He had a code stroke activation this morning while in the ICU with worsening mentation and weakness.  Stat head CT and CTA completed that was negative for hemorrhage or large vessel  occlusion. Reviewed CTA results with neuro IR.  On exam he is not following commands consistently.  He was encephalopathic and not oriented.  A right-sided weakness but also bilateral leg weakness.    Recommend EEG.  Complete stroke work-up PT OT will need DAPT therapy for 3 months given intracranial stenosis  ADDENDUM: Eeg shows generalized slowing.  Dr. Reeves Forth evaluated pt independently, reviewed imaging, chart, labs. Discussed and formulated plan with the Resident/APP. Changes were made to the note where appropriate. Please see APP/resident note above for details.      This patient is critically ill due to respiratory distress, stroke and another code stroke activation and at significant risk of neurological worsening, death form heart failure, respiratory failure, recurrent stroke, bleeding from [facility], seizure, sepsis. This patient's care requires constant monitoring of vital signs, hemodynamics, respiratory and cardiac monitoring, review of multiple databases, neurological assessment, discussion with family, other specialists and medical decision making of high complexity. I spent 60 minutes of neurocritical care time in the care of this patient.   Evalina Tabak,MD    To contact Stroke Continuity provider, please refer to [email or web address] After hours, contact General Neurology

## 2022-05-25 NOTE — Plan of Care (Cosign Needed)
Code Stroke called for neuro change Patient is slumped over to the right, unable to verbalize or follow commands upon Np entering room. Post imaging, patient is slightly more interactive. Will cancel transfer orders and re evaluate tomorrow.  NIHSS 17  NIHSS components Score: Comment  1a Level of Conscious 0'[x]'$  1'[]'$  2'[]'$  3'[]'$         1b LOC Questions 0'[x]'$  1'[]'$  2'[]'$           1c LOC Commands 0'[]'$  1'[]'$  2'[x]'$           2 Best Gaze 0'[x]'$  1'[]'$  2'[]'$           3 Visual 0'[x]'$  1'[]'$  2'[]'$  3'[]'$         4 Facial Palsy 0'[]'$  1'[x]'$  2'[]'$  3'[]'$         5a Motor Arm - left 0'[]'$  1'[]'$  2'[x]'$  3'[]'$  4'[]'$  UN'[]'$     5b Motor Arm - Right 0'[]'$  1'[]'$  2'[x]'$  3'[]'$  4'[]'$  UN'[]'$     6a Motor Leg - Left 0'[]'$  1'[]'$  2'[]'$  3'[x]'$  4'[]'$  UN'[]'$     6b Motor Leg - Right 0'[]'$  1'[]'$  2'[]'$  3'[x]'$  4'[]'$  UN'[]'$     7 Limb Ataxia 0'[x]'$  1'[]'$  2'[]'$  3'[]'$  UN'[]'$       8 Sensory 0'[x]'$  1'[]'$  2'[]'$  UN'[]'$         9 Best Language 0'[]'$  1'[x]'$  2'[]'$  3'[]'$         10 Dysarthria 0'[]'$  1'[x]'$  2'[]'$  UN'[]'$         11 Extinct. and Inattention 0'[x]'$  1'[]'$  2'[]'$           TOTAL: 17      CT head Acute infarct in the left external capsule and white matter tracks better characterized on recent MRI. No evidence of interval infarct by CT or acute hemorrhage.  CTA Head and Neck 1. Similar multifocal severe stenosis of the right PCA with critical stenosis versus occlusion of the right P2 PCA and distal opacification. 2. Similar moderate left M1 MCA stenosis. 3. Similar 50-60% stenosis of the proximal left ICA in the neck. 4. Partially imaged ground-glass opacities lateral aspect of the left upper lobe, potentially infectious/inflammatory. Recommend correlation with dedicated chest imaging.  Chest X-ray Cardiomegaly without acute abnormality of the lungs in AP portable projection.

## 2022-05-25 NOTE — Progress Notes (Signed)
PROGRESS NOTE    [name] [name]  WGN:[id number] DOB: 1932/07/06 DOA: 05/23/2022 PCP: [name], DO   Brief Narrative: Patient was admitted by [facility] 05/24/2019 [place] 05/24/2022 88 year old male lives at home with his wife with history of dementia, CKD stage IIIb, CAD, BPH admitted with generalized weakness.  Family reported multiple falls prior to admission.  There was a concern for more right-sided weakness than left-sided.  On admission his blood pressure was 232/111 and he was confused.  He was initially started on nicardipine infusion.  Thereafter he was on clonidine Norvasc and metoprolol.  However metoprolol had to be held due to bradycardia.  Assessment & Plan:   Principal Problem:   Hypertensive emergency Active Problems:   Hyperlipidemia LDL goal <100   Primary open-angle glaucoma, severe stage   Essential hypertension   Bradycardia   Stroke ([place])   Hypokalemia   Dementia without behavioral disturbance ([place])   Depression   #1 hypertensive emergency patient was treated with nicardipine drip.  Currently on Norvasc 10 mg daily, clonidine 0.2 mg twice a day, metoprolol was stopped due to bradycardia. Echo with EF 55 to 60%.  No regional wall motion abnormalities.  Severe asymmetric LVH.  Apical hypertrophic cardiomyopathy.  No evidence of thrombus.  Grade 1 diastolic dysfunction.  #2 bradycardia hold metoprolol.  Check echo.  Troponin flat.  #3  Left external capsule acute infarct -patient with right-sided weakness and right facial droop.  MRI yesterday with acute external capsule infarct.  Today patient had a code stroke with increased right-sided weakness slurred speech and decreased responsiveness.  Patient had trouble swallowing pills which she was swallowing before.  Stat CT of the head did confirm the MRI findings from yesterday.  Transfer to the floor was canceled.   EEG shows mild diffuse encephalopathy.  No seizures or epileptiform discharges noted.    CT on  admission did not reveal any acute findings other than old right occipital stroke and atrophy.  Start aspirin 81 mg daily. Continue aspirin and Plavix. Patient and kept n.p.o. for now seen by speech therapy. CT angiogram of the head and neck multifocal severe stenosis of the right PCA with critical stenosis versus short segment occlusion of the right PCA P2 segment.  Approximately 50% stenosis of the proximal left internal Carotid artery.  Multiple old infarcts and chronic ischemic microangiopathy. LDL 120 A1 C4.6  #4 hypokalemia resolved   #5 open angle glaucoma continue eyedrops  #6 history of dementia with recurrent falls on Depakote and Seroquel Seroquel stopped 05/25/2022  #7 depression on Zoloft  #8 hyperlipidemia LDL 120 Lipitor started   Estimated body mass index is 18.7 kg/m as calculated from the following:   Height as of 04/29/22: '5\' 8"'$  (1.727 m).   Weight as of this encounter: 55.8 kg.  DVT prophylaxis: Heparin  code Status: Full code  family Communication: Discussed with wife on the phone disposition Plan:  Status is: Inpatient Remains inpatient appropriate because: Hypertensive emergency   Consultants:  PCCM  Procedures: None Antimicrobials: None  Subjective:  Patient was awake when I saw him early this morning When I said hello he said hello very clearly After that patient had a code stroke with increased right-sided weakness and increased confusion Stat CT confirmed the MRI findings from yesterday.  Objective: Vitals:   05/25/22 1245 05/25/22 1300 05/25/22 1400 05/25/22 1500  BP:  (!) 156/76 (!) 174/84 (!) 148/76  Pulse: 61 60 78 (!) 57  Resp: 16 15 20  16  Temp:      TempSrc:      SpO2: 97% 98% 100% 97%  Weight:        Intake/Output Summary (Last 24 hours) at 05/25/2022 1556 Last data filed at 05/25/2022 0500 Gross per 24 hour  Intake 30 ml  Output 590 ml  Net -560 ml    Filed Weights   05/24/22 0500 05/25/22 0500  Weight: 57.8 kg 55.8 kg     Examination:  General exam: Appears in no acute distress Respiratory system: Clear to auscultation. Respiratory effort normal. Cardiovascular system: S1 & S2 heard, RRR. No JVD, murmurs, rubs, gallops or clicks. No pedal edema. Gastrointestinal system: Abdomen is nondistended, soft and nontender. No organomegaly or masses felt. Normal bowel sounds heard. Central nervous system: Responds to some commands Wiggles his left side toes does not move his right lower extremity.  Right facial droop extremities: No edema Skin: No rashes, lesions or ulcers Psychiatry: Unable to assess   Data Reviewed: I have personally reviewed following labs and imaging studies  CBC: Recent Labs  Lab 05/23/22 0230 05/23/22 0830 05/24/22 0517 05/25/22 0618  WBC 6.3 14.8* 7.6 9.2  NEUTROABS 4.2  --   --   --   HGB 12.2* 13.4 11.3* 13.5  HCT 36.6* 38.8* 34.0* 38.9*  MCV 92.7 91.3 93.2 90.7  PLT 211 195 163 643    Basic Metabolic Panel: Recent Labs  Lab 05/23/22 0230 05/23/22 0830 05/24/22 0517 05/25/22 0618  NA 140 139 137 138  K 3.6 3.3* 3.4* 4.3  CL 104 107 108 106  CO2 '23 22 22 22  '$ GLUCOSE 96 134* 82 79  BUN '16 14 13 20  '$ CREATININE 1.44* 1.20 1.22 1.31*  CALCIUM 8.8* 8.6* 8.5* 8.9  MG  --  1.9 2.0  --     GFR: Estimated Creatinine Clearance: 30.8 mL/min (A) (by C-G formula based on SCr of 1.31 mg/dL (H)). Liver Function Tests: Recent Labs  Lab 05/23/22 0230  AST 18  ALT 11  ALKPHOS 46  BILITOT 1.2  PROT 7.1  ALBUMIN 3.4*    No results for input(s): "LIPASE", "AMYLASE" in the last 168 hours. No results for input(s): "AMMONIA" in the last 168 hours. Coagulation Profile: No results for input(s): "INR", "PROTIME" in the last 168 hours. Cardiac Enzymes: No results for input(s): "CKTOTAL", "CKMB", "CKMBINDEX", "TROPONINI" in the last 168 hours. BNP (last 3 results) No results for input(s): "PROBNP" in the last 8760 hours. HbA1C: Recent Labs    05/24/22 0517  HGBA1C 4.6*     CBG: Recent Labs  Lab 05/23/22 0624 05/23/22 0751  GLUCAP 120* 135*    Lipid Profile: Recent Labs    05/25/22 0618  CHOL 174  HDL 41  LDLCALC 120*  TRIG 66  CHOLHDL 4.2    Thyroid Function Tests: No results for input(s): "TSH", "T4TOTAL", "FREET4", "T3FREE", "THYROIDAB" in the last 72 hours. Anemia Panel: No results for input(s): "VITAMINB12", "FOLATE", "FERRITIN", "TIBC", "IRON", "RETICCTPCT" in the last 72 hours. Sepsis Labs: No results for input(s): "PROCALCITON", "LATICACIDVEN" in the last 168 hours.  Recent Results (from the past 240 hour(s))  SARS Coronavirus 2 by RT PCR (hospital order, performed in [facility] hospital lab) *cepheid single result test* Anterior Nasal Swab     Status: None   Collection Time: 05/23/22  5:18 AM   Specimen: Anterior Nasal Swab  Result Value Ref Range Status   SARS Coronavirus 2 by RT PCR NEGATIVE NEGATIVE Final    Comment: (  NOTE) SARS-CoV-2 target nucleic acids are NOT DETECTED.  The SARS-CoV-2 RNA is generally detectable in upper and lower respiratory specimens during the acute phase of infection. The lowest concentration of SARS-CoV-2 viral copies this assay can detect is 250 copies / mL. A negative result does not preclude SARS-CoV-2 infection and should not be used as the sole basis for treatment or other patient management decisions.  A negative result may occur with improper specimen collection / handling, submission of specimen other than nasopharyngeal swab, presence of viral mutation(s) within the areas targeted by this assay, and inadequate number of viral copies (<250 copies / mL). A negative result must be combined with clinical observations, patient history, and epidemiological information.  Fact Sheet for Patients:   https://www.patel.info/  Fact Sheet for Healthcare Providers: https://hall.com/  This test is not yet approved or  cleared by the [place] FDA  and has been authorized for detection and/or diagnosis of SARS-CoV-2 by FDA under an Emergency Use Authorization (EUA).  This EUA will remain in effect (meaning this test can be used) for the duration of the COVID-19 declaration under Section 564(b)(1) of the Act, 21 U.S.C. section 360bbb-3(b)(1), unless the authorization is terminated or revoked sooner.  Performed at [facility], [place] [address], [place], [place] [address]   MRSA Next Gen by PCR, Nasal     Status: None   Collection Time: 05/23/22  9:20 AM   Specimen: Nasal Mucosa; Nasal Swab  Result Value Ref Range Status   MRSA by PCR Next Gen NOT DETECTED NOT DETECTED Final    Comment: (NOTE) The GeneXpert MRSA Assay (FDA approved for NASAL specimens only), is one component of a comprehensive MRSA colonization surveillance program. It is not intended to diagnose MRSA infection nor to guide or monitor treatment for MRSA infections. Test performance is not FDA approved in patients less than 41 years old. Performed at [facility], [place] [address] [place], [place] [address]          Radiology Studies: EEG adult  Result Date: Jun 18, 2022 Lora Havens, MD     June 18, 2022  1:25 PM Patient Name: [name] [name] MRN: [id number] Epilepsy Attending: Lora Havens Referring Physician/Provider: Janine Ores, NP Date: Jun 18, 2022 Duration: 24.14 mins Patient history: 88 year old patient with history of dementia, glaucoma, HTN, renal insufficiency, CAD and BPH presents with right sided weakness and multiple falls at home along with hypertensive emergency and confusion.  EEG to evaluate for seizure. Level of alertness: Awake AEDs during EEG study: None Technical aspects: This EEG study was done with scalp electrodes positioned according to the 10-20 International system of electrode placement. Electrical activity was reviewed with band pass filter of 1-'70Hz'$ , sensitivity of 7 uV/mm, display speed of 77m/sec with a '60Hz'$  notched  filter applied as appropriate. EEG data were recorded continuously and digitally stored.  Video monitoring was available and reviewed as appropriate. Description: The posterior dominant rhythm consists of 8 Hz activity of moderate voltage (25-35 uV) seen predominantly in posterior head regions, symmetric and reactive to eye opening and eye closing. EEG showed intermittent generalized 3 to 6 Hz theta-delta slowing. Hyperventilation and photic stimulation were not performed.   Parts of study were difficult to interpret due to significant myogenic and electrode artifact ABNORMALITY - Intermittent slow, generalized IMPRESSION: This technically difficult study is suggestive of mild diffuse encephalopathy, nonspecific etiology. No seizures or epileptiform discharges were seen throughout the recording. Priyanka OBarbra Sarks  DG CHEST PORT 1  VIEW  Result Date: 05/25/2022 CLINICAL DATA:  Respiratory abnormalities, query aspiration EXAM: PORTABLE CHEST 1 VIEW COMPARISON:  05/23/2022 FINDINGS: Cardiomegaly. Both lungs are clear. The visualized skeletal structures are unremarkable. IMPRESSION: Cardiomegaly without acute abnormality of the lungs in AP portable projection. Electronically Signed   By: Delanna Ahmadi M.D.   On: 05/25/2022 11:18   CT ANGIO HEAD NECK W WO CM (CODE STROKE)  Result Date: 05/25/2022 CLINICAL DATA:  Neuro deficit, acute, stroke suspected EXAM: CT ANGIOGRAPHY HEAD AND NECK TECHNIQUE: Multidetector CT imaging of the head and neck was performed using the standard protocol during bolus administration of intravenous contrast. Multiplanar CT image reconstructions and MIPs were obtained to evaluate the vascular anatomy. Carotid stenosis measurements (when applicable) are obtained utilizing NASCET criteria, using the distal internal carotid diameter as the denominator. RADIATION DOSE REDUCTION: This exam was performed according to the departmental dose-optimization program which includes automated exposure  control, adjustment of the mA and/or kV according to patient size and/or use of iterative reconstruction technique. CONTRAST:  101m OMNIPAQUE IOHEXOL 350 MG/ML SOLN COMPARISON:  Same day CTA FINDINGS: CTA NECK FINDINGS Aortic arch: Great vessel origins are patent without significant stenosis. Aortic atherosclerosis Right carotid system: Atherosclerosis at the carotid bifurcation without greater than 50% stenosis. Left carotid system: Atherosclerosis at the carotid bifurcation approximately 50-60% stenosis the proximal IC Vertebral arteries: Left dominant. No evidence of significant (greater than 50%) stenosis. Skeleton: Multilevel degenerative change. Other neck: No acute findings. Upper chest: Partially imaged ground-glass opacities lateral aspect of the left upper lobe. Review of the MIP images confirms the above findings CTA HEAD FINDINGS Anterior circulation: Intracranial ICAs are patent with mild narrowing due to calcific atherosclerosis. Bilateral MCAs and ACAs are patent. Similar moderate left M1 MCA stenosis. Posterior circulation: Bilateral intradural vertebral arteries and basilar artery are patent. Similar multifocal severe stenosis of the right PCA with critical stenosis versus occlusion of the right P2 PCA and distal opacification. Left PCA is patent Venous sinuses: As permitted by contrast timing, patent. Review of the MIP images confirms the above findings IMPRESSION: 1. Similar multifocal severe stenosis of the right PCA with critical stenosis versus occlusion of the right P2 PCA and distal opacification. 2. Similar moderate left M1 MCA stenosis. 3. Similar 50-60% stenosis of the proximal left ICA in the neck. 4. Partially imaged ground-glass opacities lateral aspect of the left upper lobe, potentially infectious/inflammatory. Recommend correlation with dedicated chest imaging. Electronically Signed   By: FMargaretha SheffieldM.D.   On: 05/25/2022 10:57   CT HEAD CODE STROKE WO CONTRAST  Result  Date: 05/25/2022 CLINICAL DATA:  Code stroke.  Neuro deficit, acute, stroke suspected EXAM: CT HEAD WITHOUT CONTRAST TECHNIQUE: Contiguous axial images were obtained from the base of the skull through the vertex without intravenous contrast. RADIATION DOSE REDUCTION: This exam was performed according to the departmental dose-optimization program which includes automated exposure control, adjustment of the mA and/or kV according to patient size and/or use of iterative reconstruction technique. COMPARISON:  CT head 05/23/22 and MRI head 05/24/22 FINDINGS: Brain: Known acute infarct in the left external capsule and white matter tracks better characterized on recent MRI. No evidence of interval infarct or acute hemorrhage. Remote right occipital infarct. No significant mass effect or midline shift no hydrocephalus. Chronic microvascular ischemic disease and cerebral atrophy Vascular: No hyperdense vessel. Skull: No acute fracture. Sinuses/Orbits: No acute findings. IMPRESSION: Known acute infarct in the left external capsule and white matter tracks better characterized on recent MRI. No evidence  of interval infarct by CT or acute hemorrhage. Code stroke imaging results were communicated on 05/25/2022 at 10:29 am to provider Dr. Reeves Forth Via secure text paging. Electronically Signed   By: Margaretha Sheffield M.D.   On: 05/25/2022 10:30   CT ANGIO HEAD NECK W WO CM  Result Date: 05/25/2022 CLINICAL DATA:  Stroke follow-up EXAM: CT ANGIOGRAPHY HEAD AND NECK TECHNIQUE: Multidetector CT imaging of the head and neck was performed using the standard protocol during bolus administration of intravenous contrast. Multiplanar CT image reconstructions and MIPs were obtained to evaluate the vascular anatomy. Carotid stenosis measurements (when applicable) are obtained utilizing NASCET criteria, using the distal internal carotid diameter as the denominator. RADIATION DOSE REDUCTION: This exam was performed according to the departmental  dose-optimization program which includes automated exposure control, adjustment of the mA and/or kV according to patient size and/or use of iterative reconstruction technique. CONTRAST:  61m OMNIPAQUE IOHEXOL 350 MG/ML SOLN COMPARISON:  None Available. FINDINGS: CT HEAD FINDINGS Brain: There is no mass, hemorrhage or extra-axial collection. The size and configuration of the ventricles and extra-axial CSF spaces are normal. There are multiple old infarcts, including a right occipital infarct. There is hypoattenuation of the periventricular white matter, most commonly indicating chronic ischemic microangiopathy. Skull: The visualized skull base, calvarium and extracranial soft tissues are normal. Sinuses/Orbits: No fluid levels or advanced mucosal thickening of the visualized paranasal sinuses. No mastoid or middle ear effusion. There are bilateral lens replacements. CTA NECK FINDINGS SKELETON: There is no bony spinal canal stenosis. No lytic or blastic lesion. OTHER NECK: Normal pharynx, larynx and major salivary glands. No cervical lymphadenopathy. Unremarkable thyroid gland. UPPER CHEST: No pneumothorax or pleural effusion. No nodules or masses. AORTIC ARCH: There is no calcific atherosclerosis of the aortic arch. There is no aneurysm, dissection or hemodynamically significant stenosis of the visualized portion of the aorta. Conventional 3 vessel aortic branching pattern. The visualized proximal subclavian arteries are widely patent. RIGHT CAROTID SYSTEM: No dissection, occlusion or aneurysm. Mild atherosclerotic calcification at the carotid bifurcation without hemodynamically significant stenosis. LEFT CAROTID SYSTEM: No dissection, occlusion or aneurysm. There is mixed density atherosclerosis extending into the proximal ICA, resulting in 50% stenosis. VERTEBRAL ARTERIES: Codominant configuration. Both origins are clearly patent. No focal abnormality. CTA HEAD FINDINGS POSTERIOR CIRCULATION: --Vertebral arteries:  Normal V4 segments. --Inferior cerebellar arteries: Normal. --Basilar artery: Normal. --Superior cerebellar arteries: Normal. --Posterior cerebral arteries (PCA): Multifocal severe stenosis of the right PCA. There is a critical stenosis versus short segment occlusion of the right P2 segment with maintained distal opacification. The left PCA is patent. ANTERIOR CIRCULATION: --Intracranial internal carotid arteries: Atherosclerotic calcification of the internal carotid arteries at the skull base without hemodynamically significant stenosis. --Anterior cerebral arteries (ACA): Normal. Both A1 segments are present. Patent anterior communicating artery (a-comm). --Middle cerebral arteries (MCA): Normal. VENOUS SINUSES: As permitted by contrast timing, patent. ANATOMIC VARIANTS: None Review of the MIP images confirms the above findings. IMPRESSION: 1. Multifocal severe stenosis of the right PCA with critical stenosis versus short segment occlusion of the right PCA P2 segment. Distal opacification is maintained. 2. Approximately 50% stenosis of the proximal left internal carotid artery. 3. Multiple old infarcts and chronic ischemic microangiopathy. Electronically Signed   By: KUlyses JarredM.D.   On: 05/25/2022 02:02   MR BRAIN WO CONTRAST  Result Date: 05/24/2022 CLINICAL DATA:  Stroke. Follow-up. Dementia. Renal disease. Generalized weakness with falling. EXAM: MRI HEAD WITHOUT CONTRAST TECHNIQUE: Multiplanar, multiecho pulse sequences of the brain and surrounding  structures were obtained without intravenous contrast. COMPARISON:  Head CT yesterday.  MRI 03/20/2022. FINDINGS: Brain: Diffusion imaging shows acute infarction in the left external capsule and radiating white matter tracts. No other acute insult. No evidence of swelling or hemorrhage. No focal abnormality affects the brainstem or cerebellum. Cerebral hemispheres show generalized atrophy with minimal chronic small-vessel ischemic changes within the white  matter and old bilateral occipital infarctions, right larger than left. No mass. No hydrocephalus or extra-axial collection. Vascular: Major vessels at the base of the brain show flow. Skull and upper cervical spine: Negative Sinuses/Orbits: Clear/normal Other: None IMPRESSION: Acute infarction of the left external capsule and radiating white matter tracts. No mass effect or hemorrhage. Old occipital infarctions right larger than left and minimal chronic small-vessel changes of the white matter as seen previously. Electronically Signed   By: Nelson Chimes M.D.   On: 05/24/2022 13:18   ECHOCARDIOGRAM COMPLETE  Result Date: 05/24/2022    ECHOCARDIOGRAM REPORT   Patient Name:   KAYLUB DETIENNE Date of Exam: 05/24/2022 Medical Rec #:  [id number]       Height:       68.0 in Accession #:    [id number]      Weight:       127.4 lb Date of Birth:  [birth year]        BSA:          1.687 m Patient Age:    45 years        BP:           164/84 mmHg Patient Gender: M               HR:           50 bpm. Exam Location:  Inpatient Procedure: 2D Echo and Intracardiac Opacification Agent Indications:    Abnormal ECG  History:        Patient has prior history of Echocardiogram examinations, most                 recent 11/26/2020. CAD, Arrythmias:Bradycardia; Risk                 Factors:Hypertension and Dyslipidemia.  Sonographer:    Harvie Junior Referring Phys: [id number] Lot Medford G Mehr Depaoli  Sonographer Comments: Technically difficult study due to poor echo windows. Image acquisition challenging due to respiratory motion. IMPRESSIONS  1. Left ventricular ejection fraction, by estimation, is 55 to 60%. The left ventricle has normal function. The left ventricle has no regional wall motion abnormalities. 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 Apical septal thickness 20 mm. No evidence of apical thrombus. No evidence of apical aneurysm. No LVOT obstruction. Left  ventricular diastolic parameters are consistent with Grade I diastolic dysfunction (impaired relaxation).  2. Right ventricular systolic function is normal. The right ventricular size is normal. There is normal pulmonary artery systolic pressure.  3. The mitral valve is grossly normal. No evidence of mitral valve regurgitation. No evidence of mitral stenosis.  4. The aortic valve is tricuspid. There is mild thickening of the aortic valve. Aortic valve regurgitation is trivial. No aortic stenosis is present.  5. Aortic dilatation noted. There is moderate dilatation of the aortic root, measuring 45 mm. Comparison(s): Prior images reviewed side by side. Increase in maximal LV thickness assessed. Increase in aortic root and Sinus of Valsalva dilation. This study is alos foreshortened. FINDINGS  Left Ventricle: Left ventricular ejection fraction, by estimation, is 55 to  60%. The left ventricle has normal function. The left ventricle has no regional wall motion abnormalities. Definity contrast agent was given IV to delineate the left ventricular  endocardial borders. The left ventricular internal cavity size was normal in size. There is severe asymmetric left ventricular hypertrophy. Left ventricular diastolic parameters are consistent with Grade I diastolic dysfunction (impaired relaxation). Right Ventricle: The right ventricular size is normal. No increase in right ventricular wall thickness. Right ventricular systolic function is normal. There is normal pulmonary artery systolic pressure. The tricuspid regurgitant velocity is 2.14 m/s, and  with an assumed right atrial pressure of 8 mmHg, the estimated right ventricular systolic pressure is 78.4 mmHg. Left Atrium: Left atrial size was normal in size. Right Atrium: Right atrial size was normal in size. Pericardium: Trivial pericardial effusion is present. Mitral Valve: The mitral valve is grossly normal. Mild mitral annular calcification. No evidence of mitral valve  regurgitation. No evidence of mitral valve stenosis. Tricuspid Valve: The tricuspid valve is normal in structure. Tricuspid valve regurgitation is not demonstrated. No evidence of tricuspid stenosis. Aortic Valve: The aortic valve is tricuspid. There is mild thickening of the aortic valve. Aortic valve regurgitation is trivial. Aortic regurgitation PHT measures 923 msec. No aortic stenosis is present. Aortic valve mean gradient measures 2.0 mmHg. Aortic valve peak gradient measures 4.3 mmHg. Aortic valve area, by VTI measures 3.84 cm. Pulmonic Valve: The pulmonic valve was normal in structure. Pulmonic valve regurgitation is trivial. No evidence of pulmonic stenosis. Aorta: Aortic dilatation noted and the ascending aorta was not well visualized. There is moderate dilatation of the aortic root, measuring 45 mm. IAS/Shunts: No atrial level shunt detected by color flow Doppler.  LEFT VENTRICLE PLAX 2D LVIDd:         4.60 cm      Diastology LVIDs:         3.50 cm      LV e' medial:    2.72 cm/s LV PW:         1.10 cm      LV E/e' medial:  15.0 LV IVS:        1.40 cm      LV e' lateral:   4.35 cm/s LVOT diam:     2.20 cm      LV E/e' lateral: 9.4 LV SV:         71 LV SV Index:   42 LVOT Area:     3.80 cm  LV Volumes (MOD) LV vol d, MOD A2C: 102.0 ml LV vol d, MOD A4C: 129.0 ml LV vol s, MOD A2C: 44.7 ml LV vol s, MOD A4C: 53.0 ml LV SV MOD A2C:     57.3 ml LV SV MOD A4C:     129.0 ml LV SV MOD BP:      68.2 ml RIGHT VENTRICLE RV S prime:     12.00 cm/s TAPSE (M-mode): 1.9 cm LEFT ATRIUM             Index        RIGHT ATRIUM           Index LA diam:        3.00 cm 1.78 cm/m   RA Area:     11.00 cm LA Vol (A2C):   57.4 ml 34.02 ml/m  RA Volume:   18.10 ml  10.73 ml/m LA Vol (A4C):   41.4 ml 24.53 ml/m LA Biplane Vol: 51.7 ml 30.64 ml/m  AORTIC VALVE  PULMONIC VALVE AV Area (Vmax):    3.36 cm     PV Vmax:          0.90 m/s AV Area (Vmean):   3.50 cm     PV Peak grad:     3.2 mmHg AV Area (VTI):      3.84 cm     PR End Diast Vel: 5.20 msec AV Vmax:           103.30 cm/s AV Vmean:          60.900 cm/s AV VTI:            0.185 m AV Peak Grad:      4.3 mmHg AV Mean Grad:      2.0 mmHg LVOT Vmax:         91.40 cm/s LVOT Vmean:        56.000 cm/s LVOT VTI:          0.187 m LVOT/AV VTI ratio: 1.01 AI PHT:            923 msec  AORTA Ao Root diam: 4.40 cm Ao Asc diam:  4.50 cm MITRAL VALVE               TRICUSPID VALVE MV Area (PHT): 1.83 cm    TR Peak grad:   18.3 mmHg MV Decel Time: 414 msec    TR Vmax:        214.00 cm/s MR Peak grad: 50.6 mmHg MR Vmax:      355.67 cm/s  SHUNTS MV E velocity: 40.70 cm/s  Systemic VTI:  0.19 m MV A velocity: 79.70 cm/s  Systemic Diam: 2.20 cm MV E/A ratio:  0.51 Rudean Haskell MD Electronically signed by Rudean Haskell MD Signature Date/Time: 05/24/2022/12:49:00 PM    Final         Scheduled Meds:  amLODipine  10 mg Oral Daily   aspirin EC  81 mg Oral Daily   atorvastatin  80 mg Oral Daily   brimonidine  1 drop Both Eyes BID   Chlorhexidine Gluconate Cloth  6 each Topical Daily   cloNIDine  0.2 mg Oral BID   [START ON 05/26/2022] clopidogrel  75 mg Oral Daily   divalproex  125 mg Oral BID   dorzolamide-timolol  1 drop Both Eyes BID   heparin  5,000 Units Subcutaneous Q8H   latanoprost  1 drop Both Eyes QHS   sertraline  50 mg Oral Daily   Continuous Infusions:  niCARDipine Stopped (05/23/22 1223)     LOS: 2 days    Time spent: 35 minutes    Georgette Shell, MD 05/25/2022, 3:56 PM

## 2022-05-25 NOTE — Procedures (Signed)
Patient Name: [name] [name]  MRN: [id number]  Epilepsy Attending: Lora Havens  Referring Physician/Provider: Janine Ores, NP  Date: 05/25/2022  Duration: 24.14 mins  Patient history: 88 year old patient with history of dementia, glaucoma, HTN, renal insufficiency, CAD and BPH presents with right sided weakness and multiple falls at home along with hypertensive emergency and confusion.  EEG to evaluate for seizure.  Level of alertness: Awake  AEDs during EEG study: None  Technical aspects: This EEG study was done with scalp electrodes positioned according to the 10-20 International system of electrode placement. Electrical activity was reviewed with band pass filter of 1-'70Hz'$ , sensitivity of 7 uV/mm, display speed of 35m/sec with a '60Hz'$  notched filter applied as appropriate. EEG data were recorded continuously and digitally stored.  Video monitoring was available and reviewed as appropriate.  Description: The posterior dominant rhythm consists of 8 Hz activity of moderate voltage (25-35 uV) seen predominantly in posterior head regions, symmetric and reactive to eye opening and eye closing. EEG showed intermittent generalized 3 to 6 Hz theta-delta slowing. Hyperventilation and photic stimulation were not performed.     Parts of study were difficult to interpret due to significant myogenic and electrode artifact  ABNORMALITY - Intermittent slow, generalized  IMPRESSION: This technically difficult study is suggestive of mild diffuse encephalopathy, nonspecific etiology. No seizures or epileptiform discharges were seen throughout the recording.  Shantrell Placzek OBarbra Sarks

## 2022-05-25 NOTE — Progress Notes (Signed)
EEG complete - results pending 

## 2022-05-25 NOTE — Progress Notes (Signed)
Pt not wearing CPAP at this time. Vitals are stable, pt is not in distress. RT will continue to monitor.

## 2022-05-25 NOTE — Progress Notes (Signed)
Physical Therapy Treatment Patient Details Name: [name] [name] MRN: [id number] DOB: 23-Apr-1932 Today's Date: 05/25/2022   History of Present Illness Patient is a 88 y/o male who presents on 9/10 due to falls and weakness. Admitted with encephalopathy secondary to HTN emergency. Brain MRI- acute infarct left external capsule and radiating white matter tracts.Marland Kitchen PMH includes HTN, bradycardia, CAD, cardiomyopathy, probable Alzheimer's.    PT Comments    Patient progressing slowly towards PT goals. Pt more alert during session and with speech intelligible at times. Requires Max A of 2 for bed mobility, standing and taking a few steps along side bed. Assist with RLE advancement, balance and right knee blocking. Pt noted to have worsening secretions and management of sections sitting EOB as well as worsened slurred speech. Had swallowed meds with nursing prior to session. RN notified of change in status. Will follow.   Recommendations for follow up therapy are one component of a multi-disciplinary discharge planning process, led by the attending physician.  Recommendations may be updated based on patient status, additional functional criteria and insurance authorization.  Follow Up Recommendations  Acute inpatient rehab (3hours/day)     Assistance Recommended at Discharge Frequent or constant Supervision/Assistance  Patient can return home with the following Two people to help with walking and/or transfers;A lot of help with bathing/dressing/bathroom;Help with stairs or ramp for entrance;Direct supervision/assist for financial management;Direct supervision/assist for medications management   Equipment Recommendations  Other (comment) (defer to next venue)    Recommendations for Other Services       Precautions / Restrictions Precautions Precautions: Fall Restrictions Weight Bearing Restrictions: No     Mobility  Bed Mobility Overal bed mobility: Needs Assistance Bed Mobility: Supine to  Sit, Sit to Supine     Supine to sit: Max assist, +2 for physical assistance, HOB elevated Sit to supine: Max assist, +2 for physical assistance, HOB elevated   General bed mobility comments: Assist of 2 to get to EOB with pt able to assist moving LLE, cues for sequencing.    Transfers Overall transfer level: Needs assistance Equipment used: 2 person hand held assist Transfers: Sit to/from Stand Sit to Stand: Max assist, +2 physical assistance           General transfer comment: Assist of 2 to power to standing with right lateral lean and right knee flexion.    Ambulation/Gait Ambulation/Gait assistance: Max assist, +2 physical assistance Gait Distance (Feet): 3 Feet Assistive device: 2 person hand held assist         General Gait Details: Able to side step along side bed with manual assist to move RLE and able to advance LLE with therapist stabilizing right knee due to buckling.   Stairs             Wheelchair Mobility    Modified Rankin (Stroke Patients Only) Modified Rankin (Stroke Patients Only) Pre-Morbid Rankin Score: Moderate disability Modified Rankin: Moderately severe disability     Balance Overall balance assessment: Needs assistance Sitting-balance support: Feet supported, No upper extremity supported Sitting balance-Leahy Scale: Poor Sitting balance - Comments: cannot withstand any challenge to sitting balance, Min A needed for support with moments of Min guard   Standing balance support: During functional activity Standing balance-Leahy Scale: Zero Standing balance comment: Requires assist of 2 with right lateral lean and blocking right knee during dynamic tasks.  Cognition Arousal/Alertness: Awake/alert Behavior During Therapy: Flat affect Overall Cognitive Status: Difficult to assess                                 General Comments: Pt more alert today. Follows some simple commands.  Mumbled speech but somewhat intelligible, however towards end of session, speech more slurred with increased secretions.        Exercises      General Comments General comments (skin integrity, edema, etc.): Nurse had just given pt medications prior to session. pt noted to have increased secretions with difficulty managing them during session with worsened slurred speech. RN notified.      Pertinent Vitals/Pain Pain Assessment Pain Assessment: Faces Faces Pain Scale: Hurts little more Pain Location: stomach Pain Descriptors / Indicators: Grimacing, Moaning Pain Intervention(s): Monitored during session, Repositioned    Home Living                          Prior Function            PT Goals (current goals can now be found in the care plan section) Progress towards PT goals: Progressing toward goals    Frequency    Min 4X/week      PT Plan Current plan remains appropriate    Co-evaluation              AM-PAC PT "6 Clicks" Mobility   Outcome Measure  Help needed turning from your back to your side while in a flat bed without using bedrails?: Total Help needed moving from lying on your back to sitting on the side of a flat bed without using bedrails?: Total Help needed moving to and from a bed to a chair (including a wheelchair)?: Total Help needed standing up from a chair using your arms (e.g., wheelchair or bedside chair)?: Total Help needed to walk in hospital room?: Total Help needed climbing 3-5 steps with a railing? : Total 6 Click Score: 6    End of Session   Activity Tolerance: Patient limited by fatigue Patient left: in bed;with call bell/phone within reach;with bed alarm set;Other (comment) (with mitts donned) Nurse Communication: Mobility status PT Visit Diagnosis: Hemiplegia and hemiparesis;Difficulty in walking, not elsewhere classified (R26.2);Muscle weakness (generalized) (M62.81) Hemiplegia - Right/Left: Right Hemiplegia -  dominant/non-dominant: Dominant Hemiplegia - caused by: Cerebral infarction     Time: 7824-2353 PT Time Calculation (min) (ACUTE ONLY): 25 min  Charges:  $Therapeutic Activity: 23-37 mins                     [name] [name], PT, DPT Acute Rehabilitation Services Secure chat preferred Office [phone]      [name] [name] 05/25/2022, 10:15 AM

## 2022-05-25 NOTE — Consult Note (Signed)
Palliative Care Consult Note                                  Date: 05/25/2022   Patient Name: [name] [name]  DOB: 12-02-1931  MRN: [id number]  Age / Sex: 88 y.o., male  PCP: [name], DO Referring Physician: Georgette Shell, MD  Reason for Consultation: Establishing goals of care  HPI/Patient Profile: 88 y.o. male  with past medical history of dementia, glaucoma, hypertension, CKD stage III, CAD, and BPH.  He presented to [facility] ED on 05/23/2022 with right-sided weakness and multiple falls at home.  On arrival he was found to be profoundly hypertensive 232/111 and confused.  Started on nicardipine infusion.  Admitted to PCCM with hypertensive emergency and encephalopathy.  Head CT showed no acute intracranial abnormality, chronic microvascular disease and brain atrophy, and remote right occipital lobe infarct.Follow-up MRI on 9/11 showed acute infarction of the left external capsule. On 9/12, code stroke was called due to increased weakness, slurred speech, and decreased responsiveness. Palliative medicine has been consulted for goals of care.  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  He is verbally responsive and able to tell me he is "doing good".  He is oriented to self only.  His speech is garbled at times.  He is initially alert but falls asleep soon into my visit.  I met at bedside with his wife [name] to discuss diagnosis, prognosis, GOC, EOL wishes, disposition, and options.  I introduced Palliative Medicine as specialized medical care for people living with serious illness. It focuses on providing relief from the symptoms and stress of a serious illness.   A brief life review was discussed.  Thanos is originally from [facility].  He served in [employer] as a young adult.  After service, works for the [employer] first in [place] and later relocating back to [place].  Alver and [name] have been married for 24 years.  He has 4 children from a previous marriage, 3 of whom are living.  Dearion and [name] live together in their home in [place].  Prior to admission, he walked independently but required some assistance with ADLs.  He has been losing his vision due to glaucoma.  [name] reports that his dementia has become progressively worse.  She reports that he naps all day, and becomes confused at night.  He has even eloped from the house on several occasions.  We reviewed that dementia is a progressive and noncurable illness that is underlying his acute medical  conditions.  We discussed patient's current clinical status and what it means in the larger context of his ongoing co-morbidities. Discussed the natural disease trajectory of a sudden, severe neurological injury such as stroke. Discussed that Gracen will likely not return to his previous baseline.    Discussed dysphagia and aspiration risk secondary to stroke. Discussed current recommendations per SLP and concern that NPO status will not sustain him  for very long. [name] states "if you can't eat, you can't live". She is very clear that Skylen would not want a feeding tube, even for short-term.  Created space and opportunity for [name] to explore thoughts and feelings regarding current medical situation. She tells me that Jazion was very vibrant person, and is now a "shell" of his former self.  She tells me that Kalif would not want to live in a debilitated state.  The difference between full scope medical intervention and comfort care was considered.  Discussed that comfort care  involves de-escalating and stopping full scope medical interventions, allowing a natural course to occur. Discussed that the goal is comfort and dignity rather than cure/prolonging life. [name] states she does not want Urban to suffer.   Discussed continuing current interventions for now with plan to follow-up on Thursday 9/14. If there is not significant improvement in the next few days, [name] indicates she will likely want to transition Devon to comfort care.     Review of Systems  Unable to perform ROS   Objective:   Primary Diagnoses: Present on Admission:  Hypertensive emergency  Hyperlipidemia LDL goal <100  Primary open-angle glaucoma, severe stage  Essential hypertension  Bradycardia   Physical Exam Vitals reviewed.  Constitutional:      General: He is not in acute distress.    Appearance: He is ill-appearing.     Comments: Thin with muscle wasting  Cardiovascular:     Rate and Rhythm: Normal rate.  Pulmonary:     Effort: Pulmonary effort is normal.  Neurological:     Motor: Weakness present.     Comments: Oriented to self only  Psychiatric:        Cognition and Memory: Cognition is impaired.     Vital Signs:  BP (!) 149/79   Pulse (!) 59   Temp 98.2 F (36.8 C) (Axillary)   Resp 20   Wt 55.8 kg   SpO2 98%   BMI 18.70 kg/m   Palliative Assessment/Data: PPS 20%     Assessment & Plan:   SUMMARY OF  RECOMMENDATIONS   CODE STATUS changed to DNR/DNI Continue current supportive interventions for now Wife does not want a feeding tube  Likely will transition to comfort care if there is not significant improvement in a few days  I will follow up with patient and family when I return to service on Thursday 9/14  Primary Decision Maker: Wife [name]  Symptom Management:  Per primary team  Prognosis:  Very guarded/poor  Discharge Planning:  To Be Determined    Thank you for allowing us to participate in the care of Seneca E Wilton  MDM - High   Signed by:  , NP Palliative Medicine Team  Team Phone # [phone]  For individual providers, please see AMION                   

## 2022-05-25 NOTE — Progress Notes (Signed)
Inpatient Rehab Admissions Coordinator:   Code stroke this AM.  WIll f/u at bedside tomorrow.   Shann Medal, PT, DPT Admissions Coordinator [phone] 05/25/22  2:09 PM

## 2022-05-25 NOTE — Code Documentation (Signed)
Stroke Response Nurse Documentation Code Documentation  [name] [name] is a 88 y.o. male admitted to [facility]  on 05/23/22 for falls, weakness, and a subacute stroke (right-sided weakness) with past medical hx of CAD, HTN, HLD, CVA, renal insufficiency, . On aspirin 81 mg daily and clopidogrel 75 mg daily. Code stroke was activated by SRN per NP request .   Patient on 41M ICU unit where he was LKW at 0930 and now complaining of increased weakness, slurred speech, decreased responsiveness. Pt was having trouble swallowing pills with RN this am. Then after working with therapies he became less responsive with increased deficits and trouble following commands (was able to do prior). Code stroke called.   Stroke team at the bedside after patient activation. Patient to CT with team. NIHSS 14, see documentation for details and code stroke times. Patient with decreased LOC, disoriented, left gaze preference , right hemianopia, right facial droop, right leg weakness, right decreased sensation, Expressive aphasia , and dysarthria  on exam. The following imaging was completed:  CT Head and CTA. Patient is not a candidate for IV Thrombolytic due to recent subacute stroke. Patient is not a candidate for IR due to no LVO on CTA per MD.   Care/Plan: Q2x12 Neuro checks, BP<160 (clevidipine ordered if needed), EEG.   Bedside handoff with RN Vicente Males.    Ollie Esty, Rande Brunt  Stroke Response RN

## 2022-05-25 NOTE — Progress Notes (Deleted)
PROGRESS NOTE    [name] [name]  YNW:[id number] DOB: May 12, 1932 DOA: 05/23/2022 PCP: [name], DO   Brief Narrative: Patient was admitted by [facility] 05/24/2019 [name] [name] 05/24/2022 88 year old male lives at home with his wife with history of dementia, CKD stage IIIb, CAD, BPH admitted with generalized weakness.  Family reported multiple falls prior to admission.  There was a concern for more right-sided weakness than left-sided.  On admission his blood pressure was 232/111 and he was confused.  He was initially started on nicardipine infusion.  Thereafter he was on clonidine Norvasc and metoprolol.  However metoprolol had to be held due to bradycardia.  Assessment & Plan:   Principal Problem:   Hypertensive emergency   #1 hypertensive emergency patient was treated with nicardipine drip.  Currently on Norvasc 10 mg daily, clonidine 0.2 mg twice a day, metoprolol was stopped due to bradycardia. Echo with EF 55 to 60%.  No regional wall motion abnormalities.  Severe asymmetric LVH.  Apical hypertrophic cardiomyopathy.  No evidence of thrombus.  Grade 1 diastolic dysfunction.  #2 bradycardia hold metoprolol.  Check echo.  Troponin flat.  #3  Left external capsule acute infarct -patient with right-sided weakness and right facial droop.  MRI yesterday with acute external capsule infarct.  Today patient had a code stroke with increased right-sided weakness slurred speech and decreased responsiveness.  Patient had trouble swallowing pills which she was swallowing before.  Stat CT of the head did confirm the MRI findings from yesterday.  Transfer to the floor was canceled.   EEG shows mild diffuse encephalopathy.  No seizures or epileptiform discharges noted.    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  Start aspirin 81 mg daily. Continue aspirin and Plavix. Patient and kept n.p.o. for now seen by speech therapy. CT angiogram of  the head and neck multifocal severe stenosis of the right PCA with critical stenosis versus short segment occlusion of the right PCA P2 segment.  Approximately 50% stenosis of the proximal left internal Carotid artery.  Multiple old infarcts and chronic ischemic microangiopathy. LDL 120 A1 C4.6  #4 hypokalemia resolved   #5 open angle glaucoma continue eyedrops  #6 history of dementia with recurrent falls on Depakote and Seroquel Seroquel stopped 05/25/2022  #7 depression on Zoloft  #8 hyperlipidemia LDL 120 Lipitor started   Estimated body mass index is 18.7 kg/m as calculated from the following:   Height as of 04/29/22: '5\' 8"'$  (1.727 m).   Weight as of this encounter: 55.8 kg.  DVT prophylaxis: Heparin  code Status: Full code  family Communication: Discussed with wife on the phone disposition Plan:  Status is: Inpatient Remains inpatient appropriate because: Hypertensive emergency   Consultants:  PCCM  Procedures: None Antimicrobials: None  Subjective:  Patient was awake when I saw him early this morning When I said hello he said hello very clearly After that patient had a code stroke with increased right-sided weakness and increased confusion Stat CT confirmed the MRI findings from yesterday.  Objective: Vitals:   05/25/22 1230 05/25/22 1245 05/25/22 1300 05/25/22 1400  BP:   (!) 156/76 (!) 174/84  Pulse: 62 61 60 78  Resp: '16 16 15 20  '$ Temp:      TempSrc:      SpO2: 98% 97% 98% 100%  Weight:        Intake/Output Summary (Last 24 hours) at 05/25/2022 1500 Last data filed at 05/25/2022  0500 Gross per 24 hour  Intake 30 ml  Output 590 ml  Net -560 ml    Filed Weights   05/24/22 0500 05/25/22 0500  Weight: 57.8 kg 55.8 kg    Examination:  General exam: Appears in no acute distress Respiratory system: Clear to auscultation. Respiratory effort normal. Cardiovascular system: S1 & S2 heard, RRR. No JVD, murmurs, rubs, gallops or clicks. No pedal  edema. Gastrointestinal system: Abdomen is nondistended, soft and nontender. No organomegaly or masses felt. Normal bowel sounds heard. Central nervous system: Responds to some commands Wiggles his left side toes does not move his right lower extremity.  Right facial droop extremities: No edema Skin: No rashes, lesions or ulcers Psychiatry: Unable to assess   Data Reviewed: I have personally reviewed following labs and imaging studies  CBC: Recent Labs  Lab 05/23/22 0230 05/23/22 0830 05/24/22 0517 05/25/22 0618  WBC 6.3 14.8* 7.6 9.2  NEUTROABS 4.2  --   --   --   HGB 12.2* 13.4 11.3* 13.5  HCT 36.6* 38.8* 34.0* 38.9*  MCV 92.7 91.3 93.2 90.7  PLT 211 195 163 341    Basic Metabolic Panel: Recent Labs  Lab 05/23/22 0230 05/23/22 0830 05/24/22 0517 05/25/22 0618  NA 140 139 137 138  K 3.6 3.3* 3.4* 4.3  CL 104 107 108 106  CO2 '23 22 22 22  '$ GLUCOSE 96 134* 82 79  BUN '16 14 13 20  '$ CREATININE 1.44* 1.20 1.22 1.31*  CALCIUM 8.8* 8.6* 8.5* 8.9  MG  --  1.9 2.0  --     GFR: Estimated Creatinine Clearance: 30.8 mL/min (A) (by C-G formula based on SCr of 1.31 mg/dL (H)). Liver Function Tests: Recent Labs  Lab 05/23/22 0230  AST 18  ALT 11  ALKPHOS 46  BILITOT 1.2  PROT 7.1  ALBUMIN 3.4*    No results for input(s): "LIPASE", "AMYLASE" in the last 168 hours. No results for input(s): "AMMONIA" in the last 168 hours. Coagulation Profile: No results for input(s): "INR", "PROTIME" in the last 168 hours. Cardiac Enzymes: No results for input(s): "CKTOTAL", "CKMB", "CKMBINDEX", "TROPONINI" in the last 168 hours. BNP (last 3 results) No results for input(s): "PROBNP" in the last 8760 hours. HbA1C: Recent Labs    05/24/22 0517  HGBA1C 4.6*   CBG: Recent Labs  Lab 05/23/22 0624 05/23/22 0751  GLUCAP 120* 135*    Lipid Profile: Recent Labs    05/25/22 0618  CHOL 174  HDL 41  LDLCALC 120*  TRIG 66  CHOLHDL 4.2   Thyroid Function Tests: No results  for input(s): "TSH", "T4TOTAL", "FREET4", "T3FREE", "THYROIDAB" in the last 72 hours. Anemia Panel: No results for input(s): "VITAMINB12", "FOLATE", "FERRITIN", "TIBC", "IRON", "RETICCTPCT" in the last 72 hours. Sepsis Labs: No results for input(s): "PROCALCITON", "LATICACIDVEN" in the last 168 hours.  Recent Results (from the past 240 hour(s))  SARS Coronavirus 2 by RT PCR (hospital order, performed in [facility] hospital lab) *cepheid single result test* Anterior Nasal Swab     Status: None   Collection Time: 05/23/22  5:18 AM   Specimen: Anterior Nasal Swab  Result Value Ref Range Status   SARS Coronavirus 2 by RT PCR NEGATIVE NEGATIVE Final    Comment: (NOTE) SARS-CoV-2 target nucleic acids are NOT DETECTED.  The SARS-CoV-2 RNA is generally detectable in upper and lower respiratory specimens during the acute phase of infection. The lowest concentration of SARS-CoV-2 viral copies this assay can detect is 250 copies / mL.  A negative result does not preclude SARS-CoV-2 infection and should not be used as the sole basis for treatment or other patient management decisions.  A negative result may occur with improper specimen collection / handling, submission of specimen other than nasopharyngeal swab, presence of viral mutation(s) within the areas targeted by this assay, and inadequate number of viral copies (<250 copies / mL). A negative result must be combined with clinical observations, patient history, and epidemiological information.  Fact Sheet for Patients:   https://www.patel.info/  Fact Sheet for Healthcare Providers: https://hall.com/  This test is not yet approved or  cleared by the [place] FDA and has been authorized for detection and/or diagnosis of SARS-CoV-2 by FDA under an Emergency Use Authorization (EUA).  This EUA will remain in effect (meaning this test can be used) for the duration of the COVID-19 declaration  under Section 564(b)(1) of the Act, 21 U.S.C. section 360bbb-3(b)(1), unless the authorization is terminated or revoked sooner.  Performed at [facility], [place] [address], [place], [place] [address]   MRSA Next Gen by PCR, Nasal     Status: None   Collection Time: 05/23/22  9:20 AM   Specimen: Nasal Mucosa; Nasal Swab  Result Value Ref Range Status   MRSA by PCR Next Gen NOT DETECTED NOT DETECTED Final    Comment: (NOTE) The GeneXpert MRSA Assay (FDA approved for NASAL specimens only), is one component of a comprehensive MRSA colonization surveillance program. It is not intended to diagnose MRSA infection nor to guide or monitor treatment for MRSA infections. Test performance is not FDA approved in patients less than 60 years old. Performed at [facility], [place] [address], [place], [place] [address]          Radiology Studies: EEG adult  Result Date: 06-17-22 Lora Havens, MD     Jun 17, 2022  1:25 PM Patient Name: [name] [name] MRN: [id number] Epilepsy Attending: Lora Havens Referring Physician/Provider: Janine Ores, NP Date: 06-17-2022 Duration: 24.14 mins Patient history: 88 year old patient with history of dementia, glaucoma, HTN, renal insufficiency, CAD and BPH presents with right sided weakness and multiple falls at home along with hypertensive emergency and confusion.  EEG to evaluate for seizure. Level of alertness: Awake AEDs during EEG study: None Technical aspects: This EEG study was done with scalp electrodes positioned according to the 10-20 International system of electrode placement. Electrical activity was reviewed with band pass filter of 1-'70Hz'$ , sensitivity of 7 uV/mm, display speed of 80m/sec with a '60Hz'$  notched filter applied as appropriate. EEG data were recorded continuously and digitally stored.  Video monitoring was available and reviewed as appropriate. Description: The posterior dominant rhythm consists of 8 Hz activity of moderate  voltage (25-35 uV) seen predominantly in posterior head regions, symmetric and reactive to eye opening and eye closing. EEG showed intermittent generalized 3 to 6 Hz theta-delta slowing. Hyperventilation and photic stimulation were not performed.   Parts of study were difficult to interpret due to significant myogenic and electrode artifact ABNORMALITY - Intermittent slow, generalized IMPRESSION: This technically difficult study is suggestive of mild diffuse encephalopathy, nonspecific etiology. No seizures or epileptiform discharges were seen throughout the recording. PLora Havens  DG CHEST PORT 1 VIEW  Result Date: 905-Oct-2023CLINICAL DATA:  Respiratory abnormalities, query aspiration EXAM: PORTABLE CHEST 1 VIEW COMPARISON:  05/23/2022 FINDINGS: Cardiomegaly. Both lungs are clear. The visualized skeletal structures are unremarkable. IMPRESSION: Cardiomegaly without acute abnormality of the lungs in AP portable  projection. Electronically Signed   By: Delanna Ahmadi M.D.   On: 05/25/2022 11:18   CT ANGIO HEAD NECK W WO CM (CODE STROKE)  Result Date: 05/25/2022 CLINICAL DATA:  Neuro deficit, acute, stroke suspected EXAM: CT ANGIOGRAPHY HEAD AND NECK TECHNIQUE: Multidetector CT imaging of the head and neck was performed using the standard protocol during bolus administration of intravenous contrast. Multiplanar CT image reconstructions and MIPs were obtained to evaluate the vascular anatomy. Carotid stenosis measurements (when applicable) are obtained utilizing NASCET criteria, using the distal internal carotid diameter as the denominator. RADIATION DOSE REDUCTION: This exam was performed according to the departmental dose-optimization program which includes automated exposure control, adjustment of the mA and/or kV according to patient size and/or use of iterative reconstruction technique. CONTRAST:  58m OMNIPAQUE IOHEXOL 350 MG/ML SOLN COMPARISON:  Same day CTA FINDINGS: CTA NECK FINDINGS Aortic arch:  Great vessel origins are patent without significant stenosis. Aortic atherosclerosis Right carotid system: Atherosclerosis at the carotid bifurcation without greater than 50% stenosis. Left carotid system: Atherosclerosis at the carotid bifurcation approximately 50-60% stenosis the proximal IC Vertebral arteries: Left dominant. No evidence of significant (greater than 50%) stenosis. Skeleton: Multilevel degenerative change. Other neck: No acute findings. Upper chest: Partially imaged ground-glass opacities lateral aspect of the left upper lobe. Review of the MIP images confirms the above findings CTA HEAD FINDINGS Anterior circulation: Intracranial ICAs are patent with mild narrowing due to calcific atherosclerosis. Bilateral MCAs and ACAs are patent. Similar moderate left M1 MCA stenosis. Posterior circulation: Bilateral intradural vertebral arteries and basilar artery are patent. Similar multifocal severe stenosis of the right PCA with critical stenosis versus occlusion of the right P2 PCA and distal opacification. Left PCA is patent Venous sinuses: As permitted by contrast timing, patent. Review of the MIP images confirms the above findings IMPRESSION: 1. Similar multifocal severe stenosis of the right PCA with critical stenosis versus occlusion of the right P2 PCA and distal opacification. 2. Similar moderate left M1 MCA stenosis. 3. Similar 50-60% stenosis of the proximal left ICA in the neck. 4. Partially imaged ground-glass opacities lateral aspect of the left upper lobe, potentially infectious/inflammatory. Recommend correlation with dedicated chest imaging. Electronically Signed   By: FMargaretha SheffieldM.D.   On: 05/25/2022 10:57   CT HEAD CODE STROKE WO CONTRAST  Result Date: 05/25/2022 CLINICAL DATA:  Code stroke.  Neuro deficit, acute, stroke suspected EXAM: CT HEAD WITHOUT CONTRAST TECHNIQUE: Contiguous axial images were obtained from the base of the skull through the vertex without intravenous  contrast. RADIATION DOSE REDUCTION: This exam was performed according to the departmental dose-optimization program which includes automated exposure control, adjustment of the mA and/or kV according to patient size and/or use of iterative reconstruction technique. COMPARISON:  CT head 05/23/22 and MRI head 05/24/22 FINDINGS: Brain: Known acute infarct in the left external capsule and white matter tracks better characterized on recent MRI. No evidence of interval infarct or acute hemorrhage. Remote right occipital infarct. No significant mass effect or midline shift no hydrocephalus. Chronic microvascular ischemic disease and cerebral atrophy Vascular: No hyperdense vessel. Skull: No acute fracture. Sinuses/Orbits: No acute findings. IMPRESSION: Known acute infarct in the left external capsule and white matter tracks better characterized on recent MRI. No evidence of interval infarct by CT or acute hemorrhage. Code stroke imaging results were communicated on 05/25/2022 at 10:29 am to provider Dr. PReeves ForthVia secure text paging. Electronically Signed   By: FMargaretha SheffieldM.D.   On: 05/25/2022 10:30  CT ANGIO HEAD NECK W WO CM  Result Date: 05/25/2022 CLINICAL DATA:  Stroke follow-up EXAM: CT ANGIOGRAPHY HEAD AND NECK TECHNIQUE: Multidetector CT imaging of the head and neck was performed using the standard protocol during bolus administration of intravenous contrast. Multiplanar CT image reconstructions and MIPs were obtained to evaluate the vascular anatomy. Carotid stenosis measurements (when applicable) are obtained utilizing NASCET criteria, using the distal internal carotid diameter as the denominator. RADIATION DOSE REDUCTION: This exam was performed according to the departmental dose-optimization program which includes automated exposure control, adjustment of the mA and/or kV according to patient size and/or use of iterative reconstruction technique. CONTRAST:  41m OMNIPAQUE IOHEXOL 350 MG/ML SOLN  COMPARISON:  None Available. FINDINGS: CT HEAD FINDINGS Brain: There is no mass, hemorrhage or extra-axial collection. The size and configuration of the ventricles and extra-axial CSF spaces are normal. There are multiple old infarcts, including a right occipital infarct. There is hypoattenuation of the periventricular white matter, most commonly indicating chronic ischemic microangiopathy. Skull: The visualized skull base, calvarium and extracranial soft tissues are normal. Sinuses/Orbits: No fluid levels or advanced mucosal thickening of the visualized paranasal sinuses. No mastoid or middle ear effusion. There are bilateral lens replacements. CTA NECK FINDINGS SKELETON: There is no bony spinal canal stenosis. No lytic or blastic lesion. OTHER NECK: Normal pharynx, larynx and major salivary glands. No cervical lymphadenopathy. Unremarkable thyroid gland. UPPER CHEST: No pneumothorax or pleural effusion. No nodules or masses. AORTIC ARCH: There is no calcific atherosclerosis of the aortic arch. There is no aneurysm, dissection or hemodynamically significant stenosis of the visualized portion of the aorta. Conventional 3 vessel aortic branching pattern. The visualized proximal subclavian arteries are widely patent. RIGHT CAROTID SYSTEM: No dissection, occlusion or aneurysm. Mild atherosclerotic calcification at the carotid bifurcation without hemodynamically significant stenosis. LEFT CAROTID SYSTEM: No dissection, occlusion or aneurysm. There is mixed density atherosclerosis extending into the proximal ICA, resulting in 50% stenosis. VERTEBRAL ARTERIES: Codominant configuration. Both origins are clearly patent. No focal abnormality. CTA HEAD FINDINGS POSTERIOR CIRCULATION: --Vertebral arteries: Normal V4 segments. --Inferior cerebellar arteries: Normal. --Basilar artery: Normal. --Superior cerebellar arteries: Normal. --Posterior cerebral arteries (PCA): Multifocal severe stenosis of the right PCA. There is a  critical stenosis versus short segment occlusion of the right P2 segment with maintained distal opacification. The left PCA is patent. ANTERIOR CIRCULATION: --Intracranial internal carotid arteries: Atherosclerotic calcification of the internal carotid arteries at the skull base without hemodynamically significant stenosis. --Anterior cerebral arteries (ACA): Normal. Both A1 segments are present. Patent anterior communicating artery (a-comm). --Middle cerebral arteries (MCA): Normal. VENOUS SINUSES: As permitted by contrast timing, patent. ANATOMIC VARIANTS: None Review of the MIP images confirms the above findings. IMPRESSION: 1. Multifocal severe stenosis of the right PCA with critical stenosis versus short segment occlusion of the right PCA P2 segment. Distal opacification is maintained. 2. Approximately 50% stenosis of the proximal left internal carotid artery. 3. Multiple old infarcts and chronic ischemic microangiopathy. Electronically Signed   By: KUlyses JarredM.D.   On: 05/25/2022 02:02   MR BRAIN WO CONTRAST  Result Date: 05/24/2022 CLINICAL DATA:  Stroke. Follow-up. Dementia. Renal disease. Generalized weakness with falling. EXAM: MRI HEAD WITHOUT CONTRAST TECHNIQUE: Multiplanar, multiecho pulse sequences of the brain and surrounding structures were obtained without intravenous contrast. COMPARISON:  Head CT yesterday.  MRI 03/20/2022. FINDINGS: Brain: Diffusion imaging shows acute infarction in the left external capsule and radiating white matter tracts. No other acute insult. No evidence of swelling or hemorrhage. No focal  abnormality affects the brainstem or cerebellum. Cerebral hemispheres show generalized atrophy with minimal chronic small-vessel ischemic changes within the white matter and old bilateral occipital infarctions, right larger than left. No mass. No hydrocephalus or extra-axial collection. Vascular: Major vessels at the base of the brain show flow. Skull and upper cervical spine:  Negative Sinuses/Orbits: Clear/normal Other: None IMPRESSION: Acute infarction of the left external capsule and radiating white matter tracts. No mass effect or hemorrhage. Old occipital infarctions right larger than left and minimal chronic small-vessel changes of the white matter as seen previously. Electronically Signed   By: Nelson Chimes M.D.   On: 05/24/2022 13:18   ECHOCARDIOGRAM COMPLETE  Result Date: 05/24/2022    ECHOCARDIOGRAM REPORT   Patient Name:   [name] [name] Date of Exam: 05/24/2022 Medical Rec #:  [id number]       Height:       68.0 in Accession #:    [id number]      Weight:       127.4 lb Date of Birth:  [birth year]        BSA:          1.687 m Patient Age:    41 years        BP:           164/84 mmHg Patient Gender: M               HR:           50 bpm. Exam Location:  Inpatient Procedure: 2D Echo and Intracardiac Opacification Agent Indications:    Abnormal ECG  History:        Patient has prior history of Echocardiogram examinations, most                 recent 11/26/2020. CAD, Arrythmias:Bradycardia; Risk                 Factors:Hypertension and Dyslipidemia.  Sonographer:    Harvie Junior Referring Phys: [id number] Fujiko Picazo G Lendell Gallick  Sonographer Comments: Technically difficult study due to poor echo windows. Image acquisition challenging due to respiratory motion. IMPRESSIONS  1. Left ventricular ejection fraction, by estimation, is 55 to 60%. The left ventricle has normal function. The left ventricle has no regional wall motion abnormalities. 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 Apical septal thickness 20 mm. No evidence of apical thrombus. No evidence of apical aneurysm. No LVOT obstruction. Left ventricular diastolic parameters are consistent with Grade I diastolic dysfunction (impaired relaxation).  2. Right ventricular systolic function is normal. The right ventricular size is normal. There is normal  pulmonary artery systolic pressure.  3. The mitral valve is grossly normal. No evidence of mitral valve regurgitation. No evidence of mitral stenosis.  4. The aortic valve is tricuspid. There is mild thickening of the aortic valve. Aortic valve regurgitation is trivial. No aortic stenosis is present.  5. Aortic dilatation noted. There is moderate dilatation of the aortic root, measuring 45 mm. Comparison(s): Prior images reviewed side by side. Increase in maximal LV thickness assessed. Increase in aortic root and Sinus of Valsalva dilation. This study is alos foreshortened. FINDINGS  Left Ventricle: Left ventricular ejection fraction, by estimation, is 55 to 60%. The left ventricle has normal function. The left ventricle has no regional wall motion abnormalities. Definity contrast agent was given IV to delineate the left ventricular  endocardial borders. The left ventricular internal cavity size was normal in size. There is severe  asymmetric left ventricular hypertrophy. Left ventricular diastolic parameters are consistent with Grade I diastolic dysfunction (impaired relaxation). Right Ventricle: The right ventricular size is normal. No increase in right ventricular wall thickness. Right ventricular systolic function is normal. There is normal pulmonary artery systolic pressure. The tricuspid regurgitant velocity is 2.14 m/s, and  with an assumed right atrial pressure of 8 mmHg, the estimated right ventricular systolic pressure is 53.9 mmHg. Left Atrium: Left atrial size was normal in size. Right Atrium: Right atrial size was normal in size. Pericardium: Trivial pericardial effusion is present. Mitral Valve: The mitral valve is grossly normal. Mild mitral annular calcification. No evidence of mitral valve regurgitation. No evidence of mitral valve stenosis. Tricuspid Valve: The tricuspid valve is normal in structure. Tricuspid valve regurgitation is not demonstrated. No evidence of tricuspid stenosis. Aortic Valve:  The aortic valve is tricuspid. There is mild thickening of the aortic valve. Aortic valve regurgitation is trivial. Aortic regurgitation PHT measures 923 msec. No aortic stenosis is present. Aortic valve mean gradient measures 2.0 mmHg. Aortic valve peak gradient measures 4.3 mmHg. Aortic valve area, by VTI measures 3.84 cm. Pulmonic Valve: The pulmonic valve was normal in structure. Pulmonic valve regurgitation is trivial. No evidence of pulmonic stenosis. Aorta: Aortic dilatation noted and the ascending aorta was not well visualized. There is moderate dilatation of the aortic root, measuring 45 mm. IAS/Shunts: No atrial level shunt detected by color flow Doppler.  LEFT VENTRICLE PLAX 2D LVIDd:         4.60 cm      Diastology LVIDs:         3.50 cm      LV e' medial:    2.72 cm/s LV PW:         1.10 cm      LV E/e' medial:  15.0 LV IVS:        1.40 cm      LV e' lateral:   4.35 cm/s LVOT diam:     2.20 cm      LV E/e' lateral: 9.4 LV SV:         71 LV SV Index:   42 LVOT Area:     3.80 cm  LV Volumes (MOD) LV vol d, MOD A2C: 102.0 ml LV vol d, MOD A4C: 129.0 ml LV vol s, MOD A2C: 44.7 ml LV vol s, MOD A4C: 53.0 ml LV SV MOD A2C:     57.3 ml LV SV MOD A4C:     129.0 ml LV SV MOD BP:      68.2 ml RIGHT VENTRICLE RV S prime:     12.00 cm/s TAPSE (M-mode): 1.9 cm LEFT ATRIUM             Index        RIGHT ATRIUM           Index LA diam:        3.00 cm 1.78 cm/m   RA Area:     11.00 cm LA Vol (A2C):   57.4 ml 34.02 ml/m  RA Volume:   18.10 ml  10.73 ml/m LA Vol (A4C):   41.4 ml 24.53 ml/m LA Biplane Vol: 51.7 ml 30.64 ml/m  AORTIC VALVE                    PULMONIC VALVE AV Area (Vmax):    3.36 cm     PV Vmax:          0.90 m/s AV  Area (Vmean):   3.50 cm     PV Peak grad:     3.2 mmHg AV Area (VTI):     3.84 cm     PR End Diast Vel: 5.20 msec AV Vmax:           103.30 cm/s AV Vmean:          60.900 cm/s AV VTI:            0.185 m AV Peak Grad:      4.3 mmHg AV Mean Grad:      2.0 mmHg LVOT Vmax:         91.40  cm/s LVOT Vmean:        56.000 cm/s LVOT VTI:          0.187 m LVOT/AV VTI ratio: 1.01 AI PHT:            923 msec  AORTA Ao Root diam: 4.40 cm Ao Asc diam:  4.50 cm MITRAL VALVE               TRICUSPID VALVE MV Area (PHT): 1.83 cm    TR Peak grad:   18.3 mmHg MV Decel Time: 414 msec    TR Vmax:        214.00 cm/s MR Peak grad: 50.6 mmHg MR Vmax:      355.67 cm/s  SHUNTS MV E velocity: 40.70 cm/s  Systemic VTI:  0.19 m MV A velocity: 79.70 cm/s  Systemic Diam: 2.20 cm MV E/A ratio:  0.51 Rudean Haskell MD Electronically signed by Rudean Haskell MD Signature Date/Time: 05/24/2022/12:49:00 PM    Final         Scheduled Meds:  amLODipine  10 mg Oral Daily   aspirin EC  81 mg Oral Daily   brimonidine  1 drop Both Eyes BID   Chlorhexidine Gluconate Cloth  6 each Topical Daily   cloNIDine  0.2 mg Oral BID   [START ON 05/26/2022] clopidogrel  75 mg Oral Daily   divalproex  125 mg Oral BID   dorzolamide-timolol  1 drop Both Eyes BID   heparin  5,000 Units Subcutaneous Q8H   latanoprost  1 drop Both Eyes QHS   sertraline  50 mg Oral Daily   Continuous Infusions:  niCARDipine Stopped (05/23/22 1223)     LOS: 2 days    Time spent: 35 minutes    Georgette Shell, MD 05/25/2022, 3:00 PM

## 2022-05-26 DIAGNOSIS — R001 Bradycardia, unspecified: Secondary | ICD-10-CM

## 2022-05-26 DIAGNOSIS — F0390 Unspecified dementia without behavioral disturbance: Secondary | ICD-10-CM | POA: Diagnosis not present

## 2022-05-26 DIAGNOSIS — I161 Hypertensive emergency: Secondary | ICD-10-CM | POA: Diagnosis not present

## 2022-05-26 DIAGNOSIS — I10 Essential (primary) hypertension: Secondary | ICD-10-CM

## 2022-05-26 MED ORDER — ACETAMINOPHEN 325 MG PO TABS: 650.0000 mg | ORAL_TABLET | Freq: Four times a day (QID) | ORAL | Status: DC | PRN

## 2022-05-26 MED ORDER — METOPROLOL TARTRATE 12.5 MG HALF TABLET
12.5000 mg | ORAL_TABLET | Freq: Two times a day (BID) | ORAL | Status: DC
  Administered 2022-05-27 – 2022-05-28 (×2): 12.5 mg via ORAL
  Filled 2022-05-26 (×3): qty 1

## 2022-05-26 MED ORDER — METOPROLOL TARTRATE 25 MG PO TABS: 25.0000 mg | ORAL_TABLET | Freq: Two times a day (BID) | ORAL | Status: DC

## 2022-05-26 NOTE — Progress Notes (Signed)
Physical Therapy Treatment Patient Details Name: [name] [name] MRN: [id number] DOB: Jun 09, 1932 Today's Date: 05/26/2022   History of Present Illness Patient is a 88 y/o male who presents on 9/10 due to falls and weakness. Admitted with encephalopathy secondary to HTN emergency. Brain MRI- acute infarct left external capsule and radiating white matter tracts.Marland Kitchen PMH includes HTN, bradycardia, CAD, cardiomyopathy, probable Alzheimer's.    PT Comments    Pt was somewhat restless and agitated this session.  Emphasis on transitions, scooting, sitting balance at EOB, sit to stands, but unable to progress gait or get participation for standing/ADL activities.   Recommendations for follow up therapy are one component of a multi-disciplinary discharge planning process, led by the attending physician.  Recommendations may be updated based on patient status, additional functional criteria and insurance authorization.  Follow Up Recommendations  Skilled nursing-short term rehab (<3 hours/day) Can patient physically be transported by private vehicle: Yes   Assistance Recommended at Discharge    Patient can return home with the following A lot of help with walking and/or transfers;A lot of help with bathing/dressing/bathroom;Assistance with cooking/housework;Assist for transportation;Help with stairs or ramp for entrance   Equipment Recommendations  Other (comment) (TBD)    Recommendations for Other Services       Precautions / Restrictions Precautions Precautions: Fall     Mobility  Bed Mobility Overal bed mobility: Needs Assistance Bed Mobility: Supine to Sit, Sit to Supine     Supine to sit: Max assist, +2 for physical assistance, HOB elevated Sit to supine: Max assist, +2 for physical assistance, HOB elevated   General bed mobility comments: pt was able to assist if he chose, mostly needed 2 person assist for truncal assist, to stay on task and for general stability.     Transfers Overall transfer level: Needs assistance Equipment used: 1 person hand held assist, 2 person hand held assist Transfers: Sit to/from Stand Sit to Stand: Max assist, +2 physical assistance, +2 safety/equipment           General transfer comment: tactile cues for direction.  Assist forward and up.  Stability assist of 2 once up standing.    Ambulation/Gait               General Gait Details: unable to gain enough cooperation to make steps safe.   Stairs             Wheelchair Mobility    Modified Rankin (Stroke Patients Only) Modified Rankin (Stroke Patients Only) Modified Rankin: Moderately severe disability     Balance Overall balance assessment: Needs assistance Sitting-balance support: Feet supported, No upper extremity supported Sitting balance-Leahy Scale: Poor Sitting balance - Comments: consistent lean to the Right throughout the ~8-10 min at EOB and between standing trials. Postural control: Right lateral lean, Posterior lean Standing balance support: During functional activity Standing balance-Leahy Scale: Poor Standing balance comment: reliant on external support bilaterally.                            Cognition Arousal/Alertness: Awake/alert Behavior During Therapy: Agitated, Restless Overall Cognitive Status: History of cognitive impairments - at baseline                                 General Comments: pt was calling out to his wife, then agitated because he was cold, then upset that he was being "messed with"  Exercises      General Comments General comments (skin integrity, edema, etc.): VSS overall.  [name] [name] was generally agitated just before and during our session, wanting his wife to come help him.      Pertinent Vitals/Pain Pain Assessment Pain Assessment: Faces Faces Pain Scale: Hurts a little bit Pain Location: general Pain Descriptors / Indicators: Grimacing, Guarding Pain  Intervention(s): Monitored during session    Home Living                          Prior Function            PT Goals (current goals can now be found in the care plan section) Acute Rehab PT Goals Patient Stated Goal: to get better and come home PT Goal Formulation: With patient Time For Goal Achievement: 06/07/22 Potential to Achieve Goals: Fair Progress towards PT goals: Progressing toward goals    Frequency    Min 3X/week      PT Plan Discharge plan needs to be updated;Frequency needs to be updated    Co-evaluation PT/OT/SLP Co-Evaluation/Treatment: Yes Reason for Co-Treatment: For patient/therapist safety;Necessary to address cognition/behavior during functional activity PT goals addressed during session: Mobility/safety with mobility        AM-PAC PT "6 Clicks" Mobility   Outcome Measure  Help needed turning from your back to your side while in a flat bed without using bedrails?: A Lot Help needed moving from lying on your back to sitting on the side of a flat bed without using bedrails?: A Lot Help needed moving to and from a bed to a chair (including a wheelchair)?: Total Help needed standing up from a chair using your arms (e.g., wheelchair or bedside chair)?: Total Help needed to walk in hospital room?: Total Help needed climbing 3-5 steps with a railing? : Total 6 Click Score: 8    End of Session   Activity Tolerance: Treatment limited secondary to agitation;Patient limited by fatigue Patient left: in bed;with call bell/phone within reach;with bed alarm set;Other (comment) Nurse Communication: Mobility status PT Visit Diagnosis: Other abnormalities of gait and mobility (R26.89);Difficulty in walking, not elsewhere classified (R26.2);Other symptoms and signs involving the nervous system (R29.898) Hemiplegia - Right/Left: Right Hemiplegia - dominant/non-dominant: Dominant Hemiplegia - caused by: Cerebral infarction     Time: 1204-1222 PT  Time Calculation (min) (ACUTE ONLY): 18 min  Charges:  $Therapeutic Activity: 8-22 mins                     05/26/2022  Ginger Carne., PT Acute Rehabilitation Services [phone]  (pager) [phone]  (office)   [name] [name] 05/26/2022, 1:11 PM

## 2022-05-26 NOTE — Progress Notes (Signed)
PROGRESS NOTE    [name] [name]  OMB:[id number] DOB: [birth year] DOA: 05/23/2022 PCP: [name], DO   Brief Narrative: Patient was admitted by [facility] 05/24/2019 [place] 05/24/2022 88 year old male lives at home with his wife with history of dementia, CKD stage IIIb, CAD, BPH admitted with generalized weakness.  Family reported multiple falls prior to admission.  There was a concern for more right-sided weakness than left-sided.  On admission his blood pressure was 232/111 and he was confused.  He was initially started on nicardipine infusion.  Thereafter he was on clonidine Norvasc and metoprolol.  However metoprolol had to be held due to bradycardia.  Assessment & Plan:   Principal Problem:   Hypertensive emergency Active Problems:   Hyperlipidemia LDL goal <100   Primary open-angle glaucoma, severe stage   Essential hypertension   Bradycardia   Stroke ([place])   Hypokalemia   Dementia without behavioral disturbance ([place])   Depression   #1 hypertensive emergency patient was treated with nicardipine drip.  Currently on Norvasc 10 mg daily, clonidine 0.2 mg twice a day -metoprolol initially held due to bradycardia, but will be resumed today -Echo with EF 55 to 60%.  No regional wall motion abnormalities.  Severe asymmetric LVH.  Apical hypertrophic cardiomyopathy.  No evidence of thrombus.  Grade 1 diastolic dysfunction.  #2 bradycardia. Echo with normal EF. Troponin flat. HR 60's to 70s. Restarted low dose metoprolol  #3  Left external capsule acute infarct -patient with right-sided weakness and right facial droop.  MRI brain with acute external capsule infarct.  On 9/12, patient had a code stroke with increased right-sided weakness slurred speech and decreased responsiveness.  Patient had trouble swallowing pills which she was swallowing before.  Stat CT of the head did confirm the MRI findings from the day prior.     EEG shows mild diffuse encephalopathy.  No seizures or  epileptiform discharges noted.   CT head on admission did not reveal any acute findings other than old right occipital stroke and atrophy.  Seen by neurology and started on DAPT for 3 months Currently NPO until he can be seen by speech therapy CT angiogram of the head and neck multifocal severe stenosis of the right PCA with critical stenosis versus short segment occlusion of the right PCA P2 segment.  Approximately 50% stenosis of the proximal left internal Carotid artery.  Multiple old infarcts and chronic ischemic microangiopathy. LDL 120, continue statin A1C 4.6 PT/OT following to help determine disposition  #4 hypokalemia resolved   #5 open angle glaucoma continue eyedrops  #6 history of dementia with recurrent falls on Depakote and Seroquel Seroquel stopped 05/25/2022  #7 depression on Zoloft  #8 hyperlipidemia LDL 120 Lipitor started  #9 Goals of care -palliative care following to help determine further goals of care -wife considering comfort care if he does not make meaningful improvements   Estimated body mass index is 17.83 kg/m as calculated from the following:   Height as of 04/29/22: '5\' 8"'$  (1.727 m).   Weight as of this encounter: 53.2 kg.  DVT prophylaxis: Heparin  code Status: DNR family Communication: Discussed with wife at the bedside disposition Plan:  Status is: Inpatient Remains inpatient appropriate because: time for goals of care   Consultants:  PCCM Neurology Palliative care  Procedures: None Antimicrobials: None  Subjective:  Sitting up on side of bed with physical therapy assisting Speech difficult to comprehend More awake today as compared to yesterday  Objective: Vitals:   05/26/22  0600 05/26/22 0737 05/26/22 1059 05/26/22 1142  BP: (!) 160/79  (!) 194/90   Pulse: 62     Resp: 16     Temp:  97.9 F (36.6 C)  97.6 F (36.4 C)  TempSrc:  Axillary  Oral  SpO2: 97%     Weight:        Intake/Output Summary (Last 24 hours) at  05/26/2022 1235 Last data filed at 05/26/2022 0530 Gross per 24 hour  Intake --  Output 850 ml  Net -850 ml   Filed Weights   05/24/22 0500 05/25/22 0500 05/26/22 0406  Weight: 57.8 kg 55.8 kg 53.2 kg    Examination:  General exam: Appears in no acute distress Respiratory system: Clear to auscultation. Respiratory effort normal. Cardiovascular system: S1 & S2 heard, RRR. No JVD, murmurs, rubs, gallops or clicks. No pedal edema. Gastrointestinal system: Abdomen is nondistended, soft and nontender. No organomegaly or masses felt. Normal bowel sounds heard. Central nervous system: awake, speech difficult to comprehend, has right sided facial droop and right sided weakness Extremities: No edema Skin: No rashes, lesions or ulcers Psychiatry: Unable to assess   Data Reviewed: I have personally reviewed following labs and imaging studies  CBC: Recent Labs  Lab 05/23/22 0230 05/23/22 0830 05/24/22 0517 05/25/22 0618  WBC 6.3 14.8* 7.6 9.2  NEUTROABS 4.2  --   --   --   HGB 12.2* 13.4 11.3* 13.5  HCT 36.6* 38.8* 34.0* 38.9*  MCV 92.7 91.3 93.2 90.7  PLT 211 195 163 637   Basic Metabolic Panel: Recent Labs  Lab 05/23/22 0230 05/23/22 0830 05/24/22 0517 05/25/22 0618  NA 140 139 137 138  K 3.6 3.3* 3.4* 4.3  CL 104 107 108 106  CO2 '23 22 22 22  '$ GLUCOSE 96 134* 82 79  BUN '16 14 13 20  '$ CREATININE 1.44* 1.20 1.22 1.31*  CALCIUM 8.8* 8.6* 8.5* 8.9  MG  --  1.9 2.0  --    GFR: Estimated Creatinine Clearance: 29.3 mL/min (A) (by C-G formula based on SCr of 1.31 mg/dL (H)). Liver Function Tests: Recent Labs  Lab 05/23/22 0230  AST 18  ALT 11  ALKPHOS 46  BILITOT 1.2  PROT 7.1  ALBUMIN 3.4*   No results for input(s): "LIPASE", "AMYLASE" in the last 168 hours. No results for input(s): "AMMONIA" in the last 168 hours. Coagulation Profile: No results for input(s): "INR", "PROTIME" in the last 168 hours. Cardiac Enzymes: No results for input(s): "CKTOTAL", "CKMB",  "CKMBINDEX", "TROPONINI" in the last 168 hours. BNP (last 3 results) No results for input(s): "PROBNP" in the last 8760 hours. HbA1C: Recent Labs    05/24/22 0517  HGBA1C 4.6*   CBG: Recent Labs  Lab 05/23/22 0624 05/23/22 0751  GLUCAP 120* 135*   Lipid Profile: Recent Labs    05/25/22 0618  CHOL 174  HDL 41  LDLCALC 120*  TRIG 66  CHOLHDL 4.2   Thyroid Function Tests: No results for input(s): "TSH", "T4TOTAL", "FREET4", "T3FREE", "THYROIDAB" in the last 72 hours. Anemia Panel: No results for input(s): "VITAMINB12", "FOLATE", "FERRITIN", "TIBC", "IRON", "RETICCTPCT" in the last 72 hours. Sepsis Labs: No results for input(s): "PROCALCITON", "LATICACIDVEN" in the last 168 hours.  Recent Results (from the past 240 hour(s))  SARS Coronavirus 2 by RT PCR (hospital order, performed in [facility] hospital lab) *cepheid single result test* Anterior Nasal Swab     Status: None   Collection Time: 05/23/22  5:18 AM   Specimen: Anterior Nasal  Swab  Result Value Ref Range Status   SARS Coronavirus 2 by RT PCR NEGATIVE NEGATIVE Final    Comment: (NOTE) SARS-CoV-2 target nucleic acids are NOT DETECTED.  The SARS-CoV-2 RNA is generally detectable in upper and lower respiratory specimens during the acute phase of infection. The lowest concentration of SARS-CoV-2 viral copies this assay can detect is 250 copies / mL. A negative result does not preclude SARS-CoV-2 infection and should not be used as the sole basis for treatment or other patient management decisions.  A negative result may occur with improper specimen collection / handling, submission of specimen other than nasopharyngeal swab, presence of viral mutation(s) within the areas targeted by this assay, and inadequate number of viral copies (<250 copies / mL). A negative result must be combined with clinical observations, patient history, and epidemiological information.  Fact Sheet for Patients:    https://www.patel.info/  Fact Sheet for Healthcare Providers: https://hall.com/  This test is not yet approved or  cleared by the [place] FDA and has been authorized for detection and/or diagnosis of SARS-CoV-2 by FDA under an Emergency Use Authorization (EUA).  This EUA will remain in effect (meaning this test can be used) for the duration of the COVID-19 declaration under Section 564(b)(1) of the Act, 21 U.S.C. section 360bbb-3(b)(1), unless the authorization is terminated or revoked sooner.  Performed at [facility], [place] [address] [place], [place] [address]   MRSA Next Gen by PCR, Nasal     Status: None   Collection Time: 05/23/22  9:20 AM   Specimen: Nasal Mucosa; Nasal Swab  Result Value Ref Range Status   MRSA by PCR Next Gen NOT DETECTED NOT DETECTED Final    Comment: (NOTE) The GeneXpert MRSA Assay (FDA approved for NASAL specimens only), is one component of a comprehensive MRSA colonization surveillance program. It is not intended to diagnose MRSA infection nor to guide or monitor treatment for MRSA infections. Test performance is not FDA approved in patients less than 6 years old. Performed at [facility], [place] [address], [place], [place] [address]          Radiology Studies: EEG adult  Result Date: 2022-06-11 Lora Havens, MD     06-11-22  1:25 PM Patient Name: DASANI CREAR MRN: [id number] Epilepsy Attending: Lora Havens Referring Physician/Provider: Janine Ores, NP Date: 06/11/2022 Duration: 24.14 mins Patient history: 88 year old patient with history of dementia, glaucoma, HTN, renal insufficiency, CAD and BPH presents with right sided weakness and multiple falls at home along with hypertensive emergency and confusion.  EEG to evaluate for seizure. Level of alertness: Awake AEDs during EEG study: None Technical aspects: This EEG study was done with scalp electrodes positioned according  to the 10-20 International system of electrode placement. Electrical activity was reviewed with band pass filter of 1-'70Hz'$ , sensitivity of 7 uV/mm, display speed of 9m/sec with a '60Hz'$  notched filter applied as appropriate. EEG data were recorded continuously and digitally stored.  Video monitoring was available and reviewed as appropriate. Description: The posterior dominant rhythm consists of 8 Hz activity of moderate voltage (25-35 uV) seen predominantly in posterior head regions, symmetric and reactive to eye opening and eye closing. EEG showed intermittent generalized 3 to 6 Hz theta-delta slowing. Hyperventilation and photic stimulation were not performed.   Parts of study were difficult to interpret due to significant myogenic and electrode artifact ABNORMALITY - Intermittent slow, generalized IMPRESSION: This technically difficult study is suggestive of mild diffuse  encephalopathy, nonspecific etiology. No seizures or epileptiform discharges were seen throughout the recording. Lora Havens   DG CHEST PORT 1 VIEW  Result Date: 05/25/2022 CLINICAL DATA:  Respiratory abnormalities, query aspiration EXAM: PORTABLE CHEST 1 VIEW COMPARISON:  05/23/2022 FINDINGS: Cardiomegaly. Both lungs are clear. The visualized skeletal structures are unremarkable. IMPRESSION: Cardiomegaly without acute abnormality of the lungs in AP portable projection. Electronically Signed   By: Delanna Ahmadi M.D.   On: 05/25/2022 11:18   CT ANGIO HEAD NECK W WO CM (CODE STROKE)  Result Date: 05/25/2022 CLINICAL DATA:  Neuro deficit, acute, stroke suspected EXAM: CT ANGIOGRAPHY HEAD AND NECK TECHNIQUE: Multidetector CT imaging of the head and neck was performed using the standard protocol during bolus administration of intravenous contrast. Multiplanar CT image reconstructions and MIPs were obtained to evaluate the vascular anatomy. Carotid stenosis measurements (when applicable) are obtained utilizing NASCET criteria, using the  distal internal carotid diameter as the denominator. RADIATION DOSE REDUCTION: This exam was performed according to the departmental dose-optimization program which includes automated exposure control, adjustment of the mA and/or kV according to patient size and/or use of iterative reconstruction technique. CONTRAST:  50m OMNIPAQUE IOHEXOL 350 MG/ML SOLN COMPARISON:  Same day CTA FINDINGS: CTA NECK FINDINGS Aortic arch: Great vessel origins are patent without significant stenosis. Aortic atherosclerosis Right carotid system: Atherosclerosis at the carotid bifurcation without greater than 50% stenosis. Left carotid system: Atherosclerosis at the carotid bifurcation approximately 50-60% stenosis the proximal IC Vertebral arteries: Left dominant. No evidence of significant (greater than 50%) stenosis. Skeleton: Multilevel degenerative change. Other neck: No acute findings. Upper chest: Partially imaged ground-glass opacities lateral aspect of the left upper lobe. Review of the MIP images confirms the above findings CTA HEAD FINDINGS Anterior circulation: Intracranial ICAs are patent with mild narrowing due to calcific atherosclerosis. Bilateral MCAs and ACAs are patent. Similar moderate left M1 MCA stenosis. Posterior circulation: Bilateral intradural vertebral arteries and basilar artery are patent. Similar multifocal severe stenosis of the right PCA with critical stenosis versus occlusion of the right P2 PCA and distal opacification. Left PCA is patent Venous sinuses: As permitted by contrast timing, patent. Review of the MIP images confirms the above findings IMPRESSION: 1. Similar multifocal severe stenosis of the right PCA with critical stenosis versus occlusion of the right P2 PCA and distal opacification. 2. Similar moderate left M1 MCA stenosis. 3. Similar 50-60% stenosis of the proximal left ICA in the neck. 4. Partially imaged ground-glass opacities lateral aspect of the left upper lobe, potentially  infectious/inflammatory. Recommend correlation with dedicated chest imaging. Electronically Signed   By: FMargaretha SheffieldM.D.   On: 05/25/2022 10:57   CT HEAD CODE STROKE WO CONTRAST  Result Date: 05/25/2022 CLINICAL DATA:  Code stroke.  Neuro deficit, acute, stroke suspected EXAM: CT HEAD WITHOUT CONTRAST TECHNIQUE: Contiguous axial images were obtained from the base of the skull through the vertex without intravenous contrast. RADIATION DOSE REDUCTION: This exam was performed according to the departmental dose-optimization program which includes automated exposure control, adjustment of the mA and/or kV according to patient size and/or use of iterative reconstruction technique. COMPARISON:  CT head 05/23/22 and MRI head 05/24/22 FINDINGS: Brain: Known acute infarct in the left external capsule and white matter tracks better characterized on recent MRI. No evidence of interval infarct or acute hemorrhage. Remote right occipital infarct. No significant mass effect or midline shift no hydrocephalus. Chronic microvascular ischemic disease and cerebral atrophy Vascular: No hyperdense vessel. Skull: No acute fracture. Sinuses/Orbits: No  acute findings. IMPRESSION: Known acute infarct in the left external capsule and white matter tracks better characterized on recent MRI. No evidence of interval infarct by CT or acute hemorrhage. Code stroke imaging results were communicated on 05/25/2022 at 10:29 am to provider Dr. Reeves Forth Via secure text paging. Electronically Signed   By: Margaretha Sheffield M.D.   On: 05/25/2022 10:30   CT ANGIO HEAD NECK W WO CM  Result Date: 05/25/2022 CLINICAL DATA:  Stroke follow-up EXAM: CT ANGIOGRAPHY HEAD AND NECK TECHNIQUE: Multidetector CT imaging of the head and neck was performed using the standard protocol during bolus administration of intravenous contrast. Multiplanar CT image reconstructions and MIPs were obtained to evaluate the vascular anatomy. Carotid stenosis measurements  (when applicable) are obtained utilizing NASCET criteria, using the distal internal carotid diameter as the denominator. RADIATION DOSE REDUCTION: This exam was performed according to the departmental dose-optimization program which includes automated exposure control, adjustment of the mA and/or kV according to patient size and/or use of iterative reconstruction technique. CONTRAST:  40m OMNIPAQUE IOHEXOL 350 MG/ML SOLN COMPARISON:  None Available. FINDINGS: CT HEAD FINDINGS Brain: There is no mass, hemorrhage or extra-axial collection. The size and configuration of the ventricles and extra-axial CSF spaces are normal. There are multiple old infarcts, including a right occipital infarct. There is hypoattenuation of the periventricular white matter, most commonly indicating chronic ischemic microangiopathy. Skull: The visualized skull base, calvarium and extracranial soft tissues are normal. Sinuses/Orbits: No fluid levels or advanced mucosal thickening of the visualized paranasal sinuses. No mastoid or middle ear effusion. There are bilateral lens replacements. CTA NECK FINDINGS SKELETON: There is no bony spinal canal stenosis. No lytic or blastic lesion. OTHER NECK: Normal pharynx, larynx and major salivary glands. No cervical lymphadenopathy. Unremarkable thyroid gland. UPPER CHEST: No pneumothorax or pleural effusion. No nodules or masses. AORTIC ARCH: There is no calcific atherosclerosis of the aortic arch. There is no aneurysm, dissection or hemodynamically significant stenosis of the visualized portion of the aorta. Conventional 3 vessel aortic branching pattern. The visualized proximal subclavian arteries are widely patent. RIGHT CAROTID SYSTEM: No dissection, occlusion or aneurysm. Mild atherosclerotic calcification at the carotid bifurcation without hemodynamically significant stenosis. LEFT CAROTID SYSTEM: No dissection, occlusion or aneurysm. There is mixed density atherosclerosis extending into the  proximal ICA, resulting in 50% stenosis. VERTEBRAL ARTERIES: Codominant configuration. Both origins are clearly patent. No focal abnormality. CTA HEAD FINDINGS POSTERIOR CIRCULATION: --Vertebral arteries: Normal V4 segments. --Inferior cerebellar arteries: Normal. --Basilar artery: Normal. --Superior cerebellar arteries: Normal. --Posterior cerebral arteries (PCA): Multifocal severe stenosis of the right PCA. There is a critical stenosis versus short segment occlusion of the right P2 segment with maintained distal opacification. The left PCA is patent. ANTERIOR CIRCULATION: --Intracranial internal carotid arteries: Atherosclerotic calcification of the internal carotid arteries at the skull base without hemodynamically significant stenosis. --Anterior cerebral arteries (ACA): Normal. Both A1 segments are present. Patent anterior communicating artery (a-comm). --Middle cerebral arteries (MCA): Normal. VENOUS SINUSES: As permitted by contrast timing, patent. ANATOMIC VARIANTS: None Review of the MIP images confirms the above findings. IMPRESSION: 1. Multifocal severe stenosis of the right PCA with critical stenosis versus short segment occlusion of the right PCA P2 segment. Distal opacification is maintained. 2. Approximately 50% stenosis of the proximal left internal carotid artery. 3. Multiple old infarcts and chronic ischemic microangiopathy. Electronically Signed   By: KUlyses JarredM.D.   On: 05/25/2022 02:02   MR BRAIN WO CONTRAST  Result Date: 05/24/2022 CLINICAL DATA:  Stroke. Follow-up.  Dementia. Renal disease. Generalized weakness with falling. EXAM: MRI HEAD WITHOUT CONTRAST TECHNIQUE: Multiplanar, multiecho pulse sequences of the brain and surrounding structures were obtained without intravenous contrast. COMPARISON:  Head CT yesterday.  MRI 03/20/2022. FINDINGS: Brain: Diffusion imaging shows acute infarction in the left external capsule and radiating white matter tracts. No other acute insult. No  evidence of swelling or hemorrhage. No focal abnormality affects the brainstem or cerebellum. Cerebral hemispheres show generalized atrophy with minimal chronic small-vessel ischemic changes within the white matter and old bilateral occipital infarctions, right larger than left. No mass. No hydrocephalus or extra-axial collection. Vascular: Major vessels at the base of the brain show flow. Skull and upper cervical spine: Negative Sinuses/Orbits: Clear/normal Other: None IMPRESSION: Acute infarction of the left external capsule and radiating white matter tracts. No mass effect or hemorrhage. Old occipital infarctions right larger than left and minimal chronic small-vessel changes of the white matter as seen previously. Electronically Signed   By: Nelson Chimes M.D.   On: 05/24/2022 13:18        Scheduled Meds:  amLODipine  10 mg Oral Daily   aspirin EC  81 mg Oral Daily   atorvastatin  80 mg Oral Daily   brimonidine  1 drop Both Eyes BID   Chlorhexidine Gluconate Cloth  6 each Topical Daily   cloNIDine  0.2 mg Oral BID   clopidogrel  75 mg Oral Daily   divalproex  125 mg Oral BID   dorzolamide-timolol  1 drop Both Eyes BID   heparin  5,000 Units Subcutaneous Q8H   latanoprost  1 drop Both Eyes QHS   metoprolol tartrate  25 mg Oral BID   sertraline  50 mg Oral Daily   Continuous Infusions:     LOS: 3 days    Time spent: 35 minutes    Kathie Dike, MD 05/26/2022, 12:35 PM

## 2022-05-26 NOTE — Progress Notes (Signed)
Inpatient Rehab Admissions Coordinator:   Reviewed consult from Palliative care.  Will follow distantly for GOC/possible transition to comfort care.   Shann Medal, PT, DPT Admissions Coordinator [phone] 05/26/22  9:16 AM

## 2022-05-26 NOTE — Progress Notes (Addendum)
STROKE TEAM PROGRESS NOTE   INTERVAL HISTORY Patient is laying in bed In NAD. No family at the bedside. He is alert and oriented to self only. He has right facial droop, right arm and leg weakness, able to lift antigravity. Neurological exam unchanged. No new neurological events overnight  Vitals:   05/26/22 0500 05/26/22 0530 05/26/22 0600 05/26/22 0737  BP: (!) 172/71 (!) 176/85 (!) 160/79   Pulse: 66 67 62   Resp: '17 18 16   '$ Temp:    97.9 F (36.6 C)  TempSrc:    Axillary  SpO2: 96% 98% 97%   Weight:       CBC:  Recent Labs  Lab 05/23/22 0230 05/23/22 0830 05/24/22 0517 05/25/22 0618  WBC 6.3   < > 7.6 9.2  NEUTROABS 4.2  --   --   --   HGB 12.2*   < > 11.3* 13.5  HCT 36.6*   < > 34.0* 38.9*  MCV 92.7   < > 93.2 90.7  PLT 211   < > 163 200   < > = values in this interval not displayed.    Basic Metabolic Panel:  Recent Labs  Lab 05/23/22 0830 05/24/22 0517 05/25/22 0618  NA 139 137 138  K 3.3* 3.4* 4.3  CL 107 108 106  CO2 '22 22 22  '$ GLUCOSE 134* 82 79  BUN '14 13 20  '$ CREATININE 1.20 1.22 1.31*  CALCIUM 8.6* 8.5* 8.9  MG 1.9 2.0  --     Lipid Panel:  Recent Labs  Lab 05/25/22 0618  CHOL 174  TRIG 66  HDL 41  CHOLHDL 4.2  VLDL 13  LDLCALC 120*    HgbA1c:  Recent Labs  Lab 05/24/22 0517  HGBA1C 4.6*    Urine Drug Screen: No results for input(s): "LABOPIA", "COCAINSCRNUR", "LABBENZ", "AMPHETMU", "THCU", "LABBARB" in the last 168 hours.  Alcohol Level No results for input(s): "ETH" in the last 168 hours.  IMAGING past 24 hours EEG adult  Result Date: 05/25/2022 Lora Havens, MD     05/25/2022  1:25 PM Patient Name: [name] [name] MRN: [id number] Epilepsy Attending: Lora Havens Referring Physician/Provider: Janine Ores, NP Date: 05/25/2022 Duration: 24.14 mins Patient history: 88 year old patient with history of dementia, glaucoma, HTN, renal insufficiency, CAD and BPH presents with right sided weakness and multiple falls at home along  with hypertensive emergency and confusion.  EEG to evaluate for seizure. Level of alertness: Awake AEDs during EEG study: None Technical aspects: This EEG study was done with scalp electrodes positioned according to the 10-20 International system of electrode placement. Electrical activity was reviewed with band pass filter of 1-'70Hz'$ , sensitivity of 7 uV/mm, display speed of 89m/sec with a '60Hz'$  notched filter applied as appropriate. EEG data were recorded continuously and digitally stored.  Video monitoring was available and reviewed as appropriate. Description: The posterior dominant rhythm consists of 8 Hz activity of moderate voltage (25-35 uV) seen predominantly in posterior head regions, symmetric and reactive to eye opening and eye closing. EEG showed intermittent generalized 3 to 6 Hz theta-delta slowing. Hyperventilation and photic stimulation were not performed.   Parts of study were difficult to interpret due to significant myogenic and electrode artifact ABNORMALITY - Intermittent slow, generalized IMPRESSION: This technically difficult study is suggestive of mild diffuse encephalopathy, nonspecific etiology. No seizures or epileptiform discharges were seen throughout the recording. PLora Havens  DG CHEST PORT 1 VIEW  Result Date: 05/25/2022 CLINICAL DATA:  Respiratory abnormalities, query aspiration EXAM: PORTABLE CHEST 1 VIEW COMPARISON:  05/23/2022 FINDINGS: Cardiomegaly. Both lungs are clear. The visualized skeletal structures are unremarkable. IMPRESSION: Cardiomegaly without acute abnormality of the lungs in AP portable projection. Electronically Signed   By: Delanna Ahmadi M.D.   On: 05/25/2022 11:18   CT ANGIO HEAD NECK W WO CM (CODE STROKE)  Result Date: 05/25/2022 CLINICAL DATA:  Neuro deficit, acute, stroke suspected EXAM: CT ANGIOGRAPHY HEAD AND NECK TECHNIQUE: Multidetector CT imaging of the head and neck was performed using the standard protocol during bolus administration of  intravenous contrast. Multiplanar CT image reconstructions and MIPs were obtained to evaluate the vascular anatomy. Carotid stenosis measurements (when applicable) are obtained utilizing NASCET criteria, using the distal internal carotid diameter as the denominator. RADIATION DOSE REDUCTION: This exam was performed according to the departmental dose-optimization program which includes automated exposure control, adjustment of the mA and/or kV according to patient size and/or use of iterative reconstruction technique. CONTRAST:  38m OMNIPAQUE IOHEXOL 350 MG/ML SOLN COMPARISON:  Same day CTA FINDINGS: CTA NECK FINDINGS Aortic arch: Great vessel origins are patent without significant stenosis. Aortic atherosclerosis Right carotid system: Atherosclerosis at the carotid bifurcation without greater than 50% stenosis. Left carotid system: Atherosclerosis at the carotid bifurcation approximately 50-60% stenosis the proximal IC Vertebral arteries: Left dominant. No evidence of significant (greater than 50%) stenosis. Skeleton: Multilevel degenerative change. Other neck: No acute findings. Upper chest: Partially imaged ground-glass opacities lateral aspect of the left upper lobe. Review of the MIP images confirms the above findings CTA HEAD FINDINGS Anterior circulation: Intracranial ICAs are patent with mild narrowing due to calcific atherosclerosis. Bilateral MCAs and ACAs are patent. Similar moderate left M1 MCA stenosis. Posterior circulation: Bilateral intradural vertebral arteries and basilar artery are patent. Similar multifocal severe stenosis of the right PCA with critical stenosis versus occlusion of the right P2 PCA and distal opacification. Left PCA is patent Venous sinuses: As permitted by contrast timing, patent. Review of the MIP images confirms the above findings IMPRESSION: 1. Similar multifocal severe stenosis of the right PCA with critical stenosis versus occlusion of the right P2 PCA and distal  opacification. 2. Similar moderate left M1 MCA stenosis. 3. Similar 50-60% stenosis of the proximal left ICA in the neck. 4. Partially imaged ground-glass opacities lateral aspect of the left upper lobe, potentially infectious/inflammatory. Recommend correlation with dedicated chest imaging. Electronically Signed   By: FMargaretha SheffieldM.D.   On: 05/25/2022 10:57   CT HEAD CODE STROKE WO CONTRAST  Result Date: 05/25/2022 CLINICAL DATA:  Code stroke.  Neuro deficit, acute, stroke suspected EXAM: CT HEAD WITHOUT CONTRAST TECHNIQUE: Contiguous axial images were obtained from the base of the skull through the vertex without intravenous contrast. RADIATION DOSE REDUCTION: This exam was performed according to the departmental dose-optimization program which includes automated exposure control, adjustment of the mA and/or kV according to patient size and/or use of iterative reconstruction technique. COMPARISON:  CT head 05/23/22 and MRI head 05/24/22 FINDINGS: Brain: Known acute infarct in the left external capsule and white matter tracks better characterized on recent MRI. No evidence of interval infarct or acute hemorrhage. Remote right occipital infarct. No significant mass effect or midline shift no hydrocephalus. Chronic microvascular ischemic disease and cerebral atrophy Vascular: No hyperdense vessel. Skull: No acute fracture. Sinuses/Orbits: No acute findings. IMPRESSION: Known acute infarct in the left external capsule and white matter tracks better characterized on recent MRI. No evidence of interval infarct by CT or acute hemorrhage.  Code stroke imaging results were communicated on 05/25/2022 at 10:29 am to provider Dr. Reeves Forth Via secure text paging. Electronically Signed   By: Margaretha Sheffield M.D.   On: 05/25/2022 10:30    PHYSICAL EXAM GENERAL: awake, alert in no acute distress LUNGS: Normal respiratory effort. Non-labored breathing on room air CV: Regular rate and rhythm on telemetry   NEURO:   Mental Status:  Alert, oriented to self. States he is 88y/o and the year as "50". Does not know location or why he came to the hospital. Speech is soft, but clear and fluent.  Cranial Nerves:  II: Right pupil larger than left and off center (28m right 232mleft), this is his baseline per wife, unable to follow directions to test visual fields.  III, IV, VI: EOMI. Lid elevation symmetric and full.  V: Sensation is intact to light touch and symmetrical to face.  VII: Right sided facial droop VIII: Hearing intact to voice IX, X:Phonation normal.  XII: Tongue protrudes midline without fasciculations.   Motor: 4+/5 strength to LUE and LLE RUE 3/5, RLE 4-/5- seems to be partially effort dependent Tone is normal. Bulk is normal.  Sensation: Intact to light touch bilaterally in all four extremities. No extinction to DSS present.  Coordination: Drift in right arm and leg. No gross ataxia noted, difficulty following directions  Gait: Deferred  ASSESSMENT/PLAN Mr. LaJAYDIAN SANTANAs a [age].o. male with history of  dementia, bradycardia vs irregular heartbeat, glaucoma, HTN, renal insufficiency, CAD and BPH presenting after multiple falls at home, right sided weakness, and hypertension. He started having falls on Saturday 9/9 and then EMS was called 9/10 and found him to be hypertensive. Imaging shows an acute left external capsule infarct. Previous CTA shows 70% stenosis of left carotid and less than 50% stenosis of right carotid.  Stroke:  Left external capsule infarct Etiology:  Small vessel disease   CT Head No acute intracranial abnormality. Chronic microvascular disease and brain atrophy. Remote right occipital lobe infarct. CTA head & neck Multifocal severe stenosis of the right PCA with critical stenosis versus short segment occlusion of the right PCA P2 segment. Approximately 50% stenosis of the proximal left internal carotid artery. MRI  Acute infarct of left external capsule and radiating  white matter tracts, old occipital infarcts 2D Echo EF 5073-53%grade I diastolic dysfunction LDL 12299gbA1c 4.6 VTE prophylaxis - SCDs    Diet   Diet NPO time specified   No antithrombotic prior to admission, now on aspirin 81 mg daily and clopidogrel 75 mg daily. For 3 months than ASA alone  Therapy recommendations:  Pending, CIR? Disposition:  pending  Hypertension Home meds:  amlodipine, clonidine, metoprolol succinate Stable. Goal sBP<180. Maintain normotension long term. Add metoprolol today.can take meds crushed per speech.  Hyperlipidemia Home meds:  None LDL 120, goal < 70 On lipitor '80mg'$ . Continue statin at discharge  Social  Palliative care involved. Will likely transition to comfort care in a few days if no improvement  Other Stroke Risk Factors Advanced Age >/= 6524Coronary artery disease  Other Active Problems Dementia  Home Meds: Depakote, Seroquel  Dysphagia Re-eval per SLP CXR Oral hygiene per protocol  Maintain aspiration precautions   Hospital day # 3  Patient seen and examined by NP/APP with MD. MD to update note as needed.   DeBeulah GandyNP, ACNPC-AG   ATTENDING ATTESTATION: 89ear old with left internal capsule stroke.  CTA showed improved stenosis in the left ICA 50%  from previous.  He also has a right PCA critical stenosis.  Exam much improved this am. Alert, awake. Knew his name and that he was in the hospital. Not oriented to time/president. Follows commands, no aphasia. Right arm drift but able to lift off the bed. No movement in right leg(says it's due to an old injury). Sensation intact b/l  Will add metoprolol today, have speech re eval pt. Ok to transfer out of the ICU.  Palliative care on board.     DAPT therapy for 3 months given intracranial stenosis.  Discussed with ICU nurse and Dr. Roderic Palau.  Neurology will sign off, please call with questions.    Dr. Reeves Forth evaluated pt independently, reviewed imaging, chart, labs.  Discussed and formulated plan with the Resident/APP. Changes were made to the note where appropriate. Please see APP/resident note above for details.         Kypton Eltringham,MD

## 2022-05-26 NOTE — Progress Notes (Signed)
Speech Language Pathology Treatment: Dysphagia  Patient Details Name: [name] [name] MRN: [id number] DOB: 06/05/1932 Today's Date: 05/26/2022 Time: 1330-1350 SLP Time Calculation (min) (ACUTE ONLY): 20 min  Assessment / Plan / Recommendation Clinical Impression  Patient seen by SLP for skilled treatment session focused on dysphagia goals. Patient's wife and another family member present in room. Wife states that patient does eat much at home and usually has two meals in a day.  When SLP entered room, patient was awake, alert, confused. He had recently finished session with PT and OT. He was a little resistant towards oral care and would act surprised  when SLP introduced toothette sponge into mouth. After oral care, SLP presented patient with cup of water, which he started to accept but then would not drink and told SLP he wanted "a  Pepsi". SLP retrieved a cola and patient was able to hold in mitted hand (left) and give self sips with SLP holding cup for support as it was leaning to right. He exhibited mild oral delays and suspected swallow initiation delay and although he did exhibit some instances of delayed coughing, this did sound congested. Patient then lost control of cup and spilled drink and ice on himself, which led to him starting to get agitated. SLP spoke some with his family regarding leaving him alone when he is getting upset so he will calm down. SLP is recommending continue NPO but allow floor stock liquids PRN when alert and awake. SLP will f/u next date.    HPI HPI: 88 year old male lives at home with his wife admitted with hypertension, generalized weakness but concern for predominant right-sided weakness. MRI acute infarction of the left external capsule and radiating white  matter tracts. Pmhx dementia, CKD stage IIIb, CAD, BPH admitted with generalized weakness.  Family reported multiple falls prior to admission.      SLP Plan  Continue with current plan of care       Recommendations for follow up therapy are one component of a multi-disciplinary discharge planning process, led by the attending physician.  Recommendations may be updated based on patient status, additional functional criteria and insurance authorization.    Recommendations  Diet recommendations: NPO;Other(comment) (allow PRN floor stock liquids with therapy or nursing only) Medication Administration: Crushed with puree Supervision: Trained caregiver to feed patient Compensations: Slow rate;Small sips/bites;Minimize environmental distractions                Oral Care Recommendations: Oral care QID;Oral care before and after PO;Oral care prior to ice chip/H20 Follow Up Recommendations: Skilled nursing-short term rehab (<3 hours/day) Assistance recommended at discharge: Frequent or constant Supervision/Assistance SLP Visit Diagnosis: Dysphagia, unspecified (R13.10) Plan: Continue with current plan of care          Sonia Baller, MA, CCC-SLP Speech Therapy

## 2022-05-26 NOTE — Progress Notes (Signed)
Occupational Therapy Treatment Patient Details Name: [name] [name] MRN: [id number] DOB: 1932-06-11 Today's Date: 05/26/2022   History of present illness Patient is a 88 y/o male who presents on 9/10 due to falls and weakness. Admitted with encephalopathy secondary to HTN emergency. Brain MRI- acute infarct left external capsule and radiating white matter tracts.Marland Kitchen PMH includes HTN, bradycardia, CAD, cardiomyopathy, probable Alzheimer's.   OT comments  Pt agitated and restless, calling out for his wife. +2 max assist needed  for bed mobility and sit to stand. Total assist to change gown. Pt with consistent R side and posterior lean in sitting. Updated d/c recommendation to SNF.   Recommendations for follow up therapy are one component of a multi-disciplinary discharge planning process, led by the attending physician.  Recommendations may be updated based on patient status, additional functional criteria and insurance authorization.    Follow Up Recommendations  Skilled nursing-short term rehab (<3 hours/day)    Assistance Recommended at Discharge Frequent or constant Supervision/Assistance  Patient can return home with the following  Two people to help with walking and/or transfers;Two people to help with bathing/dressing/bathroom;Assistance with cooking/housework;Direct supervision/assist for medications management;Direct supervision/assist for financial management;Assist for transportation;Help with stairs or ramp for entrance   Equipment Recommendations  BSC/3in1;Wheelchair cushion (measurements OT);Hospital bed;Wheelchair (measurements OT)    Recommendations for Other Services      Precautions / Restrictions Precautions Precautions: Fall Precaution Comments: visually impaired       Mobility Bed Mobility Overal bed mobility: Needs Assistance Bed Mobility: Supine to Sit, Sit to Supine     Supine to sit: Max assist, +2 for physical assistance, HOB elevated Sit to supine: Max  assist, +2 for physical assistance, HOB elevated   General bed mobility comments: pt was able to assist if he chose, mostly needed 2 person assist for truncal assist, to stay on task and for general stability.    Transfers Overall transfer level: Needs assistance Equipment used: 1 person hand held assist, 2 person hand held assist Transfers: Sit to/from Stand Sit to Stand: Max assist, +2 physical assistance, +2 safety/equipment           General transfer comment: tactile cues for direction.  Assist forward and up.  Stability assist of 2 once up standing.     Balance Overall balance assessment: Needs assistance Sitting-balance support: Feet supported, No upper extremity supported Sitting balance-Leahy Scale: Poor Sitting balance - Comments: consistent lean to the Right throughout the ~8-10 min at EOB and between standing trials. Postural control: Right lateral lean, Posterior lean Standing balance support: Bilateral upper extremity supported Standing balance-Leahy Scale: Zero Standing balance comment: reliant on external support bilaterally, R LE flexed throughout                           ADL either performed or assessed with clinical judgement   ADL                                         General ADL Comments: currently requiring total assist, changed gown    Extremity/Trunk Assessment              Vision       Perception     Praxis      Cognition Arousal/Alertness: Awake/alert Behavior During Therapy: Agitated, Restless Overall Cognitive Status: History of cognitive impairments - at baseline  General Comments: pt was calling out to his wife, then agitated because he was cold, then upset that he was being "messed with"        Exercises      Shoulder Instructions       General Comments VSS overall.  Mr Brawley was generally agitated just before and during our session, wanting his  wife to come help him.    Pertinent Vitals/ Pain       Pain Assessment Pain Assessment: Faces Faces Pain Scale: Hurts a little bit Pain Location: general Pain Descriptors / Indicators: Grimacing, Guarding Pain Intervention(s): Monitored during session, Repositioned  Home Living                                          Prior Functioning/Environment              Frequency  Min 2X/week        Progress Toward Goals  OT Goals(current goals can now be found in the care plan section)  Progress towards OT goals: Not progressing toward goals - comment  Acute Rehab OT Goals OT Goal Formulation: With family Time For Goal Achievement: 06/07/22 Potential to Achieve Goals: [place] Discharge plan needs to be updated    Co-evaluation    PT/OT/SLP Co-Evaluation/Treatment: Yes Reason for Co-Treatment: Necessary to address cognition/behavior during functional activity;For patient/therapist safety PT goals addressed during session: Mobility/safety with mobility OT goals addressed during session: ADL's and self-care      AM-PAC OT "6 Clicks" Daily Activity     Outcome Measure   Help from another person eating meals?: Total Help from another person taking care of personal grooming?: Total Help from another person toileting, which includes using toliet, bedpan, or urinal?: Total Help from another person bathing (including washing, rinsing, drying)?: Total Help from another person to put on and taking off regular upper body clothing?: Total Help from another person to put on and taking off regular lower body clothing?: Total 6 Click Score: 6    End of Session    OT Visit Diagnosis: Unsteadiness on feet (R26.81);Other abnormalities of gait and mobility (R26.89);Hemiplegia and hemiparesis;Muscle weakness (generalized) (M62.81);Other symptoms and signs involving cognitive function Hemiplegia - Right/Left: Right Hemiplegia - dominant/non-dominant:  Dominant Hemiplegia - caused by: Cerebral infarction   Activity Tolerance Treatment limited secondary to agitation   Patient Left in bed;with call bell/phone within reach;with bed alarm set   Nurse Communication Mobility status        Time: 7062-3762 OT Time Calculation (min): 18 min  Charges: OT General Charges $OT Visit: 1 Visit  Malka So 05/26/2022, 1:32 PM Cleta Alberts, OTR/L [place] Office: [phone]

## 2022-05-27 DIAGNOSIS — I161 Hypertensive emergency: Secondary | ICD-10-CM | POA: Diagnosis not present

## 2022-05-27 DIAGNOSIS — F0390 Unspecified dementia without behavioral disturbance: Secondary | ICD-10-CM | POA: Diagnosis not present

## 2022-05-27 DIAGNOSIS — Z515 Encounter for palliative care: Secondary | ICD-10-CM | POA: Diagnosis not present

## 2022-05-27 DIAGNOSIS — R4702 Dysphasia: Secondary | ICD-10-CM | POA: Diagnosis not present

## 2022-05-27 DIAGNOSIS — Z66 Do not resuscitate: Secondary | ICD-10-CM

## 2022-05-27 DIAGNOSIS — I10 Essential (primary) hypertension: Secondary | ICD-10-CM | POA: Diagnosis not present

## 2022-05-27 DIAGNOSIS — I639 Cerebral infarction, unspecified: Secondary | ICD-10-CM | POA: Diagnosis not present

## 2022-05-27 DIAGNOSIS — R627 Adult failure to thrive: Secondary | ICD-10-CM

## 2022-05-27 LAB — BASIC METABOLIC PANEL
Anion gap: 12 (ref 5–15)
BUN: 36 mg/dL — ABNORMAL HIGH (ref 8–23)
CO2: 21 mmol/L — ABNORMAL LOW (ref 22–32)
Calcium: 9 mg/dL (ref 8.9–10.3)
Chloride: 107 mmol/L (ref 98–111)
Creatinine, Ser: 1.87 mg/dL — ABNORMAL HIGH (ref 0.61–1.24)
GFR, Estimated: 34 mL/min — ABNORMAL LOW (ref >60)
Glucose, Bld: 107 mg/dL — ABNORMAL HIGH (ref 70–99)
Potassium: 3.6 mmol/L (ref 3.5–5.1)
Sodium: 140 mmol/L (ref 135–145)

## 2022-05-27 MED ORDER — ACETAMINOPHEN 650 MG RE SUPP: 650.0000 mg | Freq: Four times a day (QID) | RECTAL | Status: DC | PRN

## 2022-05-27 MED ORDER — HALOPERIDOL LACTATE 5 MG/ML IJ SOLN: 0.5000 mg | INTRAMUSCULAR | Status: DC | PRN

## 2022-05-27 MED ORDER — METOPROLOL TARTRATE 25 MG PO TABS: 12.5000 mg | ORAL_TABLET | Freq: Two times a day (BID) | ORAL | Status: AC

## 2022-05-27 MED ORDER — OXYCODONE HCL 20 MG/ML PO CONC: 5.0000 mg | ORAL | 0 refills | Status: AC | PRN

## 2022-05-27 MED ORDER — ACETAMINOPHEN 325 MG PO TABS: 650.0000 mg | ORAL_TABLET | Freq: Four times a day (QID) | ORAL | Status: DC | PRN

## 2022-05-27 MED ORDER — ONDANSETRON HCL 4 MG/2ML IJ SOLN: 4.0000 mg | Freq: Four times a day (QID) | INTRAMUSCULAR | Status: DC | PRN

## 2022-05-27 MED ORDER — HALOPERIDOL LACTATE 2 MG/ML PO CONC: 0.5000 mg | ORAL | Status: DC | PRN

## 2022-05-27 MED ORDER — POLYVINYL ALCOHOL 1.4 % OP SOLN: 1.0000 [drp] | Freq: Four times a day (QID) | OPHTHALMIC | Status: DC | PRN

## 2022-05-27 MED ORDER — HALOPERIDOL 0.5 MG PO TABS: 0.5000 mg | ORAL_TABLET | ORAL | Status: DC | PRN

## 2022-05-27 MED ORDER — HYDRALAZINE HCL 20 MG/ML IJ SOLN: 10.0000 mg | Freq: Four times a day (QID) | INTRAMUSCULAR | Status: DC | PRN

## 2022-05-27 MED ORDER — ONDANSETRON 4 MG PO TBDP: 4.0000 mg | ORAL_TABLET | Freq: Four times a day (QID) | ORAL | Status: DC | PRN

## 2022-05-27 MED ORDER — LORAZEPAM 2 MG/ML IJ SOLN
1.0000 mg | INTRAMUSCULAR | Status: DC | PRN
  Administered 2022-05-28: 1 mg via INTRAVENOUS
  Filled 2022-05-27: qty 1

## 2022-05-27 MED ORDER — LORAZEPAM 1 MG PO TABS: 1.0000 mg | ORAL_TABLET | ORAL | Status: DC | PRN

## 2022-05-27 MED ORDER — GLYCOPYRROLATE 1 MG PO TABS: 1.0000 mg | ORAL_TABLET | ORAL | Status: DC | PRN

## 2022-05-27 MED ORDER — LORAZEPAM 1 MG PO TABS: 1.0000 mg | ORAL_TABLET | ORAL | 0 refills | Status: AC | PRN

## 2022-05-27 MED ORDER — BIOTENE DRY MOUTH MT LIQD: 15.0000 mL | OROMUCOSAL | Status: DC | PRN

## 2022-05-27 MED ORDER — GLYCOPYRROLATE 0.2 MG/ML IJ SOLN: 0.2000 mg | INTRAMUSCULAR | Status: DC | PRN

## 2022-05-27 MED ORDER — OXYCODONE HCL 20 MG/ML PO CONC: 5.0000 mg | ORAL | Status: DC | PRN

## 2022-05-27 NOTE — Progress Notes (Signed)
Palliative Medicine Progress Note   Patient Name: [name] [name]       Date: 05/27/2022 DOB: [birth year]  Age: 88 y.o. MRN#: [id number] Attending Physician: Kathie Dike, MD Primary Care Physician: Carollee Herter, Alferd Apa, DO Admit Date: 05/23/2022   HPI/Patient Profile: 88 y.o. male  with past medical history of dementia, glaucoma, hypertension, CKD stage III, CAD, and BPH.  He presented to [facility] ED on 05/23/2022 with right-sided weakness and multiple falls at home.  On arrival he was found to be profoundly hypertensive 232/111 and confused.  Started on nicardipine infusion.  Admitted to PCCM with hypertensive emergency and encephalopathy.  Head CT showed no acute intracranial abnormality, chronic microvascular disease and brain atrophy, and remote right occipital lobe infarct.Follow-up MRI on 9/11 showed acute infarction of the left external capsule. On 9/12, code stroke was called due to increased weakness, slurred speech, and decreased responsiveness. Palliative medicine has been consulted for goals of care.  Subjective: Chart reviewed.  Per SLP note yesterday 9/13, patient did have some instances of delayed coughing with thin liquids and they are still recommending NPO, but allow for PRN floor stock liquids with therapy or nursing only.   I went to see patient and and received an update from his RN. She reports intermittent agitation.  Patient is in bilateral soft mittens.  RN reports it took a very long time to administer patient's medications today.  Patient's wife [name] [name] is at bedside.  Discussed that there had not been significant improvement in patient's condition.  Discussed patient's intermittent agitation and how this can be distressing for him as well as for family.  Reviewed that minimal  po intake is a poor prognostic indicator.  [name] [name] states that she just wants him "comfortable".  Reviewed that comfort care involves stopping full scope medical interventions and allowing the natural course to occur.  Reviewed that the goal is comfort and dignity rather than prolonging life. Discussed comfort care present provided in the hospital, and what that would look like--keeping him clean and dry, no labs, no artificial hydration or feeding, no antibiotics, no cardiac monitoring, minimizing of medications, comfort feeds, and medication to treat symptoms such as pain or agitation.  [name] [name] is agreeable to transition to full comfort care at this time. Emotional support provided.  We also discussed the option to transfer  to a residential hospice facility for a more peaceful setting at end-of-life.  [name] [name] is agreeable and states preference for [facility] although she would consider the facility in [facility] if there is a long wait for [facility].  Plan of care discussed with Dr. Roderic Palau, patient's RN, and TOC.   Physical Exam Vitals reviewed.  Constitutional:      General: He is not in acute distress.    Appearance: He is ill-appearing.     Comments: Thin with muscle wasting  Cardiovascular:     Rate and Rhythm: Normal rate.  Pulmonary:     Effort: Pulmonary effort is normal.  Neurological:     Mental Status: He is alert. He is confused.     Motor: Weakness present.  Psychiatric:        Cognition and Memory: Cognition is impaired.             Vital Signs: BP 101/63   Pulse (!) 56   Temp 98.5 F (36.9 C) (Axillary)   Resp 13   Wt 53.2 kg   SpO2 100%   BMI 17.83 kg/m  SpO2: SpO2: 100 % O2 Device: O2 Device: Room Air O2 Flow Rate: O2 Flow Rate (L/min): 2 L/min     LBM: Last BM Date :  (PTA)     Palliative Assessment/Data: PPS 10-20%     Palliative Medicine Assessment & Plan   Assessment: Principal Problem:   Hypertensive emergency Active Problems:    Hyperlipidemia LDL goal <100   Primary open-angle glaucoma, severe stage   Essential hypertension   Bradycardia   Stroke (HCC)   Hypokalemia   Dementia without behavioral disturbance (HCC)   Depression    Recommendations/Plan: DNR/DNI as previously documented Transition to full comfort care Discontinue labs, cardiac monitor, CBG checks, and minimize medications Added orders for symptom management at end-of-life (see below) Referral to [facility] - discussed with TOC PMT will continue to follow  Symptom Management:  Oxycodone oral soluation prn for pain or dyspnea Lorazepam (ATIVAN) prn for anxiety Haloperidol (HALDOL) prn for agitation  Glycopyrrolate (ROBINUL) for excessive secretions Ondansetron (ZOFRAN) prn for nausea Polyvinyl alcohol (LIQUIFILM TEARS) prn for dry eyes Antiseptic oral rinse (BIOTENE) prn for dry mouth  Prognosis:  < 2 weeks  Discharge Planning: Hospice facility   Thank you for allowing the Palliative Medicine Team to assist in the care of this patient.   MDM - [place]   Lavena Bullion, NP   Please contact Palliative Medicine Team phone at [phone] for questions and concerns.  For individual providers, please see AMION.

## 2022-05-27 NOTE — TOC Progression Note (Signed)
Transition of Care [facility]) - Initial/Assessment Note    Patient Details  Name: [name] [name] MRN: [id number] Date of Birth: December 12, 1931  Transition of [facility]) CM/SW Contact:    Milinda Antis, [place] Phone Number: 05/27/2022, 2:37 PM  Clinical Narrative:                 CSW was informed by palliative NP that patient's family is requesting [facility].  CSW placed referral.   TOC will continue to follow.           Patient Goals and CMS Choice        Expected Discharge Plan and Services                                                Prior Living Arrangements/Services                       Activities of Daily Living      Permission Sought/Granted                  Emotional Assessment              Admission diagnosis:  Toxic encephalopathy [G92.9] Hypertensive crisis [I16.9] Renal insufficiency [N28.9] DDD (degenerative disc disease), cervical [M50.30] Elevated troponin I level [R77.8] Hypertensive emergency [I16.1] Fall, initial encounter [W19.XXXA] Patient Active Problem List   Diagnosis Date Noted   Stroke [facility]) 05/25/2022   Hypokalemia 05/25/2022   Dementia without behavioral disturbance ([place]) 05/25/2022   Depression 05/25/2022   Hypertensive emergency 05/23/2022   PTSD (post-traumatic stress disorder) 04/13/2022   Insomnia due to other mental disorder 04/13/2022   CRF (chronic renal failure), stage 3 (moderate) ([place]) 04/13/2022   Primary open-angle glaucoma (POAG), severe stage 02/23/2022   Malignant hypertension 11/26/2020   Acute encephalopathy 11/25/2020   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 ([place]) 09/26/2020   Lacunar infarct 08/20/2020   Preventative health care 04/01/2015   Bradycardia 04/21/2012   Flatulence, eructation, and gas pain 05/19/2010   Abdominal pain 05/19/2010   Coronary atherosclerosis of native coronary artery 04/07/2010   Elevated PSA  12/06/2009   Benign neoplasm of skin, site unspecified 11/24/2009   Abnormal EKG 04/10/2009   Weight loss observed 06/19/2008   Hip pain, left 04/29/2008   Disorder resulting from impaired renal function 04/07/2007   Primary open-angle glaucoma, severe stage 12/29/2006   Hyperlipidemia LDL goal <100 12/08/2006   Hearing loss 12/08/2006   Essential hypertension 12/08/2006   BPH without obstruction/lower urinary tract symptoms 12/08/2006   PCP:  [name], DO Pharmacy:   [place], [place] - [address] [address] Phone: [phone] Fax: [place], [place] [place] [place] [address] Phone: [phone] Fax: [phone]  [id number] [place]Phone: [phone]: [phone]    Social Determinants of Health (SDOH) Interventions    Readmission Risk Interventions     No data to display

## 2022-05-27 NOTE — Plan of Care (Signed)

## 2022-05-27 NOTE — Discharge Summary (Addendum)
Physician Discharge Summary  GWENDOLYN NISHI BJY:[id number] DOB: [birth year] DOA: 05/23/2022  PCP: [name], DO  Admit date: 05/23/2022 Discharge date: 05/28/2022  Admitted From: Home Disposition: Residential hospice  Recommendations for Outpatient Follow-up:  Patient is being discharged to residential hospice facility for end-of-life care  Discharge Condition: Hospice CODE STATUS: DNR, comfort measures Diet recommendation: Dysphagia 1 diet with thin liquids as tolerated  Brief/Interim Summary: 88 year old male with a history of dementia, CKD stage IIIb, coronary artery disease, BPH, admitted with generalized weakness and severe hypertension.  He was having multiple falls prior to admission.  There was concern that he may have right-sided weakness.  On admission, blood pressure was 232/111 and he was significantly confused.  He was started on nicardipine infusion.  Thereafter, he was started on clonidine, Norvasc and metoprolol.  Imaging of his brain did show that he had left external capsule acute infarct with etiology of small vessel disease.  He was seen by neurology with recommendations for dual antiplatelet therapy for 3 months.  His hospital course has been complicated by poor p.o. intake and overall failure to thrive.  He is being followed by speech, physical and Occupational Therapy.  Only been cleared for dysphagia 1 diet with thin liquids since his p.o. intake has been variable.  Often refuses to eat and drink/pockets food.  After discussing goals of care with the palliative care service, his family has elected to pursue a more comfort care approach and agrees to transition to residential hospice.  Discharge Diagnoses:  Principal Problem:   Hypertensive emergency Active Problems:   Hyperlipidemia LDL goal <100   Primary open-angle glaucoma, severe stage   Essential hypertension   Bradycardia   Stroke ([place])   Hypokalemia   Dementia without behavioral disturbance ([place])    Depression   Hospice care patient    Discharge Instructions  Discharge Instructions     Diet - low sodium heart healthy   Complete by: As directed    Increase activity slowly   Complete by: As directed       Allergies as of 05/27/2022       Reactions   Latanoprost Other (See Comments)   Eye irritation   Netarsudil Other (See Comments)   Eye burning and irritation        Medication List     STOP taking these medications    amLODipine 5 MG tablet Commonly known as: NORVASC   cloNIDine 0.1 MG tablet Commonly known as: CATAPRES   metoprolol succinate 50 MG 24 hr tablet Commonly known as: Toprol XL   QUEtiapine 50 MG tablet Commonly known as: SEROquel   sertraline 50 MG tablet Commonly known as: ZOLOFT       TAKE these medications    acetaminophen 325 MG tablet Commonly known as: Tylenol Take 2 tablets (650 mg total) by mouth every 6 (six) hours as needed for moderate pain.   brimonidine 0.2 % ophthalmic solution Commonly known as: ALPHAGAN Place 1 drop into both eyes 3 (three) times daily.   divalproex 125 MG DR tablet Commonly known as: Depakote Take 1 tablet (125 mg total) by mouth 2 (two) times daily.   dorzolamide-timolol 22.3-6.8 MG/ML ophthalmic solution Commonly known as: COSOPT Place 1 drop into both eyes 2 (two) times daily.   latanoprost 0.005 % ophthalmic solution Commonly known as: XALATAN Place 1 drop into both eyes at bedtime.   LORazepam 1 MG tablet Commonly known as: ATIVAN Place 1 tablet (1 mg total) under the  tongue every 4 (four) hours as needed for anxiety.   metoprolol tartrate 25 MG tablet Commonly known as: LOPRESSOR Take 0.5 tablets (12.5 mg total) by mouth 2 (two) times daily.   oxyCODONE 20 MG/ML concentrated solution Commonly known as: ROXICODONE INTENSOL Place 0.3 mLs (6 mg total) under the tongue every 3 (three) hours as needed for moderate pain (or dyspnea).        Allergies  Allergen Reactions    Latanoprost Other (See Comments)    Eye irritation   Netarsudil Other (See Comments)    Eye burning and irritation    Consultations: PCCM Neurology Palliative care   Procedures/Studies: EEG adult  Result Date: 2022/06/18 Lora Havens, MD     18-Jun-2022  1:25 PM Patient Name: ALEXSANDRO SALEK MRN: [id number] Epilepsy Attending: Lora Havens Referring Physician/Provider: Janine Ores, NP Date: 06-18-2022 Duration: 24.14 mins Patient history: 88 year old patient with history of dementia, glaucoma, HTN, renal insufficiency, CAD and BPH presents with right sided weakness and multiple falls at home along with hypertensive emergency and confusion.  EEG to evaluate for seizure. Level of alertness: Awake AEDs during EEG study: None Technical aspects: This EEG study was done with scalp electrodes positioned according to the 10-20 International system of electrode placement. Electrical activity was reviewed with band pass filter of 1-'70Hz'$ , sensitivity of 7 uV/mm, display speed of 49m/sec with a '60Hz'$  notched filter applied as appropriate. EEG data were recorded continuously and digitally stored.  Video monitoring was available and reviewed as appropriate. Description: The posterior dominant rhythm consists of 8 Hz activity of moderate voltage (25-35 uV) seen predominantly in posterior head regions, symmetric and reactive to eye opening and eye closing. EEG showed intermittent generalized 3 to 6 Hz theta-delta slowing. Hyperventilation and photic stimulation were not performed.   Parts of study were difficult to interpret due to significant myogenic and electrode artifact ABNORMALITY - Intermittent slow, generalized IMPRESSION: This technically difficult study is suggestive of mild diffuse encephalopathy, nonspecific etiology. No seizures or epileptiform discharges were seen throughout the recording. PLora Havens  DG CHEST PORT 1 VIEW  Result Date: 05/29/2022/10/06CLINICAL DATA:  Respiratory  abnormalities, query aspiration EXAM: PORTABLE CHEST 1 VIEW COMPARISON:  05/23/2022 FINDINGS: Cardiomegaly. Both lungs are clear. The visualized skeletal structures are unremarkable. IMPRESSION: Cardiomegaly without acute abnormality of the lungs in AP portable projection. Electronically Signed   By: ADelanna AhmadiM.D.   On: 010-06-202311:18   CT ANGIO HEAD NECK W WO CM (CODE STROKE)  Result Date: 910-06-23CLINICAL DATA:  Neuro deficit, acute, stroke suspected EXAM: CT ANGIOGRAPHY HEAD AND NECK TECHNIQUE: Multidetector CT imaging of the head and neck was performed using the standard protocol during bolus administration of intravenous contrast. Multiplanar CT image reconstructions and MIPs were obtained to evaluate the vascular anatomy. Carotid stenosis measurements (when applicable) are obtained utilizing NASCET criteria, using the distal internal carotid diameter as the denominator. RADIATION DOSE REDUCTION: This exam was performed according to the departmental dose-optimization program which includes automated exposure control, adjustment of the mA and/or kV according to patient size and/or use of iterative reconstruction technique. CONTRAST:  559mOMNIPAQUE IOHEXOL 350 MG/ML SOLN COMPARISON:  Same day CTA FINDINGS: CTA NECK FINDINGS Aortic arch: Great vessel origins are patent without significant stenosis. Aortic atherosclerosis Right carotid system: Atherosclerosis at the carotid bifurcation without greater than 50% stenosis. Left carotid system: Atherosclerosis at the carotid bifurcation approximately 50-60% stenosis the proximal IC Vertebral arteries: Left dominant. No evidence of  significant (greater than 50%) stenosis. Skeleton: Multilevel degenerative change. Other neck: No acute findings. Upper chest: Partially imaged ground-glass opacities lateral aspect of the left upper lobe. Review of the MIP images confirms the above findings CTA HEAD FINDINGS Anterior circulation: Intracranial ICAs are patent  with mild narrowing due to calcific atherosclerosis. Bilateral MCAs and ACAs are patent. Similar moderate left M1 MCA stenosis. Posterior circulation: Bilateral intradural vertebral arteries and basilar artery are patent. Similar multifocal severe stenosis of the right PCA with critical stenosis versus occlusion of the right P2 PCA and distal opacification. Left PCA is patent Venous sinuses: As permitted by contrast timing, patent. Review of the MIP images confirms the above findings IMPRESSION: 1. Similar multifocal severe stenosis of the right PCA with critical stenosis versus occlusion of the right P2 PCA and distal opacification. 2. Similar moderate left M1 MCA stenosis. 3. Similar 50-60% stenosis of the proximal left ICA in the neck. 4. Partially imaged ground-glass opacities lateral aspect of the left upper lobe, potentially infectious/inflammatory. Recommend correlation with dedicated chest imaging. Electronically Signed   By: Margaretha Sheffield M.D.   On: 05/25/2022 10:57   CT HEAD CODE STROKE WO CONTRAST  Result Date: 05/25/2022 CLINICAL DATA:  Code stroke.  Neuro deficit, acute, stroke suspected EXAM: CT HEAD WITHOUT CONTRAST TECHNIQUE: Contiguous axial images were obtained from the base of the skull through the vertex without intravenous contrast. RADIATION DOSE REDUCTION: This exam was performed according to the departmental dose-optimization program which includes automated exposure control, adjustment of the mA and/or kV according to patient size and/or use of iterative reconstruction technique. COMPARISON:  CT head 05/23/22 and MRI head 05/24/22 FINDINGS: Brain: Known acute infarct in the left external capsule and white matter tracks better characterized on recent MRI. No evidence of interval infarct or acute hemorrhage. Remote right occipital infarct. No significant mass effect or midline shift no hydrocephalus. Chronic microvascular ischemic disease and cerebral atrophy Vascular: No hyperdense  vessel. Skull: No acute fracture. Sinuses/Orbits: No acute findings. IMPRESSION: Known acute infarct in the left external capsule and white matter tracks better characterized on recent MRI. No evidence of interval infarct by CT or acute hemorrhage. Code stroke imaging results were communicated on 05/25/2022 at 10:29 am to provider Dr. Reeves Forth Via secure text paging. Electronically Signed   By: Margaretha Sheffield M.D.   On: 05/25/2022 10:30   CT ANGIO HEAD NECK W WO CM  Result Date: 05/25/2022 CLINICAL DATA:  Stroke follow-up EXAM: CT ANGIOGRAPHY HEAD AND NECK TECHNIQUE: Multidetector CT imaging of the head and neck was performed using the standard protocol during bolus administration of intravenous contrast. Multiplanar CT image reconstructions and MIPs were obtained to evaluate the vascular anatomy. Carotid stenosis measurements (when applicable) are obtained utilizing NASCET criteria, using the distal internal carotid diameter as the denominator. RADIATION DOSE REDUCTION: This exam was performed according to the departmental dose-optimization program which includes automated exposure control, adjustment of the mA and/or kV according to patient size and/or use of iterative reconstruction technique. CONTRAST:  28m OMNIPAQUE IOHEXOL 350 MG/ML SOLN COMPARISON:  None Available. FINDINGS: CT HEAD FINDINGS Brain: There is no mass, hemorrhage or extra-axial collection. The size and configuration of the ventricles and extra-axial CSF spaces are normal. There are multiple old infarcts, including a right occipital infarct. There is hypoattenuation of the periventricular white matter, most commonly indicating chronic ischemic microangiopathy. Skull: The visualized skull base, calvarium and extracranial soft tissues are normal. Sinuses/Orbits: No fluid levels or advanced mucosal thickening of the visualized  paranasal sinuses. No mastoid or middle ear effusion. There are bilateral lens replacements. CTA NECK FINDINGS  SKELETON: There is no bony spinal canal stenosis. No lytic or blastic lesion. OTHER NECK: Normal pharynx, larynx and major salivary glands. No cervical lymphadenopathy. Unremarkable thyroid gland. UPPER CHEST: No pneumothorax or pleural effusion. No nodules or masses. AORTIC ARCH: There is no calcific atherosclerosis of the aortic arch. There is no aneurysm, dissection or hemodynamically significant stenosis of the visualized portion of the aorta. Conventional 3 vessel aortic branching pattern. The visualized proximal subclavian arteries are widely patent. RIGHT CAROTID SYSTEM: No dissection, occlusion or aneurysm. Mild atherosclerotic calcification at the carotid bifurcation without hemodynamically significant stenosis. LEFT CAROTID SYSTEM: No dissection, occlusion or aneurysm. There is mixed density atherosclerosis extending into the proximal ICA, resulting in 50% stenosis. VERTEBRAL ARTERIES: Codominant configuration. Both origins are clearly patent. No focal abnormality. CTA HEAD FINDINGS POSTERIOR CIRCULATION: --Vertebral arteries: Normal V4 segments. --Inferior cerebellar arteries: Normal. --Basilar artery: Normal. --Superior cerebellar arteries: Normal. --Posterior cerebral arteries (PCA): Multifocal severe stenosis of the right PCA. There is a critical stenosis versus short segment occlusion of the right P2 segment with maintained distal opacification. The left PCA is patent. ANTERIOR CIRCULATION: --Intracranial internal carotid arteries: Atherosclerotic calcification of the internal carotid arteries at the skull base without hemodynamically significant stenosis. --Anterior cerebral arteries (ACA): Normal. Both A1 segments are present. Patent anterior communicating artery (a-comm). --Middle cerebral arteries (MCA): Normal. VENOUS SINUSES: As permitted by contrast timing, patent. ANATOMIC VARIANTS: None Review of the MIP images confirms the above findings. IMPRESSION: 1. Multifocal severe stenosis of the  right PCA with critical stenosis versus short segment occlusion of the right PCA P2 segment. Distal opacification is maintained. 2. Approximately 50% stenosis of the proximal left internal carotid artery. 3. Multiple old infarcts and chronic ischemic microangiopathy. Electronically Signed   By: Ulyses Jarred M.D.   On: 05/25/2022 02:02   MR BRAIN WO CONTRAST  Result Date: 05/24/2022 CLINICAL DATA:  Stroke. Follow-up. Dementia. Renal disease. Generalized weakness with falling. EXAM: MRI HEAD WITHOUT CONTRAST TECHNIQUE: Multiplanar, multiecho pulse sequences of the brain and surrounding structures were obtained without intravenous contrast. COMPARISON:  Head CT yesterday.  MRI 03/20/2022. FINDINGS: Brain: Diffusion imaging shows acute infarction in the left external capsule and radiating white matter tracts. No other acute insult. No evidence of swelling or hemorrhage. No focal abnormality affects the brainstem or cerebellum. Cerebral hemispheres show generalized atrophy with minimal chronic small-vessel ischemic changes within the white matter and old bilateral occipital infarctions, right larger than left. No mass. No hydrocephalus or extra-axial collection. Vascular: Major vessels at the base of the brain show flow. Skull and upper cervical spine: Negative Sinuses/Orbits: Clear/normal Other: None IMPRESSION: Acute infarction of the left external capsule and radiating white matter tracts. No mass effect or hemorrhage. Old occipital infarctions right larger than left and minimal chronic small-vessel changes of the white matter as seen previously. Electronically Signed   By: Nelson Chimes M.D.   On: 05/24/2022 13:18   ECHOCARDIOGRAM COMPLETE  Result Date: 05/24/2022    ECHOCARDIOGRAM REPORT   Patient Name:   JERRICK FARVE Date of Exam: 05/24/2022 Medical Rec #:  [id number]       Height:       68.0 in Accession #:    [id number]      Weight:       127.4 lb Date of Birth:  1932-02-24        BSA:  1.687 m  Patient Age:    88 years        BP:           164/84 mmHg Patient Gender: M               HR:           50 bpm. Exam Location:  Inpatient Procedure: 2D Echo and Intracardiac Opacification Agent Indications:    Abnormal ECG  History:        Patient has prior history of Echocardiogram examinations, most                 recent 11/26/2020. CAD, Arrythmias:Bradycardia; Risk                 Factors:Hypertension and Dyslipidemia.  Sonographer:    Harvie Junior Referring Phys: [id number] ELIZABETH G MATHEWS  Sonographer Comments: Technically difficult study due to poor echo windows. Image acquisition challenging due to respiratory motion. IMPRESSIONS  1. Left ventricular ejection fraction, by estimation, is 55 to 60%. The left ventricle has normal function. The left ventricle has no regional wall motion abnormalities. 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 Apical septal thickness 20 mm. No evidence of apical thrombus. No evidence of apical aneurysm. No LVOT obstruction. Left ventricular diastolic parameters are consistent with Grade I diastolic dysfunction (impaired relaxation).  2. Right ventricular systolic function is normal. The right ventricular size is normal. There is normal pulmonary artery systolic pressure.  3. The mitral valve is grossly normal. No evidence of mitral valve regurgitation. No evidence of mitral stenosis.  4. The aortic valve is tricuspid. There is mild thickening of the aortic valve. Aortic valve regurgitation is trivial. No aortic stenosis is present.  5. Aortic dilatation noted. There is moderate dilatation of the aortic root, measuring 45 mm. Comparison(s): Prior images reviewed side by side. Increase in maximal LV thickness assessed. Increase in aortic root and Sinus of Valsalva dilation. This study is alos foreshortened. FINDINGS  Left Ventricle: Left ventricular ejection fraction, by estimation, is 55 to 60%. The left  ventricle has normal function. The left ventricle has no regional wall motion abnormalities. Definity contrast agent was given IV to delineate the left ventricular  endocardial borders. The left ventricular internal cavity size was normal in size. There is severe asymmetric left ventricular hypertrophy. Left ventricular diastolic parameters are consistent with Grade I diastolic dysfunction (impaired relaxation). Right Ventricle: The right ventricular size is normal. No increase in right ventricular wall thickness. Right ventricular systolic function is normal. There is normal pulmonary artery systolic pressure. The tricuspid regurgitant velocity is 2.14 m/s, and  with an assumed right atrial pressure of 8 mmHg, the estimated right ventricular systolic pressure is 45.4 mmHg. Left Atrium: Left atrial size was normal in size. Right Atrium: Right atrial size was normal in size. Pericardium: Trivial pericardial effusion is present. Mitral Valve: The mitral valve is grossly normal. Mild mitral annular calcification. No evidence of mitral valve regurgitation. No evidence of mitral valve stenosis. Tricuspid Valve: The tricuspid valve is normal in structure. Tricuspid valve regurgitation is not demonstrated. No evidence of tricuspid stenosis. Aortic Valve: The aortic valve is tricuspid. There is mild thickening of the aortic valve. Aortic valve regurgitation is trivial. Aortic regurgitation PHT measures 923 msec. No aortic stenosis is present. Aortic valve mean gradient measures 2.0 mmHg. Aortic valve peak gradient measures 4.3 mmHg. Aortic valve area, by VTI measures 3.84 cm. Pulmonic Valve: The pulmonic  valve was normal in structure. Pulmonic valve regurgitation is trivial. No evidence of pulmonic stenosis. Aorta: Aortic dilatation noted and the ascending aorta was not well visualized. There is moderate dilatation of the aortic root, measuring 45 mm. IAS/Shunts: No atrial level shunt detected by color flow Doppler.  LEFT  VENTRICLE PLAX 2D LVIDd:         4.60 cm      Diastology LVIDs:         3.50 cm      LV e' medial:    2.72 cm/s LV PW:         1.10 cm      LV E/e' medial:  15.0 LV IVS:        1.40 cm      LV e' lateral:   4.35 cm/s LVOT diam:     2.20 cm      LV E/e' lateral: 9.4 LV SV:         71 LV SV Index:   42 LVOT Area:     3.80 cm  LV Volumes (MOD) LV vol d, MOD A2C: 102.0 ml LV vol d, MOD A4C: 129.0 ml LV vol s, MOD A2C: 44.7 ml LV vol s, MOD A4C: 53.0 ml LV SV MOD A2C:     57.3 ml LV SV MOD A4C:     129.0 ml LV SV MOD BP:      68.2 ml RIGHT VENTRICLE RV S prime:     12.00 cm/s TAPSE (M-mode): 1.9 cm LEFT ATRIUM             Index        RIGHT ATRIUM           Index LA diam:        3.00 cm 1.78 cm/m   RA Area:     11.00 cm LA Vol (A2C):   57.4 ml 34.02 ml/m  RA Volume:   18.10 ml  10.73 ml/m LA Vol (A4C):   41.4 ml 24.53 ml/m LA Biplane Vol: 51.7 ml 30.64 ml/m  AORTIC VALVE                    PULMONIC VALVE AV Area (Vmax):    3.36 cm     PV Vmax:          0.90 m/s AV Area (Vmean):   3.50 cm     PV Peak grad:     3.2 mmHg AV Area (VTI):     3.84 cm     PR End Diast Vel: 5.20 msec AV Vmax:           103.30 cm/s AV Vmean:          60.900 cm/s AV VTI:            0.185 m AV Peak Grad:      4.3 mmHg AV Mean Grad:      2.0 mmHg LVOT Vmax:         91.40 cm/s LVOT Vmean:        56.000 cm/s LVOT VTI:          0.187 m LVOT/AV VTI ratio: 1.01 AI PHT:            923 msec  AORTA Ao Root diam: 4.40 cm Ao Asc diam:  4.50 cm MITRAL VALVE               TRICUSPID VALVE MV Area (PHT): 1.83 cm    TR Peak grad:  18.3 mmHg MV Decel Time: 414 msec    TR Vmax:        214.00 cm/s MR Peak grad: 50.6 mmHg MR Vmax:      355.67 cm/s  SHUNTS MV E velocity: 40.70 cm/s  Systemic VTI:  0.19 m MV A velocity: 79.70 cm/s  Systemic Diam: 2.20 cm MV E/A ratio:  0.51 Rudean Haskell MD Electronically signed by Rudean Haskell MD Signature Date/Time: 05/24/2022/12:49:00 PM    Final    CT Head Wo Contrast  Result Date: 05/23/2022 CLINICAL  DATA:  Multiple falls. Fatigue and weakness. Hypertension. EXAM: CT HEAD WITHOUT CONTRAST CT CERVICAL SPINE WITHOUT CONTRAST TECHNIQUE: Multidetector CT imaging of the head and cervical spine was performed following the standard protocol without intravenous contrast. Multiplanar CT image reconstructions of the cervical spine were also generated. RADIATION DOSE REDUCTION: This exam was performed according to the departmental dose-optimization program which includes automated exposure control, adjustment of the mA and/or kV according to patient size and/or use of iterative reconstruction technique. COMPARISON:  04/05/2022 FINDINGS: CT HEAD FINDINGS Brain: No evidence of acute infarction, hemorrhage, hydrocephalus, extra-axial collection or mass lesion/mass effect. Remote right occipital lobe infarct. There is mild diffuse low-attenuation within the subcortical and periventricular white matter compatible with chronic microvascular disease. Prominence of sulci and ventricles compatible with brain atrophy. Vascular: No hyperdense vessel or unexpected calcification. Skull: Normal. Negative for fracture or focal lesion. Sinuses/Orbits: No acute finding. Other: None. CT CERVICAL SPINE FINDINGS Alignment: Normal alignment. Skull base and vertebrae: The vertebral body heights and disc spaces are well preserved. Facet joints are all aligned. No acute fracture or dislocation. Soft tissues and spinal canal: No prevertebral fluid or swelling. No visible canal hematoma. Disc levels: Disc space narrowing and endplate spurring is identified at C4-5, C5-6 and C6-7. Upper chest: Negative. Other: None IMPRESSION: 1. No acute intracranial abnormality. 2. Chronic microvascular disease and brain atrophy. 3. Remote right occipital lobe infarct. 4. No evidence for cervical spine fracture or dislocation. 5. Cervical degenerative disc disease. Electronically Signed   By: Kerby Moors M.D.   On: 05/23/2022 04:40   CT Cervical Spine Wo  Contrast  Result Date: 05/23/2022 CLINICAL DATA:  Multiple falls. Fatigue and weakness. Hypertension. EXAM: CT HEAD WITHOUT CONTRAST CT CERVICAL SPINE WITHOUT CONTRAST TECHNIQUE: Multidetector CT imaging of the head and cervical spine was performed following the standard protocol without intravenous contrast. Multiplanar CT image reconstructions of the cervical spine were also generated. RADIATION DOSE REDUCTION: This exam was performed according to the departmental dose-optimization program which includes automated exposure control, adjustment of the mA and/or kV according to patient size and/or use of iterative reconstruction technique. COMPARISON:  04/05/2022 FINDINGS: CT HEAD FINDINGS Brain: No evidence of acute infarction, hemorrhage, hydrocephalus, extra-axial collection or mass lesion/mass effect. Remote right occipital lobe infarct. There is mild diffuse low-attenuation within the subcortical and periventricular white matter compatible with chronic microvascular disease. Prominence of sulci and ventricles compatible with brain atrophy. Vascular: No hyperdense vessel or unexpected calcification. Skull: Normal. Negative for fracture or focal lesion. Sinuses/Orbits: No acute finding. Other: None. CT CERVICAL SPINE FINDINGS Alignment: Normal alignment. Skull base and vertebrae: The vertebral body heights and disc spaces are well preserved. Facet joints are all aligned. No acute fracture or dislocation. Soft tissues and spinal canal: No prevertebral fluid or swelling. No visible canal hematoma. Disc levels: Disc space narrowing and endplate spurring is identified at C4-5, C5-6 and C6-7. Upper chest: Negative. Other: None IMPRESSION: 1. No acute intracranial abnormality. 2.  Chronic microvascular disease and brain atrophy. 3. Remote right occipital lobe infarct. 4. No evidence for cervical spine fracture or dislocation. 5. Cervical degenerative disc disease. Electronically Signed   By: Kerby Moors M.D.   On:  05/23/2022 04:40   DG Chest Portable 1 View  Result Date: 05/23/2022 CLINICAL DATA:  Hypertension, weakness EXAM: PORTABLE CHEST 1 VIEW COMPARISON:  06/26/2010 FINDINGS: Left upper lobe scarring. Right lung is clear. No pleural effusion or pneumothorax. The heart is normal in size.  Thoracic aortic atherosclerosis. IMPRESSION: No evidence of acute cardiopulmonary disease. Left upper lobe scarring. Electronically Signed   By: Julian Hy M.D.   On: 05/23/2022 03:39      Subjective: He is confused, sleepy  Discharge Exam: Vitals:   05/27/22 1200 05/27/22 1300 05/27/22 1400 05/27/22 1500  BP: 125/81 127/80 131/79 101/63  Pulse: 62 63 62 (!) 56  Resp: '16 18 16 13  '$ Temp:  98.5 F (36.9 C)    TempSrc:  Axillary    SpO2: 100% 100% 100% 100%  Weight:        General: Pt is alert, awake, not in acute distress Cardiovascular: RRR, S1/S2 +, no rubs, no gallops Respiratory: CTA bilaterally, no wheezing, no rhonchi Abdominal: Soft, NT, ND, bowel sounds + Extremities: no edema, no cyanosis    The results of significant diagnostics from this hospitalization (including imaging, microbiology, ancillary and laboratory) are listed below for reference.     Microbiology: Recent Results (from the past 240 hour(s))  SARS Coronavirus 2 by RT PCR (hospital order, performed in [facility] hospital lab) *cepheid single result test* Anterior Nasal Swab     Status: None   Collection Time: 05/23/22  5:18 AM   Specimen: Anterior Nasal Swab  Result Value Ref Range Status   SARS Coronavirus 2 by RT PCR NEGATIVE NEGATIVE Final    Comment: (NOTE) SARS-CoV-2 target nucleic acids are NOT DETECTED.  The SARS-CoV-2 RNA is generally detectable in upper and lower respiratory specimens during the acute phase of infection. The lowest concentration of SARS-CoV-2 viral copies this assay can detect is 250 copies / mL. A negative result does not preclude SARS-CoV-2 infection and should not be used as the sole  basis for treatment or other patient management decisions.  A negative result may occur with improper specimen collection / handling, submission of specimen other than nasopharyngeal swab, presence of viral mutation(s) within the areas targeted by this assay, and inadequate number of viral copies (<250 copies / mL). A negative result must be combined with clinical observations, patient history, and epidemiological information.  Fact Sheet for Patients:   https://www.patel.info/  Fact Sheet for Healthcare Providers: https://hall.com/  This test is not yet approved or  cleared by the [place] FDA and has been authorized for detection and/or diagnosis of SARS-CoV-2 by FDA under an Emergency Use Authorization (EUA).  This EUA will remain in effect (meaning this test can be used) for the duration of the COVID-19 declaration under Section 564(b)(1) of the Act, 21 U.S.C. section 360bbb-3(b)(1), unless the authorization is terminated or revoked sooner.  Performed at [facility], [place] [address], [place], [place] [address]   MRSA Next Gen by PCR, Nasal     Status: None   Collection Time: 05/23/22  9:20 AM   Specimen: Nasal Mucosa; Nasal Swab  Result Value Ref Range Status   MRSA by PCR Next Gen NOT DETECTED NOT DETECTED Final    Comment: (NOTE) The GeneXpert MRSA Assay (FDA approved  for NASAL specimens only), is one component of a comprehensive MRSA colonization surveillance program. It is not intended to diagnose MRSA infection nor to guide or monitor treatment for MRSA infections. Test performance is not FDA approved in patients less than 63 years old. Performed at [facility], [place] [address], [place], [place] [address]      Labs: BNP (last 3 results) No results for input(s): "BNP" in the last 8760 hours. Basic Metabolic Panel: Recent Labs  Lab 05/23/22 0230 05/23/22 0830 05/24/22 0517 05/25/22 0618 05/27/22 0208   NA 140 139 137 138 140  K 3.6 3.3* 3.4* 4.3 3.6  CL 104 107 108 106 107  CO2 '23 22 22 22 '$ 21*  GLUCOSE 96 134* 82 79 107*  BUN '16 14 13 20 '$ 36*  CREATININE 1.44* 1.20 1.22 1.31* 1.87*  CALCIUM 8.8* 8.6* 8.5* 8.9 9.0  MG  --  1.9 2.0  --   --    Liver Function Tests: Recent Labs  Lab 05/23/22 0230  AST 18  ALT 11  ALKPHOS 46  BILITOT 1.2  PROT 7.1  ALBUMIN 3.4*   No results for input(s): "LIPASE", "AMYLASE" in the last 168 hours. No results for input(s): "AMMONIA" in the last 168 hours. CBC: Recent Labs  Lab 05/23/22 0230 05/23/22 0830 05/24/22 0517 05/25/22 0618  WBC 6.3 14.8* 7.6 9.2  NEUTROABS 4.2  --   --   --   HGB 12.2* 13.4 11.3* 13.5  HCT 36.6* 38.8* 34.0* 38.9*  MCV 92.7 91.3 93.2 90.7  PLT 211 195 163 200   Cardiac Enzymes: No results for input(s): "CKTOTAL", "CKMB", "CKMBINDEX", "TROPONINI" in the last 168 hours. BNP: Invalid input(s): "POCBNP" CBG: Recent Labs  Lab 05/23/22 0624 05/23/22 0751  GLUCAP 120* 135*   D-Dimer No results for input(s): "DDIMER" in the last 72 hours. Hgb A1c No results for input(s): "HGBA1C" in the last 72 hours. Lipid Profile Recent Labs    05/25/22 0618  CHOL 174  HDL 41  LDLCALC 120*  TRIG 66  CHOLHDL 4.2   Thyroid function studies No results for input(s): "TSH", "T4TOTAL", "T3FREE", "THYROIDAB" in the last 72 hours.  Invalid input(s): "FREET3" Anemia work up No results for input(s): "VITAMINB12", "FOLATE", "FERRITIN", "TIBC", "IRON", "RETICCTPCT" in the last 72 hours. Urinalysis    Component Value Date/Time   COLORURINE YELLOW 05/23/2022 0216   APPEARANCEUR CLEAR 05/23/2022 0216   LABSPEC 1.015 05/23/2022 0216   PHURINE 6.0 05/23/2022 0216   GLUCOSEU NEGATIVE 05/23/2022 0216   HGBUR NEGATIVE 05/23/2022 0216   HGBUR negative 05/19/2010 1106   BILIRUBINUR NEGATIVE 05/23/2022 0216   BILIRUBINUR moderate 05/11/2021 1358   KETONESUR NEGATIVE 05/23/2022 0216   PROTEINUR NEGATIVE 05/23/2022 0216    UROBILINOGEN 0.2 05/11/2021 1358   UROBILINOGEN 0.2 05/19/2010 1106   NITRITE NEGATIVE 05/23/2022 0216   LEUKOCYTESUR NEGATIVE 05/23/2022 0216   Sepsis Labs Recent Labs  Lab 05/23/22 0230 05/23/22 0830 05/24/22 0517 05/25/22 0618  WBC 6.3 14.8* 7.6 9.2   Microbiology Recent Results (from the past 240 hour(s))  SARS Coronavirus 2 by RT PCR (hospital order, performed in [facility] hospital lab) *cepheid single result test* Anterior Nasal Swab     Status: None   Collection Time: 05/23/22  5:18 AM   Specimen: Anterior Nasal Swab  Result Value Ref Range Status   SARS Coronavirus 2 by RT PCR NEGATIVE NEGATIVE Final    Comment: (NOTE) SARS-CoV-2 target nucleic acids are NOT DETECTED.  The SARS-CoV-2 RNA is  generally detectable in upper and lower respiratory specimens during the acute phase of infection. The lowest concentration of SARS-CoV-2 viral copies this assay can detect is 250 copies / mL. A negative result does not preclude SARS-CoV-2 infection and should not be used as the sole basis for treatment or other patient management decisions.  A negative result may occur with improper specimen collection / handling, submission of specimen other than nasopharyngeal swab, presence of viral mutation(s) within the areas targeted by this assay, and inadequate number of viral copies (<250 copies / mL). A negative result must be combined with clinical observations, patient history, and epidemiological information.  Fact Sheet for Patients:   https://www.patel.info/  Fact Sheet for Healthcare Providers: https://hall.com/  This test is not yet approved or  cleared by the [place] FDA and has been authorized for detection and/or diagnosis of SARS-CoV-2 by FDA under an Emergency Use Authorization (EUA).  This EUA will remain in effect (meaning this test can be used) for the duration of the COVID-19 declaration under Section 564(b)(1) of the  Act, 21 U.S.C. section 360bbb-3(b)(1), unless the authorization is terminated or revoked sooner.  Performed at [facility], [place] [address], [place], [place] [address]   MRSA Next Gen by PCR, Nasal     Status: None   Collection Time: 05/23/22  9:20 AM   Specimen: Nasal Mucosa; Nasal Swab  Result Value Ref Range Status   MRSA by PCR Next Gen NOT DETECTED NOT DETECTED Final    Comment: (NOTE) The GeneXpert MRSA Assay (FDA approved for NASAL specimens only), is one component of a comprehensive MRSA colonization surveillance program. It is not intended to diagnose MRSA infection nor to guide or monitor treatment for MRSA infections. Test performance is not FDA approved in patients less than 34 years old. Performed at [facility], [place] [address], [place], [place] [address]      Time coordinating discharge: 33mns  SIGNED:   JKathie Dike MD  [facility] 05/27/2022, 6:18 PM   If 7PM-7AM, please contact night-coverage [email or web address]

## 2022-05-27 NOTE — Progress Notes (Addendum)
Speech Language Pathology Treatment: Dysphagia  Patient Details Name: [name] [name] MRN: [id number] DOB: 1932-06-22 Today's Date: 05/27/2022 Time: 1535-1550 SLP Time Calculation (min) (ACUTE ONLY): 15 min  Assessment / Plan / Recommendation Clinical Impression  Patient seen by SLP for skilled treatment session focused on dysphagia goals. Patient was was awake, alert and agreeable to having some sips of Pepsi cola that his wife had brought.  RN reported that patient has been fluctuating between being agreeable to refusing and it took her over two hours to eventually administer his medications. Patient drank thin liquids from straw and although no immediate coughing or throat clearing, he did exhibit an instance of delayed, dry sounding cough. No observed change in vitals. Swallow initiation appeared delayed at times and SLP suspects mildly discoordinated swallow as well. Overall, patient appears to be tolerating thin liquids and suspect he would tolerate puree solids, though he declined this at this time. SLP recommending Dys 1(puree) and thin liquids with expected PO intake to likely be minimal.  Per secure Epic chat with MD, patient's wife has decided on discharge to [facility] with goals of comfort care. SLP will plan to f/u at least  one more time to ensure diet toleration before s/o.   SLP deferring speech-language cognitive evaluation at this time as patient with h/o dementia which per chart review appears to have been progressing. In addition, patient to be discharging to [facility] for comfort care,    HPI HPI: 88 year old male lives at home with his wife admitted with hypertension, generalized weakness but concern for predominant right-sided weakness. MRI acute infarction of the left external capsule and radiating white  matter tracts. Pmhx dementia, CKD stage IIIb, CAD, BPH admitted with generalized weakness.  Family reported multiple falls prior to admission.      SLP Plan  Continue  with current plan of care      Recommendations for follow up therapy are one component of a multi-disciplinary discharge planning process, led by the attending physician.  Recommendations may be updated based on patient status, additional functional criteria and insurance authorization.    Recommendations  Diet recommendations: Dysphagia 1 (puree);Thin liquid Liquids provided via: Cup;Straw Medication Administration: Crushed with puree Supervision: Trained caregiver to feed patient;Staff to assist with self feeding;Full supervision/cueing for compensatory strategies Compensations: Slow rate;Small sips/bites;Minimize environmental distractions Postural Changes and/or Swallow Maneuvers: Seated upright 90 degrees                Oral Care Recommendations: Oral care BID;Staff/trained caregiver to provide oral care Follow Up Recommendations: Follow physician's recommendations for discharge plan and follow up therapies Assistance recommended at discharge: Frequent or constant Supervision/Assistance SLP Visit Diagnosis: Dysphagia, unspecified (R13.10) Plan: Continue with current plan of care           Sonia Baller, MA, CCC-SLP Speech Therapy   05/27/2022, 4:41 PM

## 2022-05-27 NOTE — Plan of Care (Signed)
  Problem: Education: Goal: Knowledge of General Education information will improve Description: Including pain rating scale, medication(s)/side effects and non-pharmacologic comfort measures Outcome: Not Progressing   Problem: Health Behavior/Discharge Planning: Goal: Ability to manage health-related needs will improve Outcome: Not Progressing   Problem: Clinical Measurements: Goal: Ability to maintain clinical measurements within normal limits will improve Outcome: Not Progressing Goal: Will remain free from infection Outcome: Not Progressing Goal: Diagnostic test results will improve Outcome: Not Progressing Goal: Respiratory complications will improve Outcome: Not Progressing Goal: Cardiovascular complication will be avoided Outcome: Not Progressing   Problem: Activity: Goal: Risk for activity intolerance will decrease Outcome: Not Progressing   Problem: Nutrition: Goal: Adequate nutrition will be maintained Outcome: Not Progressing   Problem: Coping: Goal: Level of anxiety will decrease Outcome: Not Progressing   Problem: Elimination: Goal: Will not experience complications related to bowel motility Outcome: Not Progressing Goal: Will not experience complications related to urinary retention Outcome: Not Progressing   Problem: Pain Managment: Goal: General experience of comfort will improve Outcome: Not Progressing   Problem: Safety: Goal: Ability to remain free from injury will improve Outcome: Not Progressing   Problem: Skin Integrity: Goal: Risk for impaired skin integrity will decrease Outcome: Not Progressing   Problem: Safety: Goal: Non-violent Restraint(s) Outcome: Not Progressing   Problem: Education: Goal: Knowledge of the prescribed therapeutic regimen will improve Outcome: Not Progressing   Problem: Coping: Goal: Ability to identify and develop effective coping behavior will improve Outcome: Not Progressing   Problem: Clinical  Measurements: Goal: Quality of life will improve Outcome: Not Progressing   Problem: Respiratory: Goal: Verbalizations of increased ease of respirations will increase Outcome: Not Progressing   Problem: Role Relationship: Goal: Family's ability to cope with current situation will improve Outcome: Not Progressing Goal: Ability to verbalize concerns, feelings, and thoughts to partner or family member will improve Outcome: Not Progressing   Problem: Pain Management: Goal: Satisfaction with pain management regimen will improve Outcome: Not Progressing

## 2022-05-27 NOTE — Progress Notes (Signed)
Pt is refusing to use cpap tonight, Pt states that he does not tolerated it well and will possible give it a try tomorrow night. VS are stable, Pt is no apparent distress at this time. Rt will continue to monitor as needed.

## 2022-05-28 DIAGNOSIS — I161 Hypertensive emergency: Secondary | ICD-10-CM | POA: Diagnosis not present

## 2022-05-28 DIAGNOSIS — R638 Other symptoms and signs concerning food and fluid intake: Secondary | ICD-10-CM | POA: Diagnosis not present

## 2022-05-28 DIAGNOSIS — F0390 Unspecified dementia without behavioral disturbance: Secondary | ICD-10-CM | POA: Diagnosis not present

## 2022-05-28 DIAGNOSIS — I639 Cerebral infarction, unspecified: Secondary | ICD-10-CM | POA: Diagnosis not present

## 2022-05-28 MED ORDER — OXYCODONE HCL 5 MG PO TABS
5.0000 mg | ORAL_TABLET | ORAL | Status: DC | PRN
  Filled 2022-05-28: qty 1

## 2022-05-28 NOTE — TOC Progression Note (Addendum)
Transition of Care [facility]) - Progression Note    Patient Details  Name: [name] [name] MRN: [id number] Date of Birth: [birth year]  Transition of Care [facility]) CM/SW [place], RN Phone Number: 05/28/2022, 10:22 AM  Clinical Narrative:    CM called and spoke with Earnestine Mealing, MSW with [facility] and the facility has bed availability today for admission.  Wh[place], MSW is waiting on completion of admission paperwork at this time.  CM will continue to follow the patient for discharge to [facility] today.  PTAR will be coordinated once admission paperwork at the facility is completed by the family.  05/28/2022 1443 - CM spoke with Earnestine Mealing, MSW with [facility] and the patient has been accepted at the facility and bed was offered.  The patient's wife completed the admission paperwork.  Bedside nursing - Please call [facility] and give report to RN at [phone].  PTAR was arranged for transport to the facility.  PTAr transport packet at [facility] and includes discharge summary, facesheet, Medical necessity, and DNR.    Expected Discharge Plan: [place] Barriers to Discharge: No Barriers Identified  Expected Discharge Plan and Services Expected Discharge Plan: [place]         Expected Discharge Date: 05/27/22                                     Social Determinants of Health (SDOH) Interventions    Readmission Risk Interventions     No data to display

## 2022-05-28 NOTE — Progress Notes (Signed)
Palliative Medicine Progress Note   Patient Name: [name] [name]       Date: 05/28/2022 DOB: 08-28-1932  Age: 88 y.o. MRN#: [id number] Attending Physician: Kathie Dike, MD Primary Care Physician: Carollee Herter, Alferd Apa, DO Admit Date: 05/23/2022  Reason for Consultation/Follow-up: {Reason for Consult:23484}  HPI/Patient Profile: 88 y.o. male  with past medical history of dementia, glaucoma, hypertension, CKD stage III, CAD, and BPH.  He presented to [facility] ED on 05/23/2022 with right-sided weakness and multiple falls at home.  On arrival he was found to be profoundly hypertensive 232/111 and confused.  Started on nicardipine infusion.  Admitted to PCCM with hypertensive emergency and encephalopathy.  Head CT showed no acute intracranial abnormality, chronic microvascular disease and brain atrophy, and remote right occipital lobe infarct.Follow-up MRI on 9/11 showed acute infarction of the left external capsule. On 9/12, code stroke was called due to increased weakness, slurred speech, and decreased responsiveness. Palliative medicine has been consulted for goals of care.  Subjective: ***  Objective:  Physical Exam Vitals reviewed.  Constitutional:      General: He is not in acute distress.    Appearance: He is ill-appearing.     Comments: Thin with muscle wasting  Pulmonary:     Effort: Pulmonary effort is normal.  Neurological:     Mental Status: He is lethargic and confused.     Motor: Weakness present.  Psychiatric:        Cognition and Memory: Cognition is impaired.             Vital Signs: BP (!) 164/97 (BP Location: Right Arm)   Pulse 69   Temp 98 F (36.7 C) (Oral)   Resp 16   Wt 53.2 kg   SpO2 100%   BMI 17.83 kg/m  SpO2: SpO2: 100 % O2 Device: O2 Device: Room Air O2  Flow Rate: O2 Flow Rate (L/min): 2 L/min    LBM: Last BM Date :  (PTA)     Palliative Assessment/Data: ***     Palliative Medicine Assessment & Plan   Assessment: Principal Problem:   Hypertensive emergency Active Problems:   Hyperlipidemia LDL goal <100   Primary open-angle glaucoma, severe stage   Essential hypertension   Bradycardia   Stroke (HCC)   Hypokalemia   Dementia without behavioral disturbance (HCC)  Depression   Hospice care patient    Recommendations/Plan: DNR/DNI as previously documented Continue full comfort measures Place Foley catheter PRN medications are available for symptom management at end-of-life Pending transfer to [facility] today  Symptom Management:  Oxycodone oral soluation prn for pain or dyspnea Lorazepam (ATIVAN) prn for anxiety Haloperidol (HALDOL) prn for agitation  Glycopyrrolate (ROBINUL) for excessive secretions Ondansetron (ZOFRAN) prn for nausea Polyvinyl alcohol (LIQUIFILM TEARS) prn for dry eyes Antiseptic oral rinse (BIOTENE) prn for dry mouth  Goals of Care and Additional Recommendations: Limitations on Scope of Treatment: {Recommended Scope and Preferences:21019}  Code Status:   Prognosis:  {Palliative Care Prognosis:23504}  Discharge Planning: {Palliative dispostion:23505}  Care plan was discussed with ***  Thank you for allowing the Palliative Medicine Team to assist in the care of this patient.   ***   Lavena Bullion, NP   Please contact Palliative Medicine Team phone at [phone] for questions and concerns.  For individual providers, please see AMION.

## 2022-05-28 NOTE — TOC Progression Note (Signed)
Transition of Care [facility]) - Progression Note    Patient Details  Name: [name] [name] MRN: [id number] Date of Birth: [birth year]  Transition of Care [facility]) CM/SW [place], RN Phone Number: 05/28/2022, 9:33 AM  Clinical Narrative:    CM called and left a message with Venia Carbon, RNCM with [facility] to inquire about bed availability at [facility].  CM will continue to follow the patient for placement.        Expected Discharge Plan and Services           Expected Discharge Date: 05/27/22                                     Social Determinants of Health (SDOH) Interventions    Readmission Risk Interventions     No data to display

## 2022-05-28 NOTE — Progress Notes (Signed)
Inpatient Rehab Admissions Coordinator:   Plans to transition to hospice. Will sign off.   Shann Medal, PT, DPT Admissions Coordinator [phone] 05/28/22  11:47 AM

## 2022-05-28 NOTE — Progress Notes (Addendum)
Manufacturing engineer [facility]) Hospital Liaison Note  Referral received for patient/family with interest in [facility]. Chart under review by [facility] physician.   Hospice eligibility confirmed.  Plan is to transfer to [facility] today. Unit RN please call report to [phone] prior to patient leaving the unit. Please send signed DNR and paperwork with patient.   Please leave all IV access and ports in place.    Please call with any questions or concerns. Thank you  Roselee Nova, [facility] Liaison [phone]

## 2022-05-28 NOTE — Progress Notes (Signed)
Patient is to discharge to [facility] today for residential hospice care.  Patient seen and examined.  He does respond to voice.  Denies any complaints.  Appears to be comfortable at this time.  Discharge summary done yesterday.  Please refer to summary for discharge medications and further plan of care.  No changes in condition since patient was evaluated yesterday.  Continue current plan of care.  [employer]

## 2022-05-31 ENCOUNTER — Telehealth: Payer: Self-pay

## 2022-05-31 ENCOUNTER — Telehealth: Payer: Self-pay | Admitting: Family Medicine

## 2022-05-31 NOTE — Telephone Encounter (Signed)
LM requesting call back.  TOC and inquire if needs are met.

## 2022-05-31 NOTE — Telephone Encounter (Signed)
Transition Care Management Unsuccessful Follow-up Telephone Call  Date of discharge and from where:  05/28/22, [facility]   Attempts:  1st Attempt  Reason for unsuccessful TCM follow-up call:  Left voice message  Patient discharged to Hospice.

## 2022-05-31 NOTE — Telephone Encounter (Signed)
Patient's wife called to update [place] on her husband current transition to Hospice.

## 2022-05-31 NOTE — Telephone Encounter (Signed)
FYI

## 2022-06-01 NOTE — Telephone Encounter (Signed)
Transition Care Management Unsuccessful Follow-up Telephone Call  Date of discharge and from where:  05/28/22, [facility]  Attempts:  2nd Attempt  Reason for unsuccessful TCM follow-up call:  Left voice message

## 2022-06-03 NOTE — Telephone Encounter (Signed)
Notification received from [facility], patient passed on 2022-06-19.

## 2022-06-07 ENCOUNTER — Ambulatory Visit: Payer: Federal, State, Local not specified - PPO | Admitting: Family Medicine

## 2022-06-11 ENCOUNTER — Ambulatory Visit: Payer: Federal, State, Local not specified - PPO | Admitting: Physician Assistant

## 2022-06-13 DEATH — deceased

## 2022-08-24 ENCOUNTER — Ambulatory Visit: Payer: Federal, State, Local not specified - PPO | Admitting: Family Medicine
